# Patient Record
Sex: Female | Born: 1965
Health system: Southern US, Community
[De-identification: ages and names within clinical notes are randomized; demographics above are authoritative.]

## PROBLEM LIST (undated history)

## (undated) DIAGNOSIS — F419 Anxiety disorder, unspecified: Secondary | ICD-10-CM

## (undated) DIAGNOSIS — IMO0001 Reserved for inherently not codable concepts without codable children: Secondary | ICD-10-CM

## (undated) DIAGNOSIS — E1143 Type 2 diabetes mellitus with diabetic autonomic (poly)neuropathy: Secondary | ICD-10-CM

## (undated) DIAGNOSIS — Z9889 Other specified postprocedural states: Secondary | ICD-10-CM

## (undated) DIAGNOSIS — Z794 Long term (current) use of insulin: Secondary | ICD-10-CM

## (undated) DIAGNOSIS — K3184 Gastroparesis: Secondary | ICD-10-CM

## (undated) DIAGNOSIS — G629 Polyneuropathy, unspecified: Secondary | ICD-10-CM

## (undated) DIAGNOSIS — E785 Hyperlipidemia, unspecified: Secondary | ICD-10-CM

## (undated) DIAGNOSIS — G459 Transient cerebral ischemic attack, unspecified: Secondary | ICD-10-CM

## (undated) DIAGNOSIS — R51 Headache: Secondary | ICD-10-CM

## (undated) DIAGNOSIS — F17200 Nicotine dependence, unspecified, uncomplicated: Secondary | ICD-10-CM

## (undated) DIAGNOSIS — E119 Type 2 diabetes mellitus without complications: Secondary | ICD-10-CM

## (undated) HISTORY — DX: Reserved for inherently not codable concepts without codable children: IMO0001

## (undated) HISTORY — DX: Type 2 diabetes mellitus without complications: E11.9

## (undated) HISTORY — PX: OTHER SURGICAL HISTORY: SHX169

## (undated) HISTORY — DX: Nicotine dependence, unspecified, uncomplicated: F17.200

## (undated) HISTORY — DX: Gastroparesis: K31.84

## (undated) HISTORY — DX: Long term (current) use of insulin: Z79.4

## (undated) HISTORY — DX: Anxiety disorder, unspecified: F41.9

## (undated) HISTORY — DX: Type 2 diabetes mellitus with diabetic autonomic (poly)neuropathy: E11.43

## (undated) HISTORY — DX: Hyperlipidemia, unspecified: E78.5

## (undated) HISTORY — DX: Other specified postprocedural states: Z98.890

---

## 1999-11-16 ENCOUNTER — Encounter: Payer: Self-pay | Admitting: *Deleted

## 1999-11-16 ENCOUNTER — Encounter: Payer: Self-pay | Admitting: Emergency Medicine

## 2001-03-17 ENCOUNTER — Emergency Department (HOSPITAL_COMMUNITY): Admission: EM | Admit: 2001-03-17 | Discharge: 2001-03-17 | Payer: Self-pay | Admitting: *Deleted

## 2001-03-19 ENCOUNTER — Emergency Department (HOSPITAL_COMMUNITY): Admission: EM | Admit: 2001-03-19 | Discharge: 2001-03-19 | Payer: Self-pay | Admitting: Emergency Medicine

## 2001-12-25 ENCOUNTER — Emergency Department (HOSPITAL_COMMUNITY): Admission: EM | Admit: 2001-12-25 | Discharge: 2001-12-26 | Payer: Self-pay | Admitting: Emergency Medicine

## 2002-06-07 ENCOUNTER — Emergency Department (HOSPITAL_COMMUNITY): Admission: EM | Admit: 2002-06-07 | Discharge: 2002-06-07 | Payer: Self-pay | Admitting: Emergency Medicine

## 2002-06-07 ENCOUNTER — Encounter: Payer: Self-pay | Admitting: Emergency Medicine

## 2002-08-25 ENCOUNTER — Ambulatory Visit (HOSPITAL_COMMUNITY): Admission: RE | Admit: 2002-08-25 | Discharge: 2002-08-25 | Payer: Self-pay | Admitting: Unknown Physician Specialty

## 2002-08-25 ENCOUNTER — Encounter: Payer: Self-pay | Admitting: Family Medicine

## 2003-06-29 ENCOUNTER — Ambulatory Visit (HOSPITAL_COMMUNITY): Admission: RE | Admit: 2003-06-29 | Discharge: 2003-06-29 | Payer: Self-pay | Admitting: Family Medicine

## 2003-06-29 ENCOUNTER — Encounter: Payer: Self-pay | Admitting: Family Medicine

## 2003-12-26 ENCOUNTER — Ambulatory Visit (HOSPITAL_COMMUNITY): Admission: RE | Admit: 2003-12-26 | Discharge: 2003-12-26 | Payer: Self-pay | Admitting: Family Medicine

## 2004-08-29 ENCOUNTER — Ambulatory Visit (HOSPITAL_COMMUNITY): Admission: RE | Admit: 2004-08-29 | Discharge: 2004-08-29 | Payer: Self-pay | Admitting: Family Medicine

## 2005-03-06 ENCOUNTER — Ambulatory Visit: Payer: Self-pay | Admitting: Family Medicine

## 2005-07-24 ENCOUNTER — Ambulatory Visit: Payer: Self-pay | Admitting: Family Medicine

## 2005-07-26 ENCOUNTER — Emergency Department (HOSPITAL_COMMUNITY): Admission: EM | Admit: 2005-07-26 | Discharge: 2005-07-26 | Payer: Self-pay | Admitting: Emergency Medicine

## 2005-08-01 ENCOUNTER — Ambulatory Visit: Payer: Self-pay | Admitting: Family Medicine

## 2005-08-02 ENCOUNTER — Ambulatory Visit: Payer: Self-pay | Admitting: Family Medicine

## 2005-09-30 ENCOUNTER — Ambulatory Visit: Payer: Self-pay | Admitting: Family Medicine

## 2006-01-16 ENCOUNTER — Ambulatory Visit: Payer: Self-pay | Admitting: Family Medicine

## 2006-01-26 ENCOUNTER — Emergency Department (HOSPITAL_COMMUNITY): Admission: EM | Admit: 2006-01-26 | Discharge: 2006-01-26 | Payer: Self-pay | Admitting: Emergency Medicine

## 2006-07-08 ENCOUNTER — Ambulatory Visit: Payer: Self-pay | Admitting: Family Medicine

## 2006-12-01 ENCOUNTER — Ambulatory Visit: Payer: Self-pay | Admitting: Family Medicine

## 2007-04-28 ENCOUNTER — Ambulatory Visit: Payer: Self-pay | Admitting: Family Medicine

## 2007-05-27 ENCOUNTER — Ambulatory Visit: Payer: Self-pay | Admitting: Family Medicine

## 2007-06-05 ENCOUNTER — Ambulatory Visit (HOSPITAL_COMMUNITY): Admission: RE | Admit: 2007-06-05 | Discharge: 2007-06-05 | Payer: Self-pay | Admitting: Family Medicine

## 2007-06-18 ENCOUNTER — Ambulatory Visit: Payer: Self-pay | Admitting: Family Medicine

## 2007-09-24 ENCOUNTER — Ambulatory Visit: Payer: Self-pay | Admitting: Family Medicine

## 2007-10-06 ENCOUNTER — Encounter: Payer: Self-pay | Admitting: Family Medicine

## 2007-10-06 LAB — CONVERTED CEMR LAB
AST: 14 units/L (ref 0–37)
Albumin: 3.9 g/dL (ref 3.5–5.2)
Basophils Absolute: 0.1 10*3/uL (ref 0.0–0.1)
CO2: 26 meq/L (ref 19–32)
Calcium: 9.1 mg/dL (ref 8.4–10.5)
Chloride: 104 meq/L (ref 96–112)
HDL: 67 mg/dL (ref >39)
LDL Cholesterol: 105 mg/dL — ABNORMAL HIGH (ref 0–99)
Lymphocytes Relative: 25 % (ref 12–46)
Neutro Abs: 6.5 10*3/uL (ref 1.7–7.7)
Platelets: 278 10*3/uL (ref 150–400)
RDW: 13.1 % (ref 11.5–15.5)
Sodium: 140 meq/L (ref 135–145)
TSH: 1.164 microintl units/mL (ref 0.350–5.50)
Total Bilirubin: 0.6 mg/dL (ref 0.3–1.2)
Total CHOL/HDL Ratio: 2.7
Total Protein: 6.6 g/dL (ref 6.0–8.3)
Triglycerides: 51 mg/dL (ref <150)

## 2008-01-05 ENCOUNTER — Ambulatory Visit: Payer: Self-pay | Admitting: Family Medicine

## 2008-04-13 DIAGNOSIS — Z794 Long term (current) use of insulin: Secondary | ICD-10-CM

## 2008-04-13 DIAGNOSIS — F17200 Nicotine dependence, unspecified, uncomplicated: Secondary | ICD-10-CM | POA: Insufficient documentation

## 2008-04-13 DIAGNOSIS — IMO0001 Reserved for inherently not codable concepts without codable children: Secondary | ICD-10-CM | POA: Insufficient documentation

## 2008-04-13 DIAGNOSIS — E1165 Type 2 diabetes mellitus with hyperglycemia: Secondary | ICD-10-CM

## 2008-04-13 DIAGNOSIS — F411 Generalized anxiety disorder: Secondary | ICD-10-CM

## 2008-04-13 DIAGNOSIS — E785 Hyperlipidemia, unspecified: Secondary | ICD-10-CM | POA: Insufficient documentation

## 2008-04-19 ENCOUNTER — Encounter: Payer: Self-pay | Admitting: Family Medicine

## 2008-04-19 ENCOUNTER — Ambulatory Visit: Payer: Self-pay | Admitting: Family Medicine

## 2008-04-19 LAB — CONVERTED CEMR LAB: Hgb A1c MFr Bld: 10.3 %

## 2008-06-14 ENCOUNTER — Encounter: Payer: Self-pay | Admitting: Family Medicine

## 2008-06-14 ENCOUNTER — Ambulatory Visit: Payer: Self-pay | Admitting: Family Medicine

## 2008-06-16 DIAGNOSIS — K3184 Gastroparesis: Secondary | ICD-10-CM | POA: Insufficient documentation

## 2008-07-22 ENCOUNTER — Encounter: Payer: Self-pay | Admitting: Family Medicine

## 2008-07-26 ENCOUNTER — Encounter: Payer: Self-pay | Admitting: Family Medicine

## 2008-08-01 ENCOUNTER — Encounter: Payer: Self-pay | Admitting: Family Medicine

## 2008-09-16 ENCOUNTER — Encounter: Payer: Self-pay | Admitting: Family Medicine

## 2008-10-24 ENCOUNTER — Ambulatory Visit: Payer: Self-pay | Admitting: Family Medicine

## 2008-10-24 DIAGNOSIS — Z794 Long term (current) use of insulin: Secondary | ICD-10-CM

## 2008-10-24 DIAGNOSIS — R5382 Chronic fatigue, unspecified: Secondary | ICD-10-CM

## 2008-10-24 DIAGNOSIS — E1140 Type 2 diabetes mellitus with diabetic neuropathy, unspecified: Secondary | ICD-10-CM | POA: Insufficient documentation

## 2008-10-24 DIAGNOSIS — E1165 Type 2 diabetes mellitus with hyperglycemia: Secondary | ICD-10-CM

## 2008-11-21 ENCOUNTER — Telehealth: Payer: Self-pay | Admitting: Family Medicine

## 2008-11-30 ENCOUNTER — Ambulatory Visit: Payer: Self-pay | Admitting: Family Medicine

## 2008-11-30 DIAGNOSIS — R519 Headache, unspecified: Secondary | ICD-10-CM | POA: Insufficient documentation

## 2008-11-30 DIAGNOSIS — R51 Headache: Secondary | ICD-10-CM

## 2008-11-30 LAB — CONVERTED CEMR LAB: Glucose, Bld: 242 mg/dL

## 2009-06-07 ENCOUNTER — Telehealth: Payer: Self-pay | Admitting: Family Medicine

## 2009-06-12 ENCOUNTER — Ambulatory Visit: Payer: Self-pay | Admitting: Family Medicine

## 2009-06-12 LAB — CONVERTED CEMR LAB
Glucose, Bld: 291 mg/dL
Hgb A1c MFr Bld: 10.2 %

## 2009-06-14 ENCOUNTER — Telehealth: Payer: Self-pay | Admitting: Family Medicine

## 2009-06-14 ENCOUNTER — Encounter: Payer: Self-pay | Admitting: Family Medicine

## 2009-06-14 LAB — CONVERTED CEMR LAB: Microalb Creat Ratio: 627.1 mg/g — ABNORMAL HIGH (ref 0.0–30.0)

## 2009-06-16 ENCOUNTER — Telehealth: Payer: Self-pay | Admitting: Family Medicine

## 2009-06-16 DIAGNOSIS — M25519 Pain in unspecified shoulder: Secondary | ICD-10-CM | POA: Insufficient documentation

## 2009-06-20 ENCOUNTER — Telehealth: Payer: Self-pay | Admitting: Family Medicine

## 2009-06-20 ENCOUNTER — Encounter: Payer: Self-pay | Admitting: Family Medicine

## 2009-06-21 LAB — CONVERTED CEMR LAB
ALT: 11 units/L (ref 0–35)
Alkaline Phosphatase: 61 units/L (ref 39–117)
BUN: 13 mg/dL (ref 6–23)
Basophils Absolute: 0.1 10*3/uL (ref 0.0–0.1)
Basophils Relative: 1 % (ref 0–1)
Bilirubin, Direct: 0.1 mg/dL (ref 0.0–0.3)
Cholesterol: 203 mg/dL — ABNORMAL HIGH (ref 0–200)
Creatinine, Ser: 0.78 mg/dL (ref 0.40–1.20)
Eosinophils Absolute: 0.6 10*3/uL (ref 0.0–0.7)
Eosinophils Relative: 9 % — ABNORMAL HIGH (ref 0–5)
Glucose, Bld: 164 mg/dL — ABNORMAL HIGH (ref 70–99)
HCT: 42.6 % (ref 36.0–46.0)
Hemoglobin: 13.9 g/dL (ref 12.0–15.0)
Indirect Bilirubin: 0.3 mg/dL (ref 0.0–0.9)
MCHC: 32.6 g/dL (ref 30.0–36.0)
MCV: 93.8 fL (ref 78.0–100.0)
Monocytes Absolute: 0.7 10*3/uL (ref 0.1–1.0)
Platelets: 264 10*3/uL (ref 150–400)
RDW: 13.4 % (ref 11.5–15.5)
Total Protein: 6.2 g/dL (ref 6.0–8.3)
Triglycerides: 75 mg/dL (ref <150)

## 2009-06-26 ENCOUNTER — Telehealth: Payer: Self-pay | Admitting: Family Medicine

## 2009-07-04 ENCOUNTER — Telehealth: Payer: Self-pay | Admitting: Family Medicine

## 2009-07-19 ENCOUNTER — Encounter: Payer: Self-pay | Admitting: Family Medicine

## 2009-08-21 ENCOUNTER — Telehealth: Payer: Self-pay | Admitting: Family Medicine

## 2009-10-24 ENCOUNTER — Encounter: Payer: Self-pay | Admitting: Family Medicine

## 2009-11-14 ENCOUNTER — Telehealth: Payer: Self-pay | Admitting: Family Medicine

## 2009-11-14 ENCOUNTER — Ambulatory Visit: Payer: Self-pay | Admitting: Family Medicine

## 2009-11-14 DIAGNOSIS — J209 Acute bronchitis, unspecified: Secondary | ICD-10-CM

## 2009-11-14 LAB — CONVERTED CEMR LAB
Glucose, Bld: 345 mg/dL
Hgb A1c MFr Bld: 7.9 %

## 2009-11-21 DIAGNOSIS — J019 Acute sinusitis, unspecified: Secondary | ICD-10-CM | POA: Insufficient documentation

## 2009-11-21 DIAGNOSIS — I10 Essential (primary) hypertension: Secondary | ICD-10-CM | POA: Insufficient documentation

## 2010-04-10 ENCOUNTER — Telehealth: Payer: Self-pay | Admitting: Family Medicine

## 2010-04-12 ENCOUNTER — Encounter: Payer: Self-pay | Admitting: Family Medicine

## 2010-08-09 ENCOUNTER — Emergency Department (HOSPITAL_COMMUNITY): Admission: EM | Admit: 2010-08-09 | Discharge: 2010-08-09 | Payer: Self-pay | Admitting: Emergency Medicine

## 2010-08-09 ENCOUNTER — Encounter: Payer: Self-pay | Admitting: Physician Assistant

## 2010-08-09 ENCOUNTER — Ambulatory Visit: Payer: Self-pay | Admitting: Family Medicine

## 2010-08-09 DIAGNOSIS — E1069 Type 1 diabetes mellitus with other specified complication: Secondary | ICD-10-CM

## 2010-10-10 ENCOUNTER — Telehealth: Payer: Self-pay | Admitting: Family Medicine

## 2010-10-22 ENCOUNTER — Ambulatory Visit: Payer: Self-pay | Admitting: Family Medicine

## 2010-12-01 ENCOUNTER — Encounter: Payer: Self-pay | Admitting: Family Medicine

## 2010-12-02 ENCOUNTER — Encounter: Payer: Self-pay | Admitting: Family Medicine

## 2010-12-03 ENCOUNTER — Encounter: Payer: Self-pay | Admitting: Family Medicine

## 2010-12-10 ENCOUNTER — Telehealth: Payer: Self-pay | Admitting: Family Medicine

## 2010-12-10 ENCOUNTER — Ambulatory Visit
Admission: RE | Admit: 2010-12-10 | Discharge: 2010-12-10 | Payer: Self-pay | Source: Home / Self Care | Attending: Family Medicine | Admitting: Family Medicine

## 2010-12-10 ENCOUNTER — Encounter: Payer: Self-pay | Admitting: Family Medicine

## 2010-12-10 LAB — CONVERTED CEMR LAB
CO2: 28 meq/L (ref 19–32)
Calcium: 8.8 mg/dL (ref 8.4–10.5)
Creatinine, Ser: 0.89 mg/dL (ref 0.40–1.20)
Glucose, Bld: 335 mg/dL
Glucose, Urine, Semiquant: 500
Protein, U semiquant: >=300
Sodium: 137 meq/L (ref 135–145)
Urobilinogen, UA: 1
pH: 5.5

## 2010-12-11 LAB — CONVERTED CEMR LAB
AST: 13 units/L (ref 0–37)
Albumin: 3.5 g/dL (ref 3.5–5.2)
Bilirubin, Direct: 0.1 mg/dL (ref 0.0–0.3)
Creatinine, Urine: 191.4 mg/dL
Eosinophils Relative: 2 % (ref 0–5)
HCT: 42.5 % (ref 36.0–46.0)
HDL: 70 mg/dL (ref >39)
Hemoglobin: 13.8 g/dL (ref 12.0–15.0)
Hgb A1c MFr Bld: 9.6 % — ABNORMAL HIGH (ref <5.7)
LDL Cholesterol: 158 mg/dL — ABNORMAL HIGH (ref 0–99)
Lymphocytes Relative: 22 % (ref 12–46)
Lymphs Abs: 1.8 10*3/uL (ref 0.7–4.0)
Monocytes Absolute: 0.6 10*3/uL (ref 0.1–1.0)
Monocytes Relative: 8 % (ref 3–12)
Neutro Abs: 5.5 10*3/uL (ref 1.7–7.7)
RBC: 4.64 M/uL (ref 3.87–5.11)
TSH: 1.388 microintl units/mL (ref 0.350–4.500)
Total Bilirubin: 0.5 mg/dL (ref 0.3–1.2)
Total CHOL/HDL Ratio: 3.6
VLDL: 21 mg/dL (ref 0–40)

## 2010-12-11 NOTE — Assessment & Plan Note (Signed)
Summary: CONG. LYMPH NODES SWOLLEN IN NECK   Vital Signs:  Patient profile:   45 year old female Height:      66.5 inches Weight:      116.75 pounds BMI:     18.63 O2 Sat:      97 % Pulse rate:   91 / minute Pulse rhythm:   regular Resp:     16 per minute BP sitting:   130 / 84 Cuff size:   small  Vitals Entered By: Everitt Amber (November 14, 2009 2:46 PM) CC: Nose was bleeding the other morning and been having drainage, lymph nodes in throat are swollen Is Patient Diabetic? Yes   Primary Care Provider:  Syliva Overman MD  CC:  Nose was bleeding the other morning and been having drainage and lymph nodes in throat are swollen.  History of Present Illness: Pt reports a 3 week h/o worsening respiratory symptoms, primarily pain and pressure in the face with green nasal drainage, epistaxis and intermittent fever and chills. She also c/o tender swollen neck glands. She states she is doing much better with her diabetes, ating better, taking meds regularly, checking sugars . She still has nt had a mamogram and has cancelled multiple referrals.States sjhe will go this time.  Preventive Screening-Counseling & Management  Alcohol-Tobacco     Smoking Status: current     Smoking Cessation Counseling: yes     Packs/Day: 0.5  Allergies: No Known Drug Allergies  Social History: Packs/Day:  0.5  Review of Systems General:  Complains of chills and fatigue; denies fever. Eyes:  Complains of blurring; denies discharge and red eye. ENT:  Complains of nasal congestion, postnasal drainage, and sinus pressure; 3 week history of head congestion, dizzy, low grade tenmp, states she had alot  of epistaxis from  both nostrils,leftt greater than right. CV:  Denies chest pain or discomfort, palpitations, and swelling of feet. Resp:  Complains of cough and sputum productive; denies shortness of breath and wheezing. GI:  Denies abdominal pain, constipation, diarrhea, nausea, and vomiting. GU:   Denies dysuria and urinary frequency. MS:  Denies joint pain. Neuro:  Denies headaches, seizures, sensation of room spinning, and tingling. Psych:  Complains of anxiety and depression; denies suicidal thoughts/plans, thoughts of violence, unusual visions or sounds, and thoughts /plans of harming others; controlled on meds. Endo:  Complains of excessive hunger; reports improvement in her blood sugars, she has been testing 3 times daily and using her meds a s prescribed by the endocrinologist. She states that the most recent call when she was told that she had kidney damage was a wake up call for her. Heme:  Denies abnormal bruising and bleeding.  Physical Exam  General:  Well-developed,adequately nourished,in no acute distress; alert,appropriate and cooperative throughout examination HEENT: No facial asymmetry,  EOMI, Frontal and maxillary  sinus tenderness, TM's Clear, oropharynx  pink and moist. Bilateral anterior cervical adenitis  Chest:Few scattered crackles, decreased air entry bilaterally CVS: S1, S2, No murmurs, No S3.   Abd: Soft, Nontender.  MS: Adequate ROM spine, hips, shoulders and knees.  Ext: No edema.   CNS: CN 2-12 intact, power tone and sensation normal throughout.   Skin: Intact, no visible lesions or rashes.  Psych: Good eye contact, normal affect.  Memory intact, not anxious or depressed appearing.    Impression & Recommendations:  Problem # 1:  ACUTE BRONCHITIS (ICD-466.0) Assessment Comment Only  Her updated medication list for this problem includes:    Proventil Hfa  108 (90 Base) Mcg/act Aers (Albuterol sulfate) .Marland Kitchen..Marland Kitchen Two puffs every 6 to 8 hours prn    Veetids 500 Mg Tabs (Penicillin v potassium) .Marland Kitchen... Take 1 tablet by mouth three times a day  Orders: Rocephin  250mg  (V7846) Admin of Therapeutic Inj  intramuscular or subcutaneous (96295)  Problem # 2:  SHOULDER PAIN, RIGHT (ICD-719.41) Assessment: Comment Only  Her updated medication list for this  problem includes:    Ibuprofen 800 Mg Tabs (Ibuprofen) .Marland Kitchen... Take 1 tablet by mouth once a day as needed  Problem # 3:  HYPERLIPIDEMIA (ICD-272.4) Assessment: Comment Only  Her updated medication list for this problem includes:    Lovastatin 40 Mg Tabs (Lovastatin) .Marland Kitchen... Take 1 tab by mouth at bedtime  Orders: T-Lipid Profile [phone]) T-Hepatic Function [phone])  Labs Reviewed: SGOT: 18 (06/20/2009)   SGPT: 11 (06/20/2009)   HDL:69 (06/20/2009), 67 (10/06/2007)  LDL:119 (06/20/2009), 105 (10/06/2007)  Chol:203 (06/20/2009), 182 (10/06/2007)  Trig:75 (06/20/2009), 51 (10/06/2007)  Problem # 4:  DIABETES MELLITUS, WITH RENAL COMPLICATIONS (ICD-250.40) Assessment: Improved  Her updated medication list for this problem includes:    Novolog 100 Unit/ml Soln (Insulin aspart) .Marland Kitchen... For sliding scale coverage for breakfast and lunch only    Humalog Mix 75/25 Kwikpen 75-25 % Susp (Insulin lispro prot & lispro) .Marland KitchenMarland KitchenMarland KitchenMarland Kitchen 35 units bid  Orders: Glucose, (CBG) (82962) Hemoglobin A1C (83036)  Labs Reviewed: Creat: 0.78 (06/20/2009)    Reviewed HgBA1c results: 7.9 (11/14/2009)  10.2 (06/12/2009)  Problem # 5:  NICOTINE ADDICTION (ICD-305.1) Assessment: Improved  Encouraged smoking cessation and discussed different methods for smoking cessation. currently smoking 10/day and wants to quit  Problem # 6:  GENERALIZED ANXIETY DISORDER (ICD-300.02) Assessment: Improved  Her updated medication list for this problem includes:    Klonopin 0.5 Mg Tabs (Clonazepam) .Marland Kitchen... Take 1 tab by mouth at bedtime    Cymbalta 60 Mg Cpep (Duloxetine hcl) ..... One tab by mouth bid  Problem # 7:  HYPERTENSION (ICD-401.9) Assessment: Comment Only  BP today: 130/84 Prior BP: 90/60 (06/12/2009)  Labs Reviewed: K+: 4.5 (06/20/2009) Creat: : 0.78 (06/20/2009)   Chol: 203 (06/20/2009)   HDL: 69 (06/20/2009)   LDL: 119 (06/20/2009)   TG: 75 (06/20/2009) currently on an ACE inhibitor, she is to continue  same  Problem # 8:  HYPERLIPIDEMIA (ICD-272.4)  Her updated medication list for this problem includes:    Lovastatin 40 Mg Tabs (Lovastatin) .Marland Kitchen... Take 1 tab by mouth at bedtime  Orders: T-Lipid Profile ([id number]) T-Hepatic Function [phone])  Complete Medication List: 1)  Klonopin 0.5 Mg Tabs (Clonazepam) .... Take 1 tab by mouth at bedtime 2)  Metanx 2.8-25-2 Mg Tabs (L-methylfolate-b6-b12) .... One tab by mouth qd 3)  Cymbalta 60 Mg Cpep (Duloxetine hcl) .... One tab by mouth bid 4)  Onetouch Test Strp (Glucose blood) .... Uad 5)  Test Strips For One Touch Ultra Mini  6)  Novolog 100 Unit/ml Soln (Insulin aspart) .... For sliding scale coverage for breakfast and lunch only 7)  Proventil Hfa 108 (90 Base) Mcg/act Aers (Albuterol sulfate) .... Two puffs every 6 to 8 hours prn 8)  Bd Ultra-fine Pen Needles 29g X 12.48mm Misc (Insulin pen needle) .... Uad 9)  Thinpro Insulin Syringe 31g X 3/8" 1 Ml Misc (Insulin syringe-needle u-100) .... Uad 10)  Humalog Mix 75/25 Kwikpen 75-25 % Susp (Insulin lispro prot & lispro) .... 35 units bid 11)  Ibuprofen 800 Mg Tabs (Ibuprofen) .... Take 1 tablet by mouth once a day as needed  12)  Lovastatin 40 Mg Tabs (Lovastatin) .... Take 1 tab by mouth at bedtime 13)  Veetids 500 Mg Tabs (Penicillin v potassium) .... Take 1 tablet by mouth three times a day  Other Orders: T-Basic Metabolic Panel [phone]) T- Hemoglobin A1C ([id number]) Radiology Referral (Radiology)  Patient Instructions: 1)  Please schedule a follow-up appointment in 4 months. 2)  Tobacco is very bad for your health and your loved ones! You Should stop smoking!. 3)  Stop Smoking Tips: Choose a Quit date. Cut down before the Quit date. decide what you will do as a substitute when you feel the urge to smoke(gum,toothpick,exercise). 4)  you are being treated for sinusitis. you will get an injection of rocephin and meds are being sent in also. 5)  i am very proud of your   blood sugar. 6)  mAMO you will also do!!! Prescriptions: VEETIDS 500 MG TABS (PENICILLIN V POTASSIUM) Take 1 tablet by mouth three times a day  #30 x 0   Entered and Authorized by:   Syliva Overman MD   Signed by:   Syliva Overman MD on 11/14/2009   Method used:   Electronically to        [facility] [address] [phone]* (retail)       [address] [place], [place]  [address]       Ph: [phone]       Fax: [phone]   RxID:   [id number]   Laboratory Results   Blood Tests   Date/Time Received: November 14, 2009  Date/Time Reported: November 14, 2009   Glucose (random): 345 mg/dL   (Normal Range: 59-563) HGBA1C: 7.9%   (Normal Range: Non-Diabetic - 3-6%   Control Diabetic - 6-8%)      Medication Administration  Injection # 1:    Medication: Rocephin  250mg     Diagnosis: ACUTE BRONCHITIS (ICD-466.0)    Route: IM    Site: RUOQ gluteus    Exp Date: 4/10    Lot #: [id number]    Mfr:  [email or web address]    Comments: rocephin 500mg  given    Patient tolerated injection without complications    Given by: Worthy Keeler LPN (November 14, 2009 4:16 PM)  Orders Added: 1)  Glucose, (CBG) [82962] 2)  Hemoglobin A1C [83036] 3)  T-Basic Metabolic Panel [[id number]] 4)  T-Lipid Profile [80061-22930] 5)  T-Hepatic Function [80076-22960] 6)  T- Hemoglobin A1C [83036-23375] 7)  Radiology Referral [Radiology] 8)  Rocephin  250mg  [J0696] 9)  Admin of Therapeutic Inj  intramuscular or subcutaneous [96372] 10)  Est. Patient Level V [32951]  Appended Document: CONG. LYMPH NODES SWOLLEN IN NECK coded at a level 5 since late so chg will driop down to a level 4 which is the correct level, thanks

## 2010-12-11 NOTE — Letter (Signed)
Summary: Out of Work  [facility]  [address]   [place], [place] [address]   Phone: [phone]  Fax: [phone]    November 14, 2009   Employee:  [employer]    To Whom It May Concern:   For Medical reasons, please excuse the above named employee from work for the following dates:  Start:   11/14/09  End:   11/15/09 to return with no restrictions  If you need additional information, please feel free to contact our office.         Sincerely,    Worthy Keeler LPN

## 2010-12-11 NOTE — Assessment & Plan Note (Signed)
Summary: sugar bottomed out- room 3   Vital Signs:  Patient profile:   45 year old female Height:      66.5 inches Weight:      120.25 pounds BMI:     19.19 O2 Sat:      100 % on Room air Pulse rate:   82 / minute Resp:     16 per minute BP sitting:   110 / 62  (left arm)  Vitals Entered By: Adella Hare LPN (August 09, 2010 11:26 AM) CC: patient states sugar dropped to 46 this morning at work, had episode of shakes, sweats, disoriented Is Patient Diabetic? Yes Did you bring your meter with you today? No Pain Assessment Patient in pain? no      CBG Result 225 Comments did not bring meds to ov   Primary Provider:  Syliva Overman MD  CC:  patient states sugar dropped to 46 this morning at work, had episode of shakes, sweats, and disoriented.  History of Present Illness: Pt states she had an episode of hypoglycemia this am at work.  See's endocrinologist for diabetes.  Uses Lantus 15 units q am, and sliding scale Novolog.  States has been having problems with blood sugars dropping at work.  "I do alot of physical work."  She has discussed this with her endocrinologist.  FBS avg 160s and evening sugars at bedtime avg 180s.  Does sometimes awaken during night low too. Pt states when her blood sugars started to come back up she was feeling dizzy and is worried that she has an ear infection.  See's endocrinologist every 3 mos.  Next appt in October.    Allergies (verified): No Known Drug Allergies  Past History:  Past medical history reviewed for relevance to current acute and chronic problems.  Past Medical History: Reviewed history from 10/24/2008 and no changes required.   NICOTINE ADDICTION (ICD-305.1) GENERALIZED ANXIETY DISORDER (ICD-300.02) IDDM (ICD-250.01)  with retinopathy. DYSLIPIDEMIA (ICD-272.4)  Review of Systems General:  Denies chills and fever. ENT:  Denies earache, nasal congestion, sinus pressure, and sore throat. CV:  Denies chest pain or  discomfort. Resp:  Denies cough and shortness of breath. GI:  Denies abdominal pain, diarrhea, and vomiting. GU:  Denies dysuria and urinary frequency.  Physical Exam  General:  Well-developed,well-nourished,in no acute distress; alert,appropriate and cooperative throughout examination Head:  Normocephalic and atraumatic without obvious abnormalities. No apparent alopecia or balding. Eyes:  pupils equal, pupils round, pupils reactive to light, and no injection.   Ears:  External ear exam shows no significant lesions or deformities.  Otoscopic examination reveals clear canals, tympanic membranes are intact bilaterally without bulging, retraction, inflammation or discharge. Hearing is grossly normal bilaterally. Nose:  External nasal examination shows no deformity or inflammation. Nasal mucosa are pink and moist without lesions or exudates. Mouth:  Oral mucosa and oropharynx without lesions or exudates.  Teeth in good repair. Neck:  No deformities, masses, or tenderness noted. Lungs:  Normal respiratory effort, chest expands symmetrically. Lungs are clear to auscultation, no crackles or wheezes. Heart:  Normal rate and regular rhythm. S1 and S2 normal without gallop, murmur, click, rub or other extra sounds. Cervical Nodes:  No lymphadenopathy noted Psych:  Oriented X3, memory intact for recent and remote, normally interactive, good eye contact, not anxious appearing, and not depressed appearing.     Impression & Recommendations:  Problem # 1:  DIABETIC HYPOGLYCEMIA, TYPE I (ICD-250.81) Assessment Comment Only Advised pt that she needs to f/u with  her endocrinologist regarding her frequent hypoglycemic episodes at work.  Her updated medication list for this problem includes:    Novolog 100 Unit/ml Soln (Insulin aspart) .Marland Kitchen... For sliding scale coverage for breakfast and lunch only    Humalog Mix 75/25 Kwikpen 75-25 % Susp (Insulin lispro prot & lispro) .Marland KitchenMarland KitchenMarland KitchenMarland Kitchen 35 units bid  Problem # 2:   HYPERTENSION (ICD-401.9) Assessment: Comment Only  BP today: 110/62 Prior BP: 130/84 (11/14/2009)  Labs Reviewed: K+: 4.5 (06/20/2009) Creat: : 0.78 (06/20/2009)   Chol: 203 (06/20/2009)   HDL: 69 (06/20/2009)   LDL: 119 (06/20/2009)   TG: 75 (06/20/2009)  Complete Medication List: 1)  Klonopin 0.5 Mg Tabs (Clonazepam) .... Take 1 tab by mouth at bedtime 2)  Metanx 2.8-25-2 Mg Tabs (L-methylfolate-b6-b12) .... One tab by mouth qd 3)  Cymbalta 60 Mg Cpep (Duloxetine hcl) .... One tab by mouth bid 4)  Onetouch Test Strp (Glucose blood) .... Uad 5)  Test Strips For One Touch Ultra Mini  6)  Novolog 100 Unit/ml Soln (Insulin aspart) .... For sliding scale coverage for breakfast and lunch only 7)  Proventil Hfa 108 (90 Base) Mcg/act Aers (Albuterol sulfate) .... Two puffs every 6 to 8 hours prn 8)  Bd Ultra-fine Pen Needles 29g X 12.18mm Misc (Insulin pen needle) .... Uad 9)  Thinpro Insulin Syringe 31g X 3/8" 1 Ml Misc (Insulin syringe-needle u-100) .... Uad 10)  Humalog Mix 75/25 Kwikpen 75-25 % Susp (Insulin lispro prot & lispro) .... 35 units bid 11)  Ibuprofen 800 Mg Tabs (Ibuprofen) .... Take 1 tablet by mouth once a day as needed 12)  Lovastatin 40 Mg Tabs (Lovastatin) .... Take 1 tab by mouth at bedtime 13)  Veetids 500 Mg Tabs (Penicillin v potassium) .... Take 1 tablet by mouth three times a day  Other Orders: Glucose, (CBG) ([address]) Influenza Vaccine NON MCR (60454)  Patient Instructions: 1)  Follow up with your endocrinologist as planned. 2)  After drinking soda to bring your sugar up, eat some protein to help keep you from dropping again. 3)  You have received you flu shot today.  Laboratory Results   Blood Tests   Date/Time Received: August 09, 2010 11:28 AM  Date/Time Reported: August 09, 2010 11:28 AM   CBG Random:: 225       Influenza Vaccine    Vaccine Type: Fluvax Non-MCR    Site: right deltoid    Mfr: [email or web address]    Dose: 0.5 ml    Route: IM     Given by: Adella Hare LPN    Exp. Date: 03/2011    Lot #: [id number]    VIS given: 06/05/10 version given August 09, 2010.

## 2010-12-11 NOTE — Progress Notes (Signed)
Summary: please advise  Phone Note Call from Patient   Summary of Call: patient called in states her sugars are out of wack, and she is congestive and just not feeling good, she wanted to know if you would call in antibiotic for her, I told her I didn't think so since it has been several months since you seen her.   She wanted to know what she take for the congestion. Initial call taken by: Curtis Sites,  October 10, 2010 3:36 PM  Follow-up for Phone Call        advise sugar free/diabeti robitussin, offer morning appt Follow-up by: Syliva Overman MD,  October 10, 2010 6:12 PM  Additional Follow-up for Phone Call Additional follow up Details #1::        called patient, left message Additional Follow-up by: Adella Hare LPN,  October 12, 2010 4:18 PM     Appended Document: please advise patient states she is feeling alot better

## 2010-12-11 NOTE — Progress Notes (Signed)
  Phone Note Call from Patient   Summary of Call: Needs paper stating what her copay amount is and that Dr. Lodema Hong is her primary care doctor.  Call when ready  Initial call taken by: Everitt Amber LPN,  Apr 10, 2010 3:41 PM  Follow-up for Phone Call        Brandi gave pt profee billings number Follow-up by: Rudene Anda,  April 11, 2010 10:33 AM  Additional Follow-up for Phone Call Additional follow up Details #1::        Does she need anything further from [place]? Additional Follow-up by: Esperanza Sheets PA,  April 11, 2010 11:27 AM    Additional Follow-up for Phone Call Additional follow up Details #2::    Daughter is coming to collect paper staing what her copay is and that Lodema Hong is her PCP Follow-up by: Everitt Amber LPN,  April 11, 1609 11:54 AM

## 2010-12-11 NOTE — Progress Notes (Signed)
Summary: referral  Phone Note Outgoing Call   Call placed by: breast center  Action Taken: Appt scheduled Summary of Call: Called and made appt for pt to go lto breast center for 11/21/2009 3:50. Breast Center let me know that pt had 9 no shows. Just wanted to let doc know Initial call taken by: Rudene Anda,  November 14, 2009 3:46 PM  Follow-up for Phone Call        noted!! Follow-up by: Syliva Overman MD,  November 14, 2009 4:10 PM

## 2010-12-11 NOTE — Letter (Signed)
Summary: Work Excuse  [facility]  [address]   [place], [place] [address]   Phone: [phone]  Fax: [phone]    Today's Date: August 09, 2010  Name of Patient: Ashley Armstrong  The above named patient had a medical visit today at:  9:45 am .  Please take this into consideration when reviewing the time away from work/school.    Special Instructions:  [  ] None  [ * ] To be off the remainder of today, returning to the normal work / school schedule tomorrow.  [  ] To be off until the next scheduled appointment on ______________________.  [  ] Other ________________________________________________________________ ________________________________________________________________________   Sincerely yours,   Esperanza Sheets PA

## 2010-12-11 NOTE — Letter (Signed)
Summary: co pay  co pay   Imported By: Lind Guest 04/12/2010 09:33:44  _____________________________________________________________________  External Attachment:    Type:   Image     Comment:   External Document

## 2010-12-13 NOTE — Letter (Signed)
Summary: 1ST MISSED LETTER  1ST MISSED LETTER   Imported By: Lind Guest 10/23/2010 09:14:58  _____________________________________________________________________  External Attachment:    Type:   Image     Comment:   External Document

## 2010-12-19 NOTE — Letter (Signed)
Summary: [facility]  [facility]   Imported By: Lind Guest 12/12/2010 10:39:32  _____________________________________________________________________  External Attachment:    Type:   Image     Comment:   External Document

## 2010-12-19 NOTE — Progress Notes (Signed)
Summary: lab work  Phone Note Call from Patient   Summary of Call: called and wanted to know about her lab work and Dr. Lodema Hong said it was fine and she said she would call back tomorrow and speak to nurse or you could call her back on her cell Initial call taken by: Lind Guest,  December 10, 2010 4:59 PM  Follow-up for Phone Call        pls let pt know blood sugar is uncontrolled, giver her the values , also her cholesterol is too high , she needs to start med for this and follow a low fat diet, also inc the humalog to 40 units twice daily, pls sen in historic scripts Follow-up by: Syliva Overman MD,  December 11, 2010 12:28 PM  Additional Follow-up for Phone Call Additional follow up Details #1::        patient is only taking novolog and lantus not on humalog    Additional Follow-up for Phone Call Additional follow up Details #2::    she has a sliding scale on the novolog through her endo, she needs to make appt withendo and have them amnage the sliding scale , I do not do that Follow-up by: Syliva Overman MD,  December 11, 2010 1:55 PM  Additional Follow-up for Phone Call Additional follow up Details #3:: Details for Additional Follow-up Action Taken: called patient, left message Additional Follow-up by: Adella Hare LPN,  December 11, 2010 3:07 PM  Prescriptions: PRAVASTATIN SODIUM 80 MG TABS (PRAVASTATIN SODIUM) Take 1 tab by mouth at bedtime  #30 x 4   Entered by:   Adella Hare LPN   Authorized by:   Syliva Overman MD   Signed by:   Adella Hare LPN on 16/08/9603   Method used:   Electronically to        [facility] [address] [phone]* (retail)       [address] [place], [place]  [address]       Ph: [phone]       Fax: [phone]   RxID:   [id number]  patient aware Adella Hare LPN  December 12, 2010 10:36 AM

## 2010-12-19 NOTE — Assessment & Plan Note (Signed)
Summary: VOMITING/DIABETIC/SLJ   Vital Signs:  Patient profile:   45 year old female Height:      66.5 inches Weight:      118.25 pounds BMI:     18.87 O2 Sat:      98 % Pulse rate:   87 / minute Pulse rhythm:   regular Resp:     16 per minute BP sitting:   102 / 60  (left arm)  Vitals Entered By: Everitt Amber LPN (December 10, 2010 9:31 AM) CC: was throwing up over the weekend/diarrhea, bad headache, no appetite. Eyesight is off making her feel nauseated still   Primary Care Provider:  Syliva Overman MD  CC:  was throwing up over the weekend/diarrhea, bad headache, and no appetite. Eyesight is off making her feel nauseated still.  History of Present Illness: Ptn was in her usual state of health up until 3 days ago, she started experiencing severe midline splitting headache rated at a 10 with a burning sensation, vomitting std 2 days later. headache was like a migraine which she has had in the past, bC powder helped this, sleep afforded relief. At times she felt as though she was "in and out " of it.She did have an aura of a metal taste in her mouth before the headache which cleared yesterday. Pt still is experiencincing no apetitie, nausea abd had 4 loose stools yesterday, no other sick contact with diarreah. Has lower abd pain, spotting last week  Preventive Screening-Counseling & Management  Alcohol-Tobacco     Smoking Cessation Counseling: yes  Allergies: No Known Drug Allergies  Review of Systems      See HPI General:  Complains of chills and fatigue. Eyes:  Complains of vision loss-both eyes. ENT:  Denies hoarseness, nasal congestion, and postnasal drainage. CV:  Denies chest pain or discomfort, difficulty breathing while lying down, palpitations, and swelling of feet. Resp:  Denies cough and sputum productive. GU:  Denies dysuria. MS:  Denies joint pain and stiffness. Derm:  Complains of lesion(s). Psych:  Complains of anxiety, irritability, and mental problems;  denies suicidal thoughts/plans, thoughts of violence, and unusual visions or sounds; reports max stress wants therapy. Endo:  Complains of excessive thirst and excessive urination; widely fluctuating blood sugars, inconsitent testing. Heme:  Denies abnormal bruising and bleeding. Allergy:  Denies hives or rash, itching eyes, and seasonal allergies.  Physical Exam  General:  Pleasant female,adequately-nourished,in no acute distress; alert,appropriate and cooperative throughout examination. Chronically ill appearing HEENT: No facial asymmetry,  EOMI, No sinus tenderness, TM's Clear, oropharynx  pink and moist.   Chest: Clear to auscultation bilaterally.  CVS: S1, S2, No murmurs, No S3.   Abd: Soft, diffuse superficial tenderness, and hyperactive bowel sounds MS: Adequate ROM spine, hips, shoulders and knees.  Ext: No edema.   CNS: CN 2-12 intact, power tone and sensation normal throughout.   Skin: Intact, no visible lesions or rashes.  Psych: Good eye contact, normal affect.  Memory intact, not anxious or depressed appearing.    Impression & Recommendations:  Problem # 1:  GENERALIZED ANXIETY DISORDER (ICD-300.02) Assessment Deteriorated referral to therapy  Problem # 2:  HEADACHE (ICD-784.0) Assessment: Deteriorated  Her updated medication list for this problem includes:    Ibuprofen 800 Mg Tabs (Ibuprofen) .Marland Kitchen... Take 1 tablet by mouth once a day as needed  Problem # 3:  DIABETES MELLITUS, WITH RENAL COMPLICATIONS (ICD-250.40) Assessment: Deteriorated  The following medications were removed from the medication list:    Humalog Mix  75/25 Kwikpen 75-25 % Susp (Insulin lispro prot & lispro) .Marland KitchenMarland KitchenMarland KitchenMarland Kitchen 35 units bid Her updated medication list for this problem includes:    Novolog 100 Unit/ml Soln (Insulin aspart) .Marland Kitchen... For sliding scale coverage for breakfast and lunch only    Humalog Mix 75/25 Kwikpen 75-25 % Susp (Insulin lispro prot & lispro) .Marland KitchenMarland KitchenMarland KitchenMarland Kitchen 40 units twice daily, dose inc  effective 12/11/2010  Labs Reviewed: Creat: 0.78 (06/20/2009)    Reviewed HgBA1c results: 7.9 (11/14/2009)  10.2 (06/12/2009)  Problem # 4:  DYSLIPIDEMIA (ICD-272.4) Assessment: Comment Only  The following medications were removed from the medication list:    Lovastatin 40 Mg Tabs (Lovastatin) .Marland Kitchen... Take 1 tab by mouth at bedtime Her updated medication list for this problem includes:    Pravastatin Sodium 80 Mg Tabs (Pravastatin sodium) .Marland Kitchen... Take 1 tab by mouth at bedtime Low fat dietdiscussed and encouraged  Labs Reviewed: SGOT: 18 (06/20/2009)   SGPT: 11 (06/20/2009)   HDL:69 (06/20/2009), 67 (10/06/2007)  LDL:119 (06/20/2009), 105 (10/06/2007)  Chol:203 (06/20/2009), 182 (10/06/2007)  Trig:75 (06/20/2009), 51 (10/06/2007)  Complete Medication List: 1)  Klonopin 0.5 Mg Tabs (Clonazepam) .... Take 1 tab by mouth at bedtime 2)  Metanx 2.8-25-2 Mg Tabs (L-methylfolate-b6-b12) .... One tab by mouth qd 3)  Cymbalta 60 Mg Cpep (Duloxetine hcl) .... One tab by mouth bid 4)  Onetouch Test Strp (Glucose blood) .... Uad 5)  Test Strips For One Touch Ultra Mini  6)  Novolog 100 Unit/ml Soln (Insulin aspart) .... For sliding scale coverage for breakfast and lunch only 7)  Proventil Hfa 108 (90 Base) Mcg/act Aers (Albuterol sulfate) .... Two puffs every 6 to 8 hours prn 8)  Bd Ultra-fine Pen Needles 29g X 12.59mm Misc (Insulin pen needle) .... Uad 9)  Thinpro Insulin Syringe 31g X 3/8" 1 Ml Misc (Insulin syringe-needle u-100) .... Uad 10)  Ibuprofen 800 Mg Tabs (Ibuprofen) .... Take 1 tablet by mouth once a day as needed 11)  Veetids 500 Mg Tabs (Penicillin v potassium) .... Take 1 tablet by mouth three times a day 12)  Pravastatin Sodium 80 Mg Tabs (Pravastatin sodium) .... Take 1 tab by mouth at bedtime 13)  Humalog Mix 75/25 Kwikpen 75-25 % Susp (Insulin lispro prot & lispro) .... 40 units twice daily, dose inc effective 12/11/2010  Other Orders: T-Basic Metabolic Panel  ([id number]) T-Hepatic Function [phone]) T-Lipid Profile [phone]) T-CBC w/Diff [phone]) T-TSH [phone]) T- Hemoglobin A1C (16606-30160) T-Urine Microalbumin w/creat. ratio [phone]) Urinalysis (81003-65000) Glucose, (CBG) (54270) Psychology Referral (Psychology)  Patient Instructions: 1)  Please schedule a follow-up appointment in 1 month. 2)  BMP prior to visit, ICD-9: 3)  Hepatic Panel prior to visit, ICD-9: 4)  Lipid Panel prior to visit, ICD-9:  today. 5)  TSH prior to visit, ICD-9: 6)  CBC w/ Diff prior to visit, ICD-9: 7)  HbgA1C prior to visit, ICD-9: 8)  microalb from the office today  and cCUA today 9)  Schedule your mammogram. 10)  pls keep appt with the endocrinologist 11)  You will be referred to behav health for therapy 12)  Oral Rehydration Solution: drink 1/2 ounce every 15 minutes. If tolerated afert 1 hour, drink 1 ounce every 15 minutes. As you can tolerate, keep adding 1/2 ounce every 15 minutes, up to a total of 2-4 ounces. Contact the office if unable to tolerate oral solution, if you keep vomiting, or you continue to have signs of dehydration. 13)  Tobacco is very bad for your health and your loved ones! You  Should stop smoking!. 14)  Stop Smoking Tips: Choose a Quit date. Cut down before the Quit date. decide what you will do as a substitute when you feel the urge to smoke(gum,toothpick,exercise). Prescriptions: HUMALOG MIX 75/25 KWIKPEN 75-25 % SUSP (INSULIN LISPRO PROT & LISPRO) 40 units twice daily, dose inc effective 12/11/2010  #2400 units x 3   Entered and Authorized by:   Syliva Overman MD   Signed by:   Syliva Overman MD on 12/11/2010   Method used:   Historical   RxID:   [id number] PRAVASTATIN SODIUM 80 MG TABS (PRAVASTATIN SODIUM) Take 1 tab by mouth at bedtime  #30 x 4   Entered and Authorized by:   Syliva Overman MD   Signed by:   Syliva Overman MD on 12/11/2010   Method used:   Historical   RxID:    [id number]    Orders Added: 1)  Est. Patient Level IV [40102] 2)  T-Basic Metabolic Panel [[id number]] 3)  T-Hepatic Function [47425-95638] 4)  T-Lipid Profile [80061-22930] 5)  T-CBC w/Diff [75643-32951] 6)  T-TSH [88416-60630] 7)  T- Hemoglobin A1C [83036-23375] 8)  T-Urine Microalbumin w/creat. ratio [82043-[address]] 9)  Urinalysis [81003-65000] 10)  Glucose, (CBG) [82962] 11)  Psychology Referral [Psychology]    Laboratory Results   Urine Tests  Date/Time Received: December 10, 2010 3:49 PM  Date/Time Reported: December 10, 2010 3:49 PM   Routine Urinalysis   Color: yellow Appearance: Clear Glucose: 500   (Normal Range: Negative) Bilirubin: small   (Normal Range: Negative) Ketone: smal (15)   (Normal Range: Negative) Spec. Gravity: >=1.030   (Normal Range: 1.003-1.035) Blood: moderate   (Normal Range: Negative) pH: 5.5   (Normal Range: 5.0-8.0) Protein: >=300   (Normal Range: Negative) Urobilinogen: 1.0   (Normal Range: 0-1) Nitrite: negative   (Normal Range: Negative) Leukocyte Esterace: negative   (Normal Range: Negative)     Blood Tests   Date/Time Received: December 10, 2010 3:50 PM  Date/Time Reported: December 10, 2010 3:50 PM   Glucose (random): 335 mg/dL   (Normal Range: 16-010)

## 2010-12-19 NOTE — Letter (Signed)
Summary: Out of Work  [facility]  [address]   [place], [place] [address]   Phone: [phone]  Fax: [phone]    December 10, 2010   Employee:  Ashley Armstrong    To Whom It May Concern:   For Medical reasons, please excuse the above named employee from work for the following dates:  Start:   12/10/10  End:   12/12/10 to return with no restrictions  If you need additional information, please feel free to contact our office.         Sincerely,    Milus Mallick. Lodema Hong, MD

## 2011-01-10 ENCOUNTER — Ambulatory Visit: Payer: Self-pay | Admitting: Family Medicine

## 2011-01-24 LAB — GLUCOSE, CAPILLARY: Glucose-Capillary: 75 mg/dL (ref 70–99)

## 2011-06-10 ENCOUNTER — Telehealth: Payer: Self-pay | Admitting: Family Medicine

## 2011-06-10 NOTE — Telephone Encounter (Signed)
Noted will write what I can

## 2011-06-13 ENCOUNTER — Telehealth: Payer: Self-pay | Admitting: Family Medicine

## 2011-06-14 NOTE — Telephone Encounter (Signed)
She said that she could buy them from 2-4 dollars

## 2011-06-27 ENCOUNTER — Telehealth: Payer: Self-pay | Admitting: Family Medicine

## 2011-06-28 NOTE — Telephone Encounter (Signed)
7 times daily testing?

## 2011-07-01 NOTE — Telephone Encounter (Signed)
I do not direct 7 times daily testing, maybe endo she has to get that order from endo. Max 1  Will direct is 4 times daily

## 2011-07-02 NOTE — Telephone Encounter (Signed)
Patient is no longer seeing endo because she has lost her job. Will have pharmacy fax request for approval.

## 2011-07-03 ENCOUNTER — Ambulatory Visit: Payer: Self-pay | Admitting: Family Medicine

## 2011-09-13 ENCOUNTER — Ambulatory Visit: Payer: Self-pay | Admitting: Family Medicine

## 2011-10-29 ENCOUNTER — Encounter (HOSPITAL_COMMUNITY): Payer: Self-pay | Admitting: *Deleted

## 2011-10-29 ENCOUNTER — Emergency Department (HOSPITAL_COMMUNITY)
Admission: EM | Admit: 2011-10-29 | Discharge: 2011-10-29 | Disposition: A | Discharge status: Home / Self Care | Payer: Self-pay | Attending: Emergency Medicine | Admitting: Emergency Medicine

## 2011-10-29 DIAGNOSIS — R112 Nausea with vomiting, unspecified: Secondary | ICD-10-CM | POA: Insufficient documentation

## 2011-10-29 DIAGNOSIS — Z794 Long term (current) use of insulin: Secondary | ICD-10-CM | POA: Insufficient documentation

## 2011-10-29 DIAGNOSIS — F411 Generalized anxiety disorder: Secondary | ICD-10-CM | POA: Insufficient documentation

## 2011-10-29 DIAGNOSIS — E785 Hyperlipidemia, unspecified: Secondary | ICD-10-CM | POA: Insufficient documentation

## 2011-10-29 DIAGNOSIS — R51 Headache: Secondary | ICD-10-CM | POA: Insufficient documentation

## 2011-10-29 HISTORY — DX: Headache: R51

## 2011-10-29 LAB — POCT I-STAT, CHEM 8
BUN: 22 mg/dL (ref 6–23)
Calcium, Ion: 1.25 mmol/L (ref 1.12–1.32)
Chloride: 106 mEq/L (ref 96–112)
HCT: 40 % (ref 36.0–46.0)
Potassium: 5 mEq/L (ref 3.5–5.1)
Sodium: 137 mEq/L (ref 135–145)

## 2011-10-29 MED ORDER — METOCLOPRAMIDE HCL 5 MG/ML IJ SOLN
10.0000 mg | Freq: Once | INTRAMUSCULAR | Status: AC
  Administered 2011-10-29: 10 mg via INTRAVENOUS
  Filled 2011-10-29 (×2): qty 2

## 2011-10-29 MED ORDER — KETOROLAC TROMETHAMINE 30 MG/ML IJ SOLN: 30.0000 mg | Freq: Once | INTRAMUSCULAR | Status: DC

## 2011-10-29 MED ORDER — SODIUM CHLORIDE 0.9 % IV SOLN
INTRAVENOUS | Status: DC
  Administered 2011-10-29: 14:00:00 via INTRAVENOUS

## 2011-10-29 MED ORDER — DIPHENHYDRAMINE HCL 50 MG/ML IJ SOLN
50.0000 mg | Freq: Once | INTRAMUSCULAR | Status: AC
  Administered 2011-10-29: 50 mg via INTRAVENOUS
  Filled 2011-10-29 (×2): qty 1

## 2011-10-29 NOTE — ED Notes (Signed)
Pt taking fluids without difficulty, states she feels better.

## 2011-10-29 NOTE — ED Provider Notes (Signed)
History     CSN: [id number] Arrival date & time: 10/29/2011 12:09 PM   Chief Complaint  Patient presents with  . Emesis    HPI Pt was seen at 1330.  Per pt, c/o gradual onset and persistence of constant acute flair of her chronic migraine headache since this morning.  Describes the headache as per her usual chronic migraine headache pain pattern for the past several years.  Has been eval by her PMD previously for same.  States she usually has her migraine "when I get stressed."  Has been assoc with several episodes of N/V.  Headache is located in her forehead and bitemporal areas.  Denies headache was sudden or maximal in onset or at any time.  Denies visual changes, no focal motor weakness, no tingling/numbness in extremities, no fevers, no neck pain, no rash.    PMD:  Dr. Lodema Hong Past Medical History  Diagnosis Date  . Nicotine addiction   . Anxiety   . IDDM (insulin dependent diabetes mellitus)   . Dyslipidemia   . Headache     Past Surgical History  Procedure Date  . Laser left eye Oct 01, 2008    4 treatment on left eye and 2 on the right     Family History  Problem Relation Age of Onset  . Hypertension Mother   . Diabetes Mother   . Hyperlipidemia Mother   . Rosacea Mother   . Hypertension Father     History  Substance Use Topics  . Smoking status: Former Games developer  . Smokeless tobacco: Not on file  . Alcohol Use: No    Review of Systems ROS: Statement: All systems negative except as marked or noted in the HPI; Constitutional: Negative for fever and chills. ; ; Eyes: Negative for eye pain, redness and discharge. ; ; ENMT: Negative for ear pain, hoarseness, nasal congestion, sinus pressure and sore throat. ; ; Cardiovascular: Negative for chest pain, palpitations, diaphoresis, dyspnea and peripheral edema. ; ; Respiratory: Negative for cough, wheezing and stridor. ; ; Gastrointestinal: +N/V.  Negative for diarrhea, abdominal pain, blood in stool, hematemesis, jaundice  and rectal bleeding. . ; ; Genitourinary: Negative for dysuria, flank pain and hematuria. ; ; Musculoskeletal: Negative for back pain and neck pain. Negative for swelling and trauma.; ; Skin: Negative for pruritus, rash, abrasions, blisters, bruising and skin lesion.; ; Neuro: +headache.  Negative for lightheadedness and neck stiffness. Negative for weakness, altered level of consciousness , altered mental status, extremity weakness, paresthesias, involuntary movement, seizure and syncope.     Allergies  Review of patient's allergies indicates no known allergies.  Home Medications   Current Outpatient Rx  Name Route Sig Dispense Refill  . DULOXETINE HCL 60 MG PO CPEP Oral Take 60 mg by mouth 2 (two) times daily. One tablet by mouth two times a day     . INSULIN ASPART 100 UNIT/ML [place] SOLN Subcutaneous Inject 10 Units into the skin 3 (three) times daily before meals. For sliding scale coverage for breakfast and lunch only    . INSULIN GLARGINE 100 UNIT/ML [place] SOLN Subcutaneous Inject 10-25 Units into the skin 2 (two) times daily. 25 units in the morning and 10 units at night.       BP 144/77  Pulse 94  Temp(Src) 98 F (36.7 C) (Oral)  Resp 18  Ht 5\' 7"  (1.702 m)  Wt 120 lb (54.432 kg)  BMI 18.79 kg/m2  SpO2 100%  LMP 09/29/2011  Physical Exam 1335: Physical examination:  Nursing notes reviewed; Vital signs and O2 SAT reviewed;  Constitutional: Well developed, Well nourished, Well hydrated, In no acute distress; Head:  Normocephalic, atraumatic; Eyes: EOMI, PERRL, No scleral icterus; ENMT: Mouth and pharynx normal, Mucous membranes moist; Neck: Supple, Full range of motion, No lymphadenopathy, no meningeal signs; Cardiovascular: Regular rate and rhythm, No murmur, rub, or gallop; Respiratory: Breath sounds clear & equal bilaterally, No rales, rhonchi, wheezes, or rub, Normal respiratory effort/excursion; Chest: Nontender, Movement normal; Abdomen: Soft, Nontender, Nondistended, Normal bowel  sounds; Genitourinary: No CVA tenderness; Extremities: Pulses normal, No tenderness, No edema, No calf edema or asymmetry.; Neuro: AA&Ox3, Major CN grossly intact. No facial droop, speech clear.  Normal coordination. No gross focal motor or sensory deficits in extremities.; Skin: Color normal, Warm, Dry, no rash.    ED Course  Procedures    MDM  MDM Reviewed: nursing note, vitals and previous chart Interpretation: labs   Results for orders placed during the hospital encounter of 10/29/11  POCT I-STAT, CHEM 8      Component Value Range   Sodium 137  135 - 145 (mEq/L)   Potassium 5.0  3.5 - 5.1 (mEq/L)   Chloride 106  96 - 112 (mEq/L)   BUN 22  6 - 23 (mg/dL)   Creatinine, Ser 1.61  0.50 - 1.10 (mg/dL)   Glucose, Bld 096 (*) 70 - 99 (mg/dL)   Calcium, Ion 0.45  4.09 - 1.32 (mmol/L)   TCO2 27  0 - 100 (mmol/L)   Hemoglobin 13.6  12.0 - 15.0 (g/dL)   HCT 81.1  91.4 - 78.2 (%)    3:22 PM:  States she "feels better now" and wants to go home.  Has tol PO well while in ED without N/V.  Neuro exam unchanged.  Known DM, not acidotic today.  Dx testing d/w pt.  Questions answered.  Verb understanding, agreeable to d/c home with outpt f/u.     Yoshito Gaza Allison Quarry, DO 10/30/11 1157

## 2011-10-29 NOTE — ED Notes (Signed)
Vomiting since this am and headache. Also c/o feeling hot and sweaty. Denies diarrhea.

## 2012-03-05 ENCOUNTER — Telehealth: Payer: Self-pay | Admitting: Family Medicine

## 2012-03-05 NOTE — Telephone Encounter (Signed)
Ok to send D/C to the pharmacy for this?

## 2012-03-06 NOTE — Telephone Encounter (Signed)
Called patient and left message for them to return call at the office   

## 2012-03-06 NOTE — Telephone Encounter (Signed)
pls verify that she gets all care from the health dept now. If so I am going to d/c all medication, explain this to her, and send in the d/c order for all of her meds to the pharmacy. Also explain she needs to get the provider who is seeing her to get the free meds which I had been signing for, until she returns here, this is the safest way to handle prescription medication.  Pls send me back a message to state done when this has been done completely or if there are problems

## 2012-03-24 ENCOUNTER — Ambulatory Visit: Payer: Self-pay | Admitting: Family Medicine

## 2012-04-20 ENCOUNTER — Ambulatory Visit: Payer: Self-pay | Admitting: Family Medicine

## 2012-06-01 ENCOUNTER — Telehealth: Payer: Self-pay | Admitting: Family Medicine

## 2012-06-01 DIAGNOSIS — I10 Essential (primary) hypertension: Secondary | ICD-10-CM

## 2012-06-01 DIAGNOSIS — E1129 Type 2 diabetes mellitus with other diabetic kidney complication: Secondary | ICD-10-CM

## 2012-06-01 NOTE — Telephone Encounter (Signed)
pls let Ashley Armstrong understand she need lab work HBA1C and chem 7 at least before I can safely sign for her medication, tha t will do for now, so order lab non fasting, she will be billed by lab, but nEEDS lab work

## 2012-06-02 ENCOUNTER — Telehealth: Payer: Self-pay | Admitting: Family Medicine

## 2012-06-04 NOTE — Telephone Encounter (Signed)
Labs ordered and voicemail left for pt to return call to the office.

## 2012-06-04 NOTE — Telephone Encounter (Signed)
Called patient and left message for them to return call at the office   

## 2012-06-08 LAB — BASIC METABOLIC PANEL
CO2: 28 mEq/L (ref 19–32)
Calcium: 9.5 mg/dL (ref 8.4–10.5)
Chloride: 104 mEq/L (ref 96–112)
Potassium: 4.6 mEq/L (ref 3.5–5.3)
Sodium: 138 mEq/L (ref 135–145)

## 2012-06-08 NOTE — Telephone Encounter (Signed)
pls see tele message of 7/22

## 2012-06-08 NOTE — Telephone Encounter (Signed)
Noted  

## 2012-06-08 NOTE — Telephone Encounter (Signed)
Pt is aware that she needs to complete bloodwork before the papers will be completed. Pt in today to get lab order.

## 2012-06-09 ENCOUNTER — Other Ambulatory Visit: Payer: Self-pay | Admitting: Family Medicine

## 2012-06-09 MED ORDER — INSULIN GLARGINE 100 UNIT/ML ~~LOC~~ SOLN: 50.0000 [IU] | Freq: Every day | SUBCUTANEOUS | Status: DC

## 2012-06-09 MED ORDER — INSULIN ASPART 100 UNIT/ML ~~LOC~~ SOLN: 10.0000 [IU] | Freq: Three times a day (TID) | SUBCUTANEOUS | Status: DC

## 2012-06-12 ENCOUNTER — Other Ambulatory Visit: Payer: Self-pay

## 2012-06-12 MED ORDER — INSULIN ASPART 100 UNIT/ML ~~LOC~~ SOLN: 10.0000 [IU] | Freq: Three times a day (TID) | SUBCUTANEOUS | Status: DC

## 2012-06-12 MED ORDER — INSULIN GLARGINE 100 UNIT/ML ~~LOC~~ SOLN: 50.0000 [IU] | Freq: Every day | SUBCUTANEOUS | Status: DC

## 2012-06-17 ENCOUNTER — Other Ambulatory Visit: Payer: Self-pay | Admitting: Family Medicine

## 2012-06-17 ENCOUNTER — Telehealth: Payer: Self-pay

## 2012-06-17 NOTE — Telephone Encounter (Signed)
Called Sonja back and left message to call back

## 2012-06-17 NOTE — Telephone Encounter (Signed)
Sonja aware of the new change that agrees with the way [place] has been taking it

## 2012-06-17 NOTE — Telephone Encounter (Signed)
Change to 50 units twice daily, let her knowpls

## 2012-06-17 NOTE — Telephone Encounter (Signed)
Ashley Armstrong called and stated that she received an rx for lantus from you for 50 units at bedtime but Ashley Armstrong states that she had been taking 45 units bid. Since her a1c had went up she needed to go 50 units bid. But on her medlist and what you just sent in said 50 units AT BEDTIME. Please clarify so I can let pt and Sonja know

## 2013-03-19 ENCOUNTER — Encounter: Payer: Self-pay | Admitting: Family Medicine

## 2013-03-19 ENCOUNTER — Ambulatory Visit (INDEPENDENT_AMBULATORY_CARE_PROVIDER_SITE_OTHER): Payer: BC Managed Care – PPO | Admitting: Family Medicine

## 2013-03-19 ENCOUNTER — Other Ambulatory Visit: Payer: Self-pay | Admitting: Family Medicine

## 2013-03-19 VITALS — BP 98/70 | HR 88 | Resp 16 | Ht 68.0 in | Wt 119.0 lb

## 2013-03-19 DIAGNOSIS — IMO0001 Reserved for inherently not codable concepts without codable children: Secondary | ICD-10-CM

## 2013-03-19 DIAGNOSIS — M79602 Pain in left arm: Secondary | ICD-10-CM | POA: Insufficient documentation

## 2013-03-19 DIAGNOSIS — Z139 Encounter for screening, unspecified: Secondary | ICD-10-CM

## 2013-03-19 DIAGNOSIS — E1065 Type 1 diabetes mellitus with hyperglycemia: Secondary | ICD-10-CM

## 2013-03-19 DIAGNOSIS — F411 Generalized anxiety disorder: Secondary | ICD-10-CM

## 2013-03-19 DIAGNOSIS — E559 Vitamin D deficiency, unspecified: Secondary | ICD-10-CM

## 2013-03-19 DIAGNOSIS — B351 Tinea unguium: Secondary | ICD-10-CM

## 2013-03-19 DIAGNOSIS — E785 Hyperlipidemia, unspecified: Secondary | ICD-10-CM

## 2013-03-19 DIAGNOSIS — M79609 Pain in unspecified limb: Secondary | ICD-10-CM

## 2013-03-19 DIAGNOSIS — Z23 Encounter for immunization: Secondary | ICD-10-CM

## 2013-03-19 DIAGNOSIS — R5383 Other fatigue: Secondary | ICD-10-CM

## 2013-03-19 DIAGNOSIS — R5381 Other malaise: Secondary | ICD-10-CM

## 2013-03-19 DIAGNOSIS — IMO0002 Reserved for concepts with insufficient information to code with codable children: Secondary | ICD-10-CM

## 2013-03-19 LAB — TSH: TSH: 1.853 u[IU]/mL (ref 0.350–4.500)

## 2013-03-19 LAB — COMPREHENSIVE METABOLIC PANEL
BUN: 23 mg/dL (ref 6–23)
CO2: 26 mEq/L (ref 19–32)
Calcium: 9.6 mg/dL (ref 8.4–10.5)
Chloride: 106 mEq/L (ref 96–112)
Creat: 1.33 mg/dL — ABNORMAL HIGH (ref 0.50–1.10)

## 2013-03-19 LAB — CBC WITH DIFFERENTIAL/PLATELET
Eosinophils Relative: 1 % (ref 0–5)
Hemoglobin: 13.9 g/dL (ref 12.0–15.0)
Lymphocytes Relative: 31 % (ref 12–46)
Lymphs Abs: 2.9 10*3/uL (ref 0.7–4.0)
MCV: 85.4 fL (ref 78.0–100.0)
Monocytes Relative: 7 % (ref 3–12)
Platelets: 290 10*3/uL (ref 150–400)
RBC: 4.79 MIL/uL (ref 3.87–5.11)
WBC: 9.3 10*3/uL (ref 4.0–10.5)

## 2013-03-19 LAB — LIPID PANEL
Cholesterol: 258 mg/dL — ABNORMAL HIGH (ref 0–200)
HDL: 69 mg/dL (ref >39)
Triglycerides: 100 mg/dL (ref <150)
VLDL: 20 mg/dL (ref 0–40)

## 2013-03-19 LAB — HEMOGLOBIN A1C: Hgb A1c MFr Bld: 9.9 % — ABNORMAL HIGH (ref <5.7)

## 2013-03-19 LAB — MICROALBUMIN / CREATININE URINE RATIO
Creatinine, Urine: 266.2 mg/dL
Microalb, Ur: 197.66 mg/dL — ABNORMAL HIGH (ref 0.00–1.89)

## 2013-03-19 NOTE — Patient Instructions (Signed)
F/u in 4 month, call if you need me before  You are referred to endocrinologist  Microalb today also Pneumonia vaccine  Labs today, cbc, HBA1C, lipid, cmp, TSH , vit D  Medication will be sent in for fungal toenail and skin infection after labs are reviewed  Please sign for record from eye specialist, last 3 visits  Start aspirin 81 mg one daily

## 2013-03-19 NOTE — Progress Notes (Signed)
  Subjective:    Patient ID: Ashley Armstrong, female    DOB: [birth year], 47 y.o.   MRN: [id number]  HPI Pt in today stating she is now looking for disability, has significant vision impairment, has had multiple treatments to both eyes, getting injections and has had laser surgery, marked  Improvement in vision reportedly. Has also had nerve and kidney damage due to uncontrolled diabetes. Initially diagnosed with diabetes at age 20, haws been uncontrolled  For at least 20 years C/o left elbow swelling and tenderness with reduced ROM, pain radiates to back of neck, states she feels a "pop" in the shoulder blade, symptoms have been going on for just over 1 year Both ankles have cramps at night, states she has to walk to relieve her symptoms Quit smoking last year Blood sugars continue to fluctuate dramatically and remain uncontrolled. Eating is erratic, she recognizes that this is a problem   Review of Systems See HPI Denies recent fever or chills. Denies sinus pressure, nasal congestion, ear pain or sore throat. Denies chest congestion, productive cough or wheezing. Denies chest pains, palpitations and leg swelling Denies abdominal pain, nausea, vomiting,diarrhea or constipation.   Denies dysuria, frequency, hesitancy or incontinence.  Denies headaches or  seizures,  Denies uncontrolled depression, anxiety or insomnia. Denies skin break down or rash.        Objective:   Physical Exam  Patient alert and oriented and in no cardiopulmonary distress.  HEENT: No facial asymmetry, EOMI, no sinus tenderness,  oropharynx pink and moist.  Neck adequate ROM, no adenopathy.  Chest: Clear to auscultation bilaterally.  CVS: S1, S2 no murmurs, no S3.  ABD: Soft non tender. Bowel sounds normal.  Ext: No edema  MS: Adequate ROM spine, shoulders, hips and knees.Decreeased ROM left elbow  Skin: Intact, no ulcerations or rash noted.  Psych: Good eye contact, normal affect. Memory intact not  anxious or depressed appearing.  CNS: CN 2-12 intact, power,  normal throughout.       Assessment & Plan:

## 2013-03-20 DIAGNOSIS — E559 Vitamin D deficiency, unspecified: Secondary | ICD-10-CM | POA: Insufficient documentation

## 2013-03-20 DIAGNOSIS — B351 Tinea unguium: Secondary | ICD-10-CM | POA: Insufficient documentation

## 2013-03-20 NOTE — Assessment & Plan Note (Signed)
Uncontrolled Hyperlipidemia:Low fat diet discussed and encouraged.  Pt to start med

## 2013-03-20 NOTE — Assessment & Plan Note (Signed)
Controlled on current medication ?

## 2013-03-20 NOTE — Assessment & Plan Note (Addendum)
Uncontrolled with multiple complications refer to endo Patient advised to reduce carb and sweets, commit to regular physical activity, take meds as prescribed, test blood as directed, and attempt to lose weight, to improve blood sugar control.

## 2013-03-20 NOTE — Assessment & Plan Note (Signed)
Low vit D, pt to start meds

## 2013-03-20 NOTE — Assessment & Plan Note (Signed)
C/o progressive left elbow pain with reduced mobility, radiaiting to neck, will refer to ortho for further eval

## 2013-03-23 ENCOUNTER — Other Ambulatory Visit: Payer: Self-pay | Admitting: Family Medicine

## 2013-03-23 ENCOUNTER — Telehealth: Payer: Self-pay | Admitting: Family Medicine

## 2013-03-23 MED ORDER — TERBINAFINE HCL 250 MG PO TABS: 250.0000 mg | ORAL_TABLET | Freq: Every day | ORAL | Status: DC

## 2013-03-23 MED ORDER — PRAVASTATIN SODIUM 40 MG PO TABS: 40.0000 mg | ORAL_TABLET | Freq: Every day | ORAL | Status: DC

## 2013-03-23 NOTE — Telephone Encounter (Signed)
Patient aware.

## 2013-03-24 ENCOUNTER — Other Ambulatory Visit: Payer: Self-pay

## 2013-03-24 MED ORDER — PRAVASTATIN SODIUM 40 MG PO TABS: 40.0000 mg | ORAL_TABLET | Freq: Every day | ORAL | Status: DC

## 2013-03-24 MED ORDER — TERBINAFINE HCL 250 MG PO TABS: 250.0000 mg | ORAL_TABLET | Freq: Every day | ORAL | Status: AC

## 2013-03-29 ENCOUNTER — Telehealth: Payer: Self-pay | Admitting: Family Medicine

## 2013-03-29 NOTE — Telephone Encounter (Signed)
Copy put up front for pick up

## 2013-04-01 ENCOUNTER — Other Ambulatory Visit: Payer: Self-pay

## 2013-04-01 ENCOUNTER — Telehealth: Payer: Self-pay | Admitting: Family Medicine

## 2013-04-01 DIAGNOSIS — E559 Vitamin D deficiency, unspecified: Secondary | ICD-10-CM

## 2013-04-01 MED ORDER — ERGOCALCIFEROL 1.25 MG (50000 UT) PO CAPS: 50000.0000 [IU] | ORAL_CAPSULE | ORAL | Status: DC

## 2013-04-01 NOTE — Telephone Encounter (Signed)
Spoke with patient and she is aware  

## 2013-04-01 NOTE — Telephone Encounter (Signed)
No foot cream. Called back to let pt know. No answer

## 2013-04-12 ENCOUNTER — Ambulatory Visit (HOSPITAL_COMMUNITY): Payer: Self-pay

## 2013-04-15 ENCOUNTER — Encounter: Payer: Self-pay | Admitting: Orthopedic Surgery

## 2013-04-15 ENCOUNTER — Ambulatory Visit (INDEPENDENT_AMBULATORY_CARE_PROVIDER_SITE_OTHER): Payer: BC Managed Care – PPO | Admitting: Orthopedic Surgery

## 2013-04-15 ENCOUNTER — Ambulatory Visit (INDEPENDENT_AMBULATORY_CARE_PROVIDER_SITE_OTHER): Payer: BC Managed Care – PPO

## 2013-04-15 VITALS — BP 128/84 | Ht 67.0 in | Wt 122.0 lb

## 2013-04-15 DIAGNOSIS — M7710 Lateral epicondylitis, unspecified elbow: Secondary | ICD-10-CM | POA: Insufficient documentation

## 2013-04-15 DIAGNOSIS — M67919 Unspecified disorder of synovium and tendon, unspecified shoulder: Secondary | ICD-10-CM

## 2013-04-15 DIAGNOSIS — M75102 Unspecified rotator cuff tear or rupture of left shoulder, not specified as traumatic: Secondary | ICD-10-CM

## 2013-04-15 DIAGNOSIS — M7712 Lateral epicondylitis, left elbow: Secondary | ICD-10-CM

## 2013-04-15 MED ORDER — TRAMADOL-ACETAMINOPHEN 37.5-325 MG PO TABS: 1.0000 | ORAL_TABLET | ORAL | Status: DC | PRN

## 2013-04-15 NOTE — Patient Instructions (Addendum)
Start therapy at hospital: Rotator cuff syndrome  Tennis elbow   You have received a steroid shot. 15% of patients experience increased pain at the injection site with in the next 24 hours. This is best treated with ice and tylenol extra strength 2 tabs every 8 hours. If you are still having pain please call the office.   Tennis Elbow Your caregiver has diagnosed you with a condition often referred to as "tennis elbow." This results from small tears or soreness (inflammation) at the start (origin) of the extensor muscles of the forearm. Although the condition is often called tennis or golfer's elbow, it is caused by any repetitive action performed by your elbow. HOME CARE INSTRUCTIONS  If the condition has been short lived, rest may be the only treatment required. Using your opposite hand or arm to perform the task may help. Even changing your grip may help rest the extremity. These may even prevent the condition from recurring.  Longer standing problems, however, will often be relieved faster by:  Using anti-inflammatory agents.  Applying ice packs for 30 minutes at the end of the working day, at bed time, or when activities are finished.  Your caregiver may also have you wear a splint or sling. This will allow the inflamed tendon to heal. At times, steroid injections aided with a local anesthetic will be required along with splinting for 1 to 2 weeks. Two to three steroid injections will often solve the problem. In some long standing cases, the inflamed tendon does not respond to conservative (non-surgical) therapy. Then surgery may be required to repair it. MAKE SURE YOU:   Understand these instructions.  Will watch your condition.  Will get help right away if you are not doing well or get worse. Document Released: 10/28/2005 Document Revised: 01/20/2012 Document Reviewed: 06/15/2008 [facility] Patient Information 2014 [place], [place]. Secondary Impingement Syndrome with Rehab  The  rotator cuff consists of four muscles that are responsible for much of the function of the shoulder joint. The subacromial bursa is a fluid filled sac that reduces friction between part of the shoulder (acromion) and the rotator cuff tendons. Secondary impingement syndrome is a condition in which the subacromial bursa or tendons of the rotator cuff become inflamed. Inflammation of either of these tendons causes pain and weakness in the shoulder.  SYMPTOMS   Pain, weakness, and/or loss of motion in the shoulder.  Pain that worsens with shoulder function.  Tenderness, swelling, warmth, or redness may occasionally be present over the outer aspect of the shoulder.  A crackling sound (crepitation) when the shoulder is moved. CAUSES  Secondary impingement syndrome is caused by inflammation of the rotator cuff tendons or subacromial bursa. Common mechanisms of injury include:  Repetitive and/or strenuous shoulder movements, especially those above shoulder height.  Direct trauma to the shoulder (uncommon). RISK INCREASES WITH:  Contact sports (football, wrestling, or boxing).  Activities involving arm movement above shoulder height (baseball, volleyball, or racquet sports).  Water engineer.  Heavy labor.  Previous shoulder injury.  Poor strength and flexibility.  Loose shoulder ligaments. PREVENTION  Warm up and stretch properly before activity.  Allow for adequate recovery between workouts.  Maintain physical fitness:  Strength, flexibility, and endurance.  Cardiovascular fitness.  Learn and use proper technique. When possible, have coach correct improper technique. PROGNOSIS  If treated properly, then secondary shoulder impingement normally resolves with conservative treatment. RELATED COMPLICATIONS   Prolonged healing time, if improperly treated or re-injured.  Recurrent symptoms that result in  a chronic problem.  Other shoulder conditions, such as frozen  shoulder or a rotator cuff tear. TREATMENT Treatment initially involves the use of ice and medication to help reduce pain and inflammation. The use of strengthening and stretching exercises may help reduce pain with activity, especially those exercises focused on stabilizing the shoulder blade (scapula). These exercises may be performed at home or with referral to a therapist. If symptoms persist for greater than 6 months despite non-surgical (conservative) treatment, then surgery may be recommended.  MEDICATION   If pain medication is necessary, then nonsteroidal anti-inflammatory medications, such as aspirin and ibuprofen, or other minor pain relievers, such as acetaminophen, are often recommended.  Do not take pain medication for 7 days before surgery.  Prescription pain relievers may be given if deemed necessary by your caregiver. Use only as directed and only as much as you need. COLD THERAPY  Cold treatment (icing) relieves pain and reduces inflammation. Cold treatment should be applied for 10 to 15 minutes every 2 to 3 hours for inflammation and pain and immediately after any activity that aggravates your symptoms. Use ice packs or massage the area with a piece of ice (ice massage). SEEK MEDICAL CARE IF:  Treatment seems to offer no benefit, or the condition worsens.  Any medications produce adverse side effects.

## 2013-04-17 ENCOUNTER — Encounter: Payer: Self-pay | Admitting: Orthopedic Surgery

## 2013-04-17 NOTE — Progress Notes (Signed)
Patient ID: Ashley Armstrong, female   DOB: 1966/10/24, 47 y.o.   MRN: [id number] Chief Complaint  Patient presents with  . Shoulder Pain    Pain in shoulder all the way down to wrist, popping in shoulder, hard to raise arm up    47 year old female complains of pain in her left shoulder radiating down to her left elbow with some popping and difficulty with forward elevation of the arm. Her pain has been present for several months with no trauma. She describes a dull aching sensation in left shoulder and also over the left elbow on the lateral side  Review of systems as recorded in the medical record  Physical exam BP 128/84  Ht 5\' 7"  (1.702 m)  Wt 122 lb (55.339 kg)  BMI 19.1 kg/m2   Vital signs are stable as recorded  General appearance is normal  The patient is alert and oriented x3  The patient's mood and affect are normal  Gait assessment: No gait abnormalities  The cardiovascular exam reveals normal pulses and temperature without edema or  swelling.  The lymphatic system is negative for palpable lymph nodes  The sensory exam is normal.  There are no pathologic reflexes.  Balance is normal.  Lower extremity exam  Ambulation is normal.  The right and left lower extremity:  Inspection and palpation revealed no tenderness or abnormality in alignment in the lower extremities. Range of motion is full.  Strength is grade 5.  All joints are stable.    Exam of the right shoulder full range of motion no contracture subluxation atrophy tremor or tenderness Left shoulder Inspection tenderness in the posterior subacromial space lateral deltoid. She also has tenderness over the left lateral upper condyle Range of motion is normal painful range of motion of the left elbow is painful dorsiflexion against resistance. She is full passive range of motion of the shoulder with pain from 120 of flexion 280 of flexion. Stability apprehension normal Strength supraspinatus strength  5 over 5, internal and external rotation strength 5 over 5. Negative drop test. Positive impingement sign. Skin normal left upper extremity.  X-rays show no acute abnormalities  Impression left rotator cuff syndrome and left lateral epicondylitis  Left shoulder injection Left tennis elbow brace Stretching strengthening exercises Followup if not improved  Subacromial Shoulder Injection Procedure Note  Pre-operative Diagnosis: left RC Syndrome  Post-operative Diagnosis: same  Indications: pain   Anesthesia: ethyl chloride   Procedure Details   Verbal consent was obtained for the procedure. The shoulder was prepped withalcohol and the skin was anesthetized. A 20 gauge needle was advanced into the subacromial space through posterior approach without difficulty  The space was then injected with 3 ml 1% lidocaine and 1 ml of depomedrol. The injection site was cleansed with isopropyl alcohol and a dressing was applied.  Complications:  None; patient tolerated the procedure well.

## 2013-04-26 ENCOUNTER — Ambulatory Visit (HOSPITAL_COMMUNITY)
Admission: RE | Admit: 2013-04-26 | Discharge: 2013-04-26 | Disposition: A | Discharge status: Home / Self Care | Payer: BC Managed Care – PPO | Source: Ambulatory Visit | Attending: Orthopedic Surgery | Admitting: Orthopedic Surgery

## 2013-04-26 DIAGNOSIS — M25519 Pain in unspecified shoulder: Secondary | ICD-10-CM | POA: Insufficient documentation

## 2013-04-26 DIAGNOSIS — IMO0001 Reserved for inherently not codable concepts without codable children: Secondary | ICD-10-CM | POA: Insufficient documentation

## 2013-04-26 DIAGNOSIS — M6281 Muscle weakness (generalized): Secondary | ICD-10-CM | POA: Insufficient documentation

## 2013-04-26 DIAGNOSIS — E119 Type 2 diabetes mellitus without complications: Secondary | ICD-10-CM | POA: Insufficient documentation

## 2013-04-26 DIAGNOSIS — Z794 Long term (current) use of insulin: Secondary | ICD-10-CM | POA: Insufficient documentation

## 2013-04-26 NOTE — Evaluation (Signed)
Occupational Therapy Evaluation  Patient Details  Name: Ashley Armstrong MRN: [id number] Date of Birth: 11/17/1965  Today's Date: 04/26/2013 Time: 0900-0950 OT Time Calculation (min): 50 min OT eval 900-935 35' MFR 935-950 15'  Visit#: 1 of 12  Re-eval: 05/24/13  Assessment Diagnosis: Lt. shoulder impingement and tennis elbow  Authorization:    Authorization Time Period:    Authorization Visit#:   of     Past Medical History:  Past Medical History  Diagnosis Date  . Nicotine addiction   . Anxiety   . IDDM (insulin dependent diabetes mellitus)   . Dyslipidemia   . NWGNFAOZ(308.6)    Past Surgical History:  Past Surgical History  Procedure Laterality Date  . Laser left eye  Oct 01, 2008    4 treatment on left eye and 2 on the right     Subjective Symptoms/Limitations Symptoms: S: I've had this problem for a year and a half and I finally got insurance so I could get it checked out.  Pertinent History: Approximately 1.5 years ago Ashley Armstrong began to experience pain in her shoulder that would radiate down to her elbow and hand. Patient did not experience this pain until she stoped working. Patient is currently experiencing pain and decreased AROM of her shoulder which is limiting her performance during daily activities. Patient has received a steriod shot in her shoulder which helped with pain control. Dr. Romeo Apple has referred patient to occupational therapy for evaluation and treatment.  Special Tests: DASH 54.5 with ideal score of 0. Patient Stated Goals: To get her arm back to normal.  Pain Assessment Currently in Pain?: Yes Pain Score:   5 Pain Location: Shoulder Pain Orientation: Left Pain Type: Acute pain Pain Radiating Towards: neck down to hand Pain Frequency: Constant Pain Relieving Factors: pain meds  Precautions/Restrictions  Precautions Precautions: None Restrictions Weight Bearing Restrictions: No  Balance Screening Balance Screen Has the patient fallen in  the past 6 months: No  Prior Functioning  Home Living Lives With: Friend(s) Prior Function Level of Independence: Independent with basic ADLs;Independent with transfers Able to Take Stairs?: Yes Driving: Yes Vocation: Full time employment Vocation Requirements: textile-yarn Leisure: Hobbies-yes (Comment) Comments: very active. enjoys basketball  Assessment ADL/Vision/Perception ADL ADL Comments: difficulty washing back, raising arm above head Dominant Hand: Right  Cognition/Observation Cognition Overall Cognitive Status: Within Functional Limits for tasks assessed Arousal/Alertness: Awake/alert Orientation Level: Oriented X4  Sensation/Coordination/Edema Edema Edema: Mild edema noted on medial portion of elbow.  Additional Assessments RUE Assessment RUE Assessment:  (Assessed seated. ER/IR ADD) RUE AROM (degrees) Right Shoulder Flexion: 155 Degrees Right Shoulder ABduction: 151 Degrees Right Shoulder Internal Rotation: 90 Degrees Right Shoulder External Rotation: 90 Degrees RUE Strength Right Shoulder Flexion: 5/5 Right Shoulder ABduction: 5/5 Right Shoulder Internal Rotation: 5/5 Right Shoulder External Rotation: 5/5 Grip (lbs): 60 Lateral Pinch: 12 lbs 3 Point Pinch: 14 lbs LUE Assessment LUE Assessment:  (assessed seated. IR/ER ADD) LUE AROM (degrees) LUE Overall AROM Comments: Elbow and wrist AROM WNL Left Shoulder Flexion: 124 Degrees Left Shoulder ABduction: 128 Degrees Left Shoulder Internal Rotation: 90 Degrees Left Shoulder External Rotation: 25 Degrees LUE Strength Left Shoulder Flexion:  (4-/5) Left Shoulder ABduction:  (4-/5) Left Shoulder Internal Rotation:  (4-/5) Left Shoulder External Rotation:  (4-/5) Grip (lbs): 52 Lateral Pinch: 12 lbs 3 Point Pinch: 11 lbs Palpation Palpation: Min fascial restrictions noted in upper arm and trapezius region. Right Hand Strength - Pinch (lbs) Lateral Pinch: 12 lbs 3 Point Pinch: 14 lbs Left  Hand  Strength - Pinch (lbs) Lateral Pinch: 12 lbs 3 Point Pinch: 11 lbs   Exercise/Treatments     Manual Therapy Manual Therapy: Myofascial release Myofascial Release: MFR and manual stretching to left elbow, upper arm, shoulder, and trapezius region to decrease fascial restrictions and increase joint mobility in a pain free zone.   Occupational Therapy Assessment and Plan OT Assessment and Plan Clinical Impression Statement: A:  Patient presents with increased pain and fascial restrictions and decreased AROM, PROM, and strength in her left shoulder, causing decreased I with her B/IADLs and leisure activities.  Skilled OT intervention is indicated to decrease pain and restrictions and increase pain free mobility needed to resume her prior level of independence with all functional activities.  Pt will benefit from skilled therapeutic intervention in order to improve on the following deficits: Increased edema;Decreased range of motion;Pain;Decreased strength Rehab Potential: Excellent OT Frequency: Min 2X/week OT Duration: 6 weeks OT Treatment/Interventions: Self-care/ADL training;Therapeutic activities;Therapeutic exercise;Manual therapy;Modalities;Patient/family education;DME and/or AE instruction OT Plan: P:  Skilled OT intervention to decrease pain and fascial restrictions and increase pain free mobility needed to use LUE with all B/IADLs and leisure activities.  Treatment Plan:  MFR and manual stretching in supine, AAROM progressing to AROM, pulleys, ball stretches, scapular stability exercises, modalities as needed.     Goals Short Term Goals Time to Complete Short Term Goals: 3 weeks Short Term Goal 1: Patient will be educated on HEP.  Short Term Goal 2: Patient will increase left shoulder PROM to WNL for increased ability to wash her back. Short Term Goal 3: Patient will decrease pain in her left shoulder and elbow to 3/10 or better when performing work related activities. Short Term Goal  4: Patient will decrease fascial restrictions to trace in her left shoulder region for greater mobility needed to fasten her bra strap. Short Term Goal 5: Patient will increase grip strength by 5# and pinch strength by 2#. Long Term Goals Time to Complete Long Term Goals: 6 weeks Long Term Goal 1: Patient will return to highest level of independence with all daily, work, and leisure activities. Long Term Goal 2: Patient will increase left shoulder AROM to WNL for increased ability to fasten her bra and wash her back. Long Term Goal 3: Patient will decrease pain in her left shoulder and elbow to 2/10 or better when participating in work related activities. Long Term Goal 4: Patient will decrease fascial restrictions to zero in her left shoulder region for greater mobility needed to fasten her bra strap. Long Term Goal 5: Patient will increase scapular stability from a good - to a good + to increase ability to lift item overhead.   Problem List Patient Active Problem List   Diagnosis Date Noted  . Pain in joint, shoulder region 04/26/2013  . Muscle weakness (generalized) 04/26/2013  . Tennis elbow syndrome 04/15/2013  . Rotator cuff syndrome of left shoulder 04/15/2013  . Unspecified vitamin D deficiency 03/20/2013  . Onychomycosis of toenail 03/20/2013  . Left arm pain 03/19/2013  . HEADACHE 11/30/2008  . DIABETES MELLITUS, WITH RENAL COMPLICATIONS 10/24/2008  . FATIGUE 10/24/2008  . GASTROPARESIS 06/16/2008  . Diabetes mellitus, insulin dependent (IDDM), uncontrolled 04/13/2008  . Other and unspecified hyperlipidemia 04/13/2008  . GENERALIZED ANXIETY DISORDER 04/13/2008    End of Session Activity Tolerance: Patient tolerated treatment well General Behavior During Therapy: WFL for tasks assessed/performed Cognition: WFL for tasks performed OT Plan of Care OT Home Exercise Plan: dowel rod and tennis elbow  exercises OT Patient Instructions: handout Consulted and Agree with Plan of  Care: Patient   Limmie Patricia, OTR/L,CBIS   04/26/2013, 10:22 AM  Physician Documentation Your signature is required to indicate approval of the treatment plan as stated above.  Please sign and either send electronically or make a copy of this report for your files and return this physician signed original.  Please mark one 1.__approve of plan  2. ___approve of plan with the following conditions.   ______________________________                                                          _____________________ Physician Signature                                                                                                             Date

## 2013-04-28 ENCOUNTER — Telehealth (HOSPITAL_COMMUNITY): Payer: Self-pay

## 2013-04-28 ENCOUNTER — Emergency Department (HOSPITAL_COMMUNITY): Payer: BC Managed Care – PPO

## 2013-04-28 ENCOUNTER — Observation Stay (HOSPITAL_COMMUNITY)
Admission: EM | Admit: 2013-04-28 | Discharge: 2013-04-28 | Disposition: A | Discharge status: Home / Self Care | Payer: BC Managed Care – PPO | Attending: Internal Medicine | Admitting: Internal Medicine

## 2013-04-28 ENCOUNTER — Inpatient Hospital Stay (HOSPITAL_COMMUNITY): Admission: RE | Admit: 2013-04-28 | Payer: BC Managed Care – PPO | Source: Ambulatory Visit

## 2013-04-28 ENCOUNTER — Observation Stay (HOSPITAL_COMMUNITY): Payer: BC Managed Care – PPO

## 2013-04-28 ENCOUNTER — Encounter (HOSPITAL_COMMUNITY): Payer: Self-pay | Admitting: Emergency Medicine

## 2013-04-28 DIAGNOSIS — I129 Hypertensive chronic kidney disease with stage 1 through stage 4 chronic kidney disease, or unspecified chronic kidney disease: Secondary | ICD-10-CM | POA: Insufficient documentation

## 2013-04-28 DIAGNOSIS — G459 Transient cerebral ischemic attack, unspecified: Secondary | ICD-10-CM

## 2013-04-28 DIAGNOSIS — N189 Chronic kidney disease, unspecified: Secondary | ICD-10-CM | POA: Diagnosis present

## 2013-04-28 DIAGNOSIS — IMO0001 Reserved for inherently not codable concepts without codable children: Secondary | ICD-10-CM | POA: Diagnosis present

## 2013-04-28 DIAGNOSIS — E785 Hyperlipidemia, unspecified: Secondary | ICD-10-CM | POA: Insufficient documentation

## 2013-04-28 DIAGNOSIS — Z79899 Other long term (current) drug therapy: Secondary | ICD-10-CM | POA: Insufficient documentation

## 2013-04-28 DIAGNOSIS — Z794 Long term (current) use of insulin: Secondary | ICD-10-CM | POA: Insufficient documentation

## 2013-04-28 DIAGNOSIS — R29810 Facial weakness: Secondary | ICD-10-CM | POA: Insufficient documentation

## 2013-04-28 DIAGNOSIS — R4789 Other speech disturbances: Secondary | ICD-10-CM | POA: Insufficient documentation

## 2013-04-28 DIAGNOSIS — K3184 Gastroparesis: Secondary | ICD-10-CM | POA: Diagnosis present

## 2013-04-28 DIAGNOSIS — N182 Chronic kidney disease, stage 2 (mild): Secondary | ICD-10-CM

## 2013-04-28 DIAGNOSIS — E559 Vitamin D deficiency, unspecified: Secondary | ICD-10-CM | POA: Diagnosis present

## 2013-04-28 DIAGNOSIS — R739 Hyperglycemia, unspecified: Secondary | ICD-10-CM

## 2013-04-28 DIAGNOSIS — E1129 Type 2 diabetes mellitus with other diabetic kidney complication: Secondary | ICD-10-CM

## 2013-04-28 DIAGNOSIS — R4781 Slurred speech: Secondary | ICD-10-CM

## 2013-04-28 DIAGNOSIS — F411 Generalized anxiety disorder: Secondary | ICD-10-CM | POA: Diagnosis present

## 2013-04-28 DIAGNOSIS — E1065 Type 1 diabetes mellitus with hyperglycemia: Secondary | ICD-10-CM

## 2013-04-28 LAB — GLUCOSE, CAPILLARY
Glucose-Capillary: 138 mg/dL — ABNORMAL HIGH (ref 70–99)
Glucose-Capillary: 278 mg/dL — ABNORMAL HIGH (ref 70–99)

## 2013-04-28 LAB — BLOOD GAS, ARTERIAL
Acid-base deficit: 1.4 mmol/L (ref 0.0–2.0)
Bicarbonate: 22.9 mEq/L (ref 20.0–24.0)
O2 Saturation: 97.3 %
TCO2: 20.6 mmol/L (ref 0–100)
pO2, Arterial: 92.8 mmHg (ref 80.0–100.0)

## 2013-04-28 LAB — LIPID PANEL
LDL Cholesterol: 126 mg/dL — ABNORMAL HIGH (ref 0–99)
Total CHOL/HDL Ratio: 2.9 RATIO

## 2013-04-28 LAB — COMPREHENSIVE METABOLIC PANEL
ALT: 24 U/L (ref 0–35)
CO2: 23 mEq/L (ref 19–32)
Calcium: 9.4 mg/dL (ref 8.4–10.5)
GFR calc Af Amer: 62 mL/min — ABNORMAL LOW (ref >90)
GFR calc non Af Amer: 54 mL/min — ABNORMAL LOW (ref >90)
Glucose, Bld: 473 mg/dL — ABNORMAL HIGH (ref 70–99)
Sodium: 130 mEq/L — ABNORMAL LOW (ref 135–145)

## 2013-04-28 LAB — URINALYSIS, ROUTINE W REFLEX MICROSCOPIC
Bilirubin Urine: NEGATIVE
Ketones, ur: NEGATIVE mg/dL
Leukocytes, UA: NEGATIVE
Nitrite: NEGATIVE
Protein, ur: 100 mg/dL — AB
pH: 5.5 (ref 5.0–8.0)

## 2013-04-28 LAB — POCT PREGNANCY, URINE: Preg Test, Ur: NEGATIVE

## 2013-04-28 LAB — URINE MICROSCOPIC-ADD ON

## 2013-04-28 LAB — POCT I-STAT, CHEM 8
Calcium, Ion: 1.14 mmol/L (ref 1.12–1.23)
Chloride: 102 mEq/L (ref 96–112)
Glucose, Bld: 456 mg/dL — ABNORMAL HIGH (ref 70–99)
HCT: 40 % (ref 36.0–46.0)
TCO2: 24 mmol/L (ref 0–100)

## 2013-04-28 LAB — CBC
Hemoglobin: 13.1 g/dL (ref 12.0–15.0)
MCH: 28.8 pg (ref 26.0–34.0)
MCV: 85.3 fL (ref 78.0–100.0)
RBC: 4.55 MIL/uL (ref 3.87–5.11)

## 2013-04-28 LAB — HEMOGLOBIN A1C: Mean Plasma Glucose: 223 mg/dL — ABNORMAL HIGH (ref <117)

## 2013-04-28 MED ORDER — PRAVASTATIN SODIUM 40 MG PO TABS: 80.0000 mg | ORAL_TABLET | Freq: Every day | ORAL | Status: DC

## 2013-04-28 MED ORDER — TERBINAFINE HCL 250 MG PO TABS
250.0000 mg | ORAL_TABLET | Freq: Every day | ORAL | Status: DC
  Administered 2013-04-28: 250 mg via ORAL
  Filled 2013-04-28: qty 1

## 2013-04-28 MED ORDER — OMEGA-3-ACID ETHYL ESTERS 1 G PO CAPS
1.0000 g | ORAL_CAPSULE | Freq: Every day | ORAL | Status: DC
  Administered 2013-04-28: 1 g via ORAL
  Filled 2013-04-28: qty 1

## 2013-04-28 MED ORDER — ACETAMINOPHEN 325 MG PO TABS
650.0000 mg | ORAL_TABLET | Freq: Once | ORAL | Status: AC
  Administered 2013-04-28: 650 mg via ORAL
  Filled 2013-04-28: qty 2

## 2013-04-28 MED ORDER — SODIUM CHLORIDE 0.9 % IV SOLN
INTRAVENOUS | Status: DC
  Administered 2013-04-28: 06:00:00 via INTRAVENOUS

## 2013-04-28 MED ORDER — INSULIN ASPART 100 UNIT/ML ~~LOC~~ SOLN
4.0000 [IU] | Freq: Three times a day (TID) | SUBCUTANEOUS | Status: DC
  Administered 2013-04-28 (×2): 4 [IU] via SUBCUTANEOUS

## 2013-04-28 MED ORDER — CLOPIDOGREL BISULFATE 75 MG PO TABS
75.0000 mg | ORAL_TABLET | Freq: Every day | ORAL | Status: DC
  Administered 2013-04-28: 75 mg via ORAL
  Filled 2013-04-28 (×2): qty 1

## 2013-04-28 MED ORDER — CLOPIDOGREL BISULFATE 75 MG PO TABS: 75.0000 mg | ORAL_TABLET | Freq: Every day | ORAL | Status: DC

## 2013-04-28 MED ORDER — ENOXAPARIN SODIUM 40 MG/0.4ML ~~LOC~~ SOLN
40.0000 mg | SUBCUTANEOUS | Status: DC
  Administered 2013-04-28: 40 mg via SUBCUTANEOUS
  Filled 2013-04-28: qty 0.4

## 2013-04-28 MED ORDER — INSULIN ASPART 100 UNIT/ML ~~LOC~~ SOLN
10.0000 [IU] | Freq: Once | SUBCUTANEOUS | Status: AC
  Administered 2013-04-28: 10 [IU] via SUBCUTANEOUS
  Filled 2013-04-28: qty 1

## 2013-04-28 MED ORDER — ACETAMINOPHEN 325 MG PO TABS: 650.0000 mg | ORAL_TABLET | Freq: Four times a day (QID) | ORAL | Status: DC | PRN

## 2013-04-28 MED ORDER — LISINOPRIL 10 MG PO TABS: 10.0000 mg | ORAL_TABLET | Freq: Every day | ORAL | Status: DC

## 2013-04-28 MED ORDER — INSULIN GLARGINE 100 UNIT/ML ~~LOC~~ SOLN: 25.0000 [IU] | Freq: Every day | SUBCUTANEOUS | Status: DC

## 2013-04-28 MED ORDER — SODIUM CHLORIDE 0.9 % IV BOLUS (SEPSIS)
1000.0000 mL | Freq: Once | INTRAVENOUS | Status: AC
  Administered 2013-04-28: 1000 mL via INTRAVENOUS

## 2013-04-28 MED ORDER — INSULIN ASPART 100 UNIT/ML ~~LOC~~ SOLN
0.0000 [IU] | Freq: Three times a day (TID) | SUBCUTANEOUS | Status: DC
  Administered 2013-04-28: 8 [IU] via SUBCUTANEOUS
  Administered 2013-04-28: 2 [IU] via SUBCUTANEOUS

## 2013-04-28 MED ORDER — LORAZEPAM 2 MG/ML IJ SOLN
2.0000 mg | Freq: Once | INTRAMUSCULAR | Status: AC
  Administered 2013-04-28: 2 mg via INTRAVENOUS
  Filled 2013-04-28: qty 1

## 2013-04-28 MED ORDER — INSULIN GLARGINE 100 UNIT/ML ~~LOC~~ SOLN: 65.0000 [IU] | Freq: Every day | SUBCUTANEOUS | Status: DC

## 2013-04-28 MED ORDER — ASPIRIN EC 81 MG PO TBEC
81.0000 mg | DELAYED_RELEASE_TABLET | Freq: Every day | ORAL | Status: DC
  Administered 2013-04-28: 81 mg via ORAL
  Filled 2013-04-28: qty 1

## 2013-04-28 MED ORDER — SIMVASTATIN 20 MG PO TABS
20.0000 mg | ORAL_TABLET | Freq: Every day | ORAL | Status: DC
  Filled 2013-04-28: qty 1

## 2013-04-28 MED ORDER — INSULIN GLARGINE 100 UNIT/ML ~~LOC~~ SOLN
20.0000 [IU] | Freq: Every day | SUBCUTANEOUS | Status: DC
  Administered 2013-04-28: 20 [IU] via SUBCUTANEOUS
  Filled 2013-04-28: qty 0.2

## 2013-04-28 NOTE — H&P (Signed)
History and Physical  Ashley Armstrong:[id number] DOB: [birth year] DOA: 04/28/2013  Referring physician: Dr Dierdre Highman PCP: Syliva Overman, MD   Chief Complaint: Slurred speech, blurred vision and dizziness  HPI:  Patient is 47 year old female with PMH most significant for poorly controlled diabetes, dyslipidemia and anxiety disorder comes in with chief complaints of slurred speech, dizziness and blurred vision. History provided by patient. Symptoms started about 2 days ago. Patient had multiple complaints but the most significant was start of a headache when she woke up Monday morning. Headache was described as frontal, 10 out of 10, persistent, no exacerbating or relieving factors, nonradiating, patient did not take anything for headache. She has a history of migraines but has not had headaches for many years.  Patient showed up to walk tonight feeling dizzy. Her blood sugar tonight before work was 250. She went to work and coworker noticed that she had slurred speech, R facial droop with increased dizziness and increased blurred vision. Her coworker and she decided to come to [place] long for evaluation of stroke. She presents with blood sugar in the 400s. She denies any fevers or chills. No chest pain or shortness of breath. No abdominal pain at this time. Patient also complains of palpitations since last 2 days. No weakness or numbness in any particular part of the body.  15 point review of system is negative except what is noted in the HPI.   Past Medical History  Diagnosis Date  . Nicotine addiction   . Anxiety   . IDDM (insulin dependent diabetes mellitus)   . Dyslipidemia   . WJXBJYNW(295.6)     Past Surgical History  Procedure Laterality Date  . Laser left eye  Oct 01, 2008    4 treatment on left eye and 2 on the right     Social History:  reports that she has quit smoking. She does not have any smokeless tobacco history on file. She reports that she does not drink alcohol or use  illicit drugs.  No Known Allergies  Family History  Problem Relation Age of Onset  . Hypertension Mother   . Diabetes Mother   . Hyperlipidemia Mother   . Rosacea Mother   . Hypertension Father      Prior to Admission medications   Medication Sig Start Date End Date Taking? Authorizing Provider  ergocalciferol (VITAMIN D2) 50000 UNITS capsule Take 1 capsule (50,000 Units total) by mouth once a week. One capsule once weekly 04/01/13  Yes Kerri Perches, MD  insulin aspart (NOVOLOG) 100 UNIT/ML injection Inject 10 Units into the skin 3 (three) times daily before meals. 06/12/12 06/12/13 Yes Kerri Perches, MD  insulin glargine (LANTUS) 100 UNIT/ML injection Inject 65 Units into the skin daily.  03/19/13  Yes Kerri Perches, MD  omega-3 acid ethyl esters (LOVAZA) 1 G capsule Take 1 g by mouth daily.   Yes Historical Provider, MD  pravastatin (PRAVACHOL) 40 MG tablet Take 1 tablet (40 mg total) by mouth daily. 03/24/13 03/24/14 Yes Kerri Perches, MD  Ranibizumab (LUCENTIS) 0.3 MG/0.05ML SOLN Inject 0.3 mg into the eye every 6 (six) weeks.   Yes Historical Provider, MD  terbinafine (LAMISIL) 250 MG tablet Take 1 tablet (250 mg total) by mouth daily. 03/24/13 06/24/13 Yes Kerri Perches, MD  traMADol-acetaminophen (ULTRACET) 37.5-325 MG per tablet Take 1 tablet by mouth every 4 (four) hours as needed for pain. 04/15/13  Yes Vickki Hearing, MD   Physical Exam: Filed Vitals:  04/28/13 0107  BP: 145/90  Pulse: 97  Temp: 97.6 F (36.4 C)  TempSrc: Oral  Resp: 18  SpO2: 100%   Physical Exam: General: Vital signs reviewed and noted. Well-developed, well-nourished, in no acute distress; alert, appropriate and cooperative throughout examination.  Head: Normocephalic, atraumatic.  Eyes: PERRL, EOMI, No signs of anemia or jaundince.  Nose: Mucous membranes moist, not inflammed, nonerythematous.  Throat: Oropharynx nonerythematous, no exudate appreciated.   Neck: No deformities,  masses, or tenderness noted.Supple, No carotid Bruits, no JVD.  Lungs:  Normal respiratory effort. Clear to auscultation BL without crackles or wheezes.  Heart: RRR. S1 and S2 normal without gallop, murmur, or rubs.  Abdomen:  BS normoactive. Soft, Nondistended, non-tender.  No masses or organomegaly.  Extremities: No pretibial edema.  Neurologic: A&O X3, CN II - XII are grossly intact. Motor strength is 5/5 in the all 4 extremities, Sensations intact to light touch, patient swayed to the right side of standing with eyes closed (Romberg sign positive )  Skin: No visible rashes, scars.     Wt Readings from Last 3 Encounters:  04/15/13 122 lb (55.339 kg)  03/19/13 119 lb (53.978 kg)  10/29/11 120 lb (54.432 kg)    Labs on Admission:  Basic Metabolic Panel:  Recent Labs Lab 04/28/13 0220 04/28/13 0226  NA 130* 133*  K 4.4 4.5  CL 95* 102  CO2 23  --   GLUCOSE 473* 456*  BUN 32* 36*  CREATININE 1.20* 1.30*  CALCIUM 9.4  --     Liver Function Tests:  Recent Labs Lab 04/28/13 0220  AST 21  ALT 24  ALKPHOS 98  BILITOT 0.3  PROT 6.7  ALBUMIN 3.3*   CBC:  Recent Labs Lab 04/28/13 0220 04/28/13 0226  WBC 9.6  --   HGB 13.1 13.6  HCT 38.8 40.0  MCV 85.3  --   PLT 216  --     CBG:  Recent Labs Lab 04/28/13 0110  GLUCAP 423*     Radiological Exams on Admission: Ct Head Wo Contrast  04/28/2013   *RADIOLOGY REPORT*  Clinical Data: Dizziness.  Headache.  CT HEAD WITHOUT CONTRAST  Technique:  Contiguous axial images were obtained from the base of the skull through the vertex without contrast.  Comparison: Head CT 01/26/2006.  Findings: No acute intracranial abnormalities.  Specifically, no evidence of acute intracranial hemorrhage, no definite findings of acute/subacute cerebral ischemia, no mass, mass effect, hydrocephalus or abnormal intra or extra-axial fluid collections. Visualized paranasal sinuses and mastoids are well pneumatized.  No acute displaced skull  fractures are identified.  IMPRESSION: 1.  No acute intracranial abnormalities. 2.  The appearance of the brain is normal.   Original Report Authenticated By: Trudie Reed, M.D.    EKG: not done   Principal Problem:   TIA (transient ischemic attack) Active Problems:   Diabetes mellitus, insulin dependent (IDDM), uncontrolled   Generalized anxiety disorder   Gastroparesis   Unspecified vitamin D deficiency   Assessment/Plan  Stroke Risk Factors - diabetes mellitus, hyperlipidemia, hypertension and history of smoking  Plan: 1. HgbA1c, fasting lipid panel 2. MRI, MRA  of the brain without contrast 3. PT consult, OT consult, Speech consult 4. Echocardiogram 5. Carotid dopplers 6. Prophylactic therapy-Antiplatelet med: Aspirin - dose 81 mg daily and plavix 75 mg 7. Risk factor modification 8. Tylenol for headaches as needed  Code Status: full Family Communication: updated at bedside Disposition Plan/Anticipated LOS: guarded  Time spent: 55 minutes  Lars Mage, MD  [facility] Team 5  If 7PM-7AM, please contact night-coverage at [email or web address], password [facility] 04/28/2013, 4:41 AM

## 2013-04-28 NOTE — Progress Notes (Signed)
Chaplain saw pt while rounding on 4 west.   Introduced spiritual care as resource and provided brief support around admission.   Pt, father and friend present in room.  Will continue to follow and assess for spiritual support needs.  Please page as needs arise.    Belva Crome MDiv

## 2013-04-28 NOTE — Progress Notes (Signed)
*  PRELIMINARY RESULTS* Vascular Ultrasound Carotid Duplex (Doppler) has been completed.  There is no obvious evidence of hemodynamically significant internal carotid artery stenosis. Vertebral arteries are patent with antegrade flow.  04/28/2013 12:36 PM Gertie Fey, RVT, RDCS, RDMS

## 2013-04-28 NOTE — ED Notes (Signed)
Pt states since Saturday night she has been feeling very tired and sleeping a lot  Pt states she has also had headache, taste buds not working right, ringing in her ears, dizziness, right eye has been bloodshot, blurred vision, and today her speech has been slurred

## 2013-04-28 NOTE — ED Provider Notes (Signed)
History     CSN: [id number]  Arrival date & time 04/28/13  0101   First MD Initiated Contact with Patient 04/28/13 [phone]      Chief Complaint  Patient presents with  . Hyperglycemia    (Consider location/radiation/quality/duration/timing/severity/associated sxs/prior treatment) HPI History provided by patient. Not feeling well for the last 2 days. Is an insulin-dependent diabetic. Has had some vague abdominal discomfort and some nausea but no vomiting. Today feeling dizzy.  Her blood sugar tonight before work was 250. She went to work and coworker noticed that she had slurred speech, R facial droop with increased dizziness and increased blurred vision and presents here for evaluation. She presents with blood sugar in the 400s. She denies any fevers or chills. No chest pain or shortness of breath. No abdominal pain at this time. Symptoms moderate in severity. Patient did not notice any slurred speech and per coworker bedside is now resolved.    Past Medical History  Diagnosis Date  . Nicotine addiction   . Anxiety   . IDDM (insulin dependent diabetes mellitus)   . Dyslipidemia   . [id number](956.2)     Past Surgical History  Procedure Laterality Date  . Laser left eye  Oct 01, 2008    4 treatment on left eye and 2 on the right     Family History  Problem Relation Age of Onset  . Hypertension Mother   . Diabetes Mother   . Hyperlipidemia Mother   . Rosacea Mother   . Hypertension Father     History  Substance Use Topics  . Smoking status: Former Games developer  . Smokeless tobacco: Not on file  . Alcohol Use: No    OB History   Grav Para Term Preterm Abortions TAB SAB Ect Mult Living                  Review of Systems  Constitutional: Negative for fever and chills.  HENT: Negative for neck pain and neck stiffness.   Respiratory: Negative for shortness of breath.   Cardiovascular: Negative for chest pain.  Gastrointestinal: Positive for nausea. Negative for vomiting.   Genitourinary: Negative for dysuria.  Musculoskeletal: Negative for back pain.  Skin: Negative for rash.  Neurological: Positive for dizziness. Negative for seizures and syncope.  All other systems reviewed and are negative.    Allergies  Review of patient's allergies indicates no known allergies.  Home Medications   Current Outpatient Rx  Name  Route  Sig  Dispense  Refill  . ergocalciferol (VITAMIN D2) 50000 UNITS capsule   Oral   Take 1 capsule (50,000 Units total) by mouth once a week. One capsule once weekly   12 capsule   1   . insulin aspart (NOVOLOG) 100 UNIT/ML injection   Subcutaneous   Inject 10 Units into the skin 3 (three) times daily before meals.   1 vial   12     Coverage only before breakfast and lunch   . insulin glargine (LANTUS) 100 UNIT/ML injection   Subcutaneous   Inject 65 Units into the skin daily.    10 mL   12   . omega-3 acid ethyl esters (LOVAZA) 1 G capsule   Oral   Take 1 g by mouth daily.         . pravastatin (PRAVACHOL) 40 MG tablet   Oral   Take 1 tablet (40 mg total) by mouth daily.   30 tablet   5     D/C pravastatin  80 mg tablet ($4 list)   . Ranibizumab (LUCENTIS) 0.3 MG/0.05ML SOLN   Intraocular   Inject 0.3 mg into the eye every 6 (six) weeks.         . terbinafine (LAMISIL) 250 MG tablet   Oral   Take 1 tablet (250 mg total) by mouth daily.   30 tablet   2     Dispense 30 day supply   . traMADol-acetaminophen (ULTRACET) 37.5-325 MG per tablet   Oral   Take 1 tablet by mouth every 4 (four) hours as needed for pain.   90 tablet   5     BP 145/90  Pulse 97  Temp(Src) 97.6 F (36.4 C) (Oral)  Resp 18  SpO2 100%  LMP 09/29/2011  Physical Exam  Nursing note and vitals reviewed. Constitutional: She is oriented to person, place, and time. She appears well-developed and well-nourished.  HENT:  Head: Normocephalic and atraumatic.  Mouth/Throat: Oropharynx is clear and moist. No oropharyngeal exudate.   Dry mucous membranes  Eyes: EOM are normal. Pupils are equal, round, and reactive to light. No scleral icterus.  Neck: Neck supple.  Cardiovascular: Normal rate, normal heart sounds and intact distal pulses.   Pulmonary/Chest: Effort normal and breath sounds normal. No respiratory distress.  Abdominal: Soft. Bowel sounds are normal. She exhibits no distension. There is no tenderness.  Musculoskeletal: Normal range of motion. She exhibits no edema.  Neurological: She is alert and oriented to person, place, and time. No cranial nerve deficit. Coordination normal.  Speech clear, no deficits  Skin: Skin is warm and dry.    ED Course  Procedures (including critical care time)  Results for orders placed during the hospital encounter of 04/28/13  GLUCOSE, CAPILLARY      Result Value Range   Glucose-Capillary 423 (*) 70 - 99 mg/dL  BLOOD GAS, ARTERIAL      Result Value Range   FIO2 0.21     Delivery systems ROOM AIR     pH, Arterial 7.394  7.350 - 7.450   pCO2 arterial 38.0  35.0 - 45.0 mmHg   pO2, Arterial 92.8  80.0 - 100.0 mmHg   Bicarbonate 22.9  20.0 - 24.0 mEq/L   TCO2 20.6  0 - 100 mmol/L   Acid-base deficit 1.4  0.0 - 2.0 mmol/L   O2 Saturation 97.3     Patient temperature 97.5     Collection site RIGHT RADIAL     Drawn by [phone]     Sample type ARTERIAL     Allens test (pass/fail) PASS  PASS  CBC      Result Value Range   WBC 9.6  4.0 - 10.5 K/uL   RBC 4.55  3.87 - 5.11 MIL/uL   Hemoglobin 13.1  12.0 - 15.0 g/dL   HCT 60.4  54.0 - 98.1 %   MCV 85.3  78.0 - 100.0 fL   MCH 28.8  26.0 - 34.0 pg   MCHC 33.8  30.0 - 36.0 g/dL   RDW 19.1  47.8 - 29.5 %   Platelets 216  150 - 400 K/uL  COMPREHENSIVE METABOLIC PANEL      Result Value Range   Sodium 130 (*) 135 - 145 mEq/L   Potassium 4.4  3.5 - 5.1 mEq/L   Chloride 95 (*) 96 - 112 mEq/L   CO2 23  19 - 32 mEq/L   Glucose, Bld 473 (*) 70 - 99 mg/dL   BUN 32 (*) 6 - 23 mg/dL  Creatinine, Ser 1.20 (*) 0.50 - 1.10 mg/dL    Calcium 9.4  8.4 - 14.7 mg/dL   Total Protein 6.7  6.0 - 8.3 g/dL   Albumin 3.3 (*) 3.5 - 5.2 g/dL   AST 21  0 - 37 U/L   ALT 24  0 - 35 U/L   Alkaline Phosphatase 98  39 - 117 U/L   Total Bilirubin 0.3  0.3 - 1.2 mg/dL   GFR calc non Af Amer 54 (*) >90 mL/min   GFR calc Af Amer 62 (*) >90 mL/min  URINALYSIS, ROUTINE W REFLEX MICROSCOPIC      Result Value Range   Color, Urine YELLOW  YELLOW   APPearance CLEAR  CLEAR   Specific Gravity, Urine 1.033 (*) 1.005 - 1.030   pH 5.5  5.0 - 8.0   Glucose, UA >1000 (*) NEGATIVE mg/dL   Hgb urine dipstick SMALL (*) NEGATIVE   Bilirubin Urine NEGATIVE  NEGATIVE   Ketones, ur NEGATIVE  NEGATIVE mg/dL   Protein, ur 829 (*) NEGATIVE mg/dL   Urobilinogen, UA 1.0  0.0 - 1.0 mg/dL   Nitrite NEGATIVE  NEGATIVE   Leukocytes, UA NEGATIVE  NEGATIVE  URINE MICROSCOPIC-ADD ON      Result Value Range   Squamous Epithelial / LPF RARE  RARE   WBC, UA 0-2  <3 WBC/hpf   RBC / HPF 3-6  <3 RBC/hpf  POCT I-STAT, CHEM 8      Result Value Range   Sodium 133 (*) 135 - 145 mEq/L   Potassium 4.5  3.5 - 5.1 mEq/L   Chloride 102  96 - 112 mEq/L   BUN 36 (*) 6 - 23 mg/dL   Creatinine, Ser 5.62 (*) 0.50 - 1.10 mg/dL   Glucose, Bld 130 (*) 70 - 99 mg/dL   Calcium, Ion 8.65  7.84 - 1.23 mmol/L   TCO2 24  0 - 100 mmol/L   Hemoglobin 13.6  12.0 - 15.0 g/dL   HCT 69.6  29.5 - 28.4 %  POCT PREGNANCY, URINE      Result Value Range   Preg Test, Ur NEGATIVE  NEGATIVE   Ct Head Wo Contrast  04/28/2013   *RADIOLOGY REPORT*  Clinical Data: Dizziness.  Headache.  CT HEAD WITHOUT CONTRAST  Technique:  Contiguous axial images were obtained from the base of the skull through the vertex without contrast.  Comparison: Head CT 01/26/2006.  Findings: No acute intracranial abnormalities.  Specifically, no evidence of acute intracranial hemorrhage, no definite findings of acute/subacute cerebral ischemia, no mass, mass effect, hydrocephalus or abnormal intra or extra-axial fluid  collections. Visualized paranasal sinuses and mastoids are well pneumatized.  No acute displaced skull fractures are identified.  IMPRESSION: 1.  No acute intracranial abnormalities. 2.  The appearance of the brain is normal.   Original Report Authenticated By: Trudie Reed, M.D.    3:56 AM d/w Dr Eben Burow, will admit, he requests NEU to see in am. I d/w DR Amada Jupiter, NEU will see in am. MRI pending   MDM  TIA symptoms in IDDM with hyperglycemia no DKA  Labs, imaging  IVFs, insulin provided  MED admit        Sunnie Nielsen, MD 04/28/13 [phone]

## 2013-04-28 NOTE — Progress Notes (Signed)
  Echocardiogram 2D Echocardiogram has been performed.  Prashant Glosser 04/28/2013, 10:24 AM 

## 2013-04-28 NOTE — Consult Note (Signed)
Neurology Consultation Reason for Consult: Facial droop and slurred speech Referring Physician: Dierdre Highman, B  CC: Slurred speech and facial droop  History is obtained from:Patient, coworker  HPI: Ashley Armstrong is a 47 y.o. female with a history of longstanding DM(diagnosied at age 56) who presents with 2 hours of facial droop and slurred speech earlier tonight. She states that she has not been feeling well for the past couple of days, and in fact did not go to work yesterday because of a sensation of generalized weakness. She states that tonight, her coworker noticed that her right face seemed off, and that she sounded like she was drunk. Her coworker states that the right side of face seemed like it was drooping, and that her smile did not seem normal. She states that this seems to have resolved.  LKW: TIA tpa given: no, symptoms resolved   ROS: A 14 point ROS was performed and is negative except as noted in the HPI.  Past Medical History  Diagnosis Date  . Nicotine addiction   . Anxiety   . IDDM (insulin dependent diabetes mellitus)   . Dyslipidemia   . Headache(784.0)     Family History: Brother-stroke at age 14  Social History: Tob: Quit smoking 1.5 years ago  Exam: Current vital signs: BP 145/90  Pulse 97  Temp(Src) 97.5 F (36.4 C) (Oral)  Resp 18  SpO2 100%  LMP 09/29/2011 Vital signs in last 24 hours: Temp:  [97.5 F (36.4 C)-97.6 F (36.4 C)] 97.5 F (36.4 C) (06/18 0448) Pulse Rate:  [97] 97 (06/18 0107) Resp:  [18] 18 (06/18 0107) BP: (145)/(90) 145/90 mmHg (06/18 0107) SpO2:  [100 %] 100 % (06/18 0107)  General: In bed, NAD CV: Regular rate and rhythm Mental Status: Patient is awake, alert, oriented to person, place, month, year, and situation. Immediate and remote memory are intact. Patient is able to give a clear and coherent history. Able to give # of quarters in $2.75.  No signs of aphasia or neglect Cranial Nerves: II: Visual Fields are full.  Pupils are equal, round, and reactive to light.  Discs are without papilledema.  III,IV, VI: Right eye is slightly externally deviated compared to left.  V: Facial sensation is symmetric to temperature VII: Facial movement is symmetric.  VIII: hearing is intact to voice X: Uvula elevates symmetrically XI: Shoulder shrug is symmetric. XII: tongue is midline without atrophy or fasciculations.  Motor: Tone is normal. Bulk is normal. 5/5 strength was present in all four extremities with the exception of mild guarding in her left arm do to rotator cuff injury.  Sensory: Sensation is symmetric to light touch and temperature in the arms and legs. She has distal loss of sensation below the ankles Deep Tendon Reflexes: 2+ and symmetric in the biceps absent at the patella and ankle Cerebellar: FNF  intact bilaterally Gait: Not assessed due to multiple medical monitors in ED setting.  I have reviewed labs in epic and the results pertinent to this consultation are: Elevated glucose - 473 Mildly elevated creatinine  I have reviewed the images obtained: CT head-no acute finding  Impression: 47 year old female with transient right facial droop and slurred speech. Though hyperglycemia can cause symptoms of focal nature, this typically occurs at higher levels. I feel that she is at high risk of TIA, and therefore would treat this event as TIA. Of note, she was taking a baby aspirin daily prior to admission.  Recommendations: 1. HgbA1c, fasting lipid panel 2. MRI,  MRA  of the brain without contrast 3. Frequent neuro checks 4. Echocardiogram 5. Carotid dopplers 6. Prophylactic therapy-Change asa to plavix 75mg  qday 7. Risk factor modification 8. Telemetry monitoring    Ritta Slot, MD [facility] [phone]  If 7pm- 7am, please page neurology on call at [phone].

## 2013-04-28 NOTE — Progress Notes (Signed)
Hypoglycemic Event  CBG: 52   Treatment: 15 GM carbohydrate snack  Symptoms: Sweaty  Follow-up CBG: Time:0730 CBG Result: 138  Possible Reasons for Event: Inadequate meal intake  Comments/MD notified: per protocol    Ashley Armstrong, Avyn Aden M  Remember to initiate Hypoglycemia Order Set & complete

## 2013-04-28 NOTE — Evaluation (Addendum)
Occupational Therapy Evaluation Patient Details Name: Ashley Armstrong MRN: [id number] DOB: 1966-10-22 Today's Date: 04/28/2013 Time: 4098-1191 OT Time Calculation (min): 27 min  OT Assessment / Plan / Recommendation Clinical Impression  Pt admitted for slurred speech and facial droop. She currently displays decreased balance with ADL and will benefit from education to improve safety with tasks and increase her independence. Educated pt on signs and symptoms of stroke. Handout in room. She is overall at min to min guard assist with ADL.     OT Assessment       Follow Up Recommendations  Outpatient OT;Supervision/Assistance - 24 hour (for L UE strengthening. Was receiving PTA)    Barriers to Discharge      Equipment Recommendations  Tub/shower seat (will further assess but currently will benefit from a shower seat)    Recommendations for Other Services    Frequency  Min 2X/week    Precautions / Restrictions Precautions Precautions: Fall Restrictions Weight Bearing Restrictions: No        ADL  Eating/Feeding: Independent Where Assessed - Eating/Feeding: Bed level Grooming: Wash/dry hands;Min guard Where Assessed - Grooming: Unsupported standing Upper Body Bathing: Chest;Right arm;Left arm;Abdomen;Set up Where Assessed - Upper Body Bathing: Unsupported sitting Lower Body Bathing: Min guard Where Assessed - Lower Body Bathing: Supported sit to stand Upper Body Dressing: Set up Where Assessed - Upper Body Dressing: Unsupported sitting Lower Body Dressing: Min guard Where Assessed - Lower Body Dressing: Supported sit to Pharmacist, hospital: Minimal assistance Toilet Transfer Method:  (no device into bathroom) Acupuncturist: Comfort height toilet Toileting - Clothing Manipulation and Hygiene: Simulated;Min guard Where Assessed - Toileting Clothing Manipulation and Hygiene: Sit to stand from 3-in-1 or toilet ADL Comments: Pt up to ambulate in hallway see PT notes)  and then to bathroom for toileting with OT. Pt did have some LOB/unsteadiness in hallway with head turns. See PT note for details. Pt may benefit from a shower seat for her shower stall until stronger and balance improved. She states her roommate can be with her all the time at d/c. She has questions regarding whether she can go on disability and her medicines. Encouraged her to speak with MD and express her concerns and questions. Pt educated by PT/OT on signs and symptoms of stroke and to get assist/come to hospital right away if she has these symptoms.     OT Diagnosis:    OT Problem List:   OT Treatment Interventions: Self-care/ADL training;DME and/or AE instruction;Patient/family education   OT Goals Acute Rehab OT Goals OT Goal Formulation: With patient Time For Goal Achievement: 05/12/13 Potential to Achieve Goals: Good ADL Goals Pt Will Perform Grooming: with supervision;Standing at sink ADL Goal: Grooming - Progress: Goal set today Pt Will Transfer to Toilet: with supervision;Regular height toilet;Ambulation (vanity PRN) ADL Goal: Toilet Transfer - Progress: Goal set today Pt Will Perform Toileting - Clothing Manipulation: with supervision;Standing ADL Goal: Toileting - Clothing Manipulation - Progress: Goal set today Pt Will Perform Tub/Shower Transfer: Shower transfer;with supervision;with DME ADL Goal: Tub/Shower Transfer - Progress: Goal set today Additional ADL Goal #1: Pt will gather all items for bath and dress including reaching into closets and higher cabinets and perfrom with supervision.  ADL Goal: Additional Goal #1 - Progress: Goal set today  Visit Information  Last OT Received On: 04/28/13 Assistance Needed: +1 PT/OT Co-Evaluation/Treatment: Yes    Subjective Data  Subjective: I was alittle unsteady to the bathroom earlier Patient Stated Goal: agreeable to work with  therapy; wants to return home to dog   Prior Functioning     Home Living Lives With: Other  (Comment) (roommate) Available Help at Discharge: Friend(s);Available 24 hours/day Type of Home: Mobile home Home Access: Stairs to enter Entrance Stairs-Number of Steps: 3 Entrance Stairs-Rails: Can reach both;Left;Right Home Layout: One level Bathroom Shower/Tub: Health visitor: Standard Home Adaptive Equipment: None Prior Function Level of Independence: Independent Able to Take Stairs?: Yes Driving: Yes Vocation: Full time employment Communication Communication: No difficulties (states she has some slurring, but better than it was) Dominant Hand: Right         Vision/Perception Vision - History Baseline Vision: Other (comment) (pt staes she undergoes treatments for both eyes) Patient Visual Report: No change from baseline   Cognition  Cognition Arousal/Alertness: Awake/alert Behavior During Therapy: WFL for tasks assessed/performed Overall Cognitive Status: Within Functional Limits for tasks assessed    Extremity/Trunk Assessment Right Upper Extremity Assessment RUE ROM/Strength/Tone: Deficits RUE ROM/Strength/Tone Deficits: shoulder flexion to about 150; elbow distal WFL but strength grossly 4/5. grip fair plus.  RUE Sensation: WFL - Light Touch (pt reports occassional numbness in digits, none currently) RUE Coordination: WFL - gross/fine motor Left Upper Extremity Assessment LUE ROM/Strength/Tone: Deficits LUE ROM/Strength/Tone Deficits: shoulder flexion to about 120 degrees; elbow to hand WFL; pt reports tennis elbow on L and note edema around elbow. Pt also reports L rotator cuff/frozen shoulder issues. Pt has been going to outpatient OT for L UE strengthening PTA. strength elbow distal grossly 4/5. didnt formally test shoulder; grip fair plus LUE Sensation: WFL - Light Touch LUE Coordination: WFL - gross/fine motor      Mobility Bed Mobility Bed Mobility: Supine to Sit Supine to Sit: 5: Supervision;HOB elevated Details for Bed Mobility  Assistance: Supervision for safety.  Transfers Sit to Stand: 4: Min guard;With upper extremity assist;From bed Stand to Sit: 4: Min guard;With upper extremity assist;To bed Details for Transfer Assistance: Limited use of BUEs (L more than R) due to frozen shoulder and increased swelling around elbow.  Min/guard for safety with cues for hand placement.      Exercise     Balance Balance Balance Assessed: Yes Static Standing Balance Static Standing - Level of Assistance: 4: Min assist (min guard) Dynamic Standing Balance Dynamic Standing - Level of Assistance: 4: Min assist (min to min guard)   End of Session OT - End of Session Activity Tolerance: Patient tolerated treatment well Patient left: in bed;with call bell/phone within reach;with family/visitor present  GO Functional Assessment Tool Used: clinical judgement Functional Limitation: Self care Self Care Current Status (Z6109): At least 20 percent but less than 40 percent impaired, limited or restricted Self Care Goal Status (U0454): At least 1 percent but less than 20 percent impaired, limited or restricted   Lennox Laity [phone] 04/28/2013, 12:58 PM

## 2013-04-28 NOTE — ED Notes (Signed)
MD at bedside. 

## 2013-04-28 NOTE — Discharge Summary (Addendum)
Physician Discharge Summary  COSTELLA SCHWARZ OZH:[id number] DOB: 1966/06/26 DOA: 04/28/2013  PCP: Syliva Overman, MD  Admit date: 04/28/2013 Discharge date: 04/28/2013  Time spent: 25 minutes  Recommendations for Outpatient Follow-up:  1. Home with outpt PT/OT  Discharge Diagnoses:   Principal Problem:   TIA (transient ischemic attack)  Active Problems:   Diabetes mellitus, insulin dependent (IDDM), uncontrolled   Generalized anxiety disorder   Gastroparesis   Unspecified vitamin D deficiency   Chronic kidney disease   Discharge Condition: fair  Diet recommendation: diabetic  Filed Weights   04/28/13 0604  Weight: 54.069 kg (119 lb 3.2 oz)    History of present illness:  Please refer to Admission H&P for details but in brief, 47 year old female the past medical history of poorly controlled  diabetes, dyslipidemia and anxiety disorder presented with slurred speech, dizziness and bloody vision for 2 days. Patient had a significant frontal headache when she woke up one day prior to admission without any aggravating or alleviating factors. After work her coworkers noticed that she had slurred speech with right facial droop. She had increased dizziness and blurred vision. Patient then came to rest in the hospital where she was found to have a blood sugar in 400s. Head CT on admission was unremarkable. Patient admitted to medical floor under observation for concern of TIA.  Hospital Course:  TIA Head CT unremarkable 2-D echo and carotid Dopplers negative. Patient was taking baby aspirin at home and after neurology evaluation recommended switching her to Plavix as her symptoms were consistent with TIA. Her symptoms resolved overnight. An MRI/MRA of the brain was done which was unremarkable for any ischemia or infarct. Hemoglobin A1c was 9.5. Patient is on Lantus 20 units along with sliding scale insulin at home and I would increase the dose to 25 units daily. Her fingersticks have  been fluctuating between 56-250 this morning. -Also noted to have a deviated total cholesterol, triglycerides and LDL. Patient is on pravastatin at home and I would double the dose to 80 mg once daily. -Patient clinically stable can be discharged home with outpatient PCP followup. Patient counseled on regular exercise, dietary and medication compliance and routine outpatient followup. -Patient is seen by PT/OT. She has outpatient followup with PT and [facility] for her left rotator cuff injury.  Hyperlipidemia Imaging data total cholesterol, triglycerides and LDH. I have doubled her dose of pravastatin to 80 mg daily.  Uncontrolled diabetes mellitus Hemoglobin A1c of 9.5. I will increase her dose of Lantus 25 units daily. Continue with sliding scale insulin at home. Needs close outpatient monitoring of her blood glucose. I did not increase her Lantus further as her fsg was 56 this morning.  Mild CKD Baseline unknown. Creatinine of 1.3. likely in the setting of early diabetic nephropathy. -Patient also has mild proteinuria on urinalysis and I have added a low dose lisinopril.  Procedures: None Consultations:  Neurology  Discharge Exam: Filed Vitals:   04/28/13 0107 04/28/13 0448 04/28/13 0604  BP: 145/90  124/77  Pulse: 97  84  Temp: 97.6 F (36.4 C) 97.5 F (36.4 C) 98.1 F (36.7 C)  TempSrc: Oral  Oral  Resp: 18  16  Height:   5' 7.5" (1.715 m)  Weight:   54.069 kg (119 lb 3.2 oz)  SpO2: 100%  100%    General: Middle aged thin built female in no acute distress HEENT: No pallor, moist oral mucosa Chest: Clear to auscultation bilaterally, no added sounds CVS: Normal S1 and S2, no  murmurs rub or gallop Abdomen: Soft, nontender, nondistended, bowel sounds present Extremities: Warm, no edema CNS: AAO x3, nonfocal  Discharge Instructions   Future Appointments Provider Department Dept Phone   05/03/2013 9:30 AM Jeanene Erb, OT [facility]  [phone]   05/05/2013 9:30 AM Charisse March Essenmacher, OT [facility] [phone]   05/10/2013 9:30 AM Charisse March Essenmacher, OT [facility] [phone]   05/11/2013 9:30 AM Jeanene Erb, OT [facility] [phone]   05/17/2013 9:30 AM Charisse March Essenmacher, OT [facility] [phone]   05/19/2013 9:30 AM Charisse March Essenmacher, OT [facility] [phone]   07/20/2013 8:30 AM Kerri Perches, MD [facility] [phone]       Medication List    TAKE these medications       clopidogrel 75 MG tablet  Commonly known as:  PLAVIX  Take 1 tablet (75 mg total) by mouth daily with breakfast.     ergocalciferol 50000 UNITS capsule  Commonly known as:  VITAMIN D2  Take 1 capsule (50,000 Units total) by mouth once a week. One capsule once weekly     insulin aspart 100 UNIT/ML injection  Commonly known as:  novoLOG  Inject 10 Units into the skin 3 (three) times daily before meals.     insulin glargine 100 UNIT/ML injection  Commonly known as:  LANTUS  Inject 0.25 mLs (25 Units total) into the skin daily. Inject 25 units daily     lisinopril 10 MG tablet  Commonly known as:  ZESTRIL  Take 1 tablet (10 mg total) by mouth daily.     LUCENTIS 0.3 MG/0.05ML Soln  Generic drug:  Ranibizumab  Inject 0.3 mg into the eye every 6 (six) weeks.     omega-3 acid ethyl esters 1 G capsule  Commonly known as:  LOVAZA  Take 1 g by mouth daily.     pravastatin 40 MG tablet  Commonly known as:  PRAVACHOL  Take 2 tablets (80 mg total) by mouth daily.     terbinafine 250 MG tablet  Commonly known as:  LAMISIL  Take 1 tablet (250 mg total) by mouth daily.     traMADol-acetaminophen 37.5-325 MG per tablet  Commonly known as:  ULTRACET  Take 1 tablet by mouth every 4 (four) hours as needed for pain.       No Known Allergies     Follow-up Information   Follow  up with Syliva Overman, MD In 1 week.   Contact information:   [address], [address] [place] [address] [phone]        The results of significant diagnostics from this hospitalization (including imaging, microbiology, ancillary and laboratory) are listed below for reference.    Significant Diagnostic Studies: Ct Head Wo Contrast  04/28/2013   *RADIOLOGY REPORT*  Clinical Data: Dizziness.  Headache.  CT HEAD WITHOUT CONTRAST  Technique:  Contiguous axial images were obtained from the base of the skull through the vertex without contrast.  Comparison: Head CT 01/26/2006.  Findings: No acute intracranial abnormalities.  Specifically, no evidence of acute intracranial hemorrhage, no definite findings of acute/subacute cerebral ischemia, no mass, mass effect, hydrocephalus or abnormal intra or extra-axial fluid collections. Visualized paranasal sinuses and mastoids are well pneumatized.  No acute displaced skull fractures are identified.  IMPRESSION: 1.  No acute intracranial abnormalities. 2.  The appearance of the brain is normal.   Original Report Authenticated By:  Trudie Reed, M.D.   Mr Maxine Glenn Head Wo Contrast  04/28/2013   *RADIOLOGY REPORT*  Clinical Data:  Diabetic hyperlipidemic patient presenting with transient right facial droop and slurred speech.  MRI BRAIN WITHOUT CONTRAST MRA HEAD WITHOUT CONTRAST  Technique: Multiplanar, multiecho pulse sequences of the brain and surrounding structures were obtained according to standard protocol without intravenous contrast.  Angiographic images of the head were obtained using MRA technique without contrast.  Comparison: 04/29/2013 head CT.  No comparison brain MR.  MRI HEAD  Findings:  Motion degraded exam.  No acute infarct.  No intracranial hemorrhage.  No hydrocephalus.  No intracranial mass lesion detected on this unenhanced exam.  Cervical medullary junction, pituitary region, pineal region and orbital structures unremarkable.  Minimal  paranasal sinus mucosal thickening.  Major intracranial vascular structures are patent.  IMPRESSION: No acute infarct.  MRA HEAD  Findings: Mild motion degradation.  Anterior circulation without medium or large size vessel significant stenosis or occlusion.  Right vertebral artery dominant in size.  No significant narrowing of the distal vertebral arteries or basilar artery.  Nonvisualization left AICA.  Diminutive size right AICA.  Mild irregularity of the superior cerebellar arteries and posterior cerebral arteries (more notable on the left) may be partially explained by motion as versus result of atherosclerotic type changes.  At the level of the left posterior cerebral artery/posterior communicating artery, redundant vasculature is noted but without evidence of aneurysm or vascular malformation.  IMPRESSION: No evidence of medium or large size vessel significant stenosis or occlusion.  Please see above.   Original Report Authenticated By: Lacy Duverney, M.D.   Mri Brain Without Contrast  04/28/2013   *RADIOLOGY REPORT*  Clinical Data:  Diabetic hyperlipidemic patient presenting with transient right facial droop and slurred speech.  MRI BRAIN WITHOUT CONTRAST MRA HEAD WITHOUT CONTRAST  Technique: Multiplanar, multiecho pulse sequences of the brain and surrounding structures were obtained according to standard protocol without intravenous contrast.  Angiographic images of the head were obtained using MRA technique without contrast.  Comparison: 04/29/2013 head CT.  No comparison brain MR.  MRI HEAD  Findings:  Motion degraded exam.  No acute infarct.  No intracranial hemorrhage.  No hydrocephalus.  No intracranial mass lesion detected on this unenhanced exam.  Cervical medullary junction, pituitary region, pineal region and orbital structures unremarkable.  Minimal paranasal sinus mucosal thickening.  Major intracranial vascular structures are patent.  IMPRESSION: No acute infarct.  MRA HEAD  Findings: Mild  motion degradation.  Anterior circulation without medium or large size vessel significant stenosis or occlusion.  Right vertebral artery dominant in size.  No significant narrowing of the distal vertebral arteries or basilar artery.  Nonvisualization left AICA.  Diminutive size right AICA.  Mild irregularity of the superior cerebellar arteries and posterior cerebral arteries (more notable on the left) may be partially explained by motion as versus result of atherosclerotic type changes.  At the level of the left posterior cerebral artery/posterior communicating artery, redundant vasculature is noted but without evidence of aneurysm or vascular malformation.  IMPRESSION: No evidence of medium or large size vessel significant stenosis or occlusion.  Please see above.   Original Report Authenticated By: Lacy Duverney, M.D.   Dg Shoulder Left  04/15/2013   2 views left shoulder AP and lateral for left shoulder pain possible  rotator cuff syndrome the glenohumeral joint looks absolutely normal the  scapular Y-view shows a type II acromion  The bone may be slightly osteopenic, the greater tuberosity shows mild  cystic and sclerotic changes.  Impression normal shoulder x-ray with greater tuberosity sclerotic changes  consistent with chronic rotator cuff tension sign   Microbiology: No results found for this or any previous visit (from the past 240 hour(s)).   Labs: Basic Metabolic Panel:  Recent Labs Lab 04/28/13 0220 04/28/13 0226  NA 130* 133*  K 4.4 4.5  CL 95* 102  CO2 23  --   GLUCOSE 473* 456*  BUN 32* 36*  CREATININE 1.20* 1.30*  CALCIUM 9.4  --    Liver Function Tests:  Recent Labs Lab 04/28/13 0220  AST 21  ALT 24  ALKPHOS 98  BILITOT 0.3  PROT 6.7  ALBUMIN 3.3*   No results found for this basename: LIPASE, AMYLASE,  in the last 168 hours No results found for this basename: AMMONIA,  in the last 168 hours CBC:  Recent Labs Lab 04/28/13 0220 04/28/13 0226  WBC 9.6  --    HGB 13.1 13.6  HCT 38.8 40.0  MCV 85.3  --   PLT 216  --    Cardiac Enzymes: No results found for this basename: CKTOTAL, CKMB, CKMBINDEX, TROPONINI,  in the last 168 hours BNP: BNP (last 3 results) No results found for this basename: PROBNP,  in the last 8760 hours CBG:  Recent Labs Lab 04/28/13 0420 04/28/13 0603 04/28/13 0701 04/28/13 0732 04/28/13 1202  GLUCAP 295* 107* 56* 138* 278*       Signed:  Leidi Astle  [facility] 04/28/2013, 2:07 PM

## 2013-04-28 NOTE — Evaluation (Signed)
Physical Therapy Evaluation Patient Details Name: Ashley Armstrong MRN: [id number] DOB: Feb 18, 1966 Today's Date: 04/28/2013 Time: 4098-1191 PT Time Calculation (min): 28 min  PT Assessment / Plan / Recommendation Clinical Impression  Pt presents with TIA and history of uncontrolled diabetes.  Tolerated OOB and ambulation in hallway, however did note moderate fall risk when ambulating and performing head turns and required mod assist to correct and prevent full LOB.  Noted no focal strength deficits but did note decreased hip flexors bilaterally.  Pt had many questions regarding TIA and symptoms that come with this.  PT explained signs and symptoms to look for, to call for help immediately and what risk factors are involved.  Noted eduction handout in room.  Pt wil benefit from skilled PT in acute venue to address deficits.      PT Assessment  Patient needs continued PT services    Follow Up Recommendations  Outpatient PT;Supervision for mobility/OOB    Does the patient have the potential to tolerate intense rehabilitation      Barriers to Discharge None      Equipment Recommendations   (TBD)    Recommendations for Other Services     Frequency Min 4X/week    Precautions / Restrictions Precautions Precautions: Fall Restrictions Weight Bearing Restrictions: No   Pertinent Vitals/Pain Some pain in L shoulder      Mobility  Bed Mobility Bed Mobility: Supine to Sit Supine to Sit: 5: Supervision;HOB elevated Details for Bed Mobility Assistance: Supervision for safety.  Transfers Transfers: Sit to Stand;Stand to Sit Sit to Stand: 4: Min guard;With upper extremity assist;From bed Stand to Sit: 4: Min guard;With upper extremity assist;To bed Details for Transfer Assistance: Limited use of BUEs (L more than R) due to frozen shoulder and increased swelling around elbow.  Min/guard for safety with cues for hand placement.  Ambulation/Gait Ambulation/Gait Assistance: 3: Mod assist;4:  Min assist Ambulation Distance (Feet): 500 Feet Assistive device: None Ambulation/Gait Assistance Details: Pt mostly min assist when ambulating in straight path, however when PT had pt perform head turns side to side, pt had LOB and required mod assist to stabalize.   Gait Pattern: Step-through pattern;Decreased stride length;Ataxic Gait velocity: decreased Stairs: No Wheelchair Mobility Wheelchair Mobility: No    Exercises     PT Diagnosis: Difficulty walking;Abnormality of gait;Generalized weakness  PT Problem List: Decreased strength;Decreased balance;Decreased mobility;Decreased coordination;Decreased safety awareness;Decreased knowledge of use of DME;Decreased knowledge of precautions PT Treatment Interventions: DME instruction;Gait training;Stair training;Functional mobility training;Therapeutic activities;Therapeutic exercise;Balance training;Patient/family education   PT Goals Acute Rehab PT Goals PT Goal Formulation: With patient Time For Goal Achievement: 05/05/13 Potential to Achieve Goals: Good Pt will go Sit to Stand: Independently PT Goal: Sit to Stand - Progress: Goal set today Pt will go Stand to Sit: Independently PT Goal: Stand to Sit - Progress: Goal set today Pt will Ambulate: >150 feet;with supervision;with least restrictive assistive device PT Goal: Ambulate - Progress: Goal set today Pt will Go Up / Down Stairs: 3-5 stairs;with rail(s);with supervision PT Goal: Up/Down Stairs - Progress: Goal set today Pt will Perform Home Exercise Program: with supervision, verbal cues required/provided PT Goal: Perform Home Exercise Program - Progress: Goal set today Additional Goals Additional Goal #1: Pt will verbalize signs and symptoms of TIA/CVA PT Goal: Additional Goal #1 - Progress: Goal set today  Visit Information  Last PT Received On: 04/28/13 Assistance Needed: +1    Subjective Data  Subjective: I think I want to go ahead and file for  disability. Patient  Stated Goal: to return home.    Prior Functioning  Home Living Lives With: Other (Comment) (roommate) Available Help at Discharge: Friend(s);Available 24 hours/day Type of Home: Mobile home Home Access: Stairs to enter Entrance Stairs-Number of Steps: 3 Entrance Stairs-Rails: Can reach both;Left;Right Home Layout: One level Bathroom Shower/Tub: Health visitor: Standard Home Adaptive Equipment: None Prior Function Level of Independence: Independent Able to Take Stairs?: Yes Driving: Yes Vocation: Full time employment Communication Communication: No difficulties (states she has some slurring, but better than it was) Dominant Hand: Right    Cognition  Cognition Arousal/Alertness: Awake/alert Behavior During Therapy: WFL for tasks assessed/performed Overall Cognitive Status: Within Functional Limits for tasks assessed    Extremity/Trunk Assessment Right Lower Extremity Assessment RLE ROM/Strength/Tone: Deficits RLE ROM/Strength/Tone Deficits: hip flex 3+/5, knee extension 4/5, knee flex 4/5, ankle DF/PF WFL RLE Sensation: History of peripheral neuropathy Left Lower Extremity Assessment LLE ROM/Strength/Tone: Deficits LLE ROM/Strength/Tone Deficits: hip flex 3+/5, knee ext 5/5, knee flex 4+/5, ankle DF/PF WFL LLE Sensation: History of peripheral neuropathy Trunk Assessment Trunk Assessment: Normal   Balance    End of Session PT - End of Session Activity Tolerance: Patient tolerated treatment well;Patient limited by fatigue Patient left: in bed;with call bell/phone within reach;with family/visitor present Nurse Communication: Mobility status  GP Functional Assessment Tool Used: clinical judgement Functional Limitation: Mobility: Walking and moving around Mobility: Walking and Moving Around Current Status (W0981): At least 20 percent but less than 40 percent impaired, limited or restricted Mobility: Walking and Moving Around Goal Status [phone]): At least 1  percent but less than 20 percent impaired, limited or restricted   Vista Deck 04/28/2013, 12:41 PM

## 2013-04-29 NOTE — Progress Notes (Signed)
Discharge summary sent to payer through [facility]  

## 2013-04-30 ENCOUNTER — Emergency Department (HOSPITAL_COMMUNITY)
Admission: EM | Admit: 2013-04-30 | Discharge: 2013-04-30 | Disposition: A | Discharge status: Home / Self Care | Payer: BC Managed Care – PPO | Attending: Emergency Medicine | Admitting: Emergency Medicine

## 2013-04-30 ENCOUNTER — Encounter: Payer: Self-pay | Admitting: Family Medicine

## 2013-04-30 ENCOUNTER — Ambulatory Visit (INDEPENDENT_AMBULATORY_CARE_PROVIDER_SITE_OTHER): Payer: BC Managed Care – PPO | Admitting: Family Medicine

## 2013-04-30 ENCOUNTER — Encounter (HOSPITAL_COMMUNITY): Payer: Self-pay | Admitting: *Deleted

## 2013-04-30 VITALS — BP 120/72 | HR 93 | Resp 16 | Ht 68.0 in | Wt 117.1 lb

## 2013-04-30 DIAGNOSIS — F411 Generalized anxiety disorder: Secondary | ICD-10-CM

## 2013-04-30 DIAGNOSIS — R42 Dizziness and giddiness: Secondary | ICD-10-CM | POA: Insufficient documentation

## 2013-04-30 DIAGNOSIS — R209 Unspecified disturbances of skin sensation: Secondary | ICD-10-CM | POA: Insufficient documentation

## 2013-04-30 DIAGNOSIS — G459 Transient cerebral ischemic attack, unspecified: Secondary | ICD-10-CM

## 2013-04-30 DIAGNOSIS — N189 Chronic kidney disease, unspecified: Secondary | ICD-10-CM

## 2013-04-30 DIAGNOSIS — E1129 Type 2 diabetes mellitus with other diabetic kidney complication: Secondary | ICD-10-CM

## 2013-04-30 DIAGNOSIS — Z794 Long term (current) use of insulin: Secondary | ICD-10-CM | POA: Insufficient documentation

## 2013-04-30 DIAGNOSIS — E119 Type 2 diabetes mellitus without complications: Secondary | ICD-10-CM | POA: Insufficient documentation

## 2013-04-30 DIAGNOSIS — E785 Hyperlipidemia, unspecified: Secondary | ICD-10-CM

## 2013-04-30 DIAGNOSIS — Z87891 Personal history of nicotine dependence: Secondary | ICD-10-CM | POA: Insufficient documentation

## 2013-04-30 DIAGNOSIS — Z862 Personal history of diseases of the blood and blood-forming organs and certain disorders involving the immune mechanism: Secondary | ICD-10-CM | POA: Insufficient documentation

## 2013-04-30 DIAGNOSIS — Z8673 Personal history of transient ischemic attack (TIA), and cerebral infarction without residual deficits: Secondary | ICD-10-CM | POA: Insufficient documentation

## 2013-04-30 DIAGNOSIS — E1065 Type 1 diabetes mellitus with hyperglycemia: Secondary | ICD-10-CM

## 2013-04-30 DIAGNOSIS — R51 Headache: Secondary | ICD-10-CM | POA: Insufficient documentation

## 2013-04-30 DIAGNOSIS — Z8639 Personal history of other endocrine, nutritional and metabolic disease: Secondary | ICD-10-CM | POA: Insufficient documentation

## 2013-04-30 DIAGNOSIS — Z79899 Other long term (current) drug therapy: Secondary | ICD-10-CM | POA: Insufficient documentation

## 2013-04-30 HISTORY — DX: Transient cerebral ischemic attack, unspecified: G45.9

## 2013-04-30 LAB — GLUCOSE, CAPILLARY: Glucose-Capillary: 245 mg/dL — ABNORMAL HIGH (ref 70–99)

## 2013-04-30 LAB — CBC WITH DIFFERENTIAL/PLATELET
Basophils Absolute: 0.1 10*3/uL (ref 0.0–0.1)
Basophils Relative: 1 % (ref 0–1)
Eosinophils Absolute: 0.1 10*3/uL (ref 0.0–0.7)
MCHC: 34.3 g/dL (ref 30.0–36.0)
Neutro Abs: 6.1 10*3/uL (ref 1.7–7.7)
Neutrophils Relative %: 71 % (ref 43–77)
RDW: 13 % (ref 11.5–15.5)

## 2013-04-30 LAB — BASIC METABOLIC PANEL
Chloride: 102 mEq/L (ref 96–112)
Creatinine, Ser: 0.81 mg/dL (ref 0.50–1.10)
GFR calc Af Amer: >90 mL/min (ref >90)
GFR calc non Af Amer: 86 mL/min — ABNORMAL LOW (ref >90)
Potassium: 4.2 mEq/L (ref 3.5–5.1)

## 2013-04-30 MED ORDER — KETOROLAC TROMETHAMINE 30 MG/ML IJ SOLN
30.0000 mg | Freq: Once | INTRAMUSCULAR | Status: AC
  Administered 2013-04-30: 30 mg via INTRAVENOUS
  Filled 2013-04-30: qty 1

## 2013-04-30 MED ORDER — METOCLOPRAMIDE HCL 5 MG/ML IJ SOLN
10.0000 mg | Freq: Once | INTRAMUSCULAR | Status: AC
  Administered 2013-04-30: 10 mg via INTRAVENOUS
  Filled 2013-04-30: qty 2

## 2013-04-30 MED ORDER — DIPHENHYDRAMINE HCL 50 MG/ML IJ SOLN
25.0000 mg | Freq: Once | INTRAMUSCULAR | Status: AC
  Administered 2013-04-30: 25 mg via INTRAVENOUS
  Filled 2013-04-30: qty 1

## 2013-04-30 MED ORDER — MECLIZINE HCL 25 MG PO TABS: 25.0000 mg | ORAL_TABLET | Freq: Four times a day (QID) | ORAL | Status: DC | PRN

## 2013-04-30 MED ORDER — LISINOPRIL 2.5 MG PO TABS: 2.5000 mg | ORAL_TABLET | Freq: Every day | ORAL | Status: DC

## 2013-04-30 MED ORDER — SODIUM CHLORIDE 0.9 % IV BOLUS (SEPSIS)
1000.0000 mL | Freq: Once | INTRAVENOUS | Status: AC
  Administered 2013-04-30: 1000 mL via INTRAVENOUS

## 2013-04-30 NOTE — ED Provider Notes (Signed)
History    This chart was scribed for [name], MD by Quintella Reichert, ED scribe.  This patient was seen in room APA18/APA18 and the patient's care was started at 7:09 PM.   CSN: [id number]  Arrival date & time 04/30/13  1819     Chief Complaint  Patient presents with  . Numbness  . Headache  . Dizziness     The history is provided by the patient. No language interpreter was used.    HPI Comments: Ashley Armstrong is a 47 y.o. female with h/o poorly-controlled type 1 diabetes, dyslipidemia, and recent possible TIA diagnosis who presents to the Emergency Department complaining of headache that began on waking this morning, with accompanying dizziness and numbness.  Pain is characterized as a constant, moderate pressure headache located at the top of the head and both temples that radiates down into both ears.  It is not aggravated or relieved by anything.  She also complains of numbness to her finger tips and the top of her head that began an hour ago.  Pt was discharged from [facility] 2 days ago after being hospitalized overnight for symptoms determined to be consistent with a TIA.  However her head CT, head MRI, EKG, and doppler studies of her carotid arteries were all normal.  She denies any other unique symptoms since that time.  Pt also has chronically limited ROM to the right arm secondary to a rotator cuff injury, as well as nerve damage to kidneys, feet and eyes secondary to DM.  She receives Lucentis injections in her eyes every 2 weeks.  She has had DM since she was 47 years old.  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  On admission her blood glucose is 245.   Past Medical History  Diagnosis Date  . Nicotine addiction   . Anxiety   . IDDM (insulin dependent diabetes mellitus)   . Dyslipidemia   . Headache(784.0)   . TIA (transient ischemic attack)     Past Surgical History  Procedure Laterality Date  . Laser  left eye  Oct 01, 2008    4 treatment on left eye and 2 on the right     Family History  Problem Relation Age of Onset  . Hypertension Mother   . Diabetes Mother   . Hyperlipidemia Mother   . Rosacea Mother   . Hypertension Father     History  Substance Use Topics  . Smoking status: Former Games developer  . Smokeless tobacco: Not on file  . Alcohol Use: No    OB History   Grav Para Term Preterm Abortions TAB SAB Ect Mult Living                  Review of Systems A complete 10 system review of systems was obtained and all systems are negative except as noted in the HPI and PMH.    Allergies  Review of patient's allergies indicates no known allergies.  Home Medications   Current Outpatient Rx  Name  Route  Sig  Dispense  Refill  . clopidogrel (PLAVIX) 75 MG tablet   Oral   Take 1 tablet (75 mg total) by mouth daily with breakfast.   30 tablet   2   . ergocalciferol (VITAMIN D2) 50000 UNITS capsule   Oral   Take 1 capsule (50,000 Units total) by mouth once a week. One capsule once weekly   12 capsule  1   . insulin aspart (NOVOLOG) 100 UNIT/ML injection   Subcutaneous   Inject 10 Units into the skin 3 (three) times daily before meals.   1 vial   12     Coverage only before breakfast and lunch   . insulin glargine (LANTUS) 100 UNIT/ML injection   Subcutaneous   Inject 0.25 mLs (25 Units total) into the skin daily. Inject 25 units daily   10 mL   0   . lisinopril (ZESTRIL) 2.5 MG tablet   Oral   Take 1 tablet (2.5 mg total) by mouth daily.   30 tablet   11   . omega-3 acid ethyl esters (LOVAZA) 1 G capsule   Oral   Take 1 g by mouth daily.         . pravastatin (PRAVACHOL) 80 MG tablet   Oral   Take 1 tablet (80 mg total) by mouth every evening.   30 tablet   11   . Ranibizumab (LUCENTIS) 0.3 MG/0.05ML SOLN   Intraocular   Inject 0.3 mg into the eye every 6 (six) weeks.         . terbinafine (LAMISIL) 250 MG tablet   Oral   Take 1 tablet  (250 mg total) by mouth daily.   30 tablet   2     Dispense 30 day supply   . traMADol-acetaminophen (ULTRACET) 37.5-325 MG per tablet   Oral   Take 1 tablet by mouth every 4 (four) hours as needed for pain.   90 tablet   5     BP 157/84  Pulse 91  Temp(Src) 98.1 F (36.7 C) (Oral)  Resp 20  Ht 5' 7.5" (1.715 m)  Wt 117 lb (53.071 kg)  BMI 18.04 kg/m2  SpO2 100%  LMP 09/29/2011  Physical Exam  Nursing note and vitals reviewed. Constitutional: She is oriented to person, place, and time. She appears well-developed and well-nourished.  HENT:  Head: Normocephalic and atraumatic.  Eyes: Conjunctivae and EOM are normal. Pupils are equal, round, and reactive to light.  Neck: Normal range of motion. Neck supple.  Cardiovascular: Normal rate, regular rhythm and normal heart sounds.   Pulmonary/Chest: Effort normal and breath sounds normal.  Abdominal: Soft. Bowel sounds are normal.  Neurological: She is alert and oriented to person, place, and time.  Skin: Skin is warm and dry.  Psychiatric: She has a normal mood and affect.    ED Course  Procedures (including critical care time)  DIAGNOSTIC STUDIES: Oxygen Saturation is 100% on room air, normal by my interpretation.    COORDINATION OF CARE: 7:19 PM-Discussed treatment plan which includes IV Toradol, Reglan and Benadryl with pt at bedside and pt agreed to plan.    Results for orders placed during the hospital encounter of 04/30/13  GLUCOSE, CAPILLARY      Result Value Range   Glucose-Capillary 245 (*) 70 - 99 mg/dL    No diagnosis found.    MDM  Normal physical exam. Glucose slightly elevated.  Patient feeling better after IV fluids, Toradol, Reglan, Benadryl. Discharge meds meclizine      I personally performed the services described in this documentation, which was scribed in my presence. The recorded information has been reviewed and is accurate.    [name], MD 04/30/13 2124

## 2013-04-30 NOTE — Assessment & Plan Note (Signed)
Uncontrolled diabetic , needs endocrinology f/u for stabilization and control, blood sugar undr 80 in the office was given glucose tablet, she had c/o feeling weak and shaky  Appt to be made with endo office after July 1 when it opens, she has been referred in the past but did not follow through

## 2013-04-30 NOTE — Assessment & Plan Note (Signed)
Increased anxiety symptoms due to recent hospitalization

## 2013-04-30 NOTE — Patient Instructions (Addendum)
F/u in 3 weeks, call if you need me before  You need to call Dr Isidoro Donning offic eon July 1 for your appointment, you were referred since May, and they received the paperwork. If you have difficulty with this, come in so we can help you   You are being referred to neurologist for f/u since you continue to be symptomatic   Return to work should be delayed for the next 3 weeks at least, and I will suggest that you be placed on first shift  Pravstatin dose remains at 80mg  daily  Lisinopril dose is decreased and HAS BEEN SENT iN  To 2.5 mg daily  If you develop worsening headache, numbness or speech disturbance or any symptoms suggestive of a stroke return immediately to the ED

## 2013-04-30 NOTE — Progress Notes (Signed)
  Subjective:    Patient ID: Ashley Armstrong, female    DOB: Dec 17, 1965, 47 y.o.   MRN: [id number]  HPI Pt states last Monday she woke up with palpitations, ringing in her ears, headache, and funny taste In her mouth She was taken to the hospital from her job the morning of Wednesday 06/18, she was hospitalized overnight with a diagnosis of TIA Today she is here requesting a return to work note, but remains unstable, c/o still of headache, speech difficulty, ears feel stopped up, memory and focus are off Blod sugars remain uncontrolled, and she still has not seen the endocrinologist to whom she was referred since May. 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 Denies sinus pressure, nasal congestion, ear pain or sore throat. Denies chest congestion, productive cough or wheezing. Denies chest pains,  PND, orthopnea and leg swelling  Denies abdominal pain, nausea, vomiting,diarrhea or constipation.   Denies dysuria, frequency, hesitancy or incontinence. Denies joint pain, swelling and limitation in mobility.   c/o depression and  Anxiety, health is not good, and her finances are shaky Denies skin break down or rash.        Objective:   Physical Exam  Patient alert and oriented and in no cardiopulmonary distress.Anxious, chrionically ill appearing  HEENT: No facial asymmetry, EOMI, no sinus tenderness,  oropharynx pink and moist.  Neck supple no adenopathy.  Chest: Clear to auscultation bilaterally.  CVS: S1, S2 no murmurs, no S3.  ABD: Soft non tender. Bowel sounds normal.  Ext: No edema  MS: Adequate ROM spine, shoulders, hips and knees.  Skin: Intact, no ulcerations or rash noted.  Psych: Good eye contact, normal affect. Memory intact not anxious or depressed appearing.  CNS: CN 2-12 intact, power, tone and sensation  normal throughout.       Assessment & Plan:

## 2013-04-30 NOTE — Assessment & Plan Note (Signed)
MRI brain and carotid doppler show no significant disease. Pt remains at high risk for stroke, due to lonngstanding uncontrolled IDDM and hyperlipidemia. Her sibling, with similar disease, had a stroke under the age of 48 recently She is to take plavix and due to ongoing symptoms is advised to stay out of work and establish with a neurologist, referral is made

## 2013-04-30 NOTE — Assessment & Plan Note (Signed)
Lower dose of lisnopril prescribed, pt ha s had light headedness in the past

## 2013-04-30 NOTE — ED Notes (Signed)
Pt states she was d/c from [facility] for TIA on Wednesday. States she was seen by Dr. Lodema Hong today at 1000. Pt states headache since waking this morning. Numbness to finger tips and top of head began an hour ago. Pt states she is very irritable. Also states dry mouth. CBG 245 in triage. Pt also states chills at times.

## 2013-04-30 NOTE — Assessment & Plan Note (Signed)
Hyperlipidemia:Low fat diet discussed and encouraged.  Continue pravachol 80 mg

## 2013-05-03 ENCOUNTER — Ambulatory Visit (HOSPITAL_COMMUNITY)
Admission: RE | Admit: 2013-05-03 | Discharge: 2013-05-03 | Disposition: A | Discharge status: Home / Self Care | Payer: BC Managed Care – PPO | Source: Ambulatory Visit | Attending: Orthopedic Surgery | Admitting: Orthopedic Surgery

## 2013-05-03 NOTE — Progress Notes (Signed)
Occupational Therapy Treatment Patient Details  Name: Ashley Armstrong MRN: [id number] Date of Birth: [birth year]  Today's Date: 05/03/2013 Time: 3086-5784 OT Time Calculation (min): 44 min MFR 936-950 19' Therex [id number] 25'  Visit#: 2 of 12  Re-eval: 05/24/13    Authorization:    Authorization Time Period:    Authorization Visit#:   of    Subjective Symptoms/Limitations Symptoms: S: I was in the hospital last week for a TIA. That's why I wasn't here last week.  Pain Assessment Currently in Pain?: No/denies  Precautions/Restrictions  Precautions Precautions: Fall  Exercise/Treatments Supine Protraction: PROM;10 reps;AAROM;12 reps Horizontal ABduction: PROM;10 reps;AAROM;12 reps External Rotation: PROM;10 reps;AAROM;12 reps Internal Rotation: PROM;10 reps;AAROM;12 reps Flexion: PROM;10 reps;AAROM;12 reps ABduction: PROM;10 reps;AAROM;12 reps Therapy Ball Flexion: 10 reps ABduction: 10 reps    Manual Therapy Manual Therapy: Myofascial release Myofascial Release: MFR and manual stretching to left elbow, upper arm, shoulder, and trapezius region to decrease fascial restrictions and increase joint mobility in a pain free zone.   Occupational Therapy Assessment and Plan OT Assessment and Plan Clinical Impression Statement: A: Patient performed AAROM and therapy ball exercises without difficulty. Patient stated that she was as [facility] last week for a TIA. Patient complaining of speech difficulties and balance deficits and they are improving every day. Patient is off work until the end of July and states that her elbow and shoulder feel so much better from not being at work.  OT Plan: P: Perform  AAROM seated, thumb tacks, wall wash   Goals Short Term Goals Time to Complete Short Term Goals: 3 weeks Short Term Goal 1: Patient will be educated on HEP.  Short Term Goal 1 Progress: Progressing toward goal Short Term Goal 2: Patient will increase left shoulder PROM to WNL  for increased ability to wash her back. Short Term Goal 2 Progress: Progressing toward goal Short Term Goal 3: Patient will decrease pain in her left shoulder to 3/10 or better when performing work related activities. Short Term Goal 3 Progress: Progressing toward goal Short Term Goal 4: Patient will decrease fascial restrictions to trace in her left shoulder region for greater mobility needed to fasten her bra strap. Short Term Goal 4 Progress: Progressing toward goal Short Term Goal 5: Patient will increase grip strength by  Short Term Goal 5 Progress: Progressing toward goal Long Term Goals Time to Complete Long Term Goals: 6 weeks Long Term Goal 1: Patient will return to highest level of independence with all daily, work, and leisure activities. Long Term Goal 1 Progress: Progressing toward goal Long Term Goal 2 Progress: Progressing toward goal Long Term Goal 3 Progress: Progressing toward goal Long Term Goal 4 Progress: Progressing toward goal Long Term Goal 5 Progress: Progressing toward goal  Problem List Patient Active Problem List   Diagnosis Date Noted  . TIA (transient ischemic attack) 04/28/2013  . Chronic kidney disease 04/28/2013  . Pain in joint, shoulder region 04/26/2013  . Muscle weakness (generalized) 04/26/2013  . Tennis elbow syndrome 04/15/2013  . Rotator cuff syndrome of left shoulder 04/15/2013  . Unspecified vitamin D deficiency 03/20/2013  . Onychomycosis of toenail 03/20/2013  . Left arm pain 03/19/2013  . HEADACHE 11/30/2008  . DIABETES MELLITUS, WITH RENAL COMPLICATIONS 10/24/2008  . FATIGUE 10/24/2008  . Gastroparesis 06/16/2008  . Diabetes mellitus, insulin dependent (IDDM), uncontrolled 04/13/2008  . Other and unspecified hyperlipidemia 04/13/2008  . Generalized anxiety disorder 04/13/2008    End of Session Activity Tolerance: Patient tolerated treatment well General  Behavior During Therapy: [facility] for tasks assessed/performed Cognition: [facility] for  tasks performed   Limmie Patricia, OTR/L,CBIS   05/03/2013, 12:00 PM

## 2013-05-05 ENCOUNTER — Ambulatory Visit (HOSPITAL_COMMUNITY)
Admission: RE | Admit: 2013-05-05 | Discharge: 2013-05-05 | Disposition: A | Discharge status: Home / Self Care | Payer: BC Managed Care – PPO | Source: Ambulatory Visit | Attending: Orthopedic Surgery | Admitting: Orthopedic Surgery

## 2013-05-05 NOTE — Progress Notes (Signed)
  Occupational Therapy Treatment Patient Details  Name: Ashley Armstrong MRN: [id number] Date of Birth: 1966/06/05  Today's Date: 05/05/2013 Time: 4098-1191 OT Time Calculation (min): 50 min MFR 925-941 16' Therex [phone] 34'  Visit#: 3 of 12  Re-eval: 05/24/13    Authorization:    Authorization Time Period:    Authorization Visit#:   of    Subjective Symptoms/Limitations Symptoms: S: Mu shoulder and elbow are a little sore today.  Pain Assessment Currently in Pain?: Yes Pain Score:   7 Pain Location: Shoulder Pain Orientation: Right Pain Type: Acute pain Pain Frequency: Intermittent Multiple Pain Sites: Yes  Precautions/Restrictions  Precautions Precautions: Fall  Exercise/Treatments Supine Protraction: PROM;10 reps;Strengthening;12 reps;Weights Protraction Weight (lbs): 1 Horizontal ABduction: PROM;10 reps;Strengthening;Weights;12 reps Horizontal ABduction Weight (lbs): 1 External Rotation: PROM;10 reps;Strengthening;Weights;12 reps External Rotation Weight (lbs): 1 Internal Rotation: PROM;10 reps;Strengthening;Weights;12 reps Internal Rotation Weight (lbs): 1 Flexion: PROM;10 reps;Strengthening;Weights;12 reps Shoulder Flexion Weight (lbs): 1 ABduction: PROM;10 reps;Strengthening;Weights;12 reps Shoulder ABduction Weight (lbs): 1 Seated Protraction: Strengthening;Weights;12 reps Protraction Weight (lbs): 1 Horizontal ABduction: Strengthening;Weights;12 reps Horizontal ABduction Weight (lbs): 1 External Rotation: Strengthening;Weights;12 reps External Rotation Weight (lbs): 1 Internal Rotation: Strengthening;Weights;12 reps Internal Rotation Weight (lbs): 1 Flexion: Strengthening;Weights;12 reps Flexion Weight (lbs): 1 Abduction: Strengthening;Weights;12 reps ABduction Weight (lbs): 1 ROM / Strengthening / Isometric Strengthening Thumb Tacks: 1' Prot/Ret//Elev/Dep: 1'       Manual Therapy Manual Therapy: Myofascial release Myofascial Release: MFR  and manual stretching to left elbow, upper arm, shoulder, and trapezius region to decrease fascial restrictions and increase joint mobility in a pain free zone.  Occupational Therapy Assessment and Plan OT Assessment and Plan Clinical Impression Statement: A: Added 1# weight supine and seated this date. tolerated well . OT Plan: P: Add wall wash and UBE bike.   Goals Short Term Goals Time to Complete Short Term Goals: 3 weeks Short Term Goal 1: Patient will be educated on HEP.  Short Term Goal 2: Patient will increase left shoulder PROM to WNL for increased ability to wash her back. Short Term Goal 3: Patient will decrease pain in her left shoulder to 3/10 or better when performing work related activities. Short Term Goal 4: Patient will decrease fascial restrictions to trace in her left shoulder region for greater mobility needed to fasten her bra strap. Short Term Goal 5: Patient will increase grip strength by  Long Term Goals Time to Complete Long Term Goals: 6 weeks Long Term Goal 1: Patient will return to highest level of independence with all daily, work, and leisure activities.  Problem List Patient Active Problem List   Diagnosis Date Noted  . TIA (transient ischemic attack) 04/28/2013  . Chronic kidney disease 04/28/2013  . Pain in joint, shoulder region 04/26/2013  . Muscle weakness (generalized) 04/26/2013  . Tennis elbow syndrome 04/15/2013  . Rotator cuff syndrome of left shoulder 04/15/2013  . Unspecified vitamin D deficiency 03/20/2013  . Onychomycosis of toenail 03/20/2013  . Left arm pain 03/19/2013  . HEADACHE 11/30/2008  . DIABETES MELLITUS, WITH RENAL COMPLICATIONS 10/24/2008  . FATIGUE 10/24/2008  . Gastroparesis 06/16/2008  . Diabetes mellitus, insulin dependent (IDDM), uncontrolled 04/13/2008  . Other and unspecified hyperlipidemia 04/13/2008  . Generalized anxiety disorder 04/13/2008    End of Session Activity Tolerance: Patient tolerated treatment  well General Behavior During Therapy: [facility] for tasks assessed/performed Cognition: [facility] for tasks performed   Limmie Patricia, OTR/L,CBIS   05/05/2013, 10:19 AM

## 2013-05-10 ENCOUNTER — Ambulatory Visit (HOSPITAL_COMMUNITY)
Admission: RE | Admit: 2013-05-10 | Discharge: 2013-05-10 | Disposition: A | Discharge status: Home / Self Care | Payer: BC Managed Care – PPO | Source: Ambulatory Visit | Attending: Orthopedic Surgery | Admitting: Orthopedic Surgery

## 2013-05-10 NOTE — Progress Notes (Signed)
Occupational Therapy Treatment Patient Details  Name: Ashley Armstrong MRN: [id number] Date of Birth: 02/14/1966  Today's Date: 05/10/2013 Time: 4098-1191 OT Time Calculation (min): 46 min MFR 930-945 15' Therex [phone] 31'  Visit#: 4 of 12  Re-eval: 05/24/13    Authorization:    Authorization Time Period:    Authorization Visit#:   of    Subjective Symptoms/Limitations Symptoms: S: I was a little sore from last session.  Pain Assessment Currently in Pain?: Yes Pain Score:   4 Pain Location: Shoulder Pain Orientation: Right Pain Type: Acute pain  Precautions/Restrictions  Precautions Precautions: Fall  Exercise/Treatments Supine Protraction: PROM;10 reps;Strengthening;15 reps Protraction Weight (lbs): 1 Horizontal ABduction: PROM;10 reps;Strengthening;15 reps Horizontal ABduction Weight (lbs): 1 External Rotation: PROM;10 reps;Strengthening;15 reps External Rotation Weight (lbs): 1 Internal Rotation: PROM;10 reps;Strengthening;15 reps Internal Rotation Weight (lbs): 1 Flexion: PROM;10 reps;Strengthening;15 reps Shoulder Flexion Weight (lbs): 1 ABduction: PROM;10 reps;Strengthening;15 reps Shoulder ABduction Weight (lbs): 1 Seated Protraction: Strengthening;15 reps Protraction Weight (lbs): 1 Horizontal ABduction: Strengthening;15 reps Horizontal ABduction Weight (lbs): 1 External Rotation: Strengthening;15 reps External Rotation Weight (lbs): 1 Internal Rotation: Strengthening;15 reps Internal Rotation Weight (lbs): 1 Flexion: Strengthening;15 reps Flexion Weight (lbs): 1 Abduction: Strengthening;15 reps ABduction Weight (lbs): 1 ROM / Strengthening / Isometric Strengthening UBE (Upper Arm Bike): 1.0 2' reverse 2' forward Wall Wash: 1' Thumb Tacks: 1' Ball on Wall: 1' flexion 1' Abduction green ball      Manual Therapy Manual Therapy: Myofascial release Myofascial Release: MFR and manual stretching to left elbow, upper arm, shoulder, and trapezius  region to decrease fascial restrictions and increase joint mobility in a pain free zone  Occupational Therapy Assessment and Plan OT Assessment and Plan Clinical Impression Statement: A: Added UBE bike, ball on the wall, and wall wash. Tolerated well.  OT Plan: P: Add theraband with HEP.   Goals Short Term Goals Time to Complete Short Term Goals: 3 weeks Short Term Goal 1: Patient will be educated on HEP.  Short Term Goal 1 Progress: Met Short Term Goal 2: Patient will increase left shoulder PROM to WNL for increased ability to wash her back. Short Term Goal 2 Progress: Progressing toward goal Short Term Goal 3: Patient will decrease pain in her left shoulder to 3/10 or better when performing work related activities. Short Term Goal 3 Progress: Progressing toward goal Short Term Goal 4: Patient will decrease fascial restrictions to trace in her left shoulder region for greater mobility needed to fasten her bra strap. Short Term Goal 4 Progress: Progressing toward goal Short Term Goal 5: Patient will increase grip strength by 5# and pinch strength by 2#. Short Term Goal 5 Progress: Progressing toward goal Long Term Goals Time to Complete Long Term Goals: 6 weeks Long Term Goal 1: Patient will return to highest level of independence with all daily, work, and leisure activities. Long Term Goal 1 Progress: Progressing toward goal Long Term Goal 2 Progress: Progressing toward goal Long Term Goal 3 Progress: Progressing toward goal Long Term Goal 4 Progress: Progressing toward goal Long Term Goal 5 Progress: Progressing toward goal  Problem List Patient Active Problem List   Diagnosis Date Noted  . TIA (transient ischemic attack) 04/28/2013  . Chronic kidney disease 04/28/2013  . Pain in joint, shoulder region 04/26/2013  . Muscle weakness (generalized) 04/26/2013  . Tennis elbow syndrome 04/15/2013  . Rotator cuff syndrome of left shoulder 04/15/2013  . Unspecified vitamin D  deficiency 03/20/2013  . Onychomycosis of toenail 03/20/2013  . Left  arm pain 03/19/2013  . HEADACHE 11/30/2008  . DIABETES MELLITUS, WITH RENAL COMPLICATIONS 10/24/2008  . FATIGUE 10/24/2008  . Gastroparesis 06/16/2008  . Diabetes mellitus, insulin dependent (IDDM), uncontrolled 04/13/2008  . Other and unspecified hyperlipidemia 04/13/2008  . Generalized anxiety disorder 04/13/2008    End of Session Activity Tolerance: Patient tolerated treatment well General Behavior During Therapy: [facility] for tasks assessed/performed Cognition: [facility] for tasks performed   Limmie Patricia, OTR/L,CBIS   05/10/2013, 10:17 AM

## 2013-05-11 ENCOUNTER — Ambulatory Visit (HOSPITAL_COMMUNITY)
Admission: RE | Admit: 2013-05-11 | Discharge: 2013-05-11 | Disposition: A | Discharge status: Home / Self Care | Payer: BC Managed Care – PPO | Source: Ambulatory Visit | Attending: Family Medicine | Admitting: Family Medicine

## 2013-05-11 DIAGNOSIS — Z794 Long term (current) use of insulin: Secondary | ICD-10-CM | POA: Insufficient documentation

## 2013-05-11 DIAGNOSIS — E119 Type 2 diabetes mellitus without complications: Secondary | ICD-10-CM | POA: Insufficient documentation

## 2013-05-11 DIAGNOSIS — M6281 Muscle weakness (generalized): Secondary | ICD-10-CM | POA: Insufficient documentation

## 2013-05-11 DIAGNOSIS — IMO0001 Reserved for inherently not codable concepts without codable children: Secondary | ICD-10-CM | POA: Insufficient documentation

## 2013-05-11 DIAGNOSIS — M25519 Pain in unspecified shoulder: Secondary | ICD-10-CM | POA: Insufficient documentation

## 2013-05-11 NOTE — Progress Notes (Signed)
Occupational Therapy Treatment Patient Details  Name: Ashley Armstrong MRN: [id number] Date of Birth: [birth year]  Today's Date: 05/11/2013 Time: 1191-4782 OT Time Calculation (min): 41 min MFR 934-946 12' Therex [phone] 29'  Visit#: 5 of 12  Re-eval: 05/24/13    Authorization:    Authorization Time Period:    Authorization Visit#:   of    Subjective Symptoms/Limitations Symptoms: S: I shoulder feels good today.  Pain Assessment Currently in Pain?: No/denies  Precautions/Restrictions  Precautions Precautions: Fall  Exercise/Treatments Supine Protraction: PROM;10 reps;Strengthening;15 reps Protraction Weight (lbs): 1 Horizontal ABduction: PROM;10 reps;Strengthening;15 reps Horizontal ABduction Weight (lbs): 1 External Rotation: PROM;10 reps;Strengthening;15 reps External Rotation Weight (lbs): 1 Internal Rotation: PROM;10 reps;Strengthening;15 reps Internal Rotation Weight (lbs): 1 Flexion: PROM;10 reps;Strengthening;15 reps Shoulder Flexion Weight (lbs): 1 ABduction: PROM;10 reps;Strengthening;15 reps Shoulder ABduction Weight (lbs): 1 Therapy Ball Right/Left: 5 reps ROM / Strengthening / Isometric Strengthening UBE (Upper Arm Bike): 1.0 3' reverse 3' forward Wall Wash: 1' "W" Arms: 12X 1# X to V Arms: 12X 1# Ball on Wall: 1' flexion 1' Abduction green ball      Manual Therapy Manual Therapy: Myofascial release Myofascial Release: MFR and manual stretching to left elbow, upper arm, shoulder, and trapezius region to decrease fascial restrictions and increase joint mobility in a pain free zone  Occupational Therapy Assessment and Plan OT Assessment and Plan Clinical Impression Statement: A: added X to V Arms and W Arms with 1#. Patient tolerated well. Increased time on UBE bike.  OT Plan: P: Add theraband with HEP.   Goals Short Term Goals Time to Complete Short Term Goals: 3 weeks Short Term Goal 1: Patient will be educated on HEP.  Short Term Goal 2:  Patient will increase left shoulder PROM to WNL for increased ability to wash her back. Short Term Goal 3: Patient will decrease pain in her left shoulder to 3/10 or better when performing work related activities. Short Term Goal 4: Patient will decrease fascial restrictions to trace in her left shoulder region for greater mobility needed to fasten her bra strap. Short Term Goal 5: Patient will increase grip strength by 5# and pinch strength by 2#. Long Term Goals Time to Complete Long Term Goals: 6 weeks Long Term Goal 1: Patient will return to highest level of independence with all daily, work, and leisure activities.  Problem List Patient Active Problem List   Diagnosis Date Noted  . TIA (transient ischemic attack) 04/28/2013  . Chronic kidney disease 04/28/2013  . Pain in joint, shoulder region 04/26/2013  . Muscle weakness (generalized) 04/26/2013  . Tennis elbow syndrome 04/15/2013  . Rotator cuff syndrome of left shoulder 04/15/2013  . Unspecified vitamin D deficiency 03/20/2013  . Onychomycosis of toenail 03/20/2013  . Left arm pain 03/19/2013  . HEADACHE 11/30/2008  . DIABETES MELLITUS, WITH RENAL COMPLICATIONS 10/24/2008  . FATIGUE 10/24/2008  . Gastroparesis 06/16/2008  . Diabetes mellitus, insulin dependent (IDDM), uncontrolled 04/13/2008  . Other and unspecified hyperlipidemia 04/13/2008  . Generalized anxiety disorder 04/13/2008    End of Session Activity Tolerance: Patient tolerated treatment well General Behavior During Therapy: [facility] for tasks assessed/performed Cognition: [facility] for tasks performed   Limmie Patricia, OTR/L,CBIS   05/11/2013, 11:32 AM

## 2013-05-12 ENCOUNTER — Ambulatory Visit (HOSPITAL_COMMUNITY): Payer: BC Managed Care – PPO

## 2013-05-17 ENCOUNTER — Ambulatory Visit (HOSPITAL_COMMUNITY)
Admission: RE | Admit: 2013-05-17 | Discharge: 2013-05-17 | Disposition: A | Discharge status: Home / Self Care | Payer: BC Managed Care – PPO | Source: Ambulatory Visit | Attending: Family Medicine | Admitting: Family Medicine

## 2013-05-17 NOTE — Progress Notes (Signed)
Occupational Therapy Treatment Patient Details  Name: Ashley Armstrong MRN: [id number] Date of Birth: September 25, 1966  Today's Date: 05/17/2013 Time: 4098-1191 OT Time Calculation (min): 50 min MFR 935-947 12' Therex [phone] 38'  Visit#: 6 of 12  Re-eval: 05/24/13    Authorization:    Authorization Time Period:    Authorization Visit#:   of    Subjective Symptoms/Limitations Symptoms: S: My shoulder and forearm are a little sore today. I was moving boxes Saturday. Pain Assessment Currently in Pain?: Yes Pain Score: 5  Pain Location: Shoulder Pain Orientation: Right Pain Type: Acute pain  Precautions/Restrictions  Precautions Precautions: Fall  Exercise/Treatments Supine Protraction: PROM;10 reps;Strengthening;15 reps Protraction Weight (lbs): 1 Horizontal ABduction: PROM;10 reps;Strengthening;15 reps Horizontal ABduction Weight (lbs): 1 External Rotation: PROM;10 reps;Strengthening;15 reps External Rotation Weight (lbs): 1 Internal Rotation: PROM;10 reps;Strengthening;15 reps Internal Rotation Weight (lbs): 1 Flexion: PROM;10 reps;Strengthening;15 reps Shoulder Flexion Weight (lbs): 1 ABduction: PROM;10 reps;Strengthening;15 reps Shoulder ABduction Weight (lbs): 1 Seated Protraction: Strengthening;15 reps Protraction Weight (lbs): 1 Horizontal ABduction: Strengthening;15 reps Horizontal ABduction Weight (lbs): 1 External Rotation: Strengthening;15 reps External Rotation Weight (lbs): 1 Internal Rotation: Strengthening;15 reps Internal Rotation Weight (lbs): 1 Flexion: Strengthening;15 reps Flexion Weight (lbs): 1 Abduction: Strengthening;15 reps ABduction Weight (lbs): 1 Standing External Rotation: Theraband;12 reps Theraband Level (Shoulder External Rotation): Level 2 (Red) Internal Rotation: Theraband;12 reps Theraband Level (Shoulder Internal Rotation): Level 2 (Red) Extension: Theraband;12 reps Theraband Level (Shoulder Extension): Level 2 (Red) Row:  Theraband;12 reps Theraband Level (Shoulder Row): Level 2 (Red) Retraction: Theraband;12 reps Theraband Level (Shoulder Retraction): Level 2 (Red) ROM / Strengthening / Isometric Strengthening UBE (Upper Arm Bike): 2.0 3' reverse 3' forward       Manual Therapy Manual Therapy: Myofascial release Myofascial Release: MFR and manual stretching to left elbow, upper arm, shoulder, and trapezius region to decrease fascial restrictions and increase joint mobility in a pain free zonetaken   Occupational Therapy Assessment and Plan OT Assessment and Plan Clinical Impression Statement: A: Added theraband exercises and increased resistance with UBE bike. Tolerated well. OT Plan: P: Reassess.   Goals Short Term Goals Time to Complete Short Term Goals: 3 weeks Short Term Goal 1: Patient will be educated on HEP.  Short Term Goal 2: Patient will increase left shoulder PROM to WNL for increased ability to wash her back. Short Term Goal 2 Progress: Progressing toward goal Short Term Goal 3: Patient will decrease pain in her left shoulder to 3/10 or better when performing work related activities. Short Term Goal 3 Progress: Progressing toward goal Short Term Goal 4: Patient will decrease fascial restrictions to trace in her left shoulder region for greater mobility needed to fasten her bra strap. Short Term Goal 4 Progress: Progressing toward goal Short Term Goal 5: Patient will increase grip strength by 5# and pinch strength by 2#. Short Term Goal 5 Progress: Progressing toward goal Long Term Goals Time to Complete Long Term Goals: 6 weeks Long Term Goal 1: Patient will return to highest level of independence with all daily, work, and leisure activities. Long Term Goal 1 Progress: Progressing toward goal Long Term Goal 2 Progress: Progressing toward goal Long Term Goal 3 Progress: Progressing toward goal Long Term Goal 4 Progress: Progressing toward goal Long Term Goal 5 Progress: Progressing  toward goal  Problem List Patient Active Problem List   Diagnosis Date Noted  . TIA (transient ischemic attack) 04/28/2013  . Chronic kidney disease 04/28/2013  . Pain in joint, shoulder region 04/26/2013  .  Muscle weakness (generalized) 04/26/2013  . Tennis elbow syndrome 04/15/2013  . Rotator cuff syndrome of left shoulder 04/15/2013  . Unspecified vitamin D deficiency 03/20/2013  . Onychomycosis of toenail 03/20/2013  . Left arm pain 03/19/2013  . HEADACHE 11/30/2008  . DIABETES MELLITUS, WITH RENAL COMPLICATIONS 10/24/2008  . FATIGUE 10/24/2008  . Gastroparesis 06/16/2008  . Diabetes mellitus, insulin dependent (IDDM), uncontrolled 04/13/2008  . Other and unspecified hyperlipidemia 04/13/2008  . Generalized anxiety disorder 04/13/2008    End of Session Activity Tolerance: Patient tolerated treatment well General Behavior During Therapy: [facility] for tasks assessed/performed Cognition: [facility] for tasks performed   Limmie Patricia, OTR/L,CBIS   05/17/2013, 10:35 AM

## 2013-05-18 ENCOUNTER — Encounter: Payer: Self-pay | Admitting: Family Medicine

## 2013-05-18 ENCOUNTER — Ambulatory Visit (INDEPENDENT_AMBULATORY_CARE_PROVIDER_SITE_OTHER): Payer: BC Managed Care – PPO | Admitting: Family Medicine

## 2013-05-18 ENCOUNTER — Ambulatory Visit (HOSPITAL_COMMUNITY)
Admission: RE | Admit: 2013-05-18 | Discharge: 2013-05-18 | Disposition: A | Discharge status: Home / Self Care | Payer: BC Managed Care – PPO | Source: Ambulatory Visit | Attending: Family Medicine | Admitting: Family Medicine

## 2013-05-18 VITALS — BP 108/74 | HR 93 | Resp 16 | Ht 68.0 in | Wt 115.1 lb

## 2013-05-18 DIAGNOSIS — R634 Abnormal weight loss: Secondary | ICD-10-CM

## 2013-05-18 DIAGNOSIS — E1129 Type 2 diabetes mellitus with other diabetic kidney complication: Secondary | ICD-10-CM

## 2013-05-18 DIAGNOSIS — Z87891 Personal history of nicotine dependence: Secondary | ICD-10-CM

## 2013-05-18 DIAGNOSIS — E119 Type 2 diabetes mellitus without complications: Secondary | ICD-10-CM | POA: Insufficient documentation

## 2013-05-18 DIAGNOSIS — F17201 Nicotine dependence, unspecified, in remission: Secondary | ICD-10-CM

## 2013-05-18 DIAGNOSIS — F411 Generalized anxiety disorder: Secondary | ICD-10-CM

## 2013-05-18 DIAGNOSIS — M79602 Pain in left arm: Secondary | ICD-10-CM

## 2013-05-18 DIAGNOSIS — E1065 Type 1 diabetes mellitus with hyperglycemia: Secondary | ICD-10-CM

## 2013-05-18 DIAGNOSIS — G459 Transient cerebral ischemic attack, unspecified: Secondary | ICD-10-CM

## 2013-05-18 DIAGNOSIS — R059 Cough, unspecified: Secondary | ICD-10-CM | POA: Insufficient documentation

## 2013-05-18 DIAGNOSIS — Z1211 Encounter for screening for malignant neoplasm of colon: Secondary | ICD-10-CM

## 2013-05-18 DIAGNOSIS — R05 Cough: Secondary | ICD-10-CM | POA: Insufficient documentation

## 2013-05-18 DIAGNOSIS — M79609 Pain in unspecified limb: Secondary | ICD-10-CM

## 2013-05-18 LAB — POC HEMOCCULT BLD/STL (OFFICE/1-CARD/DIAGNOSTIC): Fecal Occult Blood, POC: NEGATIVE

## 2013-05-18 LAB — GLUCOSE, POCT (MANUAL RESULT ENTRY): POC Glucose: 254 mg/dl — AB (ref 70–99)

## 2013-05-18 NOTE — Patient Instructions (Addendum)
F/u in 3 month, call if you need me before, cancel sooner  A letter will, be prepared by the end of the week and you will be called to collect for assistanc e with disability   Rectal exam today if abn you will be referred to GI  Pls schedule mammogram , you will get info with number to call  CXR today

## 2013-05-18 NOTE — Progress Notes (Signed)
  Subjective:    Patient ID: Ashley Armstrong, female    DOB: 1966/08/11, 47 y.o.   MRN: [id number]  HPI States here today because of planning to move forward with disability. States unable to control blood sugars continually fluctuate, states tests 4 to 5 times per day, states numbers get as low as 52, highest has been up to 468. C/o uncontrolled shoulder pain on left with limitation in mobil;ity, also numbness and tingling in left hand.  Mental health is uncontrolled, has uncontrolled anxiety, cannot drive a car alone, struggling with depression and irritability, mouth gets filthy, no physical abuse , not suicidal, going to [facility] for 4 month  Painful feet, keep her awake at nigh, poor sleep, also c/o polyuria  Also has eye damage from her diabetes needs to get injections every 6 weeks Has appt with endo for August, due to ins changes Seeing eye specialist, psych, diabetes, ortho, Dr Gerilyn Pilgrim    Review of Systems See HPI Denies recent fever or chills. Denies sinus pressure, nasal congestion, ear pain or sore throat. Denies chest congestion, productive cough or wheezing. Denies chest pains, palpitations and leg swelling Denies abdominal pain, nausea, vomiting,diarrhea or constipation.   Denies dysuria, frequency, hesitancy or incontinence.   Denies skin break down or rash.        Objective:   Physical Exam  Patient alert and oriented and in no cardiopulmonary distress.Anxious  HEENT: No facial asymmetry, EOMI, no sinus tenderness,  oropharynx pink and moist.  Neck supple no adenopathy.  Chest: Clear to auscultation bilaterally.  CVS: S1, S2 no murmurs, no S3.  ABD: Soft non tender. Bowel sounds normal. Rectal: no mass, heme negative stool Ext: No edema  MS: Adequate ROM spine, hips and knees.Decreased ROM left shoulder  Skin: Intact, no ulcerations or rash noted.  Psych: Good eye contact, normal affect. Memory intact anxious and mildly  depressed appearing.  CNS:  CN 2-12 intact, power, tone and sensation normal throughout.       Assessment & Plan:

## 2013-05-19 ENCOUNTER — Ambulatory Visit (HOSPITAL_COMMUNITY)
Admission: RE | Admit: 2013-05-19 | Discharge: 2013-05-19 | Disposition: A | Discharge status: Home / Self Care | Payer: BC Managed Care – PPO | Source: Ambulatory Visit | Attending: Family Medicine | Admitting: Family Medicine

## 2013-05-19 NOTE — Evaluation (Signed)
Occupational Therapy Re-Evaluation  Patient Details  Name: Ashley Armstrong MRN: [id number] Date of Birth: March 10, 1966  Today's Date: 05/19/2013 Time: 4098-1191 OT Time Calculation (min): 45 min MFR 930-944 14' Reassess [phone] 31'  Visit#: 7 of 12  Re-eval: 05/24/13     Authorization:    Authorization Time Period:    Authorization Visit#:   of     Past Medical History:  Past Medical History  Diagnosis Date  . Nicotine addiction   . Anxiety   . IDDM (insulin dependent diabetes mellitus)   . Dyslipidemia   . Headache(784.0)   . TIA (transient ischemic attack)    Past Surgical History:  Past Surgical History  Procedure Laterality Date  . Laser left eye  Oct 01, 2008    4 treatment on left eye and 2 on the right     Subjective Symptoms/Limitations Symptoms: S: My shoulder feels great today. My neck is the only thing that hurts but that always does.  Special Tests: DASH 50 with ideal score of 0. Pain Assessment Currently in Pain?: Yes Pain Score: 4  Pain Location: Neck Pain Type: Acute pain  Precautions/Restrictions  Precautions Precautions: Fall   Assessment  Additional Assessments RUE Assessment RUE Assessment:  (assessed seated. ER/IR ADD) LUE AROM (degrees) Left Shoulder Flexion: 154 Degrees (on eval: 124) Left Shoulder ABduction: 146 Degrees (on eval: 128) Left Shoulder Internal Rotation: 90 Degrees (on eval: 90) Left Shoulder External Rotation: 41 Degrees (on eval: 25) LUE Strength Left Shoulder Flexion: 5/5 (on eval : 4-/5) Left Shoulder ABduction: 5/5 (on eval: 4-/5) Left Shoulder Internal Rotation: 5/5 (on eval: 4-/5) Left Shoulder External Rotation: 5/5 (on eval: 4-/5) Grip (lbs): 60 (on eval: 52) Lateral Pinch: 14 lbs (on eval: 12) 3 Point Pinch: 11 lbs (on eval: 11) Left Hand Strength - Pinch (lbs) Lateral Pinch: 14 lbs (on eval: 12) 3 Point Pinch: 11 lbs (on eval: 11)   Exercise/Treatments    Manual Therapy Manual Therapy: Myofascial  release Myofascial Release: MFR and manual stretching to left elbow, upper arm, shoulder, and trapezius region to decrease fascial restrictions and increase joint mobility in a pain free zonetaken  Occupational Therapy Assessment and Plan OT Assessment and Plan Clinical Impression Statement: A: See MD note for progress. Patient has met all short term and long term goals and is ready for discharge from therapy.  OT Plan: P: D/C from therapy.    Goals Short Term Goals Time to Complete Short Term Goals: 3 weeks Short Term Goal 1: Patient will be educated on HEP.  Short Term Goal 2: Patient will increase left shoulder PROM to WNL for increased ability to wash her back. Short Term Goal 2 Progress: Met Short Term Goal 3: Patient will decrease pain in her left shoulder to 3/10 or better when performing work related activities. Short Term Goal 3 Progress: Met Short Term Goal 4: Patient will decrease fascial restrictions to trace in her left shoulder region for greater mobility needed to fasten her bra strap. Short Term Goal 4 Progress: Met Short Term Goal 5: Patient will increase grip strength by 5# and pinch strength by 2#. Short Term Goal 5 Progress: Met Long Term Goals Time to Complete Long Term Goals: 6 weeks Long Term Goal 1: Patient will return to highest level of independence with all daily, work, and leisure activities. Long Term Goal 1 Progress: Met Long Term Goal 2 Progress: Met Long Term Goal 3 Progress: Met Long Term Goal 4 Progress: Met Long Term Goal  5 Progress: Met  Problem List Patient Active Problem List   Diagnosis Date Noted  . Loss of weight 05/18/2013  . TIA (transient ischemic attack) 04/28/2013  . Chronic kidney disease 04/28/2013  . Pain in joint, shoulder region 04/26/2013  . Muscle weakness (generalized) 04/26/2013  . Tennis elbow syndrome 04/15/2013  . Rotator cuff syndrome of left shoulder 04/15/2013  . Unspecified vitamin D deficiency 03/20/2013  .  Onychomycosis of toenail 03/20/2013  . Left arm pain 03/19/2013  . HEADACHE 11/30/2008  . DIABETES MELLITUS, WITH RENAL COMPLICATIONS 10/24/2008  . FATIGUE 10/24/2008  . Gastroparesis 06/16/2008  . Diabetes mellitus, insulin dependent (IDDM), uncontrolled 04/13/2008  . Other and unspecified hyperlipidemia 04/13/2008  . Generalized anxiety disorder 04/13/2008    End of Session Activity Tolerance: Patient tolerated treatment well General Behavior During Therapy: WFL for tasks assessed/performed Cognition: WFL for tasks performed OT Plan of Care OT Home Exercise Plan: Red theraband exercises OT Patient Instructions: handout - scanned Consulted and Agree with Plan of Care: Patient   Limmie Patricia, OTR/L,CBIS   05/19/2013, 10:16 AM  Physician Documentation Your signature is required to indicate approval of the treatment plan as stated above.  Please sign and either send electronically or make a copy of this report for your files and return this physician signed original.  Please mark one 1.__approve of plan  2. ___approve of plan with the following conditions.   ______________________________                                                          _____________________ Physician Signature                                                                                                             Date

## 2013-05-20 ENCOUNTER — Ambulatory Visit: Payer: BC Managed Care – PPO | Admitting: Family Medicine

## 2013-05-24 ENCOUNTER — Telehealth: Payer: Self-pay

## 2013-05-24 NOTE — Telephone Encounter (Signed)
Patient aware.

## 2013-05-24 NOTE — Telephone Encounter (Signed)
Advise against this due to faxct she recently had a stroke, continue the plavix and keep f/u with neurology, discuss further with neurologist

## 2013-05-28 ENCOUNTER — Ambulatory Visit (HOSPITAL_COMMUNITY): Payer: BC Managed Care – PPO

## 2013-06-14 ENCOUNTER — Telehealth: Payer: Self-pay | Admitting: Family Medicine

## 2013-06-14 DIAGNOSIS — N189 Chronic kidney disease, unspecified: Secondary | ICD-10-CM

## 2013-06-14 MED ORDER — LISINOPRIL 2.5 MG PO TABS: 2.5000 mg | ORAL_TABLET | Freq: Every day | ORAL | Status: DC

## 2013-06-14 NOTE — Telephone Encounter (Signed)
This was sent in when she was here for her visit electonically. Resent it again today also

## 2013-06-17 ENCOUNTER — Ambulatory Visit (INDEPENDENT_AMBULATORY_CARE_PROVIDER_SITE_OTHER): Payer: BC Managed Care – PPO

## 2013-06-17 ENCOUNTER — Other Ambulatory Visit: Payer: Self-pay | Admitting: *Deleted

## 2013-06-17 ENCOUNTER — Ambulatory Visit (INDEPENDENT_AMBULATORY_CARE_PROVIDER_SITE_OTHER): Payer: BC Managed Care – PPO | Admitting: Orthopedic Surgery

## 2013-06-17 ENCOUNTER — Encounter: Payer: Self-pay | Admitting: Orthopedic Surgery

## 2013-06-17 ENCOUNTER — Telehealth: Payer: Self-pay | Admitting: Orthopedic Surgery

## 2013-06-17 VITALS — BP 142/90 | Ht 68.0 in | Wt 115.0 lb

## 2013-06-17 DIAGNOSIS — M79609 Pain in unspecified limb: Secondary | ICD-10-CM

## 2013-06-17 DIAGNOSIS — G5602 Carpal tunnel syndrome, left upper limb: Secondary | ICD-10-CM

## 2013-06-17 DIAGNOSIS — M79642 Pain in left hand: Secondary | ICD-10-CM

## 2013-06-17 DIAGNOSIS — G5600 Carpal tunnel syndrome, unspecified upper limb: Secondary | ICD-10-CM

## 2013-06-17 DIAGNOSIS — M542 Cervicalgia: Secondary | ICD-10-CM

## 2013-06-17 MED ORDER — NAPROXEN 500 MG PO TABS: 500.0000 mg | ORAL_TABLET | Freq: Two times a day (BID) | ORAL | Status: DC

## 2013-06-17 NOTE — Telephone Encounter (Signed)
Ibuprofen 800 mg tid  Or naprosyn 500 bid

## 2013-06-17 NOTE — Progress Notes (Signed)
Patient ID: Ashley Armstrong, female   DOB: Oct 15, 1966, 47 y.o.   MRN: [id number]  Chief Complaint  Patient presents with  . Hand Pain    Left hand pain, no injury.    47 years old history of left shoulder pain with impingement syndrome presents with numbness and tingling of the left hand and pain at the base of the left thumb. Pain came on gradually associated with numbness and tingling of the entire hand. Complains of sharp throbbing intermittent pain which is rated 9/10  No known drug allergies previous laser eye surgery family history of arthritis cancer and diabetes  Recent history of possible TIA versus seizure scheduled for neurologic workup and nerve conduction conduction study on Monday  History of weight loss chills and fatigue blurred vision palpitations nausea diarrhea frequency numbness tingling gait disturbance dizziness nervousness anxiety and excessive thirst and urination  BP 142/90  Ht 5\' 8"  (1.727 m)  Wt 115 lb (52.164 kg)  BMI 17.49 kg/m2  LMP 09/29/2011  General appearance is normal, the patient is alert and oriented x3 with normal mood and affect.  The patient does not have any and maladies with her walking but is noncontributory to this problem  She has a normal-appearing hand except for some abrasions on the volar aspect near the wrist which she relates to her job she is numbness in the fingertips and tenderness over the base of the thumb is painful grinding there is well. Her range of motion is normal at the wrist and in the hand there is no instability there is no atrophy her radial pulses normal temperature is normal again sensory loss noted in the fingertips  She is a nonsmoker  Hand x-rays: Normal x-ray of the hand Differential diagnosis carpal tunnel syndrome versus [facility] arthritis versus synovitis  At this point no recommendations will be made until her carpal tunnel testing is complete. We can see her back after her nerve conduction study if it is  positive  Addendum the patient complains cervical spine pain for the last 2-3 years so she may be having some disc issues. She also complains of persistent pain in the left elbow which I diagnosed her tennis elbow. She agreed to possible injection and I placed her on some home exercises.  Injection note Lateral epicondyle injection. (left)  Consent.  Timeout to confirm site.  LEFT elbow was injected with Depo-Medrol 40 mg and lidocaine 1% 3 cc with sterile technique using alcohol and ethyl chloride prep.  There were no complications

## 2013-06-17 NOTE — Patient Instructions (Addendum)
Go ahead and see the neurologist and get I nerve conduction study he will refer you back to me if you have carpal tunnel syndrome Wear splint full time    Lateral Epicondylitis (Tennis Elbow) with Rehab Lateral epicondylitis involves inflammation and pain around the outer portion of the elbow. The pain is caused by inflammation of the tendons in the forearm that bring back (extend) the wrist. Lateral epicondylittis is also called tennis elbow, because it is very common in tennis players. However, it may occur in any individual who extends the wrist repetitively. If lateral epicondylitis is left untreated, it may become a chronic problem. SYMPTOMS   Pain, tenderness, and inflammation on the outer (lateral) side of the elbow.  Pain or weakness with gripping activities.  Pain that increases with wrist twisting motions (playing tennis, using a screwdriver, opening a door or a jar).  Pain with lifting objects, including a coffee cup. CAUSES  Lateral epicondylitis is caused by inflammation of the tendons that extend the wrist. Causes of injury may include:  Repetitive stress and strain on the muscles and tendons that extend the wrist.  Sudden change in activity level or intensity.  Incorrect grip in racquet sports.  Incorrect grip size of racquet (often too large).  Incorrect hitting position or technique (usually backhand, leading with the elbow).  Using a racket that is too heavy. RISK INCREASES WITH:  Sports or occupations that require repetitive and/or strenuous forearm and wrist movements (tennis, squash, racquetball, carpentry).  Poor wrist and forearm strength and flexibility.  Failure to warm up properly before activity.  Resuming activity before healing, rehabilitation, and conditioning are complete. PREVENTION   Warm up and stretch properly before activity.  Maintain physical fitness:  Strength, flexibility, and endurance.  Cardiovascular fitness.  Wear and use  properly fitted equipment.  Learn and use proper technique and have a coach correct improper technique.  Wear a tennis elbow (counterforce) brace. PROGNOSIS  The course of this condition depends on the degree of the injury. If treated properly, acute cases (symptoms lasting less than 4 weeks) are often resolved in 2 to 6 weeks. Chronic (longer lasting cases) often resolve in 3 to 6 months, but may require physical therapy. RELATED COMPLICATIONS   Frequently recurring symptoms, resulting in a chronic problem. Properly treating the problem the first time decreases frequency of recurrence.  Chronic inflammation, scarring tendon degeneration, and partial tendon tear, requiring surgery.  Delayed healing or resolution of symptoms. TREATMENT  Treatment first involves the use of ice and medicine, to reduce pain and inflammation. Strengthening and stretching exercises may help reduce discomfort, if performed regularly. These exercises may be performed at home, if the condition is an acute injury. Chronic cases may require a referral to a physical therapist for evaluation and treatment. Your caregiver may advise a corticosteroid injection, to help reduce inflammation. Rarely, surgery is needed. MEDICATION  If pain medicine is needed, nonsteroidal anti-inflammatory medicines (aspirin and ibuprofen), or other minor pain relievers (acetaminophen), are often advised.  Do not take pain medicine for 7 days before surgery.  Prescription pain relievers may be given, if your caregiver thinks they are needed. Use only as directed and only as much as you need.  Corticosteroid injections may be recommended. These injections should be reserved only for the most severe cases, because they can only be given a certain number of times. HEAT AND COLD  Cold treatment (icing) should be applied for 10 to 15 minutes every 2 to 3 hours for inflammation  and pain, and immediately after activity that aggravates your  symptoms. Use ice packs or an ice massage.  Heat treatment may be used before performing stretching and strengthening activities prescribed by your caregiver, physical therapist, or athletic trainer. Use a heat pack or a warm water soak. SEEK MEDICAL CARE IF: Symptoms get worse or do not improve in 2 weeks, despite treatment. EXERCISES  RANGE OF MOTION (ROM) AND STRETCHING EXERCISES - Epicondylitis, Lateral (Tennis Elbow) These exercises may help you when beginning to rehabilitate your injury. Your symptoms may go away with or without further involvement from your physician, physical therapist or athletic trainer. While completing these exercises, remember:   Restoring tissue flexibility helps normal motion to return to the joints. This allows healthier, less painful movement and activity.  An effective stretch should be held for at least 30 seconds.  A stretch should never be painful. You should only feel a gentle lengthening or release in the stretched tissue. RANGE OF MOTION  Wrist Flexion, Active-Assisted  Extend your right / left elbow with your fingers pointing down.*  Gently pull the back of your hand towards you, until you feel a gentle stretch on the top of your forearm.  Hold this position for __________ seconds. Repeat __________ times. Complete this exercise __________ times per day.  *If directed by your physician, physical therapist or athletic trainer, complete this stretch with your elbow bent, rather than extended. RANGE OF MOTION  Wrist Extension, Active-Assisted  Extend your right / left elbow and turn your palm upwards.*  Gently pull your palm and fingertips back, so your wrist extends and your fingers point more toward the ground.  You should feel a gentle stretch on the inside of your forearm.  Hold this position for __________ seconds. Repeat __________ times. Complete this exercise __________ times per day. *If directed by your physician, physical therapist or  athletic trainer, complete this stretch with your elbow bent, rather than extended. STRETCH - Wrist Flexion  Place the back of your right / left hand on a tabletop, leaving your elbow slightly bent. Your fingers should point away from your body.  Gently press the back of your hand down onto the table by straightening your elbow. You should feel a stretch on the top of your forearm.  Hold this position for __________ seconds. Repeat __________ times. Complete this stretch __________ times per day.  STRETCH  Wrist Extension   Place your right / left fingertips on a tabletop, leaving your elbow slightly bent. Your fingers should point backwards.  Gently press your fingers and palm down onto the table by straightening your elbow. You should feel a stretch on the inside of your forearm.  Hold this position for __________ seconds. Repeat __________ times. Complete this stretch __________ times per day.  STRENGTHENING EXERCISES - Epicondylitis, Lateral (Tennis Elbow) These exercises may help you when beginning to rehabilitate your injury. They may resolve your symptoms with or without further involvement from your physician, physical therapist or athletic trainer. While completing these exercises, remember:   Muscles can gain both the endurance and the strength needed for everyday activities through controlled exercises.  Complete these exercises as instructed by your physician, physical therapist or athletic trainer. Increase the resistance and repetitions only as guided.  You may experience muscle soreness or fatigue, but the pain or discomfort you are trying to eliminate should never worsen during these exercises. If this pain does get worse, stop and make sure you are following the directions exactly. If  the pain is still present after adjustments, discontinue the exercise until you can discuss the trouble with your caregiver. STRENGTH Wrist Flexors  Sit with your right / left forearm palm-up  and fully supported on a table or countertop. Your elbow should be resting below the height of your shoulder. Allow your wrist to extend over the edge of the surface.  Loosely holding a __________ weight, or a piece of rubber exercise band or tubing, slowly curl your hand up toward your forearm.  Hold this position for __________ seconds. Slowly lower the wrist back to the starting position in a controlled manner. Repeat __________ times. Complete this exercise __________ times per day.  STRENGTH  Wrist Extensors  Sit with your right / left forearm palm-down and fully supported on a table or countertop. Your elbow should be resting below the height of your shoulder. Allow your wrist to extend over the edge of the surface.  Loosely holding a __________ weight, or a piece of rubber exercise band or tubing, slowly curl your hand up toward your forearm.  Hold this position for __________ seconds. Slowly lower the wrist back to the starting position in a controlled manner. Repeat __________ times. Complete this exercise __________ times per day.  STRENGTH - Ulnar Deviators  Stand with a ____________________ weight in your right / left hand, or sit while holding a rubber exercise band or tubing, with your healthy arm supported on a table or countertop.  Move your wrist, so that your pinkie travels toward your forearm and your thumb moves away from your forearm.  Hold this position for __________ seconds and then slowly lower the wrist back to the starting position. Repeat __________ times. Complete this exercise __________ times per day STRENGTH - Radial Deviators  Stand with a ____________________ weight in your right / left hand, or sit while holding a rubber exercise band or tubing, with your injured arm supported on a table or countertop.  Raise your hand upward in front of you or pull up on the rubber tubing.  Hold this position for __________ seconds and then slowly lower the wrist back  to the starting position. Repeat __________ times. Complete this exercise __________ times per day. STRENGTH  Forearm Supinators   Sit with your right / left forearm supported on a table, keeping your elbow below shoulder height. Rest your hand over the edge, palm down.  Gently grip a hammer or a soup ladle.  Without moving your elbow, slowly turn your palm and hand upward to a "thumbs-up" position.  Hold this position for __________ seconds. Slowly return to the starting position. Repeat __________ times. Complete this exercise __________ times per day.  STRENGTH  Forearm Pronators   Sit with your right / left forearm supported on a table, keeping your elbow below shoulder height. Rest your hand over the edge, palm up.  Gently grip a hammer or a soup ladle.  Without moving your elbow, slowly turn your palm and hand upward to a "thumbs-up" position.  Hold this position for __________ seconds. Slowly return to the starting position. Repeat __________ times. Complete this exercise __________ times per day.  STRENGTH - Grip  Grasp a tennis ball, a dense sponge, or a large, rolled sock in your hand.  Squeeze as hard as you can, without increasing any pain.  Hold this position for __________ seconds. Release your grip slowly. Repeat __________ times. Complete this exercise __________ times per day.  STRENGTH - Elbow Extensors, Isometric  Stand or sit upright,  on a firm surface. Place your right / left arm so that your palm faces your stomach, and it is at the height of your waist.  Place your opposite hand on the underside of your forearm. Gently push up as your right / left arm resists. Push as hard as you can with both arms, without causing any pain or movement at your right / left elbow. Hold this stationary position for __________ seconds. Gradually release the tension in both arms. Allow your muscles to relax completely before repeating. Document Released: 10/28/2005 Document  Revised: 01/20/2012 Document Reviewed: 02/09/2009 [facility] Patient Information 2014 [place], [place].

## 2013-06-17 NOTE — Telephone Encounter (Signed)
Sent prescription for Naprosyn 500 to pharmacy per Dr. Romeo Apple. Patient informed.

## 2013-06-17 NOTE — Telephone Encounter (Signed)
Ashley Armstrong asked if you will call in a prescription for pain medicine for the pain she is having from her elbow down to her hand.  Said the Tramadol you prescribed in June does not help. Uses [facility].  Her # [phone]

## 2013-06-18 ENCOUNTER — Other Ambulatory Visit: Payer: Self-pay

## 2013-06-18 ENCOUNTER — Telehealth: Payer: Self-pay | Admitting: Family Medicine

## 2013-06-18 MED ORDER — INSULIN ASPART 100 UNIT/ML ~~LOC~~ SOLN: 10.0000 [IU] | Freq: Three times a day (TID) | SUBCUTANEOUS | Status: DC

## 2013-06-18 NOTE — Telephone Encounter (Signed)
Patient referred to Endo.   Will confirm with patient that she sees Dr. Fransico Him.   Left msg for patient to return call to clarify.

## 2013-06-20 NOTE — Telephone Encounter (Signed)
Patient is not to see endo until the end of this month.  Requested meds refilled.

## 2013-06-22 ENCOUNTER — Telehealth: Payer: Self-pay

## 2013-06-22 ENCOUNTER — Other Ambulatory Visit: Payer: Self-pay

## 2013-06-22 MED ORDER — INSULIN ASPART 100 UNIT/ML FLEXPEN: PEN_INJECTOR | SUBCUTANEOUS | Status: DC

## 2013-06-22 MED ORDER — INSULIN ASPART 100 UNIT/ML ~~LOC~~ SOLN: 10.0000 [IU] | Freq: Three times a day (TID) | SUBCUTANEOUS | Status: DC

## 2013-06-22 NOTE — Telephone Encounter (Signed)
Refill sent in for novolog flexpen. Patient aware to only take 10 units three times daily as have been doing previously

## 2013-06-23 ENCOUNTER — Encounter: Payer: Self-pay | Admitting: Family Medicine

## 2013-06-23 ENCOUNTER — Ambulatory Visit (INDEPENDENT_AMBULATORY_CARE_PROVIDER_SITE_OTHER): Payer: BC Managed Care – PPO | Admitting: Family Medicine

## 2013-06-23 VITALS — BP 120/70 | HR 81 | Resp 16 | Wt 117.8 lb

## 2013-06-23 DIAGNOSIS — E1065 Type 1 diabetes mellitus with hyperglycemia: Secondary | ICD-10-CM

## 2013-06-23 DIAGNOSIS — F411 Generalized anxiety disorder: Secondary | ICD-10-CM

## 2013-06-23 DIAGNOSIS — I10 Essential (primary) hypertension: Secondary | ICD-10-CM

## 2013-06-23 DIAGNOSIS — E1129 Type 2 diabetes mellitus with other diabetic kidney complication: Secondary | ICD-10-CM

## 2013-06-23 DIAGNOSIS — E1121 Type 2 diabetes mellitus with diabetic nephropathy: Secondary | ICD-10-CM | POA: Insufficient documentation

## 2013-06-23 DIAGNOSIS — N058 Unspecified nephritic syndrome with other morphologic changes: Secondary | ICD-10-CM

## 2013-06-23 NOTE — Assessment & Plan Note (Signed)
Uncontrolled, already being treated by psych

## 2013-06-23 NOTE — Assessment & Plan Note (Signed)
Pt to be evalauted by nephrology

## 2013-06-23 NOTE — Progress Notes (Signed)
  Subjective:    Patient ID: Ashley Armstrong, female    DOB: 20-Mar-1966, 47 y.o.   MRN: [id number]  HPI Pt in with the concern that she has been noting elevated blood pressure readings at home and has been told of this at other Md offices in the past. She subsequently resumed the ;lisinopril she had been on in the past and is here to have BP checked. Still awaiting endo eval with report of uncontrolled sugar. Has seen ortho , who has refered her to neurology for nerve conduction testing Continues to experience anxiety and depression over poor health but regularly sees mental health    Review of Systems See HPI Denies recent fever or chills. Denies sinus pressure, nasal congestion, ear pain or sore throat. Denies chest congestion, productive cough or wheezing. Denies chest pains, palpitations and leg swelling Denies abdominal pain, nausea, vomiting,diarrhea or constipation.   Denies dysuria, frequency, hesitancy or incontinence.  Denies headaches, seizures,  Denies skin break down or rash.        Objective:   Physical Exam  Patient alert and oriented and in no cardiopulmonary distress.  HEENT: No facial asymmetry, EOMI, no sinus tenderness,  oropharynx pink and moist.  Neck supple no adenopathy.  Chest: Clear to auscultation bilaterally.  CVS: S1, S2 no murmurs, no S3.  ABD: Soft non tender. Bowel sounds normal.  Ext: No edema  MS: Adequate ROM spine, decreased ROM left  Shoulder,adeqaute in  hips and knees.  Skin: Intact, no ulcerations or rash noted.  Psych: Good eye contact, normal affect. Memory intact  anxious not  depressed appearing.  CNS: CN 2-12 intact, power, tone and sensation normal throughout.       Assessment & Plan:

## 2013-06-23 NOTE — Patient Instructions (Addendum)
F/u as before.  Blood pressure is good today, take lisinopril one daily  You are referred to kidney specialist and also for an ultrasound of your kidney

## 2013-06-23 NOTE — Assessment & Plan Note (Signed)
Uncontrolled with multiple complications, the importance of re establishing with endo, following treatment and diet plan is stressed

## 2013-06-23 NOTE — Assessment & Plan Note (Signed)
Follow up evaluation by neurology is scheduled

## 2013-06-23 NOTE — Assessment & Plan Note (Signed)
Orthopedic evaluation upcoming

## 2013-06-25 ENCOUNTER — Telehealth: Payer: Self-pay | Admitting: Family Medicine

## 2013-06-25 NOTE — Telephone Encounter (Signed)
Script written, pls fax 

## 2013-06-25 NOTE — Telephone Encounter (Signed)
Faxed

## 2013-06-25 NOTE — Telephone Encounter (Signed)
Noted  

## 2013-06-25 NOTE — Telephone Encounter (Signed)
Can script be given please.

## 2013-06-28 ENCOUNTER — Ambulatory Visit: Payer: BC Managed Care – PPO | Admitting: Family Medicine

## 2013-06-29 ENCOUNTER — Other Ambulatory Visit (HOSPITAL_COMMUNITY): Payer: BC Managed Care – PPO

## 2013-06-30 ENCOUNTER — Ambulatory Visit (HOSPITAL_COMMUNITY)
Admission: RE | Admit: 2013-06-30 | Discharge: 2013-06-30 | Disposition: A | Discharge status: Home / Self Care | Payer: BC Managed Care – PPO | Source: Ambulatory Visit | Attending: Family Medicine | Admitting: Family Medicine

## 2013-06-30 DIAGNOSIS — N058 Unspecified nephritic syndrome with other morphologic changes: Secondary | ICD-10-CM | POA: Insufficient documentation

## 2013-06-30 DIAGNOSIS — E1121 Type 2 diabetes mellitus with diabetic nephropathy: Secondary | ICD-10-CM

## 2013-06-30 DIAGNOSIS — E1129 Type 2 diabetes mellitus with other diabetic kidney complication: Secondary | ICD-10-CM | POA: Insufficient documentation

## 2013-07-04 DIAGNOSIS — I10 Essential (primary) hypertension: Secondary | ICD-10-CM | POA: Insufficient documentation

## 2013-07-04 NOTE — Assessment & Plan Note (Signed)
Still has not seen endo, has upcoming appt reports blood sugars to fluctuate alot

## 2013-07-04 NOTE — Assessment & Plan Note (Signed)
conntinues to be treated by pschiatry

## 2013-07-04 NOTE — Assessment & Plan Note (Signed)
Good blood pressure control on low dose lisinopril, pt to continue same

## 2013-07-05 ENCOUNTER — Other Ambulatory Visit: Payer: Self-pay | Admitting: Family Medicine

## 2013-07-05 ENCOUNTER — Telehealth: Payer: Self-pay | Admitting: Family Medicine

## 2013-07-05 DIAGNOSIS — D229 Melanocytic nevi, unspecified: Secondary | ICD-10-CM

## 2013-07-05 NOTE — Telephone Encounter (Signed)
PLS refer to derm [place] [facility] per pt request

## 2013-07-07 ENCOUNTER — Encounter: Payer: Self-pay | Admitting: Family Medicine

## 2013-07-08 ENCOUNTER — Other Ambulatory Visit (HOSPITAL_COMMUNITY): Payer: Self-pay | Admitting: Preventative Medicine

## 2013-07-08 ENCOUNTER — Encounter: Payer: Self-pay | Admitting: Orthopedic Surgery

## 2013-07-08 ENCOUNTER — Ambulatory Visit (INDEPENDENT_AMBULATORY_CARE_PROVIDER_SITE_OTHER): Payer: BC Managed Care – PPO | Admitting: Orthopedic Surgery

## 2013-07-08 VITALS — BP 140/83 | Ht 68.0 in | Wt 115.0 lb

## 2013-07-08 DIAGNOSIS — G629 Polyneuropathy, unspecified: Secondary | ICD-10-CM | POA: Insufficient documentation

## 2013-07-08 DIAGNOSIS — M67919 Unspecified disorder of synovium and tendon, unspecified shoulder: Secondary | ICD-10-CM

## 2013-07-08 DIAGNOSIS — R109 Unspecified abdominal pain: Secondary | ICD-10-CM

## 2013-07-08 DIAGNOSIS — G609 Hereditary and idiopathic neuropathy, unspecified: Secondary | ICD-10-CM

## 2013-07-08 DIAGNOSIS — E1121 Type 2 diabetes mellitus with diabetic nephropathy: Secondary | ICD-10-CM

## 2013-07-08 DIAGNOSIS — G459 Transient cerebral ischemic attack, unspecified: Secondary | ICD-10-CM

## 2013-07-08 DIAGNOSIS — N058 Unspecified nephritic syndrome with other morphologic changes: Secondary | ICD-10-CM

## 2013-07-08 DIAGNOSIS — G5600 Carpal tunnel syndrome, unspecified upper limb: Secondary | ICD-10-CM | POA: Insufficient documentation

## 2013-07-08 DIAGNOSIS — R140 Abdominal distension (gaseous): Secondary | ICD-10-CM

## 2013-07-08 DIAGNOSIS — E1129 Type 2 diabetes mellitus with other diabetic kidney complication: Secondary | ICD-10-CM

## 2013-07-08 DIAGNOSIS — M75102 Unspecified rotator cuff tear or rupture of left shoulder, not specified as traumatic: Secondary | ICD-10-CM

## 2013-07-08 DIAGNOSIS — N189 Chronic kidney disease, unspecified: Secondary | ICD-10-CM

## 2013-07-08 NOTE — Progress Notes (Signed)
Patient ID: Ashley Armstrong, female   DOB: [birth year], 47 y.o.   MRN: [id number]  Chief Complaint  Patient presents with  . Results    Review results from Nerve Conduction Study from Dr. Gerilyn Pilgrim    Nerve conduction study shows that the patient has polyneuropathy probably from diabetes but this is not confirmed. However she is now complaining of multiple abdominal aches and pains with bleeding noted from the urine she will see her primary care physician for that and her surgeries postponed at least for the next 4 weeks

## 2013-07-08 NOTE — Patient Instructions (Addendum)
Encounter Diagnoses  Name Primary?  . TIA (transient ischemic attack) Yes  . Rotator cuff syndrome of left shoulder   . Diabetic nephropathy   . Chronic kidney disease   . CTS (carpal tunnel syndrome), unspecified laterality   . Polyneuropathy   diabetes

## 2013-07-09 ENCOUNTER — Other Ambulatory Visit (HOSPITAL_COMMUNITY): Payer: Self-pay | Admitting: Preventative Medicine

## 2013-07-09 ENCOUNTER — Ambulatory Visit (HOSPITAL_COMMUNITY)
Admission: RE | Admit: 2013-07-09 | Discharge: 2013-07-09 | Disposition: A | Discharge status: Home / Self Care | Payer: BC Managed Care – PPO | Source: Ambulatory Visit | Attending: Preventative Medicine | Admitting: Preventative Medicine

## 2013-07-09 DIAGNOSIS — R140 Abdominal distension (gaseous): Secondary | ICD-10-CM

## 2013-07-09 DIAGNOSIS — R142 Eructation: Secondary | ICD-10-CM | POA: Insufficient documentation

## 2013-07-09 DIAGNOSIS — R109 Unspecified abdominal pain: Secondary | ICD-10-CM

## 2013-07-09 DIAGNOSIS — K824 Cholesterolosis of gallbladder: Secondary | ICD-10-CM | POA: Insufficient documentation

## 2013-07-09 DIAGNOSIS — R141 Gas pain: Secondary | ICD-10-CM | POA: Insufficient documentation

## 2013-07-13 ENCOUNTER — Other Ambulatory Visit: Payer: Self-pay | Admitting: Neurology

## 2013-07-13 DIAGNOSIS — M5412 Radiculopathy, cervical region: Secondary | ICD-10-CM

## 2013-07-14 ENCOUNTER — Telehealth: Payer: Self-pay | Admitting: Orthopedic Surgery

## 2013-07-14 NOTE — Telephone Encounter (Signed)
Routing to Dr Harrison 

## 2013-07-14 NOTE — Telephone Encounter (Signed)
Patient called, requests medication for pain - "states hurting all over"; states at her recent office visit (07/08/13) Dr. Romeo Apple ordered her anti-inflammatory medication which she said is not helping with the pain.  She states that her primary care physician, Dr. Lodema Hong, is out of office all this week.  She also relayed that she went to Urgent Care on Friday, 07/09/13 for stomach pain -- said Dr. Romeo Apple is aware of stomach problems, and that she is to call him back when this problem is resolved so that her carpal tunnel surgery can be scheduled.) Please advise regarding a prescription for pain -- her pharmacy is [facility].  Her [phone]

## 2013-07-15 NOTE — Telephone Encounter (Signed)
Sorry we can't help with that

## 2013-07-19 NOTE — Telephone Encounter (Signed)
Patient informed. 

## 2013-07-20 ENCOUNTER — Ambulatory Visit: Payer: Self-pay | Admitting: Family Medicine

## 2013-07-23 ENCOUNTER — Encounter (HOSPITAL_COMMUNITY): Payer: Self-pay

## 2013-07-23 ENCOUNTER — Ambulatory Visit (HOSPITAL_COMMUNITY)
Admission: RE | Admit: 2013-07-23 | Discharge: 2013-07-23 | Disposition: A | Discharge status: Home / Self Care | Payer: BC Managed Care – PPO | Source: Ambulatory Visit | Attending: Neurology | Admitting: Neurology

## 2013-07-23 DIAGNOSIS — M5020 Other cervical disc displacement, unspecified cervical region: Secondary | ICD-10-CM | POA: Insufficient documentation

## 2013-07-23 DIAGNOSIS — M79609 Pain in unspecified limb: Secondary | ICD-10-CM | POA: Insufficient documentation

## 2013-07-23 DIAGNOSIS — M542 Cervicalgia: Secondary | ICD-10-CM | POA: Insufficient documentation

## 2013-07-23 DIAGNOSIS — M5412 Radiculopathy, cervical region: Secondary | ICD-10-CM

## 2013-07-29 ENCOUNTER — Ambulatory Visit (INDEPENDENT_AMBULATORY_CARE_PROVIDER_SITE_OTHER): Payer: BC Managed Care – PPO | Admitting: Family Medicine

## 2013-07-29 ENCOUNTER — Encounter: Payer: Self-pay | Admitting: Family Medicine

## 2013-07-29 ENCOUNTER — Other Ambulatory Visit (HOSPITAL_COMMUNITY)
Admission: RE | Admit: 2013-07-29 | Discharge: 2013-07-29 | Disposition: A | Discharge status: Home / Self Care | Payer: BC Managed Care – PPO | Source: Ambulatory Visit | Attending: Family Medicine | Admitting: Family Medicine

## 2013-07-29 VITALS — BP 136/84 | HR 93 | Resp 18 | Ht 68.0 in | Wt 122.0 lb

## 2013-07-29 DIAGNOSIS — E559 Vitamin D deficiency, unspecified: Secondary | ICD-10-CM

## 2013-07-29 DIAGNOSIS — Z23 Encounter for immunization: Secondary | ICD-10-CM

## 2013-07-29 DIAGNOSIS — R1013 Epigastric pain: Secondary | ICD-10-CM

## 2013-07-29 DIAGNOSIS — I10 Essential (primary) hypertension: Secondary | ICD-10-CM

## 2013-07-29 DIAGNOSIS — Z01419 Encounter for gynecological examination (general) (routine) without abnormal findings: Secondary | ICD-10-CM | POA: Insufficient documentation

## 2013-07-29 DIAGNOSIS — F411 Generalized anxiety disorder: Secondary | ICD-10-CM

## 2013-07-29 DIAGNOSIS — Z0000 Encounter for general adult medical examination without abnormal findings: Secondary | ICD-10-CM

## 2013-07-29 DIAGNOSIS — Z1239 Encounter for other screening for malignant neoplasm of breast: Secondary | ICD-10-CM

## 2013-07-29 DIAGNOSIS — Z1211 Encounter for screening for malignant neoplasm of colon: Secondary | ICD-10-CM

## 2013-07-29 DIAGNOSIS — M546 Pain in thoracic spine: Secondary | ICD-10-CM

## 2013-07-29 DIAGNOSIS — Z1151 Encounter for screening for human papillomavirus (HPV): Secondary | ICD-10-CM | POA: Insufficient documentation

## 2013-07-29 DIAGNOSIS — E1065 Type 1 diabetes mellitus with hyperglycemia: Secondary | ICD-10-CM

## 2013-07-29 DIAGNOSIS — N950 Postmenopausal bleeding: Secondary | ICD-10-CM | POA: Insufficient documentation

## 2013-07-29 LAB — POC HEMOCCULT BLD/STL (OFFICE/1-CARD/DIAGNOSTIC)

## 2013-07-29 NOTE — Patient Instructions (Addendum)
F/u in 2 month  You are referred for pelvic ultrasound and to a female gynecologist to evaluate bleeding  You  Will be referred to a female stomach specialist to evluate  abdominal pain  You will get mammogram appt today, pls get this  Done  Xray will be ordered of your mid back   Flu vaccine today

## 2013-07-29 NOTE — Progress Notes (Signed)
  Subjective:    Patient ID: Ashley Armstrong, female    DOB: July 07, 1966, 47 y.o.   MRN: [id number]  HPI Pt with recent   Episode of menstrual flow after having no cycle for 5 years C/o burning epigastric pain and poor appetite, occasional dysphagia Chronic anxiety still uncontrolled  C/o mid back pain, no aggravating factor, non radiating In for annual exam and mammogram still past due   Review of Systems See HPI Denies recent fever or chills. Denies sinus pressure, nasal congestion, ear pain or sore throat. Denies chest congestion, productive cough or wheezing. Denies chest pains, palpitations and leg swelling  Denies dysuria, frequency, hesitancy or incontinence.  Denies headaches, seizures, numbness, or tingling. Denies uncontrolled depression,has increased  anxiety denies  insomnia. Denies skin break down or rash.        Objective:   Physical Exam  Pleasant well nourished female, alert and oriented x 3, in no cardio-pulmonary distress. Afebrile. HEENT No facial trauma or asymetry. Sinuses non tender.  EOMI, PERTL External ears normal, tympanic membranes clear. Oropharynx moist, no exudate, fair dentition. Neck: supple, no adenopathy,JVD or thyromegaly.No bruits.  Chest: Clear to ascultation bilaterally.No crackles or wheezes. Non tender to palpation  Breast: No asymetry,no masses. No nipple discharge or inversion. No axillary or supraclavicular adenopathy  Cardiovascular system; Heart sounds normal,  S1 and  S2 ,no S3.  No murmur, or thrill. Apical beat not displaced Peripheral pulses normal.  Abdomen: Soft, mild epigastric tenderness, no organomegaly or masses. No bruits. Bowel sounds normal. No guarding, tenderness or rebound.  Rectal:  No mass. Guaiac negative stool.  GU: External genitalia normal. No lesions. Vaginal canal dry.Scant   discharge. Uterus normal size, no adnexal masses, no cervical motion or adnexal tenderness.  Musculoskeletal  exam: Full ROM of spine, hips , shoulders and knees. No deformity ,swelling or crepitus noted. No muscle wasting or atrophy.   Neurologic: Cranial nerves 2 to 12 intact. Power, tone , and reflexes normal throughout.Decreased sensation in feet No disturbance in gait. No tremor.  Skin: Intact, no ulceration, erythema , scaling or rash noted. Pigmentation normal throughout  Psych; Normal mood and affect. Judgement and concentration normal. Anxious       Assessment & Plan:

## 2013-07-31 DIAGNOSIS — R1013 Epigastric pain: Secondary | ICD-10-CM | POA: Insufficient documentation

## 2013-07-31 DIAGNOSIS — Z0000 Encounter for general adult medical examination without abnormal findings: Secondary | ICD-10-CM | POA: Insufficient documentation

## 2013-07-31 DIAGNOSIS — M546 Pain in thoracic spine: Secondary | ICD-10-CM | POA: Insufficient documentation

## 2013-07-31 NOTE — Assessment & Plan Note (Signed)
Currently being treated by endo and reports improvement

## 2013-07-31 NOTE — Assessment & Plan Note (Signed)
Still uncontrolled, being followed by psych

## 2013-07-31 NOTE — Assessment & Plan Note (Signed)
Uncontrolled mid back pain will refer for xray

## 2013-07-31 NOTE — Assessment & Plan Note (Signed)
C/o central abdominal pain, extending to pelvic area x 1 month, GI to eval

## 2013-07-31 NOTE — Assessment & Plan Note (Signed)
Recent menstrual flow, 5 years after previous episode, needs pelvic US and gyne eval

## 2013-07-31 NOTE — Assessment & Plan Note (Signed)
Annual exam as documented. Pt need mammogram Encouraged to continue to be diligent with blood suagr management. Mutiple concerns abnd referrals made, mostr significant is post menopausal bleed

## 2013-07-31 NOTE — Assessment & Plan Note (Signed)
Continue weekly supplement 

## 2013-07-31 NOTE — Assessment & Plan Note (Signed)
Controlled, no change in medication  

## 2013-08-02 ENCOUNTER — Telehealth: Payer: Self-pay | Admitting: Family Medicine

## 2013-08-02 ENCOUNTER — Encounter: Payer: Self-pay | Admitting: Gastroenterology

## 2013-08-02 NOTE — Telephone Encounter (Signed)
No results as of yet

## 2013-08-04 NOTE — Telephone Encounter (Signed)
Patient aware of results.

## 2013-08-04 NOTE — Telephone Encounter (Signed)
Pap results faxed to [facility] in [place]

## 2013-08-05 ENCOUNTER — Telehealth: Payer: Self-pay | Admitting: Family Medicine

## 2013-08-05 ENCOUNTER — Ambulatory Visit (HOSPITAL_COMMUNITY): Payer: BC Managed Care – PPO

## 2013-08-05 ENCOUNTER — Encounter: Payer: Self-pay | Admitting: Orthopedic Surgery

## 2013-08-05 ENCOUNTER — Other Ambulatory Visit (HOSPITAL_COMMUNITY): Payer: BC Managed Care – PPO

## 2013-08-05 ENCOUNTER — Ambulatory Visit (INDEPENDENT_AMBULATORY_CARE_PROVIDER_SITE_OTHER): Payer: BC Managed Care – PPO | Admitting: Orthopedic Surgery

## 2013-08-05 VITALS — BP 128/85 | Ht 68.0 in | Wt 115.0 lb

## 2013-08-05 DIAGNOSIS — G629 Polyneuropathy, unspecified: Secondary | ICD-10-CM

## 2013-08-05 DIAGNOSIS — G609 Hereditary and idiopathic neuropathy, unspecified: Secondary | ICD-10-CM

## 2013-08-05 DIAGNOSIS — G5600 Carpal tunnel syndrome, unspecified upper limb: Secondary | ICD-10-CM

## 2013-08-05 NOTE — Telephone Encounter (Signed)
Brandi faxed over papers to DR. Galloways office [phone]

## 2013-08-05 NOTE — Patient Instructions (Addendum)
Call the office to reschedule AFTER stomach doctors have finished treatment for bleeding, etc.  We can not do surgery until then

## 2013-08-05 NOTE — Progress Notes (Signed)
Patient ID: Ashley Armstrong, female   DOB: 05-25-1966, 47 y.o.   MRN: [id number]  Chief Complaint  Patient presents with  . Follow-up    4 week follow up pre op left carpal tunnel    HISTORY: This patient returns to [place] again she has a polyneuropathy diagnosed by nerve conduction study she has neck pain and has a cervical disc at C5 and 6 she has some radicular symptoms from this. She is also being worked up for GI bleed and therefore cannot have surgery at this time she is on omeprazole and gabapentin to help with her gastric upset stomach and her paresthesias  Recommend she come back after her workups are complete. I did tell her she has a 50-50 chance of surgery improving her carpal tunnel symptoms because of the neuropathy noted on nerve conduction

## 2013-08-06 ENCOUNTER — Ambulatory Visit (HOSPITAL_COMMUNITY): Admission: RE | Admit: 2013-08-06 | Payer: BC Managed Care – PPO | Source: Ambulatory Visit

## 2013-08-06 ENCOUNTER — Other Ambulatory Visit: Payer: Self-pay | Admitting: Family Medicine

## 2013-08-06 ENCOUNTER — Ambulatory Visit (HOSPITAL_COMMUNITY)
Admission: RE | Admit: 2013-08-06 | Discharge: 2013-08-06 | Disposition: A | Discharge status: Home / Self Care | Payer: BC Managed Care – PPO | Source: Ambulatory Visit | Attending: Family Medicine | Admitting: Family Medicine

## 2013-08-06 DIAGNOSIS — N950 Postmenopausal bleeding: Secondary | ICD-10-CM | POA: Insufficient documentation

## 2013-08-06 DIAGNOSIS — Z1239 Encounter for other screening for malignant neoplasm of breast: Secondary | ICD-10-CM

## 2013-08-06 DIAGNOSIS — M546 Pain in thoracic spine: Secondary | ICD-10-CM | POA: Insufficient documentation

## 2013-08-06 DIAGNOSIS — Z1231 Encounter for screening mammogram for malignant neoplasm of breast: Secondary | ICD-10-CM | POA: Insufficient documentation

## 2013-08-07 ENCOUNTER — Encounter (HOSPITAL_COMMUNITY): Payer: Self-pay | Admitting: *Deleted

## 2013-08-07 ENCOUNTER — Emergency Department (HOSPITAL_COMMUNITY)
Admission: EM | Admit: 2013-08-07 | Discharge: 2013-08-07 | Disposition: A | Discharge status: Home / Self Care | Payer: BC Managed Care – PPO | Attending: Emergency Medicine | Admitting: Emergency Medicine

## 2013-08-07 DIAGNOSIS — Z7902 Long term (current) use of antithrombotics/antiplatelets: Secondary | ICD-10-CM | POA: Insufficient documentation

## 2013-08-07 DIAGNOSIS — Z794 Long term (current) use of insulin: Secondary | ICD-10-CM | POA: Insufficient documentation

## 2013-08-07 DIAGNOSIS — F411 Generalized anxiety disorder: Secondary | ICD-10-CM | POA: Insufficient documentation

## 2013-08-07 DIAGNOSIS — E785 Hyperlipidemia, unspecified: Secondary | ICD-10-CM | POA: Insufficient documentation

## 2013-08-07 DIAGNOSIS — J029 Acute pharyngitis, unspecified: Secondary | ICD-10-CM | POA: Insufficient documentation

## 2013-08-07 DIAGNOSIS — J40 Bronchitis, not specified as acute or chronic: Secondary | ICD-10-CM | POA: Insufficient documentation

## 2013-08-07 DIAGNOSIS — Z87891 Personal history of nicotine dependence: Secondary | ICD-10-CM | POA: Insufficient documentation

## 2013-08-07 DIAGNOSIS — E119 Type 2 diabetes mellitus without complications: Secondary | ICD-10-CM | POA: Insufficient documentation

## 2013-08-07 DIAGNOSIS — J069 Acute upper respiratory infection, unspecified: Secondary | ICD-10-CM | POA: Insufficient documentation

## 2013-08-07 DIAGNOSIS — Z8673 Personal history of transient ischemic attack (TIA), and cerebral infarction without residual deficits: Secondary | ICD-10-CM | POA: Insufficient documentation

## 2013-08-07 DIAGNOSIS — Z79899 Other long term (current) drug therapy: Secondary | ICD-10-CM | POA: Insufficient documentation

## 2013-08-07 MED ORDER — CEPHALEXIN 500 MG PO CAPS
500.0000 mg | ORAL_CAPSULE | Freq: Once | ORAL | Status: AC
  Administered 2013-08-07: 500 mg via ORAL
  Filled 2013-08-07: qty 1

## 2013-08-07 MED ORDER — CEPHALEXIN 500 MG PO CAPS: 500.0000 mg | ORAL_CAPSULE | Freq: Four times a day (QID) | ORAL | Status: DC

## 2013-08-07 MED ORDER — PROMETHAZINE-CODEINE 6.25-10 MG/5ML PO SYRP: 5.0000 mL | ORAL_SOLUTION | ORAL | Status: DC | PRN

## 2013-08-07 MED ORDER — PSEUDOEPHEDRINE HCL 60 MG PO TABS
60.0000 mg | ORAL_TABLET | Freq: Once | ORAL | Status: AC
Start: 2013-08-07 — End: 2013-08-07
  Administered 2013-08-07: 60 mg via ORAL
  Filled 2013-08-07: qty 1

## 2013-08-07 MED ORDER — PREDNISONE 50 MG PO TABS
60.0000 mg | ORAL_TABLET | Freq: Once | ORAL | Status: AC
  Administered 2013-08-07: 60 mg via ORAL
  Filled 2013-08-07 (×2): qty 1

## 2013-08-07 MED ORDER — PREDNISONE 10 MG PO TABS: ORAL_TABLET | ORAL | Status: DC

## 2013-08-07 NOTE — ED Provider Notes (Signed)
Medical screening examination/treatment/procedure(s) were performed by non-physician practitioner and as supervising physician I was immediately available for consultation/collaboration.   Manley Fason, MD 08/07/13 2321 

## 2013-08-07 NOTE — ED Provider Notes (Signed)
CSN: [id number]     Arrival date & time 08/07/13  2045 History   First MD Initiated Contact with Patient 08/07/13 2109     Chief Complaint  Patient presents with  . Cough  . Sore Throat   (Consider location/radiation/quality/duration/timing/severity/associated sxs/prior Treatment) Patient is a 47 y.o. female presenting with cough. The history is provided by the patient.  Cough Cough characteristics:  Non-productive and barking Severity:  Moderate Onset quality:  Gradual Duration:  4 days Timing:  Intermittent Progression:  Worsening Chronicity:  New Smoker: no   Context: sick contacts and weather changes   Relieved by:  Nothing Worsened by:  Nothing tried Associated symptoms: chills, ear fullness, headaches and sore throat   Associated symptoms: no chest pain, no eye discharge, no shortness of breath and no wheezing   Associated symptoms comment:  Hoarseness    Past Medical History  Diagnosis Date  . Nicotine addiction   . Anxiety   . IDDM (insulin dependent diabetes mellitus)   . Dyslipidemia   . Headache(784.0)   . TIA (transient ischemic attack)    Past Surgical History  Procedure Laterality Date  . Laser left eye  Oct 01, 2008    4 treatment on left eye and 2 on the right    Family History  Problem Relation Age of Onset  . Hypertension Mother   . Diabetes Mother   . Hyperlipidemia Mother   . Rosacea Mother   . Hypertension Father    History  Substance Use Topics  . Smoking status: Former Games developer  . Smokeless tobacco: Not on file  . Alcohol Use: No   OB History   Grav Para Term Preterm Abortions TAB SAB Ect Mult Living                 Review of Systems  Constitutional: Positive for chills. Negative for activity change.       All ROS Neg except as noted in HPI  HENT: Positive for sore throat. Negative for nosebleeds and neck pain.   Eyes: Negative for photophobia and discharge.  Respiratory: Positive for cough. Negative for shortness of breath and  wheezing.   Cardiovascular: Negative for chest pain and palpitations.  Gastrointestinal: Negative for abdominal pain and blood in stool.  Genitourinary: Negative for dysuria, frequency and hematuria.  Musculoskeletal: Negative for back pain and arthralgias.  Skin: Negative.   Neurological: Positive for headaches. Negative for dizziness, seizures and speech difficulty.  Psychiatric/Behavioral: Negative for hallucinations and confusion.    Allergies  Review of patient's allergies indicates no known allergies.  Home Medications   Current Outpatient Rx  Name  Route  Sig  Dispense  Refill  . busPIRone (BUSPAR) 5 MG tablet   Oral   Take 5 mg by mouth 3 (three) times daily.         . clopidogrel (PLAVIX) 75 MG tablet   Oral   Take 1 tablet (75 mg total) by mouth daily with breakfast.   30 tablet   2   . diazepam (VALIUM) 10 MG tablet   Oral   Take 10 mg by mouth 2 (two) times daily as needed for anxiety.         . DULoxetine (CYMBALTA) 60 MG capsule   Oral   Take 60 mg by mouth daily.         Marland Kitchen gabapentin (NEURONTIN) 300 MG capsule   Oral   Take 300 mg by mouth 3 (three) times daily.         Marland Kitchen  insulin aspart (NOVOLOG FLEXPEN) 100 UNIT/ML SOPN FlexPen      Inject 20 units three times daily before meals   15 mL   2     Coverage before breakfast and lunch only   . insulin glargine (LANTUS) 100 UNIT/ML injection   Subcutaneous   Inject 0.25 mLs (25 Units total) into the skin daily. Inject 25 units daily   10 mL   0   . lisinopril (ZESTRIL) 2.5 MG tablet   Oral   Take 1 tablet (2.5 mg total) by mouth daily.   30 tablet   5   . meclizine (ANTIVERT) 25 MG tablet   Oral   Take 1 tablet (25 mg total) by mouth every 6 (six) hours as needed for dizziness.   20 tablet   0   . naproxen (NAPROSYN) 500 MG tablet   Oral   Take 1 tablet (500 mg total) by mouth 2 (two) times daily with a meal.   60 tablet   2   . omega-3 acid ethyl esters (LOVAZA) 1 G capsule    Oral   Take 1 g by mouth daily.         Marland Kitchen omeprazole (PRILOSEC) 40 MG capsule   Oral   Take 40 mg by mouth daily.         . pravastatin (PRAVACHOL) 80 MG tablet   Oral   Take 1 tablet (80 mg total) by mouth every evening.   30 tablet   11   . traMADol-acetaminophen (ULTRACET) 37.5-325 MG per tablet   Oral   Take 1 tablet by mouth every 4 (four) hours as needed for pain.   90 tablet   5   . ergocalciferol (VITAMIN D2) 50000 UNITS capsule   Oral   Take 1 capsule (50,000 Units total) by mouth once a week. One capsule once weekly   12 capsule   1   . Ranibizumab (LUCENTIS) 0.3 MG/0.05ML SOLN   Intraocular   Inject 0.3 mg into the eye every 6 (six) weeks.          BP 143/79  Pulse 88  Temp(Src) 98.5 F (36.9 C) (Oral)  Ht 5\' 8"  (1.727 m)  Wt 122 lb (55.339 kg)  BMI 18.55 kg/m2  SpO2 100%  LMP 07/26/2013 Physical Exam  Nursing note and vitals reviewed. Constitutional: She is oriented to person, place, and time. She appears well-developed and well-nourished.  Non-toxic appearance.  HENT:  Head: Normocephalic.  Right Ear: Tympanic membrane and external ear normal.  Left Ear: Tympanic membrane and external ear normal.  Mod nasal congestion. Mod increased redness of the posterior pharynx. Uvula enlarged. The air way is patent. Mild to mod hoarseness present.  Eyes: EOM and lids are normal. Pupils are equal, round, and reactive to light.  Neck: Normal range of motion. Neck supple. Carotid bruit is not present.  Cardiovascular: Normal rate, regular rhythm, normal heart sounds, intact distal pulses and normal pulses.   Pulmonary/Chest: Breath sounds normal. No respiratory distress.  Course breath sounds present.  Abdominal: Soft. Bowel sounds are normal. There is no tenderness. There is no guarding.  Musculoskeletal: Normal range of motion.  Lymphadenopathy:       Head (right side): No submandibular adenopathy present.       Head (left side): No submandibular  adenopathy present.    She has no cervical adenopathy.  Neurological: She is alert and oriented to person, place, and time. She has normal strength. No cranial nerve deficit or  sensory deficit.  Skin: Skin is warm and dry.  Psychiatric: She has a normal mood and affect. Her speech is normal.    ED Course  Procedures (including critical care time) Labs Review Labs Reviewed  GLUCOSE, CAPILLARY - Abnormal; Notable for the following:    Glucose-Capillary 190 (*)    All other components within normal limits  RAPID STREP SCREEN  CULTURE, GROUP A STREP   Imaging Review Dg Thoracic Spine 2 View  08/06/2013   CLINICAL DATA:  Thoracic back pain. No known injury.  EXAM: THORACIC SPINE - 2 VIEW  COMPARISON:  None.  FINDINGS: There is no evidence of thoracic spine fracture. Alignment is normal. No other significant bone abnormalities are identified.  IMPRESSION: Negative.   Electronically Signed   By: Myles Rosenthal   On: 08/06/2013 12:37   US Transvaginal Non-ob  08/06/2013   CLINICAL DATA:  Postmenopausal bleeding.  EXAM: TRANSABDOMINAL AND TRANSVAGINAL ULTRASOUND OF PELVIS  TECHNIQUE: Both transabdominal and transvaginal ultrasound examinations of the pelvis were performed. Transabdominal technique was performed for global imaging of the pelvis including uterus, ovaries, adnexal regions, and pelvic cul-de-sac. It was necessary to proceed with endovaginal exam following the transabdominal exam to visualize the endometrium and ovaries.  COMPARISON:  12/26/2003  FINDINGS: Uterus  Measurements: 5.5 x 3.2 x 3.5 cm. Retroverted. No fibroids or other mass visualized.  Endometrium  Thickness: 3 mm.  No focal abnormality visualized.  Right ovary  Measurements: 2.6 x 1.0 by 1.3 cm. Normal appearance/no adnexal mass.  Left ovary  Measurements: 1.6 x 1.1 x 2.3 cm. Normal appearance/no adnexal mass.  Other findings  No free fluid.  IMPRESSION: No evidence of fibroids. Endometrial thickness measures 3 mm. In the setting  of post-menopausal bleeding, this is consistent with a benign etiology such as endometrial atrophy. If bleeding remains unresponsive to hormonal or medical therapy, sonohysterogram should be considered for focal lesion work-up. (Ref: Radiological Reasoning: Algorithmic Workup of Abnormal Vaginal Bleeding with Endovaginal Sonography and Sonohysterography. AJR 2008; 161:W96-04).  Normal appearance of ovaries. No adnexal mass identified.   Electronically Signed   By: Myles Rosenthal   On: 08/06/2013 11:31   US Pelvis Complete  08/06/2013   CLINICAL DATA:  Postmenopausal bleeding.  EXAM: TRANSABDOMINAL AND TRANSVAGINAL ULTRASOUND OF PELVIS  TECHNIQUE: Both transabdominal and transvaginal ultrasound examinations of the pelvis were performed. Transabdominal technique was performed for global imaging of the pelvis including uterus, ovaries, adnexal regions, and pelvic cul-de-sac. It was necessary to proceed with endovaginal exam following the transabdominal exam to visualize the endometrium and ovaries.  COMPARISON:  12/26/2003  FINDINGS: Uterus  Measurements: 5.5 x 3.2 x 3.5 cm. Retroverted. No fibroids or other mass visualized.  Endometrium  Thickness: 3 mm.  No focal abnormality visualized.  Right ovary  Measurements: 2.6 x 1.0 by 1.3 cm. Normal appearance/no adnexal mass.  Left ovary  Measurements: 1.6 x 1.1 x 2.3 cm. Normal appearance/no adnexal mass.  Other findings  No free fluid.  IMPRESSION: No evidence of fibroids. Endometrial thickness measures 3 mm. In the setting of post-menopausal bleeding, this is consistent with a benign etiology such as endometrial atrophy. If bleeding remains unresponsive to hormonal or medical therapy, sonohysterogram should be considered for focal lesion work-up. (Ref: Radiological Reasoning: Algorithmic Workup of Abnormal Vaginal Bleeding with Endovaginal Sonography and Sonohysterography. AJR 2008; 540:J81-19).  Normal appearance of ovaries. No adnexal mass identified.   Electronically  Signed   By: Myles Rosenthal   On: 08/06/2013 11:31  MDM  No diagnosis found. *I have reviewed nursing notes, vital signs, and all appropriate lab and imaging results for this patient.**  Exam is consistent with bronchitis and pharyngitis. Glucose 190.  Will use keflex, sudafed, and short course of steroid. Pt advised to increase water and juices and use voice rest.  Kathie Dike, PA-C 08/07/13 2200  Kathie Dike, PA-C 08/07/13 2216

## 2013-08-07 NOTE — ED Notes (Signed)
Pt reporting non productive cough for several days, headache and chills as well.

## 2013-08-10 ENCOUNTER — Other Ambulatory Visit: Payer: Self-pay | Admitting: Family Medicine

## 2013-08-10 ENCOUNTER — Encounter: Payer: Self-pay | Admitting: Family Medicine

## 2013-08-10 LAB — CULTURE, GROUP A STREP

## 2013-08-11 ENCOUNTER — Other Ambulatory Visit: Payer: Self-pay | Admitting: Family Medicine

## 2013-08-11 DIAGNOSIS — E1143 Type 2 diabetes mellitus with diabetic autonomic (poly)neuropathy: Secondary | ICD-10-CM

## 2013-08-11 DIAGNOSIS — R928 Other abnormal and inconclusive findings on diagnostic imaging of breast: Secondary | ICD-10-CM

## 2013-08-11 HISTORY — DX: Type 2 diabetes mellitus with diabetic autonomic (poly)neuropathy: E11.43

## 2013-08-18 ENCOUNTER — Other Ambulatory Visit: Payer: Self-pay | Admitting: Family Medicine

## 2013-08-18 ENCOUNTER — Ambulatory Visit (HOSPITAL_COMMUNITY)
Admission: RE | Admit: 2013-08-18 | Discharge: 2013-08-18 | Disposition: A | Discharge status: Home / Self Care | Payer: BC Managed Care – PPO | Source: Ambulatory Visit | Attending: Family Medicine | Admitting: Family Medicine

## 2013-08-18 ENCOUNTER — Telehealth: Payer: Self-pay | Admitting: Family Medicine

## 2013-08-18 DIAGNOSIS — R921 Mammographic calcification found on diagnostic imaging of breast: Secondary | ICD-10-CM

## 2013-08-18 DIAGNOSIS — R928 Other abnormal and inconclusive findings on diagnostic imaging of breast: Secondary | ICD-10-CM | POA: Insufficient documentation

## 2013-08-18 NOTE — Telephone Encounter (Signed)
Noted and faxed.  

## 2013-08-20 ENCOUNTER — Ambulatory Visit
Admission: RE | Admit: 2013-08-20 | Discharge: 2013-08-20 | Disposition: A | Discharge status: Home / Self Care | Payer: BC Managed Care – PPO | Source: Ambulatory Visit | Attending: Family Medicine | Admitting: Family Medicine

## 2013-08-20 DIAGNOSIS — R921 Mammographic calcification found on diagnostic imaging of breast: Secondary | ICD-10-CM

## 2013-08-24 ENCOUNTER — Ambulatory Visit: Payer: BC Managed Care – PPO | Admitting: Family Medicine

## 2013-08-24 ENCOUNTER — Encounter: Payer: Self-pay | Admitting: Gastroenterology

## 2013-08-24 ENCOUNTER — Encounter (HOSPITAL_COMMUNITY): Payer: Self-pay | Admitting: Pharmacy Technician

## 2013-08-24 ENCOUNTER — Ambulatory Visit (INDEPENDENT_AMBULATORY_CARE_PROVIDER_SITE_OTHER): Payer: BC Managed Care – PPO | Admitting: Gastroenterology

## 2013-08-24 VITALS — BP 120/80 | HR 80 | Temp 97.9°F | Ht 67.5 in | Wt 124.6 lb

## 2013-08-24 DIAGNOSIS — K3189 Other diseases of stomach and duodenum: Secondary | ICD-10-CM

## 2013-08-24 DIAGNOSIS — R1013 Epigastric pain: Secondary | ICD-10-CM | POA: Insufficient documentation

## 2013-08-24 HISTORY — PX: BREAST BIOPSY: SHX20

## 2013-08-24 NOTE — Progress Notes (Signed)
cc'd to pcp 

## 2013-08-24 NOTE — Assessment & Plan Note (Signed)
47 year old female referred for epigastric pain, severe reflux for several months; no melena, rectal bleeding, or esophageal dysphagia. She does however, note early satiety and a feeling of food "sitting" in her epigastric region. Again, no true esophageal dysphagia. With her history of Type 1 Diabetes, unable to exclude underlying diabetic gastroparesis. Abdominal pain raises concern for gastritis, PUD, less likely biliary etiology. Recommend EGD with Dr. Darrick Penna in the near future. The risks and benefits were explained in detail; she states understanding. Recommend HIDA scan if negative EGD. May need to consider GES to document gastroparesis (patient has been told she has this, but I do not see a formal GES).

## 2013-08-24 NOTE — Progress Notes (Signed)
Referring Provider: Kerri Perches, MD Primary Care Physician:  Syliva Overman, MD Primary GI: Dr. Darrick Penna   Chief Complaint  Patient presents with  . Abdominal Pain  . Heartburn    HPI:   Ashley Armstrong presents today at the request of Dr. Lodema Hong secondary to dyspepsia. About June, intermittent epigastric pain started. Early satiety. Weight up and down, Diabetes type 1. Feels bloated at times. Poor appetite. Intermittent indigestion, severe nocturnal reflux at times. States neck glands stay swollen. Denies esophageal dysphagia. Intermittent nausea, no vomiting. No aggravating or relieving factors. No melena, no hematochezia. No changes in bowel habits. Omeprazole 40 mg daily has helped some but not completely. No NSAIDs, aspirin powders. Feels like food sometimes doesn't go down, like it is stuck in the epigastric region. Chronic. Supposed to have surgery with Dr. Romeo Apple but states she needs clearance from GI first. States she has been told she has gastroparesis.    Past Medical History  Diagnosis Date  . Nicotine addiction   . Anxiety   . IDDM (insulin dependent diabetes mellitus)   . Dyslipidemia   . Headache(784.0)   . TIA (transient ischemic attack)     Dr. Gerilyn Pilgrim    Past Surgical History  Procedure Laterality Date  . Laser surgery bilateral eye      Current Outpatient Prescriptions  Medication Sig Dispense Refill  . busPIRone (BUSPAR) 5 MG tablet Take 5 mg by mouth 3 (three) times daily.      . clopidogrel (PLAVIX) 75 MG tablet Take 1 tablet (75 mg total) by mouth daily with breakfast.  30 tablet  2  . diazepam (VALIUM) 10 MG tablet Take 10 mg by mouth 2 (two) times daily as needed for anxiety.      . DULoxetine (CYMBALTA) 60 MG capsule Take 60 mg by mouth daily.      . ergocalciferol (VITAMIN D2) 50000 UNITS capsule Take 1 capsule (50,000 Units total) by mouth once a week. One capsule once weekly  12 capsule  1  . gabapentin (NEURONTIN) 300 MG capsule Take  300 mg by mouth 3 (three) times daily.      . insulin aspart (NOVOLOG FLEXPEN) 100 UNIT/ML SOPN FlexPen Inject 20 units three times daily before meals  15 mL  2  . insulin glargine (LANTUS) 100 UNIT/ML injection Inject 0.25 mLs (25 Units total) into the skin daily. Inject 25 units daily  10 mL  0  . lisinopril (ZESTRIL) 2.5 MG tablet Take 1 tablet (2.5 mg total) by mouth daily.  30 tablet  5  . meclizine (ANTIVERT) 25 MG tablet Take 1 tablet (25 mg total) by mouth every 6 (six) hours as needed for dizziness.  20 tablet  0  . omega-3 acid ethyl esters (LOVAZA) 1 G capsule Take 1 g by mouth daily.      Marland Kitchen omeprazole (PRILOSEC) 40 MG capsule Take 40 mg by mouth daily.      . pravastatin (PRAVACHOL) 80 MG tablet Take 1 tablet (80 mg total) by mouth every evening.  30 tablet  11  . promethazine-codeine (PHENERGAN WITH CODEINE) 6.25-10 MG/5ML syrup Take 5 mLs by mouth every 4 (four) hours as needed for cough.  150 mL  0  . Ranibizumab (LUCENTIS) 0.3 MG/0.05ML SOLN Inject 0.3 mg into the eye every 6 (six) weeks.      . traMADol-acetaminophen (ULTRACET) 37.5-325 MG per tablet Take 1 tablet by mouth every 4 (four) hours as needed for pain.  90 tablet  5  No current facility-administered medications for this visit.    Allergies as of 08/24/2013  . (No Known Allergies)    Family History  Problem Relation Age of Onset  . Hypertension Mother   . Diabetes Mother   . Hyperlipidemia Mother   . Rosacea Mother   . Hypertension Father   . Colon cancer Neg Hx     History   Social History  . Marital Status: Single    Spouse Name: N/A    Number of Children: N/A  . Years of Education: N/A   Social History Main Topics  . Smoking status: Former Games developer  . Smokeless tobacco: None     Comment: August 25, 2011 QUIT.   Marland Kitchen Alcohol Use: No  . Drug Use: No  . Sexual Activity: Yes    Birth Control/ Protection: None   Other Topics Concern  . None   Social History Narrative  . None    Review of  Systems: As mentioned in HPI.   Physical Exam: BP 120/80  Pulse 80  Temp(Src) 97.9 F (36.6 C) (Oral)  Ht 5' 7.5" (1.715 m)  Wt 124 lb 9.6 oz (56.518 kg)  BMI 19.22 kg/m2  LMP 07/25/2013 General:   Alert and oriented. No distress noted. Pleasant and cooperative.  Head:  Normocephalic and atraumatic. Eyes:  Conjuctiva clear without scleral icterus. Mouth:  Oral mucosa pink and moist. Good dentition. No lesions. Neck:  Supple, without mass or thyromegaly. Heart:  S1, S2 present without murmurs, rubs, or gallops. Regular rate and rhythm. Abdomen:  +BS, soft, mild TTP epigastric region and non-distended. No rebound or guarding. No HSM or masses noted. Msk:  Symmetrical without gross deformities. Normal posture. Extremities:  Without edema. Neurologic:  Alert and  oriented x4;  grossly normal neurologically. Skin:  Intact without significant lesions or rashes. Cervical Nodes:  No significant cervical adenopathy. Psych:  Alert and cooperative. Normal mood and affect.   [place] of Abd Aug 2014 IMPRESSION:  Evidence of mild cholesterolosis with echogenic cholesterol  crystals noted along the gallbladder wall without gallbladder wall  thickening. No gallstones or evidence of acute cholecystitis. No  other abnormalities  Lab Results  Component Value Date   ALT 24 04/28/2013   AST 21 04/28/2013   ALKPHOS 98 04/28/2013   BILITOT 0.3 04/28/2013   Lab Results  Component Value Date   WBC 8.7 04/30/2013   HGB 13.6 04/30/2013   HCT 39.6 04/30/2013   MCV 87.4 04/30/2013   PLT 251 04/30/2013

## 2013-08-24 NOTE — Patient Instructions (Signed)
We have scheduled you for an upper endoscopy with Dr. Darrick Penna in the near future.  Continue Prilosec each morning.   Do not take your Lantus the evening prior to the procedure. Make sure you keep a close eye on your blood sugar that evening and the morning of the procedure.

## 2013-08-31 ENCOUNTER — Encounter (HOSPITAL_COMMUNITY)
Admission: RE | Disposition: A | Discharge status: Home / Self Care | Payer: Self-pay | Source: Ambulatory Visit | Attending: Gastroenterology

## 2013-08-31 ENCOUNTER — Ambulatory Visit (HOSPITAL_COMMUNITY)
Admission: RE | Admit: 2013-08-31 | Discharge: 2013-08-31 | Disposition: A | Discharge status: Home / Self Care | Payer: BC Managed Care – PPO | Source: Ambulatory Visit | Attending: Gastroenterology | Admitting: Gastroenterology

## 2013-08-31 ENCOUNTER — Telehealth: Payer: Self-pay

## 2013-08-31 ENCOUNTER — Encounter (HOSPITAL_COMMUNITY): Payer: Self-pay | Admitting: *Deleted

## 2013-08-31 DIAGNOSIS — K3189 Other diseases of stomach and duodenum: Secondary | ICD-10-CM | POA: Insufficient documentation

## 2013-08-31 DIAGNOSIS — K449 Diaphragmatic hernia without obstruction or gangrene: Secondary | ICD-10-CM | POA: Insufficient documentation

## 2013-08-31 DIAGNOSIS — Z794 Long term (current) use of insulin: Secondary | ICD-10-CM | POA: Insufficient documentation

## 2013-08-31 DIAGNOSIS — K2960 Other gastritis without bleeding: Secondary | ICD-10-CM

## 2013-08-31 DIAGNOSIS — R1013 Epigastric pain: Secondary | ICD-10-CM

## 2013-08-31 DIAGNOSIS — K311 Adult hypertrophic pyloric stenosis: Secondary | ICD-10-CM

## 2013-08-31 DIAGNOSIS — E119 Type 2 diabetes mellitus without complications: Secondary | ICD-10-CM | POA: Insufficient documentation

## 2013-08-31 HISTORY — PX: ESOPHAGOGASTRODUODENOSCOPY: SHX5428

## 2013-08-31 SURGERY — EGD (ESOPHAGOGASTRODUODENOSCOPY)
Anesthesia: Moderate Sedation

## 2013-08-31 MED ORDER — MEPERIDINE HCL 100 MG/ML IJ SOLN
INTRAMUSCULAR | Status: AC
  Filled 2013-08-31: qty 2

## 2013-08-31 MED ORDER — PROMETHAZINE HCL 25 MG/ML IJ SOLN
INTRAMUSCULAR | Status: AC
  Filled 2013-08-31: qty 1

## 2013-08-31 MED ORDER — MEPERIDINE HCL 100 MG/ML IJ SOLN
INTRAMUSCULAR | Status: DC | PRN
  Administered 2013-08-31 (×4): 25 mg via INTRAVENOUS

## 2013-08-31 MED ORDER — BUTAMBEN-TETRACAINE-BENZOCAINE 2-2-14 % EX AERO
INHALATION_SPRAY | CUTANEOUS | Status: DC | PRN
  Administered 2013-08-31: 2 via TOPICAL

## 2013-08-31 MED ORDER — MIDAZOLAM HCL 5 MG/5ML IJ SOLN
INTRAMUSCULAR | Status: AC
  Filled 2013-08-31: qty 10

## 2013-08-31 MED ORDER — STERILE WATER FOR IRRIGATION IR SOLN
Status: DC | PRN
  Administered 2013-08-31: 11:00:00

## 2013-08-31 MED ORDER — ONDANSETRON HCL 4 MG PO TABS: 4.0000 mg | ORAL_TABLET | Freq: Three times a day (TID) | ORAL | Status: DC | PRN

## 2013-08-31 MED ORDER — SODIUM CHLORIDE 0.9 % IV SOLN
INTRAVENOUS | Status: DC
  Administered 2013-08-31: 1000 mL via INTRAVENOUS

## 2013-08-31 MED ORDER — MIDAZOLAM HCL 5 MG/5ML IJ SOLN
INTRAMUSCULAR | Status: DC | PRN
  Administered 2013-08-31 (×2): 1 mg via INTRAVENOUS
  Administered 2013-08-31 (×2): 2 mg via INTRAVENOUS
  Administered 2013-08-31: 1 mg via INTRAVENOUS
  Administered 2013-08-31: 2 mg via INTRAVENOUS

## 2013-08-31 MED ORDER — OMEPRAZOLE 40 MG PO CPDR: DELAYED_RELEASE_CAPSULE | ORAL | Status: DC

## 2013-08-31 MED ORDER — PROMETHAZINE HCL 25 MG/ML IJ SOLN: 12.5000 mg | Freq: Once | INTRAMUSCULAR | Status: DC

## 2013-08-31 NOTE — Telephone Encounter (Signed)
PT called and said she had a procedure this morning. She forgot to ask Dr. Darrick Penna to give her something for nausea and she would like something sent to [facility]. I told her to check with them in a little while, I will address with Gerrit Halls, NP and she will probably do so.

## 2013-08-31 NOTE — H&P (Signed)
Primary Care Physician:  Syliva Overman, MD Primary Gastroenterologist:  Dr. Darrick Penna  Pre-Procedure History & Physical: HPI:  Ashley Armstrong is a 47 y.o. female here for DYSPEPSIA.  Past Medical History  Diagnosis Date  . Nicotine addiction   . Anxiety   . IDDM (insulin dependent diabetes mellitus)   . Dyslipidemia   . Headache(784.0)   . TIA (transient ischemic attack)     Dr. Gerilyn Pilgrim    Past Surgical History  Procedure Laterality Date  . Laser surgery bilateral eye      Prior to Admission medications   Medication Sig Start Date End Date Taking? Authorizing Provider  diazepam (VALIUM) 10 MG tablet Take 10 mg by mouth 2 (two) times daily as needed for anxiety.   Yes Historical Provider, MD  insulin aspart (NOVOLOG) 100 UNIT/ML SOPN FlexPen Inject 7 Units into the skin 3 (three) times daily. 06/22/13  Yes Kerri Perches, MD  lisinopril (ZESTRIL) 2.5 MG tablet Take 1 tablet (2.5 mg total) by mouth daily. 06/14/13 06/14/14 Yes Kerri Perches, MD  omega-3 acid ethyl esters (LOVAZA) 1 G capsule Take 1 g by mouth daily.   Yes Historical Provider, MD  pravastatin (PRAVACHOL) 80 MG tablet Take 1 tablet (80 mg total) by mouth every evening. 04/30/13  Yes Kerri Perches, MD  gabapentin (NEURONTIN) 300 MG capsule Take 300 mg by mouth daily.     Historical Provider, MD  insulin glargine (LANTUS) 100 UNIT/ML injection Inject 25 Units into the skin daily. 04/28/13   Nishant Dhungel, MD  omeprazole (PRILOSEC) 40 MG capsule Take 40 mg by mouth daily.    Historical Provider, MD  Ranibizumab (LUCENTIS) 0.3 MG/0.05ML SOLN Inject 0.3 mg into the eye every 6 (six) weeks.    Historical Provider, MD  traMADol-acetaminophen (ULTRACET) 37.5-325 MG per tablet Take 1 tablet by mouth every 4 (four) hours as needed for pain. 04/15/13   Vickki Hearing, MD    Allergies as of 08/24/2013  . (No Known Allergies)    Family History  Problem Relation Age of Onset  . Hypertension Mother   . Diabetes  Mother   . Hyperlipidemia Mother   . Rosacea Mother   . Hypertension Father   . Colon cancer Neg Hx     History   Social History  . Marital Status: Single    Spouse Name: N/A    Number of Children: N/A  . Years of Education: N/A   Occupational History  . Not on file.   Social History Main Topics  . Smoking status: Former Games developer  . Smokeless tobacco: Not on file     Comment: August 25, 2011 QUIT.   Marland Kitchen Alcohol Use: No  . Drug Use: No  . Sexual Activity: Yes    Birth Control/ Protection: None   Other Topics Concern  . Not on file   Social History Narrative  . No narrative on file    Review of Systems: See HPI, otherwise negative ROS   Physical Exam: BP 128/82  Pulse 87  Temp(Src) 97.5 F (36.4 C) (Oral)  Resp 20  SpO2 97%  LMP 07/19/2013 General:   Alert,  pleasant and cooperative in NAD Head:  Normocephalic and atraumatic. Neck:  Supple; Lungs:  Clear throughout to auscultation.    Heart:  Regular rate and rhythm. Abdomen:  Soft, nontender and nondistended. Normal bowel sounds, without guarding, and without rebound.   Neurologic:  Alert and  oriented x4;  grossly normal neurologically.  Impression/Plan:  DYSPEPSIA  PLAN:  EGD TODAY

## 2013-08-31 NOTE — Telephone Encounter (Signed)
I sent in Zofran. Please have patient call with an update tomorrow.

## 2013-08-31 NOTE — Brief Op Note (Signed)
bs 296 reported to dr fields

## 2013-08-31 NOTE — Progress Notes (Signed)
REVIEWED.  

## 2013-09-01 ENCOUNTER — Telehealth: Payer: Self-pay | Admitting: Gastroenterology

## 2013-09-01 ENCOUNTER — Other Ambulatory Visit: Payer: Self-pay | Admitting: Gastroenterology

## 2013-09-01 DIAGNOSIS — G8929 Other chronic pain: Secondary | ICD-10-CM

## 2013-09-01 DIAGNOSIS — R6881 Early satiety: Secondary | ICD-10-CM

## 2013-09-01 NOTE — Telephone Encounter (Signed)
Routing to Dr. Fields.  

## 2013-09-01 NOTE — Telephone Encounter (Signed)
PLEASE CALL PT. She should wait for biopsy results. Otherwise she may follow th instructions on her discharge summary from her procedure.

## 2013-09-01 NOTE — Telephone Encounter (Signed)
REVIEWED.  

## 2013-09-01 NOTE — Telephone Encounter (Signed)
Stomach is burning feels like stomach is on fire, has some diarrhea and abdominal pain under the left rib cage this morning. Please advise

## 2013-09-01 NOTE — Telephone Encounter (Signed)
Called and spoke with pt. She said she had some Zofran left over from the ED and used that last night for her nausea, and will pick up the new RX. She is still having the burning and abdominal pain and wanted to know if Dr. Darrick Penna has any recommendations. Please advise!

## 2013-09-01 NOTE — Telephone Encounter (Signed)
See phone note of 09/01/2013.

## 2013-09-02 NOTE — Telephone Encounter (Signed)
Called and informed pt.  

## 2013-09-02 NOTE — Telephone Encounter (Signed)
Called and informed pt. She wants to know if she can increase Omeprazole to bid. Also, she wants to know if she can follow up in Dec instead of 3 months, due to insurance.

## 2013-09-02 NOTE — Telephone Encounter (Signed)
Please call pt. HER stomach Bx shows gastritis WHICH CAN CAUSE BURNING ABDOMINAL PAIN AND NAUSEA.   EAT 4 TO 6 SMALL MEALS DAILY. CONTINUE OMEPRAZOLE. FOLLOW A DIABETIC STEP III GASTROPARESIS DIET.  COMPLETE A GASTRIC EMPTYING STUDY NEXT WEEK.  FOLLOW UP IN 3 MOS E30 AS OR SLF DX: DYSPEPSIA.

## 2013-09-02 NOTE — Telephone Encounter (Signed)
I called pt and she said she will continue the Omeprazole 40 mg qd at this time. She will call if she has problems.

## 2013-09-02 NOTE — Op Note (Signed)
[facility] [address] [place], [address]   ENDOSCOPY PROCEDURE REPORT  PATIENT: Ashley Armstrong, Ashley Armstrong  MR#: [id number] BIRTHDATE: 1966-08-06 , 46  yrs. old GENDER: Female  ENDOSCOPIST: Jonette Eva, MD REFFERED [id number] Lodema Hong, M.D.  PROCEDURE DATE:  08/31/2013 PROCEDURE:   EGD with balloon dilatation and EGD with biopsy  INDICATIONS:1.  dyspepsia.   2.  PMHx: DIABETES MEDICATIONS: Demerol 100 mg IV and Versed 9 mg IV TOPICAL ANESTHETIC: Cetacaine Spray  DESCRIPTION OF PROCEDURE:   After the risks benefits and alternatives of the procedure were thoroughly explained, informed consent was obtained.  The EG-[id number] ([id number])  endoscope was introduced through the mouth and advanced to the second portion of the duodenum. The instrument was slowly withdrawn as the mucosa was carefully examined.  Prior to withdrawal of the scope, the guidwire was placed.  The esophagus was dilated successfully.  The patient was recovered in endoscopy and discharged home in satisfactory condition.   ESOPHAGUS: The mucosa of the esophagus appeared normal.   A small hiatal hernia was noted.   STOMACH: Moderate erosive gastritis (inflammation) was found in the gastric antrum and gastric body. Multiple biopsies were performed.   A stenosis traversable after dilation was found at the pylorus.   DUODENUM: The duodenal mucosa showed no abnormalities in the bulb and second portion of the duodenum.   Dilation was then performed at the pylorus Dilator: Balloon Size(s): 11-12 MM Resistance: minimal Heme: yes Appearance: adequate ABLE TO PASS SCOPE WITHOUT RESISTANCE FOLLOWING DILATION.  COMPLICATIONS: There were no complications.  ENDOSCOPIC IMPRESSION: 1.   EARKY SATIETY NAUSEA MAY BE DUE TO GASTROPARESIS/PYLORIC CHANNEL STENOSIS. 2.   Small hiatal hernia 3.   MODERATE Erosive gastritis  RECOMMENDATIONS: EAT 4 TO 6 SMALL MEALS DAILY. CONTINUE OMEPRAZOLE. FOLLOW A DIABETIC STEP III  GASTROPARESIS DIET. BIOPSY WILL BE BACK IN 7 DAYS. COMPLETE A GASTRIC EMPTYING STUDY NEXT WEEK.  FOLLOW UP IN 3 MOS.      _______________________________ Rosalie DoctorJonette Eva, MD 09/02/2013 1:19 PM      PATIENT NAME:  Ashley Armstrong, Ashley Armstrong MR#: [id number]

## 2013-09-02 NOTE — Telephone Encounter (Signed)
PLEASE CALL PT. SHE IS TAKING OMERAZOLE 40 MG DAILY. IF SHE WANTS BID DOSING SHE NEEDS TO CHANGE TO OMEPRAZOLE TO 20 MG TABLETS. SHE MAY FOLLOW UP LATE DEC 2014.

## 2013-09-03 ENCOUNTER — Encounter (HOSPITAL_COMMUNITY): Payer: Self-pay | Admitting: Gastroenterology

## 2013-09-03 NOTE — Telephone Encounter (Signed)
Pt is aware of OV on 1/21 at 10 with SF

## 2013-09-06 ENCOUNTER — Encounter: Payer: Self-pay | Admitting: Gastroenterology

## 2013-09-06 ENCOUNTER — Encounter (HOSPITAL_COMMUNITY)
Admission: RE | Admit: 2013-09-06 | Discharge: 2013-09-06 | Disposition: A | Discharge status: Home / Self Care | Payer: BC Managed Care – PPO | Source: Ambulatory Visit | Attending: Gastroenterology | Admitting: Gastroenterology

## 2013-09-06 ENCOUNTER — Encounter (HOSPITAL_COMMUNITY): Payer: Self-pay

## 2013-09-06 DIAGNOSIS — R1013 Epigastric pain: Secondary | ICD-10-CM | POA: Insufficient documentation

## 2013-09-06 DIAGNOSIS — R6881 Early satiety: Secondary | ICD-10-CM | POA: Insufficient documentation

## 2013-09-06 DIAGNOSIS — G8929 Other chronic pain: Secondary | ICD-10-CM | POA: Insufficient documentation

## 2013-09-06 MED ORDER — TECHNETIUM TC 99M SULFUR COLLOID
2.0000 | Freq: Once | INTRAVENOUS | Status: AC | PRN
  Administered 2013-09-06: 2 via INTRAVENOUS

## 2013-09-06 NOTE — Telephone Encounter (Signed)
PLEASE CALL PT. HER GASTRIC EMPTYING STUDY SHOWS HER 75% OF SOLID FOOD STAYS IN HER STOMACH AFTER 2 HRS. SHE HAS DELAYED GASTRIC EMPTYING DUE TO DIABETES. THE DAIGNOSIS IS GASTROPARESIS. IT IS THE REASON FOR HER NAUSEA, AND BLOATING AFTER EATING.   EAT 4 TO 6 SMALL MEALS DAILY. CONTINUE OMEPRAZOLE.  STRICTLY ADHERE TO A DIABETIC STEP III GASTROPARESIS DIET.   FOLLOW UP IN 3 MOS E30 SLF GASTROPARESIS.

## 2013-09-07 ENCOUNTER — Telehealth: Payer: Self-pay | Admitting: Family Medicine

## 2013-09-07 NOTE — Telephone Encounter (Signed)
Pt returned call and was informed.  

## 2013-09-07 NOTE — Telephone Encounter (Signed)
noted 

## 2013-09-07 NOTE — Telephone Encounter (Signed)
Tried to call pt. Bad connection. She said she will call back.

## 2013-09-09 ENCOUNTER — Telehealth: Payer: Self-pay | Admitting: Orthopedic Surgery

## 2013-09-09 NOTE — Telephone Encounter (Signed)
Spoke with patient and advised her there were some notes in her chart from Dr. Darrick Penna that I could have Dr. Romeo Apple review, however, the patient states she has a lot going on, and she is supposed to see Dr. Gerilyn Pilgrim in November. She states she will see him and then decide if she still needs the surgery, and she will call us back.

## 2013-09-09 NOTE — Telephone Encounter (Signed)
Ashley Armstrong asked if Dr. Romeo Apple has received the information from Dr. Darrick Penna so she can schedule CTS. Her # [phone]

## 2013-09-17 ENCOUNTER — Telehealth: Payer: Self-pay | Admitting: Family Medicine

## 2013-09-17 ENCOUNTER — Other Ambulatory Visit: Payer: Self-pay | Admitting: Family Medicine

## 2013-09-17 NOTE — Telephone Encounter (Signed)
Has been put on a diet from GI and wanted to know what type of vitamin to take, I advised a regular multivitamin but to check with GI to makes sure that was sufficient.

## 2013-10-04 ENCOUNTER — Telehealth: Payer: Self-pay | Admitting: Orthopedic Surgery

## 2013-10-04 NOTE — Telephone Encounter (Signed)
Patient called back to follow up about the pending surgery, carpal tunnel release, left, and asked if we had been sent a copy of report from Dr. Ronal Fear evaluation 09/13/13, per prmary care,  Dr. Claris Che Simpson's referral. We have just received a copy of the report and it is in Dr. Mort Sawyers box for review.  Her ph# [phone].

## 2013-10-05 ENCOUNTER — Ambulatory Visit: Payer: BC Managed Care – PPO | Admitting: Family Medicine

## 2013-10-05 NOTE — Telephone Encounter (Signed)
Reiterated with patient what Okey Regal had told her, and advised her we would be in touch with her after Dr. Romeo Apple reviews the notes.

## 2013-10-25 ENCOUNTER — Other Ambulatory Visit: Payer: Self-pay | Admitting: Orthopedic Surgery

## 2013-10-25 DIAGNOSIS — M7712 Lateral epicondylitis, left elbow: Secondary | ICD-10-CM

## 2013-10-25 DIAGNOSIS — M75102 Unspecified rotator cuff tear or rupture of left shoulder, not specified as traumatic: Secondary | ICD-10-CM

## 2013-10-25 MED ORDER — TRAMADOL-ACETAMINOPHEN 37.5-325 MG PO TABS: 1.0000 | ORAL_TABLET | ORAL | Status: DC | PRN

## 2013-10-29 ENCOUNTER — Other Ambulatory Visit: Payer: Self-pay | Admitting: Family Medicine

## 2013-11-09 ENCOUNTER — Ambulatory Visit (INDEPENDENT_AMBULATORY_CARE_PROVIDER_SITE_OTHER): Payer: BC Managed Care – PPO | Admitting: Family Medicine

## 2013-11-09 ENCOUNTER — Encounter: Payer: Self-pay | Admitting: Family Medicine

## 2013-11-09 VITALS — BP 118/78 | HR 94 | Resp 16 | Ht 67.5 in | Wt 120.1 lb

## 2013-11-09 DIAGNOSIS — I10 Essential (primary) hypertension: Secondary | ICD-10-CM

## 2013-11-09 DIAGNOSIS — B351 Tinea unguium: Secondary | ICD-10-CM

## 2013-11-09 DIAGNOSIS — F411 Generalized anxiety disorder: Secondary | ICD-10-CM

## 2013-11-09 DIAGNOSIS — E785 Hyperlipidemia, unspecified: Secondary | ICD-10-CM

## 2013-11-09 DIAGNOSIS — N950 Postmenopausal bleeding: Secondary | ICD-10-CM

## 2013-11-09 DIAGNOSIS — IMO0001 Reserved for inherently not codable concepts without codable children: Secondary | ICD-10-CM

## 2013-11-09 DIAGNOSIS — E1065 Type 1 diabetes mellitus with hyperglycemia: Secondary | ICD-10-CM

## 2013-11-09 MED ORDER — CLOTRIMAZOLE-BETAMETHASONE 1-0.05 % EX CREA: 1.0000 "application " | TOPICAL_CREAM | Freq: Two times a day (BID) | CUTANEOUS | Status: DC

## 2013-11-09 NOTE — Progress Notes (Signed)
   Subjective:    Patient ID: Ashley Armstrong, female    DOB: [birth year], 47 y.o.   MRN: [id number]  HPI The PT is here for follow up and re-evaluation of chronic medical conditions, medication management and review of any available recent lab and radiology data.  Preventive health is updated, specifically  Cancer screening and Immunization.  States her asessment with gyne was incomplete, has not returned, but after discussion with gyne , she does indeed need to return for fruther eval C/o left breast pain in area of previous biopsy, just doesn't feel right Still has largely high blood sugars, with a lot of fluctuation ,a nd remains bothered by her low weight. Is still c/o neck and left upper ext pain has had the benefit ot both neuro and ortho evaluation of this    Review of Systems See HPI Denies recent fever or chills. Denies sinus pressure, nasal congestion, ear pain or sore throat. Denies chest congestion, productive cough or wheezing. Denies chest pains, palpitations and leg swelling Denies abdominal pain, nausea, vomiting,diarrhea or constipation.   Denies dysuria, frequency, hesitancy or incontinence.  Denies headaches, or  Seizures. Denies uncontrolled  depression, anxiety or insomnia. till c/oincreased anxiety which she attributes tom poor health C/o thickened great toe nail and itchy skin of her feet, wants podiatry to help with nail care in prticular       Objective:   Physical Exam  Patient alert and oriented and in no cardiopulmonary distress.  HEENT: No facial asymmetry, EOMI, no sinus tenderness,  oropharynx pink and moist.  Neck supple no adenopathy.  Chest: Clear to auscultation bilaterally. Breast: left breast tender in upper outer quadrant, no palpable mass, no nippled/c , no skin erythema CVS: S1, S2 no murmurs, no S3.  ABD: Soft non tender. Bowel sounds normal.  Ext: No edema  MS: Adequate ROM spine, shoulders, hips and knees.  Skin: Intact, mild  tinea pedis , marked onychomycosis Psych: Good eye contact, normal affect. Memory intact  anxious not depressed appearing.  CNS: CN 2-12 intact, power, tone and sensation normal throughout.       Assessment & Plan:

## 2013-11-09 NOTE — Patient Instructions (Addendum)
F/u in 4 month, call if you need me before  You are referred to podiatrist   Continue antifungal medication for an additional 2 month, and an antifungal cream will also be sent in  Breast exam today reveals no mass, keep f/u appt with radiology  I will contact Dr Donzetta Matters about your bleeding, aftewr speaking with her, you need to call and schedule a f/u with her

## 2013-11-12 ENCOUNTER — Telehealth: Payer: Self-pay | Admitting: Family Medicine

## 2013-11-12 DIAGNOSIS — N644 Mastodynia: Secondary | ICD-10-CM

## 2013-11-12 NOTE — Telephone Encounter (Signed)
Patient has spoke with the [facility] and she will need a referral for a mammogram please put in patient would like to go to [facility]

## 2013-11-12 NOTE — Telephone Encounter (Signed)
pls let pt know she had a normal CXR less than 4 months ago, breast imaging is all she needs at this time

## 2013-11-12 NOTE — Telephone Encounter (Signed)
Referral has been entered.

## 2013-11-15 ENCOUNTER — Ambulatory Visit (INDEPENDENT_AMBULATORY_CARE_PROVIDER_SITE_OTHER): Payer: BC Managed Care – PPO | Admitting: Family Medicine

## 2013-11-15 ENCOUNTER — Telehealth: Payer: Self-pay

## 2013-11-15 ENCOUNTER — Encounter: Payer: Self-pay | Admitting: Family Medicine

## 2013-11-15 VITALS — BP 104/60 | HR 82 | Temp 98.5°F | Resp 18 | Ht 67.5 in | Wt 121.0 lb

## 2013-11-15 DIAGNOSIS — IMO0002 Reserved for concepts with insufficient information to code with codable children: Secondary | ICD-10-CM

## 2013-11-15 DIAGNOSIS — E1165 Type 2 diabetes mellitus with hyperglycemia: Secondary | ICD-10-CM

## 2013-11-15 DIAGNOSIS — H6590 Unspecified nonsuppurative otitis media, unspecified ear: Secondary | ICD-10-CM

## 2013-11-15 DIAGNOSIS — IMO0001 Reserved for inherently not codable concepts without codable children: Secondary | ICD-10-CM

## 2013-11-15 DIAGNOSIS — J029 Acute pharyngitis, unspecified: Secondary | ICD-10-CM

## 2013-11-15 DIAGNOSIS — L049 Acute lymphadenitis, unspecified: Secondary | ICD-10-CM

## 2013-11-15 DIAGNOSIS — E1065 Type 1 diabetes mellitus with hyperglycemia: Secondary | ICD-10-CM

## 2013-11-15 DIAGNOSIS — L040 Acute lymphadenitis of face, head and neck: Secondary | ICD-10-CM

## 2013-11-15 DIAGNOSIS — H6592 Unspecified nonsuppurative otitis media, left ear: Secondary | ICD-10-CM | POA: Insufficient documentation

## 2013-11-15 DIAGNOSIS — Z794 Long term (current) use of insulin: Secondary | ICD-10-CM

## 2013-11-15 LAB — POCT RAPID STREP A (OFFICE): RAPID STREP A SCREEN: NEGATIVE

## 2013-11-15 MED ORDER — PENICILLIN V POTASSIUM 500 MG PO TABS: 500.0000 mg | ORAL_TABLET | Freq: Three times a day (TID) | ORAL | Status: DC

## 2013-11-15 MED ORDER — FIRST-DUKES MOUTHWASH MT SUSP: OROMUCOSAL | Status: DC

## 2013-11-15 NOTE — Telephone Encounter (Signed)
Patient aware.

## 2013-11-15 NOTE — Assessment & Plan Note (Signed)
Thickened left great toenail, with mild distal thickening of several other toes, continue oral med for an additional 2 months. Refer to podiatry for foot care Antifungal cream for use between toes where mild fungal rash present

## 2013-11-15 NOTE — Patient Instructions (Addendum)
F/u as before  You are treated for left otitis and left cervical adenitis with penicillin  Dukes mouthwash sent in for pain in the throat

## 2013-11-15 NOTE — Telephone Encounter (Signed)
Work in , when she comes pls do throat swab, I will see her as she has the other complaints

## 2013-11-15 NOTE — Assessment & Plan Note (Signed)
Controlled, no change in medication DASH diet and commitment to daily physical activity for a minimum of 30 minutes discussed and encouraged, as a part of hypertension management. The importance of attaining a healthy weight is also discussed.  

## 2013-11-15 NOTE — Telephone Encounter (Signed)
Patient called stating that she has a sore throat x 3 days with what she describes as white patches in the back of her throat.  She has had headache and ear pain as well.  Please advise.

## 2013-11-15 NOTE — Assessment & Plan Note (Signed)
Updated lab needed, will  Check with endo

## 2013-11-15 NOTE — Telephone Encounter (Signed)
Front staff notified to schedule.

## 2013-11-15 NOTE — Assessment & Plan Note (Signed)
Needs gyne follow up, no furhter episodes, but needs to see gyne, I also spoke with gyne personally

## 2013-11-15 NOTE — Assessment & Plan Note (Signed)
Unchanged, needs improved control of her anxiety,has multiple somatic complaints, management to continue through psych

## 2013-11-15 NOTE — Assessment & Plan Note (Signed)
Still reports markedly elevated blood sugars and fluctuations alos. Patient advised to reduce carb and sweets, commit to regular physical activity, take meds as prescribed, test blood as directed, and attempt to lose weight, to improve blood sugar control.

## 2013-11-21 NOTE — Assessment & Plan Note (Signed)
No exudate noted, mild pharyngeal eryhtema, dukes mouthwash  For pain

## 2013-11-21 NOTE — Assessment & Plan Note (Signed)
10 day course of PCN prescribed

## 2013-11-21 NOTE — Progress Notes (Signed)
   Subjective:    Patient ID: Ashley Armstrong, female    DOB: [birth year], 48 y.o.   MRN: [id number]  HPI 2 day h/o bilateral ear pain , left greater than right, as well as sore throat . Denies chest congestion or cough.c/o painful sore neck glands. No other concerns at this visit   Review of Systems See HPI Denies recent fever has had  chills. Denies sinus pressure or  nasal congestion Denies chest congestion, productive cough or wheezing. Denies chest pains, palpitations and leg swelling Denies abdominal pain, nausea, vomiting,diarrhea or constipation.    Denies depression, has chronic anxiety denies  insomnia. Denies skin break down or rash.        Objective:   Physical Exam  Patient alert and oriented and in no cardiopulmonary distress.  HEENT: No facial asymmetry, EOMI, no sinus tenderness,  oropharynx pink and moist.No exudtae  Neck supple, left anterior cervical adenopathy.Left TM dull with mild eryhtema  Chest: Clear to auscultation bilaterally.  CVS: S1, S2 no murmurs, no S3.  ABD: Soft non tender. Bowel sounds normal.  Ext: No edema   Psych: Good eye contact, normal affect. Memory intact not anxious or depressed appearing.  CNS: CN 2-12 intact, power, tone and sensation normal throughout.`       Assessment & Plan:

## 2013-11-21 NOTE — Assessment & Plan Note (Signed)
Some improvement , pt to continue under care of endocrinologist

## 2013-11-21 NOTE — Assessment & Plan Note (Signed)
10 day course of PCN based on symptom

## 2013-11-23 ENCOUNTER — Telehealth: Payer: Self-pay

## 2013-11-23 NOTE — Telephone Encounter (Signed)
Patient aware and will give med more time to work

## 2013-11-23 NOTE — Telephone Encounter (Signed)
Calling back because she has 2 days left of penicillin and she is still blowing greenish blood tinged mucus out of her nose and its draining down her throat and her taste is gone and glands still swollen in neck. Wants to know if she needs to give the meds more time to work of if there is anything else she can take. Please advise

## 2013-11-23 NOTE — Telephone Encounter (Signed)
More time to work, she should  submit sputum for c/s if remains concerned , takes about 2 to 3 weeks to totally recover , medication will still be acting and body healing

## 2013-11-24 ENCOUNTER — Ambulatory Visit (HOSPITAL_COMMUNITY)
Admission: RE | Admit: 2013-11-24 | Discharge: 2013-11-24 | Disposition: A | Discharge status: Home / Self Care | Payer: BC Managed Care – PPO | Source: Ambulatory Visit | Attending: Family Medicine | Admitting: Family Medicine

## 2013-11-24 DIAGNOSIS — N644 Mastodynia: Secondary | ICD-10-CM

## 2013-11-24 DIAGNOSIS — Z1231 Encounter for screening mammogram for malignant neoplasm of breast: Secondary | ICD-10-CM | POA: Insufficient documentation

## 2013-12-01 ENCOUNTER — Ambulatory Visit: Payer: BC Managed Care – PPO | Admitting: Gastroenterology

## 2013-12-07 ENCOUNTER — Ambulatory Visit (INDEPENDENT_AMBULATORY_CARE_PROVIDER_SITE_OTHER): Payer: BC Managed Care – PPO | Admitting: Orthopedic Surgery

## 2013-12-07 ENCOUNTER — Encounter: Payer: Self-pay | Admitting: Orthopedic Surgery

## 2013-12-07 VITALS — BP 139/92 | Ht 67.5 in | Wt 121.0 lb

## 2013-12-07 DIAGNOSIS — M542 Cervicalgia: Secondary | ICD-10-CM

## 2013-12-07 DIAGNOSIS — N058 Unspecified nephritic syndrome with other morphologic changes: Secondary | ICD-10-CM

## 2013-12-07 DIAGNOSIS — M75102 Unspecified rotator cuff tear or rupture of left shoulder, not specified as traumatic: Secondary | ICD-10-CM

## 2013-12-07 DIAGNOSIS — M5030 Other cervical disc degeneration, unspecified cervical region: Secondary | ICD-10-CM | POA: Insufficient documentation

## 2013-12-07 DIAGNOSIS — E1129 Type 2 diabetes mellitus with other diabetic kidney complication: Secondary | ICD-10-CM

## 2013-12-07 DIAGNOSIS — G5600 Carpal tunnel syndrome, unspecified upper limb: Secondary | ICD-10-CM

## 2013-12-07 DIAGNOSIS — M67919 Unspecified disorder of synovium and tendon, unspecified shoulder: Secondary | ICD-10-CM

## 2013-12-07 DIAGNOSIS — M719 Bursopathy, unspecified: Secondary | ICD-10-CM

## 2013-12-07 DIAGNOSIS — E1121 Type 2 diabetes mellitus with diabetic nephropathy: Secondary | ICD-10-CM

## 2013-12-07 NOTE — Progress Notes (Signed)
Patient ID: Ashley Armstrong, female   DOB: 16-Apr-1966, 48 y.o.   MRN: [id number] Chief Complaint  Patient presents with  . Follow-up    Recheck left shoulder and elbow and review NCS results    The patient has moderate bilateral carpal tunnel syndrome with polyneuropathy she is diabetic she's got cervical disc disease complains of bilateral shoulder pain. She is not a good surgical candidate. She has a 50% chance of any improvement in her carpal tunnel symptoms if she has surgery. She will make the decision whether or not she wants to take that risk understanding that she might not get better. She is not a surgical candidate for cervical disc disease, it is not bad enough for surgery. She has shoulder pain without rotator cuff tear does not need surgery.  I wish her luck if she wants to we will do the surgery as long as she understands the risks  Time spent 15 minutes  Encounter Diagnoses  Name Primary?  . Rotator cuff syndrome of left shoulder Yes  . Neck pain   . Diabetic nephropathy   . Carpal tunnel syndrome   . Degenerative disc disease, cervical

## 2013-12-08 ENCOUNTER — Telehealth: Payer: Self-pay | Admitting: Orthopedic Surgery

## 2013-12-08 NOTE — Telephone Encounter (Signed)
Per patient request, as discussed with Dr. Aline Brochure, office note, date of service 12/07/13, faxed with signed authorization, to attention: Charlena Oyster - [phone], to fax [phone], Disability Determination Services.  Patient aware of status.

## 2013-12-15 ENCOUNTER — Ambulatory Visit (INDEPENDENT_AMBULATORY_CARE_PROVIDER_SITE_OTHER): Payer: BC Managed Care – PPO | Admitting: Gastroenterology

## 2013-12-15 ENCOUNTER — Encounter: Payer: Self-pay | Admitting: Gastroenterology

## 2013-12-15 VITALS — BP 105/66 | HR 88 | Temp 98.0°F | Wt 117.2 lb

## 2013-12-15 DIAGNOSIS — K3184 Gastroparesis: Secondary | ICD-10-CM

## 2013-12-15 NOTE — Assessment & Plan Note (Addendum)
SX CONTROLLED.  CONTINUE GASTROPARESIS DIET. OPV IN 6 MOS

## 2013-12-15 NOTE — Progress Notes (Signed)
Reminder in epic °

## 2013-12-15 NOTE — Patient Instructions (Signed)
EAT 4 TO 6 SMALL MEALS DAILY.   CONTINUE OMEPRAZOLE.   FOLLOW A DIABETIC GASTROPARESIS DIET.   FOLLOW UP IN 4 MOS.

## 2013-12-15 NOTE — Progress Notes (Signed)
Subjective:    Patient ID: Ashley Armstrong, female    DOB: February 16, 1966, 48 y.o.   MRN: [id number]  [name], MD  HPI BORN AGAIN: OCT 2014. HAVING TROUBLE WITH DIABETES & TOOTH. HAD PAIN IN LEFT EAR AND DRAINAGE IN L THROAT. TOOK PCN AND SX BETTER BUT NOT RESOLVED. OCCASIONAL NAUSEA AFTER EATING. TAKING ZOFRAN AS NEEDED(1-2X/MO). OMEPRAZOLE HELPS BURNING IN STOMACH. WEIGHT DOWN TO 117 LBS. LAST VOMITED: OCT 2014. EATS PRETTY GOOD. BMs: Q1-4 DAYS. MAY HAVE DIARRHEA. PT DENIES FEVER, CHILLS, BRBPR, melena, constipation, abd pain, problems swallowing, problems with sedation, OR heartburn or indigestion.   Past Medical History  Diagnosis Date  . Nicotine addiction   . Anxiety   . IDDM (insulin dependent diabetes mellitus)   . Dyslipidemia   . Headache(784.0)   . TIA (transient ischemic attack)     Dr. Merlene Laughter  . Gastroparesis due to DM OCT 2014    75% AT 2 HRS, GLU >    Past Surgical History  Procedure Laterality Date  . Laser surgery bilateral eye    . Esophagogastroduodenoscopy N/A 08/31/2013    Procedure: ESOPHAGOGASTRODUODENOSCOPY (EGD);  Surgeon: Danie Binder, MD;  Location: [facility];  Service: Endoscopy;  Laterality: N/A;  11:15   No Known Allergies  Current Outpatient Prescriptions  Medication Sig Dispense Refill  . diazepam (VALIUM) 10 MG tablet Take 10 mg by mouth 2 (two) times daily as needed for anxiety.      . Diphenhyd-Hydrocort-Nystatin (FIRST-DUKES MOUTHWASH) SUSP 10 cc 3 times daily ,gargle , swish and swallow    . gabapentin (NEURONTIN) 300 MG capsule Take 300 mg by mouth daily.     . insulin aspart (NOVOLOG) 100 UNIT/ML SOPN FlexPen Inject 7 Units into the skin 3 (three) times daily.    . insulin glargine (LANTUS) 100 UNIT/ML injection Inject 25 Units into the skin daily.    Marland Kitchen lisinopril (ZESTRIL) 2.5 MG tablet Take 1 tablet (2.5 mg total) by mouth daily.    Marland Kitchen omega-3 acid ethyl esters (LOVAZA) 1 G capsule Take 1 g by mouth daily.    Marland Kitchen omeprazole  (PRILOSEC) 40 MG capsule 1 po 30 mins prior to breakfast    . ondansetron (ZOFRAN) 4 MG tablet Take 1 tablet (4 mg total) by mouth every 8 (eight) hours as needed for nausea.    . penicillin v potassium (VEETID) 500 MG tablet Take 1 tablet (500 mg total) by mouth 3 (three) times daily. LAST MO   . pravastatin (PRAVACHOL) 80 MG tablet Take 1 tablet (80 mg total) by mouth every evening.    . Ranibizumab (LUCENTIS) 0.3 MG/0.05ML SOLN Inject 0.3 mg into the eye every 6 (six) weeks.    . terbinafine (LAMISIL) 250 MG tablet TAKE ONE (1) TABLET BY MOUTH EVERY DAY    . traMADol-acetaminophen (ULTRACET) 37.5-325 MG per tablet Take 1 tablet by mouth every 4 (four) hours as needed.    . clotrimazole-betamethasone (LOTRISONE) cream Apply 1 application topically 2 (two) times daily.         Review of Systems     Objective:   Physical Exam  Vitals reviewed. Constitutional: She is oriented to person, place, and time. She appears well-nourished. No distress.  HENT:  Head: Normocephalic and atraumatic.  Mouth/Throat: Oropharynx is clear and moist. No oropharyngeal exudate.  Eyes: Pupils are equal, round, and reactive to light. No scleral icterus.  Neck: Normal range of motion. Neck supple.  Cardiovascular: Normal rate, regular rhythm and normal heart  sounds.   Pulmonary/Chest: Effort normal and breath sounds normal. No respiratory distress.  Abdominal: Soft. Bowel sounds are normal. She exhibits no distension. There is no tenderness.  Musculoskeletal: She exhibits no edema.  Neurological: She is alert and oriented to person, place, and time.  NO  NEW FOCAL DEFICITS   Psychiatric: She has a normal mood and affect.          Assessment & Plan:

## 2013-12-16 NOTE — Progress Notes (Signed)
cc'd to pcp 

## 2013-12-16 NOTE — Progress Notes (Signed)
DISABILITY LETTER WRITTEN AND FAXED. FEB 5.

## 2013-12-31 ENCOUNTER — Telehealth: Payer: Self-pay | Admitting: Family Medicine

## 2013-12-31 DIAGNOSIS — M5030 Other cervical disc degeneration, unspecified cervical region: Secondary | ICD-10-CM

## 2013-12-31 DIAGNOSIS — M25511 Pain in right shoulder: Secondary | ICD-10-CM

## 2013-12-31 NOTE — Telephone Encounter (Signed)
Dr Ruthe Mannan note is reviewed and he states the problem in her neck is not severe enough for surgery based on info she has so no benefit from what I can see in having this evaluation , however if she wants a 3rd opinion pls enter referral to neurosurgeon , Ledell Noss is closest, Dr Glenna Fellows to  eval pt with neck pain, pls also request MRI C spine from Dr Aline Brochure, if he does not have this, then Dr Merlene Laughter has been also evaluating and his notes would be helpful to Dr Carloyn Manner.  I will not be ordering any images, she has had many, and the specialist who sees her will determine what she needs.  No need for her to be seen in the office by  Me next week for neck, needs to go to the specialist she is requesting, she has had 2 recent specialist opinions on this recently , Dr Aline Brochure and Dr Merlene Laughter

## 2013-12-31 NOTE — Telephone Encounter (Signed)
Has been having pain in right shoulder blade down to her elbow for 1/1/2 months. Dr Aline Brochure thinks she needs to see a neck specialist and pt states Dr Merlene Laughter said he couldn't do anything for her bulging discs in her neck either. Pt advised to make appt for neck week also. Any other recommendations?

## 2013-12-31 NOTE — Telephone Encounter (Signed)
Patient aware and referred to Dr Carloyn Manner and sent request to [facility] for images and last note to be faxed

## 2013-12-31 NOTE — Addendum Note (Signed)
Addended by: Eual Fines on: 12/31/2013 02:34 PM   Modules accepted: Orders

## 2014-01-03 ENCOUNTER — Telehealth: Payer: Self-pay | Admitting: Gastroenterology

## 2014-01-03 NOTE — Telephone Encounter (Signed)
Pt called today to follow up on a letter that SF was going to do for her to send in for disability. I seen in patient's chart that [facility] typed letter and faxed it on Feb 5. Patient now wants a copy of this letter and I don't see it showing in epic anywhere. Is there a way we can get a copy for the patient? Please advise [phone]

## 2014-01-04 NOTE — Telephone Encounter (Signed)
CALL PT TO PICKUP COPY.

## 2014-01-04 NOTE — Telephone Encounter (Signed)
Called and left message on Vm that her copy is at the front desk.

## 2014-01-05 ENCOUNTER — Ambulatory Visit (HOSPITAL_COMMUNITY)
Admission: RE | Admit: 2014-01-05 | Discharge: 2014-01-05 | Disposition: A | Discharge status: Home / Self Care | Payer: BC Managed Care – PPO | Source: Ambulatory Visit | Attending: Family Medicine | Admitting: Family Medicine

## 2014-01-05 ENCOUNTER — Telehealth: Payer: Self-pay | Admitting: Family Medicine

## 2014-01-05 DIAGNOSIS — M25511 Pain in right shoulder: Secondary | ICD-10-CM

## 2014-01-05 DIAGNOSIS — M25519 Pain in unspecified shoulder: Secondary | ICD-10-CM | POA: Insufficient documentation

## 2014-01-05 NOTE — Telephone Encounter (Signed)
I advised the pt the last time I talked to her that you would not be ordering any xrays and she was referred to [facility]. She states she hasn't heard anything from Dr Carloyn Manner and wants an xray of her rotator cuff right arm- that something is wrong. Please advise

## 2014-01-05 NOTE — Telephone Encounter (Signed)
Xray ordered and referred to Ortho for 2nd opinion in [place]. States she keeps calling [facility] and they won't get her in to be seen

## 2014-01-05 NOTE — Addendum Note (Signed)
Addended by: Eual Fines on: 01/05/2014 10:24 AM   Modules accepted: Orders

## 2014-01-05 NOTE — Telephone Encounter (Signed)
pls order x ray of her left shoulder, she had one in June, she may also be referred for 2nd ortho opinion, there is an ortho in [place] to eval the left shoulder , Better to have the ortho Doc who she sees for 2nd opinion order the xray in the office there, give her the option , let me know what she wants and I will be asking you to enter  Referrals I will sign

## 2014-01-05 NOTE — Telephone Encounter (Signed)
Patient already referred to Dr Carloyn Manner and she was wanting something done today and I told her a referral to [place] would take time and no guarantee that she would get in before she sees Dr Carloyn Manner. Xray ordered per pt request of RIGHT shoulder. Not left

## 2014-01-07 NOTE — Telephone Encounter (Signed)
Pt came by the office to pick up letter. She wanted me to weigh her and she weighed 114.8 lbs.  She said Dr. Oneida Alar had said if she dropped below 117 lbs to let her know, she might have to scope her again.  She was also concerned that the hiatal hernia could have come from her lifting heavy boxes at work.   She would like to know if Dr. Oneida Alar does need to repeat EGD. If so, she would like to do so before April due to insurance.

## 2014-01-12 ENCOUNTER — Telehealth: Payer: Self-pay | Admitting: Orthopedic Surgery

## 2014-01-12 NOTE — Telephone Encounter (Signed)
Call received from patient, requesting physical therapy for her left upper arm, "shoulder to elbow", due to pain and having difficulty raising her arm.  Patient's last visit here was 12/07/13, in which she was released, unless symptoms fail to improve.  She had recently seen her primary care, Dr. Moshe Cipro, and was referred to Dr. Mardelle Matte for her right arm and shoulder, as she had relayed that she was not able to get an immediate appointment here.  She states Dr. Mardelle Matte has ordered therapy at [facility]; therefore patient would like to do the left as well, and said Dr.Landau is fine with assisting Dr. Aline Brochure.  Patient said she wants to stay with Dr. Aline Brochure for her left arm and her carpel tunnel.   Please advise regarding therapy on the left arm.  Her ph# is [phone].

## 2014-01-13 NOTE — Telephone Encounter (Signed)
Tell her order has been placed   Call OT for appt

## 2014-01-17 ENCOUNTER — Ambulatory Visit (HOSPITAL_COMMUNITY)
Admission: RE | Admit: 2014-01-17 | Discharge: 2014-01-17 | Disposition: A | Discharge status: Home / Self Care | Payer: BC Managed Care – PPO | Source: Ambulatory Visit | Attending: Orthopedic Surgery | Admitting: Orthopedic Surgery

## 2014-01-17 DIAGNOSIS — M25619 Stiffness of unspecified shoulder, not elsewhere classified: Secondary | ICD-10-CM | POA: Insufficient documentation

## 2014-01-17 DIAGNOSIS — N189 Chronic kidney disease, unspecified: Secondary | ICD-10-CM | POA: Insufficient documentation

## 2014-01-17 DIAGNOSIS — IMO0001 Reserved for inherently not codable concepts without codable children: Secondary | ICD-10-CM | POA: Insufficient documentation

## 2014-01-17 DIAGNOSIS — Z794 Long term (current) use of insulin: Secondary | ICD-10-CM | POA: Insufficient documentation

## 2014-01-17 DIAGNOSIS — M25519 Pain in unspecified shoulder: Secondary | ICD-10-CM | POA: Insufficient documentation

## 2014-01-17 DIAGNOSIS — M6289 Other specified disorders of muscle: Secondary | ICD-10-CM

## 2014-01-17 DIAGNOSIS — M25611 Stiffness of right shoulder, not elsewhere classified: Secondary | ICD-10-CM | POA: Insufficient documentation

## 2014-01-17 DIAGNOSIS — E119 Type 2 diabetes mellitus without complications: Secondary | ICD-10-CM | POA: Insufficient documentation

## 2014-01-17 DIAGNOSIS — I129 Hypertensive chronic kidney disease with stage 1 through stage 4 chronic kidney disease, or unspecified chronic kidney disease: Secondary | ICD-10-CM | POA: Insufficient documentation

## 2014-01-17 DIAGNOSIS — M629 Disorder of muscle, unspecified: Secondary | ICD-10-CM

## 2014-01-17 NOTE — Telephone Encounter (Signed)
PLEASE CALL PT. IF HER WEIGHT IS DOWN TO 114 LBS SHE SHOULD ADD BOOST PUDDING 3 TIMES A DAY BETWEEN MEALS.

## 2014-01-17 NOTE — Evaluation (Signed)
Occupational Therapy Evaluation  Patient Details  Name: Ashley Armstrong MRN: [id number] Date of Birth: 11-28-1965  Today's Date: 01/17/2014 Time: 1020-1105 OT Time Calculation (min): 45 min Eval 92'  Visit#: 1 of 16  Re-eval: 02/14/14     Authorization: [employer] (Disability/Workers Comp Pending)  Authorization Time Period: n/a  Authorization Visit#: 1 of     Past Medical History:  Past Medical History  Diagnosis Date  . Nicotine addiction   . Anxiety   . IDDM (insulin dependent diabetes mellitus)   . Dyslipidemia   . Headache(784.0)   . TIA (transient ischemic attack)     Dr. Merlene Laughter  . Gastroparesis due to DM OCT 2014    75% AT 2 HRS, GLU >    Past Surgical History:  Past Surgical History  Procedure Laterality Date  . Laser surgery bilateral eye    . Esophagogastroduodenoscopy N/A 08/31/2013    Procedure: ESOPHAGOGASTRODUODENOSCOPY (EGD);  Surgeon: Danie Binder, MD;  Location: [facility];  Service: Endoscopy;  Laterality: N/A;  11:15    Subjective Symptoms/Limitations Symptoms: S: "its been going on since probably 2012... the pain just radiates down my arm." Pertinent History: Pt is 48 y/o with signifiacant medical history.  She is currently presenting for pain in her right shoulder, which is a fairly constant 7/10 but with moveemtn can reach 10/10. She has noticed popping sounds in her shoulder and decreased strength overall (her arm drops when she is trying to hold objects, a drink). She estimates approx 50% AROM in sitting.  Her right shulder has been causing pain since approximatley 2012, - she hasn't received treatment prior due to insurance and fincancial concerns.  Pt has previoulsy received treatment for a tear in her left rotator cuff and for left tennis elbow (pt states seh also has tennis elbow on the right).  Pt also has Type I diabetes, C5/C6 slipped/bulging disks, GI concerns, polyneuropathy, bilateral carpal tunnel syndrome, and an episode in  June that MDs are determining wheterh it ws a mini stroke or diabetic seizure.  Pt brings her diabetes supplies with her. Special Tests: FOTO 31/100 (69% limited) Patient Stated Goals: "Getting that arm working right - getting to brush my hair." Pain Assessment Currently in Pain?: Yes Pain Score: 7  Pain Location: Shoulder Pain Orientation: Right Pain Type: Chronic pain Pain Radiating Towards: ulnar wrist Pain Onset: More than a month ago Pain Frequency: Constant  Precautions/Restrictions  Precautions Precautions: None Restrictions Weight Bearing Restrictions: No  Balance Screening Balance Screen Has the patient fallen in the past 6 months: No  Assessment  01/17/14 1300  Assessment  Diagnosis Right Shoulder Impingement Syndrome  Surgical Date (n/a)  Next MD Visit April 13th?  Prior Therapy for Left shoulder, for left elbow  Precautions  Precautions None  Restrictions  Weight Bearing Restrictions No  Balance Screen  Has the patient fallen in the past 6 months No  Home Living  Family/patient expects to be discharged to: Private residence  Living Arrangements Non-relatives/Friends  Available Help at Discharge Friend(s);Family  Prior Function  Level of Independence Independent with basic ADLs;Needs assistance with homemaking (mom, roommate assist with IADLs)  Driving Yes  Vocation On disability (Pending - was working in [employer] for 15 years)  Biomedical scientist reaching overhead, tying knots, using scissors, lifting 3-25lbs overhead, pulling  Leisure [employer] (Comment)  [place], sports (all, but played basketball)  ADL  ADL Comments Pt cannot currenlty wash her hair or her back. Dressing, donning a shirt  or bra, is very difficult. Driving can be difficult, especially when turning or changing gears. Pt does only some laundry in terms of housework - IADL level has significanly decresed since pain begain increasing in 2012. Pt has difficulty opening jars or  holding onto drinking glasses without arm dropping.  Vision - History  Baseline Vision Other (comment) (Constantly changes, receives shots for diabetic retinopathy)  Visual History Retinopathy  Cognition  Overall Cognitive Status Within Functional Limits for tasks assessed  Arousal/Alertness Awake/alert  Orientation Level Oriented X4  Observation/Other Assessments  Observations During PROM/AROm measurement in supine, pt had episode of being unable to relax arm after internal rotation measurements. Pt reports that these spasms are not unusual.  Sensation  Light Touch Appears Intact  Additional Comments Pt reports polyneuropathy in bilateral arms, but light touch appeared in tact.  Coordination  Gross Motor Movements are Fluid and Coordinated Yes  Fine Motor Movements are Fluid and Coordinated No (Slow and difficult, bilaterally)  Edema  Edema none noted`  RUE AROM (degrees)  RUE Overall AROM Comments Assessed in supine, with ER/IR adducted  Right Shoulder Flexion 121 Degrees  Right Shoulder ABduction 113 Degrees  Right Shoulder Internal Rotation 68 Degrees  Right Shoulder External Rotation 21 Degrees  Right Elbow Flexion 143  Right Elbow Extension (WFL)  RUE PROM (degrees)  RUE Overall PROM Comments Assess in supine, with ER/IR adducted  Right Shoulder Flexion 154 Degrees  Right Shoulder ABduction 134 Degrees  Right Shoulder Internal Rotation 90 Degrees  Right Shoulder External Rotation 30 Degrees  RUE Strength  Grip (lbs) 38  Right Shoulder Flexion 3-/5  Right Shoulder Internal Rotation 3-/5  Right Shoulder External Rotation 3-/5  Right Shoulder ABduction 3-/5  LUE AROM (degrees)  Left Shoulder Flexion (Approx 130 in supine)  LUE Overall AROM Comments Pt has decreased AROM in LUE - pt indicates she is requesting orders to continue therapy for left shoulder. Further assessment with MD order.  LUE Strength  Grip (lbs) 41  Palpation  Palpation Pt has max fascial  restrictions in right scapular region and moderaion in right bicep and anterior shoulder.  Written Expression  Dominant Hand Right    Occupational Therapy Assessment and Plan OT Assessment and Plan Clinical Impression Statement: A: Pt is presenting to outpatient OT with signifacnlty incresed pain, decreased PROM and AROM, increased fascial restrictions, decreased strength, and subsequently decreased ADL status and RUE functional use. Pt will benefit from skilled therapeutic intervention in order to improve on the following deficits: Decreased range of motion;Decreased strength;Impaired UE functional use;Pain;Increased fascial restricitons Rehab Potential: Good Clinical Impairments Affecting Rehab Potential: Complicated medical history OT Frequency: Min 2X/week OT Duration: 8 weeks OT Treatment/Interventions: Self-care/ADL training;Therapeutic exercise;Energy conservation;Manual therapy;Modalities;Therapeutic activities;Patient/family education OT Plan: Pt will benefit from skilled OT services to decrease pain, improve ROM, increase strength, decrease fascial restrictions, and improve overall  ADL status.  Treatment Plan: MFR, manual stretching, PROM, AAROM, AROM, e-stm and modalities as needed for pain relief, proximal strengthening, scapular stabilization   Goals Home Exercise Program Pt/caregiver will Perform Home Exercise Program: For increased ROM;For increased strengthening PT Goal: Perform Home Exercise Program - Progress: Goal set today Short Term Goals Time to Complete Short Term Goals: 4 weeks Short Term Goal 1: Pt will be educated on HEP Short Term Goal 2: Pt will attain right shoulder PROM to WNL in order to facilitate improved reaching towards her head for bathing. Short Term Goal 3: Pt will have pain of less than 4/10 in her  right shoulder while driving her car, 5/5 instances. Short Term Goal 4: Pt will decrease fascial restrictions to min-mod overall in right shoulder region to  facilitate pain-free movement. Long Term Goals Long Term Goal 1: Pt will achieve highest level of functioning with all ADL, IADL, and leisure tasks. Long Term Goal 2: Pt will achieve right shoulder AROM to WNL in order to don her bra. Long Term Goal 3: Pt will have right shoulder strength of at least grossly 4/5 in order to wash her back. Long Term Goal 4: Pt will have pain of less than 2/10 in her right shoulder while changing gears in her car, 5/5 instances of town (stop/go) driving trips. Long Term Goal 5: Pt will have trace fascial restrictions overall in right shoulder region to facilitate pain free movement Additional Long Term Goals?: Yes Long Term Goal 6: Pt will verbalize/demonstrate ability to brush her hair with complaint of 1/10 pain or less and minimla noted ROM compensatory techniques.  Problem List Patient Active Problem List   Diagnosis Date Noted  . Decreased range of motion of right shoulder 01/17/2014  . Tight fascia 01/17/2014  . Carpal tunnel syndrome 12/07/2013  . Neck pain 12/07/2013  . Degenerative disc disease, cervical 12/07/2013  . Acute cervical adenitis 11/15/2013  . Left serous otitis media 11/15/2013  . Acute pharyngitis 11/15/2013  . Dyspepsia 08/24/2013  . Thoracic back pain 07/31/2013  . Abdominal pain, epigastric 07/31/2013  . Routine general medical examination at a health care facility 07/31/2013  . PMB (postmenopausal bleeding) 07/29/2013  . CTS (carpal tunnel syndrome) 07/08/2013  . Polyneuropathy 07/08/2013  . HTN (hypertension), benign 07/04/2013  . Diabetic nephropathy 06/23/2013  . TIA (transient ischemic attack) 04/28/2013  . Chronic kidney disease 04/28/2013  . Pain in joint, shoulder region 04/26/2013  . Muscle weakness (generalized) 04/26/2013  . Tennis elbow syndrome 04/15/2013  . Rotator cuff syndrome of left shoulder 04/15/2013  . Unspecified vitamin D deficiency 03/20/2013  . Onychomycosis of toenail 03/20/2013  . Left arm pain  03/19/2013  . HEADACHE 11/30/2008  . DIABETES MELLITUS, WITH RENAL COMPLICATIONS 78/93/8101  . FATIGUE 10/24/2008  . Gastroparesis 06/16/2008  . Diabetes mellitus, insulin dependent (IDDM), uncontrolled 04/13/2008  . Other and unspecified hyperlipidemia 04/13/2008  . Generalized anxiety disorder 04/13/2008    End of Session Activity Tolerance: Patient tolerated treatment well General Behavior During Therapy: [facility] for tasks assessed/performed OT Plan of Care OT Home Exercise Plan: Towel slides for shoulder ROM OT Patient Instructions: Explained and demonstrated. Provided handout (scanned). Pt verbalized undserstanding. Consulted and Agree with Plan of Care: Patient  GO Functional Assessment Tool Used: FOTO 31/100  Bea Graff, [place], OTR/L [phone]  01/17/2014, 5:15 PM  Physician Documentation Your signature is required to indicate approval of the treatment plan as stated above.  Please sign and either send electronically or make a copy of this report for your files and return this physician signed original.  Please mark one 1.__approve of plan  2. ___approve of plan with the following conditions.   ______________________________                                                          _____________________ Physician Signature  Date  

## 2014-01-18 NOTE — Telephone Encounter (Signed)
Called and informed pt.  

## 2014-01-20 ENCOUNTER — Ambulatory Visit (HOSPITAL_COMMUNITY)
Admission: RE | Admit: 2014-01-20 | Discharge: 2014-01-20 | Disposition: A | Discharge status: Home / Self Care | Payer: BC Managed Care – PPO | Source: Ambulatory Visit | Attending: Family Medicine | Admitting: Family Medicine

## 2014-01-20 NOTE — Telephone Encounter (Signed)
SPOKE WITH Ashley Armstrong. PT DOWN TO 112 LBS WITH TROUBLE SLEEPING/ANXIETY. AGREE WITH REMERON. EXPLAINED PLAN TO Ashley Armstrong.

## 2014-01-20 NOTE — Progress Notes (Signed)
Occupational Therapy Treatment Patient Details  Name: Ashley Armstrong MRN: [id number] Date of Birth: Jun 21, 1966  Today's Date: 01/20/2014 Time: 4098-1191 OT Time Calculation (min): 38 min Manual [phone] (24') Therapeutic Exercises [phone] (22')  Visit#: 2 of 16  Re-eval: 02/14/14    Authorization: [employer] (Disability/Workers Comp Pending)  Authorization Time Period: n/a  Authorization Visit#: 2 of    Subjective Symptoms/Limitations Symptoms: S: "its ok - about the same. I'be been massaging it some at home, I felt the knots. I felt them in my left too - I want to make an apointment with Dr. Aline Brochure about that." Pain Assessment Currently in Pain?: Yes Pain Score: 5  Pain Location: Shoulder Pain Orientation: Right Pain Type: Chronic pain  Precautions/Restrictions     Exercise/Treatments Supine Protraction: PROM;10 reps;AAROM;5 reps Horizontal ABduction: PROM;10 reps;AAROM;5 reps External Rotation: PROM;10 reps;AAROM;5 reps Internal Rotation: PROM;10 reps;AAROM;5 reps Flexion: PROM;10 reps;AAROM;5 reps ABduction: PROM;10 reps;AAROM;5 reps   Manual Therapy Manual Therapy: Myofascial release Myofascial Release: MFR to right bicep, tricep, andterior shoulder, upper trap and scapular regions to decrease restrictions and increase pain free range of motion.  Occupational Therapy Assessment and Plan OT Assessment and Plan Clinical Impression Statement: A: Pt had pain with end range PROM this date, but tolerated well stretching and indicated that tightness improved with stretching. Initiated AAROM and pt tolerated well. OT Plan: P: continue PROM and AAROM. Add Ball stretches.   Goals Short Term Goals Short Term Goal 1: Pt will be educated on HEP Short Term Goal 1 Progress: Progressing toward goal Short Term Goal 2: Pt will attain right shoulder PROM to WNL in order to facilitate improved reaching towards her head for bathing. Short Term Goal 2 Progress:  Progressing toward goal Short Term Goal 3: Pt will have pain of less than 4/10 in her right shoulder while driving her car, 5/5 instances. Short Term Goal 3 Progress: Progressing toward goal Short Term Goal 4: Pt will decrease fascial restrictions to min-mod overall in right shoulder region to facilitate pain-free movement. Short Term Goal 4 Progress: Progressing toward goal Long Term Goals Long Term Goal 1: Pt will achieve highest level of functioning with all ADL, IADL, and leisure tasks. Long Term Goal 1 Progress: Progressing toward goal Long Term Goal 2: Pt will achieve right shoulder AROM to WNL in order to don her bra. Long Term Goal 2 Progress: Progressing toward goal Long Term Goal 3: Pt will have right shoulder strength of at least grossly 4/5 in order to wash her back. Long Term Goal 3 Progress: Progressing toward goal Long Term Goal 4: Pt will have pain of less than 2/10 in her right shoulder while changing gears in her car, 5/5 instances of town (stop/go) driving trips. Long Term Goal 4 Progress: Progressing toward goal Long Term Goal 5: Pt will have trace fascial restrictions overall in right shoulder region to facilitate pain free movement Long Term Goal 5 Progress: Progressing toward goal Additional Long Term Goals?: Yes Long Term Goal 6: Pt will verbalize/demonstrate ability to brush her hair with complaint of 1/10 pain or less and minimla noted ROM compensatory techniques. Long Term Goal 6 Progress: Progressing toward goal  Problem List Patient Active Problem List   Diagnosis Date Noted  . Decreased range of motion of right shoulder 01/17/2014  . Tight fascia 01/17/2014  . Carpal tunnel syndrome 12/07/2013  . Neck pain 12/07/2013  . Degenerative disc disease, cervical 12/07/2013  . Acute cervical adenitis 11/15/2013  . Left serous otitis  media 11/15/2013  . Acute pharyngitis 11/15/2013  . Dyspepsia 08/24/2013  . Thoracic back pain 07/31/2013  . Abdominal pain,  epigastric 07/31/2013  . Routine general medical examination at a health care facility 07/31/2013  . PMB (postmenopausal bleeding) 07/29/2013  . CTS (carpal tunnel syndrome) 07/08/2013  . Polyneuropathy 07/08/2013  . HTN (hypertension), benign 07/04/2013  . Diabetic nephropathy 06/23/2013  . TIA (transient ischemic attack) 04/28/2013  . Chronic kidney disease 04/28/2013  . Pain in joint, shoulder region 04/26/2013  . Muscle weakness (generalized) 04/26/2013  . Tennis elbow syndrome 04/15/2013  . Rotator cuff syndrome of left shoulder 04/15/2013  . Unspecified vitamin D deficiency 03/20/2013  . Onychomycosis of toenail 03/20/2013  . Left arm pain 03/19/2013  . HEADACHE 11/30/2008  . DIABETES MELLITUS, WITH RENAL COMPLICATIONS 25/95/6387  . FATIGUE 10/24/2008  . Gastroparesis 06/16/2008  . Diabetes mellitus, insulin dependent (IDDM), uncontrolled 04/13/2008  . Other and unspecified hyperlipidemia 04/13/2008  . Generalized anxiety disorder 04/13/2008    End of Session Activity Tolerance: Patient tolerated treatment well General Behavior During Therapy: [facility] for tasks assessed/performed  GO    Bea Graff, MS, OTR/L [phone]  01/20/2014, 12:17 PM

## 2014-01-20 NOTE — Telephone Encounter (Signed)
1350: Carly Enders with [facility] needs to speak to you regarding Mrs. Pintor and starting Remaron, she can be reached at [phone] Ext [phone]

## 2014-01-24 ENCOUNTER — Ambulatory Visit (HOSPITAL_COMMUNITY)
Admission: RE | Admit: 2014-01-24 | Discharge: 2014-01-24 | Disposition: A | Discharge status: Home / Self Care | Payer: BC Managed Care – PPO | Source: Ambulatory Visit | Attending: Family Medicine | Admitting: Family Medicine

## 2014-01-24 NOTE — Progress Notes (Signed)
Occupational Therapy Treatment Patient Details  Name: Ashley Armstrong MRN: [id number] Date of Birth: [birth year]  Today's Date: 01/24/2014 Time: 5188-4166 OT Time Calculation (min): 43 min Manual [phone] (24') Therapeutic Exercises 1000-1019 (19')  Visit#: 3 of 16  Re-eval: 02/14/14    Authorization: [employer] (Disability/Workers Comp Pending)  Authorization Time Period: n/a  Authorization Visit#: 3 of    Subjective Symptoms/Limitations Symptoms: S: Oh yeah - I feel a knot in there. I tried to throw a piece of paper away the other day and I couldn't." Pain Assessment Currently in Pain?: Yes Pain Score: 6  Pain Location: Shoulder Pain Orientation: Right Pain Type: Chronic pain  Precautions/Restrictions     Exercise/Treatments Supine Protraction: PROM;AAROM;10 reps Horizontal ABduction: PROM;AAROM;10 reps External Rotation: PROM;AAROM;10 reps Internal Rotation: PROM;AAROM;10 reps Flexion: PROM;AAROM;10 reps ABduction: PROM;AAROM;10 reps Therapy Ball Flexion: 15 reps ABduction: 15 reps Right/Left: 5 reps (each direction)    Manual Therapy Manual Therapy: Myofascial release Myofascial Release: MFR to right bicep, tricep, andterior shoulder, upper trap and scapular regions to decrease restrictions and increase pain free range of motion.    Occupational Therapy Assessment and Plan OT Assessment and Plan Clinical Impression Statement: A: Pt states having most difficulty with midrange lowering in flexion. Pt has most pain with IR and ER, with more in IR (adducted). Starting yesterday pt has had pain in right IP joints - tenderness. Pt's right hand colder than left this date. Pain down to 4/10 at end of session with stretching OT Plan: E-stim prn for pain. Continue with AAROM.   Goals Short Term Goals Short Term Goal 1: Pt will be educated on HEP Short Term Goal 1 Progress: Progressing toward goal Short Term Goal 2: Pt will attain right shoulder PROM to WNL in  order to facilitate improved reaching towards her head for bathing. Short Term Goal 2 Progress: Progressing toward goal Short Term Goal 3: Pt will have pain of less than 4/10 in her right shoulder while driving her car, 5/5 instances. Short Term Goal 3 Progress: Progressing toward goal Short Term Goal 4: Pt will decrease fascial restrictions to min-mod overall in right shoulder region to facilitate pain-free movement. Short Term Goal 4 Progress: Progressing toward goal Long Term Goals Long Term Goal 1: Pt will achieve highest level of functioning with all ADL, IADL, and leisure tasks. Long Term Goal 1 Progress: Progressing toward goal Long Term Goal 2: Pt will achieve right shoulder AROM to WNL in order to don her bra. Long Term Goal 2 Progress: Progressing toward goal Long Term Goal 3: Pt will have right shoulder strength of at least grossly 4/5 in order to wash her back. Long Term Goal 3 Progress: Progressing toward goal Long Term Goal 4: Pt will have pain of less than 2/10 in her right shoulder while changing gears in her car, 5/5 instances of town (stop/go) driving trips. Long Term Goal 4 Progress: Progressing toward goal Long Term Goal 5: Pt will have trace fascial restrictions overall in right shoulder region to facilitate pain free movement Long Term Goal 5 Progress: Progressing toward goal Additional Long Term Goals?: Yes Long Term Goal 6: Pt will verbalize/demonstrate ability to brush her hair with complaint of 1/10 pain or less and minimla noted ROM compensatory techniques. Long Term Goal 6 Progress: Progressing toward goal  Problem List Patient Active Problem List   Diagnosis Date Noted  . Decreased range of motion of right shoulder 01/17/2014  . Tight fascia 01/17/2014  . Carpal tunnel syndrome  12/07/2013  . Neck pain 12/07/2013  . Degenerative disc disease, cervical 12/07/2013  . Acute cervical adenitis 11/15/2013  . Left serous otitis media 11/15/2013  . Acute pharyngitis  11/15/2013  . Dyspepsia 08/24/2013  . Thoracic back pain 07/31/2013  . Abdominal pain, epigastric 07/31/2013  . Routine general medical examination at a health care facility 07/31/2013  . PMB (postmenopausal bleeding) 07/29/2013  . CTS (carpal tunnel syndrome) 07/08/2013  . Polyneuropathy 07/08/2013  . HTN (hypertension), benign 07/04/2013  . Diabetic nephropathy 06/23/2013  . TIA (transient ischemic attack) 04/28/2013  . Chronic kidney disease 04/28/2013  . Pain in joint, shoulder region 04/26/2013  . Muscle weakness (generalized) 04/26/2013  . Tennis elbow syndrome 04/15/2013  . Rotator cuff syndrome of left shoulder 04/15/2013  . Unspecified vitamin D deficiency 03/20/2013  . Onychomycosis of toenail 03/20/2013  . Left arm pain 03/19/2013  . HEADACHE 11/30/2008  . DIABETES MELLITUS, WITH RENAL COMPLICATIONS 86/57/8469  . FATIGUE 10/24/2008  . Gastroparesis 06/16/2008  . Diabetes mellitus, insulin dependent (IDDM), uncontrolled 04/13/2008  . Other and unspecified hyperlipidemia 04/13/2008  . Generalized anxiety disorder 04/13/2008    End of Session Activity Tolerance: Patient tolerated treatment well General Behavior During Therapy: [facility] for tasks assessed/performed  GO    Bea Graff, [place], OTR/L [phone]  01/24/2014, 12:22 PM

## 2014-01-27 ENCOUNTER — Telehealth (HOSPITAL_COMMUNITY): Payer: Self-pay

## 2014-01-27 ENCOUNTER — Inpatient Hospital Stay (HOSPITAL_COMMUNITY): Admission: RE | Admit: 2014-01-27 | Payer: BC Managed Care – PPO | Source: Ambulatory Visit

## 2014-01-28 ENCOUNTER — Telehealth: Payer: Self-pay | Admitting: Family Medicine

## 2014-01-28 NOTE — Telephone Encounter (Signed)
Patient is aware 

## 2014-01-31 ENCOUNTER — Ambulatory Visit (HOSPITAL_COMMUNITY)
Admission: RE | Admit: 2014-01-31 | Discharge: 2014-01-31 | Disposition: A | Discharge status: Home / Self Care | Payer: BC Managed Care – PPO | Source: Ambulatory Visit | Attending: Family Medicine | Admitting: Family Medicine

## 2014-01-31 DIAGNOSIS — M25519 Pain in unspecified shoulder: Secondary | ICD-10-CM

## 2014-01-31 DIAGNOSIS — M6281 Muscle weakness (generalized): Secondary | ICD-10-CM

## 2014-01-31 DIAGNOSIS — M25611 Stiffness of right shoulder, not elsewhere classified: Secondary | ICD-10-CM

## 2014-01-31 DIAGNOSIS — M6289 Other specified disorders of muscle: Secondary | ICD-10-CM

## 2014-01-31 DIAGNOSIS — M629 Disorder of muscle, unspecified: Secondary | ICD-10-CM

## 2014-01-31 NOTE — Progress Notes (Signed)
Occupational Therapy Treatment Patient Details  Name: Ashley Armstrong MRN: [id number] Date of Birth: [birth year]  Today's Date: 01/31/2014 Time: 4315-4008 OT Time Calculation (min): 44 min Manual therapy [phone] 24' Therapeutic exercises 1000-1020 20'  Visit#: 4 of 16  Re-eval: 02/14/14    Authorization: [employer] (Disability/Workers Comp Pending)   Subjective S:  It depends how I move it the degree it moves.  Pain Assessment Currently in Pain?: Yes Pain Score: 6  Pain Location: Shoulder Pain Orientation: Right Pain Type: Acute pain  Precautions/Restrictions   progress as tolerated  Exercise/Treatments Supine Protraction: PROM;10 reps;AAROM;15 reps Horizontal ABduction: PROM;10 reps;AAROM;15 reps External Rotation: PROM;10 reps;AAROM;15 reps Internal Rotation: PROM;10 reps;AAROM;15 reps Flexion: PROM;10 reps;AAROM;15 reps ABduction: PROM;10 reps;AAROM;15 reps Therapy Ball Flexion: 20 reps ABduction: 20 reps Right/Left: 5 reps     Manual Therapy Manual Therapy: Myofascial release Myofascial Release: MFR and manual stretching to right upper arm, shoulder, and scapular region to decrease pain and fascial restrictions and improve pain free mobility.  Manual cervical traction and MFR to cervical region.   Occupational Therapy Assessment and Plan OT Assessment and Plan Clinical Impression Statement: A:  Good form with AAROM in supine this date.  OT Plan: P:  transition to AROM in supine, add AAROM in standing.    Goals Short Term Goals Short Term Goal 1: Pt will be educated on HEP Short Term Goal 1 Progress: Progressing toward goal Short Term Goal 2: Pt will attain right shoulder PROM to WNL in order to facilitate improved reaching towards her head for bathing. Short Term Goal 2 Progress: Progressing toward goal Short Term Goal 3: Pt will have pain of less than 4/10 in her right shoulder while driving her car, 5/5 instances. Short Term Goal 3 Progress:  Progressing toward goal Short Term Goal 4: Pt will decrease fascial restrictions to min-mod overall in right shoulder region to facilitate pain-free movement. Short Term Goal 4 Progress: Progressing toward goal Long Term Goals Long Term Goal 1: Pt will achieve highest level of functioning with all ADL, IADL, and leisure tasks. Long Term Goal 1 Progress: Progressing toward goal Long Term Goal 2: Pt will achieve right shoulder AROM to WNL in order to don her bra. Long Term Goal 2 Progress: Progressing toward goal Long Term Goal 3: Pt will have right shoulder strength of at least grossly 4/5 in order to wash her back. Long Term Goal 3 Progress: Progressing toward goal Long Term Goal 4: Pt will have pain of less than 2/10 in her right shoulder while changing gears in her car, 5/5 instances of town (stop/go) driving trips. Long Term Goal 4 Progress: Progressing toward goal Long Term Goal 5: Pt will have trace fascial restrictions overall in right shoulder region to facilitate pain free movement Long Term Goal 5 Progress: Progressing toward goal Long Term Goal 6: Pt will verbalize/demonstrate ability to brush her hair with complaint of 1/10 pain or less and minimla noted ROM compensatory techniques. Long Term Goal 6 Progress: Progressing toward goal  Problem List Patient Active Problem List   Diagnosis Date Noted  . Decreased range of motion of right shoulder 01/17/2014  . Tight fascia 01/17/2014  . Carpal tunnel syndrome 12/07/2013  . Neck pain 12/07/2013  . Degenerative disc disease, cervical 12/07/2013  . Acute cervical adenitis 11/15/2013  . Left serous otitis media 11/15/2013  . Acute pharyngitis 11/15/2013  . Dyspepsia 08/24/2013  . Thoracic back pain 07/31/2013  . Abdominal pain, epigastric 07/31/2013  . Routine general  medical examination at a health care facility 07/31/2013  . PMB (postmenopausal bleeding) 07/29/2013  . CTS (carpal tunnel syndrome) 07/08/2013  . Polyneuropathy  07/08/2013  . HTN (hypertension), benign 07/04/2013  . Diabetic nephropathy 06/23/2013  . TIA (transient ischemic attack) 04/28/2013  . Chronic kidney disease 04/28/2013  . Pain in joint, shoulder region 04/26/2013  . Muscle weakness (generalized) 04/26/2013  . Tennis elbow syndrome 04/15/2013  . Rotator cuff syndrome of left shoulder 04/15/2013  . Unspecified vitamin D deficiency 03/20/2013  . Onychomycosis of toenail 03/20/2013  . Left arm pain 03/19/2013  . HEADACHE 11/30/2008  . DIABETES MELLITUS, WITH RENAL COMPLICATIONS 59/56/3875  . FATIGUE 10/24/2008  . Gastroparesis 06/16/2008  . Diabetes mellitus, insulin dependent (IDDM), uncontrolled 04/13/2008  . Other and unspecified hyperlipidemia 04/13/2008  . Generalized anxiety disorder 04/13/2008    End of Session Activity Tolerance: Patient tolerated treatment well General Behavior During Therapy: [facility] for tasks assessed/performed  GO    Vangie Bicker, OTR/L  01/31/2014, 10:23 AM

## 2014-02-03 ENCOUNTER — Ambulatory Visit (HOSPITAL_COMMUNITY)
Admission: RE | Admit: 2014-02-03 | Discharge: 2014-02-03 | Disposition: A | Discharge status: Home / Self Care | Payer: BC Managed Care – PPO | Source: Ambulatory Visit | Attending: Family Medicine | Admitting: Family Medicine

## 2014-02-03 DIAGNOSIS — M629 Disorder of muscle, unspecified: Secondary | ICD-10-CM

## 2014-02-03 DIAGNOSIS — M25611 Stiffness of right shoulder, not elsewhere classified: Secondary | ICD-10-CM

## 2014-02-03 DIAGNOSIS — M6281 Muscle weakness (generalized): Secondary | ICD-10-CM

## 2014-02-03 DIAGNOSIS — M25519 Pain in unspecified shoulder: Secondary | ICD-10-CM

## 2014-02-03 DIAGNOSIS — M6289 Other specified disorders of muscle: Secondary | ICD-10-CM

## 2014-02-03 NOTE — Progress Notes (Signed)
Occupational Therapy Treatment Patient Details  Name: Ashley Armstrong MRN: [id number] Date of Birth: Jan 04, 1966  Today's Date: 02/03/2014 Time: 8657-8469 OT Time Calculation (min): 41 min MFR [phone] 16' Therex [phone] 25'  Visit#: 5 of 16  Re-eval: 02/14/14    Authorization: [employer] (Disability/Workers Comp Pending)  Authorization Time Period:    Authorization Visit#: 5 of    Subjective Symptoms/Limitations Symptoms: S: I washed out my washer and dryer yesterday. I figured that would be good exercise. Today I'm hurting though.  Pain Assessment Currently in Pain?: Yes Pain Score: 6  Pain Location: Shoulder Pain Orientation: Right Pain Type: Acute pain  Precautions/Restrictions  Precautions Precautions: None  Exercise/Treatments Supine Protraction: PROM;AROM;10 reps Horizontal ABduction: PROM;AROM;10 reps External Rotation: PROM;AROM;10 reps Internal Rotation: PROM;AROM;10 reps Flexion: PROM;AROM;10 reps ABduction: PROM;AROM;10 reps Standing Protraction: AAROM;10 reps Horizontal ABduction: AAROM;10 reps External Rotation: AAROM;10 reps Internal Rotation: AAROM;10 reps Flexion: AAROM;10 reps ABduction: AAROM;10 reps Extension: AAROM;10 reps ROM / Strengthening / Isometric Strengthening Rhythmic Stabilization, Supine: 10X no rests        Manual Therapy Manual Therapy: Myofascial release Myofascial Release: MFR and manual stretching to right upper arm, shoulder, and scapular region to decrease pain and fascial restrictions and improve pain free mobility. Manual cervical traction and MFR to cervical region.  Occupational Therapy Assessment and Plan OT Assessment and Plan Clinical Impression Statement: A: Patient with good form this date with all exercises. Added AROM supine and AAROM standing. Patient tolerated well.  OT Plan: P: Cont with AAROM standing and progress to AROM standing. Add ball on the wall.     Goals Short Term Goals Short Term  Goal 1: Pt will be educated on HEP Short Term Goal 2: Pt will attain right shoulder PROM to WNL in order to facilitate improved reaching towards her head for bathing. Short Term Goal 3: Pt will have pain of less than 4/10 in her right shoulder while driving her car, 5/5 instances. Short Term Goal 4: Pt will decrease fascial restrictions to min-mod overall in right shoulder region to facilitate pain-free movement. Long Term Goals Long Term Goal 1: Pt will achieve highest level of functioning with all ADL, IADL, and leisure tasks. Long Term Goal 2: Pt will achieve right shoulder AROM to WNL in order to don her bra. Long Term Goal 3: Pt will have right shoulder strength of at least grossly 4/5 in order to wash her back. Long Term Goal 4: Pt will have pain of less than 2/10 in her right shoulder while changing gears in her car, 5/5 instances of town (stop/go) driving trips. Long Term Goal 5: Pt will have trace fascial restrictions overall in right shoulder region to facilitate pain free movement Long Term Goal 6: Pt will verbalize/demonstrate ability to brush her hair with complaint of 1/10 pain or less and minimla noted ROM compensatory techniques.  Problem List Patient Active Problem List   Diagnosis Date Noted  . Decreased range of motion of right shoulder 01/17/2014  . Tight fascia 01/17/2014  . Carpal tunnel syndrome 12/07/2013  . Neck pain 12/07/2013  . Degenerative disc disease, cervical 12/07/2013  . Acute cervical adenitis 11/15/2013  . Left serous otitis media 11/15/2013  . Acute pharyngitis 11/15/2013  . Dyspepsia 08/24/2013  . Thoracic back pain 07/31/2013  . Abdominal pain, epigastric 07/31/2013  . Routine general medical examination at a health care facility 07/31/2013  . PMB (postmenopausal bleeding) 07/29/2013  . CTS (carpal tunnel syndrome) 07/08/2013  . Polyneuropathy 07/08/2013  .  HTN (hypertension), benign 07/04/2013  . Diabetic nephropathy 06/23/2013  . TIA (transient  ischemic attack) 04/28/2013  . Chronic kidney disease 04/28/2013  . Pain in joint, shoulder region 04/26/2013  . Muscle weakness (generalized) 04/26/2013  . Tennis elbow syndrome 04/15/2013  . Rotator cuff syndrome of left shoulder 04/15/2013  . Unspecified vitamin D deficiency 03/20/2013  . Onychomycosis of toenail 03/20/2013  . Left arm pain 03/19/2013  . HEADACHE 11/30/2008  . DIABETES MELLITUS, WITH RENAL COMPLICATIONS 00/86/7619  . FATIGUE 10/24/2008  . Gastroparesis 06/16/2008  . Diabetes mellitus, insulin dependent (IDDM), uncontrolled 04/13/2008  . Other and unspecified hyperlipidemia 04/13/2008  . Generalized anxiety disorder 04/13/2008    End of Session Activity Tolerance: Patient tolerated treatment well General Behavior During Therapy: [facility] for tasks assessed/performed   Ailene Ravel, OTR/L,CBIS   02/03/2014, 10:50 AM

## 2014-02-07 ENCOUNTER — Ambulatory Visit (HOSPITAL_COMMUNITY)
Admission: RE | Admit: 2014-02-07 | Discharge: 2014-02-07 | Disposition: A | Discharge status: Home / Self Care | Payer: BC Managed Care – PPO | Source: Ambulatory Visit | Attending: Family Medicine | Admitting: Family Medicine

## 2014-02-07 NOTE — Progress Notes (Signed)
Occupational Therapy Treatment Patient Details  Name: Ashley Armstrong MRN: [id number] Date of Birth: 1966/05/21  Today's Date: 02/07/2014 Time: 0935-1020 OT Time Calculation (min): 45 min Manual [phone] (20') Therapeutic Exercises [phone] (25')  Visit#: 6 of 16  Re-eval: 02/14/14    Authorization: [employer] (Disability/Workers Comp Pending)  Authorization Time Period: n/a  Authorization Visit#: 6 of    Subjective Symptoms/Limitations Symptoms: "Its been a long time since I slept that good!" Pain Assessment Currently in Pain?: Yes Pain Score: 4  Pain Location: Shoulder Pain Orientation: Right  Precautions/Restrictions     Exercise/Treatments Supine Protraction: PROM;10 reps;AROM;12 reps Horizontal ABduction: PROM;10 reps;AROM;12 reps External Rotation: PROM;10 reps;AROM;12 reps Internal Rotation: PROM;10 reps;AROM;12 reps Flexion: PROM;10 reps;AROM;12 reps ABduction: PROM;10 reps;AROM;12 reps Standing Protraction: AAROM;10 reps Horizontal ABduction: AAROM;10 reps External Rotation: AAROM;10 reps Internal Rotation: AAROM;10 reps Flexion: AAROM;10 reps ABduction: AAROM;10 reps Extension: AAROM;10 reps     Manual Therapy Manual Therapy: Myofascial release Myofascial Release: MFR and manual stretching to right upper arm, shoulder, and scapular region to decrease pain and fascial restrictions and improve pain free mobility. Manual cervical traction and MFR to cervical region.    Occupational Therapy Assessment and Plan OT Assessment and Plan Clinical Impression Statement: Pt verbalized having increased success with AROM after MFR and PROM. Pt tolerated well AAROM in standing. OT Plan: P: Cont with AAROM standing and progress to AROM standing. Add ball on the wall.  Add HEP for AAROM with cane.   Goals Short Term Goals Short Term Goal 1: Pt will be educated on HEP Short Term Goal 1 Progress: Progressing toward goal Short Term Goal 2: Pt will attain right  shoulder PROM to WNL in order to facilitate improved reaching towards her head for bathing. Short Term Goal 2 Progress: Progressing toward goal Short Term Goal 3: Pt will have pain of less than 4/10 in her right shoulder while driving her car, 5/5 instances. Short Term Goal 3 Progress: Progressing toward goal Short Term Goal 4: Pt will decrease fascial restrictions to min-mod overall in right shoulder region to facilitate pain-free movement. Short Term Goal 4 Progress: Progressing toward goal Long Term Goals Long Term Goal 1: Pt will achieve highest level of functioning with all ADL, IADL, and leisure tasks. Long Term Goal 1 Progress: Progressing toward goal Long Term Goal 2: Pt will achieve right shoulder AROM to WNL in order to don her bra. Long Term Goal 2 Progress: Progressing toward goal Long Term Goal 3: Pt will have right shoulder strength of at least grossly 4/5 in order to wash her back. Long Term Goal 3 Progress: Progressing toward goal Long Term Goal 4: Pt will have pain of less than 2/10 in her right shoulder while changing gears in her car, 5/5 instances of town (stop/go) driving trips. Long Term Goal 4 Progress: Progressing toward goal Long Term Goal 5: Pt will have trace fascial restrictions overall in right shoulder region to facilitate pain free movement Long Term Goal 5 Progress: Progressing toward goal Long Term Goal 6: Pt will verbalize/demonstrate ability to brush her hair with complaint of 1/10 pain or less and minimla noted ROM compensatory techniques. Long Term Goal 6 Progress: Progressing toward goal  Problem List Patient Active Problem List   Diagnosis Date Noted  . Decreased range of motion of right shoulder 01/17/2014  . Tight fascia 01/17/2014  . Carpal tunnel syndrome 12/07/2013  . Neck pain 12/07/2013  . Degenerative disc disease, cervical 12/07/2013  . Acute cervical adenitis  11/15/2013  . Left serous otitis media 11/15/2013  . Acute pharyngitis  11/15/2013  . Dyspepsia 08/24/2013  . Thoracic back pain 07/31/2013  . Abdominal pain, epigastric 07/31/2013  . Routine general medical examination at a health care facility 07/31/2013  . PMB (postmenopausal bleeding) 07/29/2013  . CTS (carpal tunnel syndrome) 07/08/2013  . Polyneuropathy 07/08/2013  . HTN (hypertension), benign 07/04/2013  . Diabetic nephropathy 06/23/2013  . TIA (transient ischemic attack) 04/28/2013  . Chronic kidney disease 04/28/2013  . Pain in joint, shoulder region 04/26/2013  . Muscle weakness (generalized) 04/26/2013  . Tennis elbow syndrome 04/15/2013  . Rotator cuff syndrome of left shoulder 04/15/2013  . Unspecified vitamin D deficiency 03/20/2013  . Onychomycosis of toenail 03/20/2013  . Left arm pain 03/19/2013  . HEADACHE 11/30/2008  . DIABETES MELLITUS, WITH RENAL COMPLICATIONS 76/73/4193  . FATIGUE 10/24/2008  . Gastroparesis 06/16/2008  . Diabetes mellitus, insulin dependent (IDDM), uncontrolled 04/13/2008  . Other and unspecified hyperlipidemia 04/13/2008  . Generalized anxiety disorder 04/13/2008    End of Session Activity Tolerance: Patient tolerated treatment well General Behavior During Therapy: [facility] for tasks assessed/performed  GO   Bea Graff, MS, OTR/L [id number]  02/07/2014, 12:04 PM

## 2014-02-11 ENCOUNTER — Ambulatory Visit (HOSPITAL_COMMUNITY): Payer: BC Managed Care – PPO | Admitting: Specialist

## 2014-02-15 ENCOUNTER — Other Ambulatory Visit: Payer: Self-pay | Admitting: Family Medicine

## 2014-02-15 ENCOUNTER — Ambulatory Visit (HOSPITAL_COMMUNITY)
Admission: RE | Admit: 2014-02-15 | Discharge: 2014-02-15 | Disposition: A | Discharge status: Home / Self Care | Payer: BC Managed Care – PPO | Source: Ambulatory Visit | Attending: Orthopedic Surgery | Admitting: Orthopedic Surgery

## 2014-02-15 DIAGNOSIS — E119 Type 2 diabetes mellitus without complications: Secondary | ICD-10-CM | POA: Insufficient documentation

## 2014-02-15 DIAGNOSIS — M6281 Muscle weakness (generalized): Secondary | ICD-10-CM

## 2014-02-15 DIAGNOSIS — M25519 Pain in unspecified shoulder: Secondary | ICD-10-CM

## 2014-02-15 DIAGNOSIS — Z794 Long term (current) use of insulin: Secondary | ICD-10-CM | POA: Insufficient documentation

## 2014-02-15 DIAGNOSIS — I129 Hypertensive chronic kidney disease with stage 1 through stage 4 chronic kidney disease, or unspecified chronic kidney disease: Secondary | ICD-10-CM | POA: Insufficient documentation

## 2014-02-15 DIAGNOSIS — N189 Chronic kidney disease, unspecified: Secondary | ICD-10-CM | POA: Insufficient documentation

## 2014-02-15 DIAGNOSIS — M25611 Stiffness of right shoulder, not elsewhere classified: Secondary | ICD-10-CM

## 2014-02-15 DIAGNOSIS — IMO0001 Reserved for inherently not codable concepts without codable children: Secondary | ICD-10-CM | POA: Insufficient documentation

## 2014-02-15 DIAGNOSIS — M6289 Other specified disorders of muscle: Secondary | ICD-10-CM

## 2014-02-15 DIAGNOSIS — M629 Disorder of muscle, unspecified: Secondary | ICD-10-CM

## 2014-02-15 DIAGNOSIS — M25619 Stiffness of unspecified shoulder, not elsewhere classified: Secondary | ICD-10-CM | POA: Insufficient documentation

## 2014-02-15 NOTE — Evaluation (Signed)
Occupational Therapy Re- Evaluation  Patient Details  Name: Ashley Armstrong MRN: [id number] Date of Birth: 1966/01/02  Today's Date: 02/15/2014 Time: 8366-2947 OT Time Calculation (min): 37 min Manual therapy 6546-5035 24' ROM and MMT 1041-1103 22'  Visit#: 7 of 16  Re-eval: 03/15/14  Assessment Diagnosis: Right Shoulder Impingement Syndrome  Authorization: [employer] (Disability/Workers Comp Pending)  Authorization Time Period:    Authorization Visit#: 7 of     Past Medical History:  Past Medical History  Diagnosis Date  . Nicotine addiction   . Anxiety   . IDDM (insulin dependent diabetes mellitus)   . Dyslipidemia   . Headache(784.0)   . TIA (transient ischemic attack)     Dr. Merlene Laughter  . Gastroparesis due to DM OCT 2014    75% AT 2 HRS, GLU >    Past Surgical History:  Past Surgical History  Procedure Laterality Date  . Laser surgery bilateral eye    . Esophagogastroduodenoscopy N/A 08/31/2013    Procedure: ESOPHAGOGASTRODUODENOSCOPY (EGD);  Surgeon: Danie Binder, MD;  Location: [facility];  Service: Endoscopy;  Laterality: N/A;  11:15    Subjective S:  I can do things ok at waist height.  Its the mid range and above that's hard and combing hair  Special Tests: FOTO is 48.24/100 and was 31/100 on initial eval.  Pain Assessment Currently in Pain?: Yes Pain Score: 4  Pain Location: Shoulder Pain Orientation: Right Pain Type: Acute pain    Additional Assessments RUE AROM (degrees) RUE Overall AROM Comments: assessed in seated and supine ER/IR in adducted position (3/9 supine) Right Shoulder Flexion:  (130/140 (121)) Right Shoulder ABduction:  (110/100 (113)) Right Shoulder Internal Rotation:  (80/90 (68)) Right Shoulder External Rotation:  (20/32 (21)) RUE PROM (degrees) RUE Overall PROM Comments: assessed in supine ER/IR with shoulder adducted (initial evaluation)  Right Shoulder Flexion: 150 Degrees (154) Right Shoulder ABduction: 118  Degrees (110) Right Shoulder Internal Rotation: 90 Degrees (90) Right Shoulder External Rotation: 30 Degrees (30) RUE Strength Right Shoulder Flexion: 3+/5 (3-/5) Right Shoulder ABduction: 3+/5 (3-/5) Right Shoulder Internal Rotation: 3+/5 (3-/5) Right Shoulder External Rotation: 3+/5 (3-/5)     Exercise/Treatments Supine Protraction: PROM;10 reps Horizontal ABduction: PROM;10 reps External Rotation: PROM;10 reps Internal Rotation: PROM;10 reps Flexion: PROM;10 reps ABduction: PROM;10 reps       Occupational Therapy Assessment and Plan OT Assessment and Plan Clinical Impression Statement: A: Reassessment completed this date, has made improvements in AROM and strength. Activities at waist height have improved, however is still having difficulties with activities above shoulder height. Has met 2/4 short term goals.  OT Plan: P:  Attempt AROM in standing, add ball on the wall.   Goals Short Term Goals Short Term Goal 1: Pt will be educated on HEP Short Term Goal 1 Progress: Met Short Term Goal 2: Pt will attain right shoulder PROM to WNL in order to facilitate improved reaching towards her head for bathing. Short Term Goal 2 Progress: Progressing toward goal Short Term Goal 3: Pt will have pain of less than 4/10 in her right shoulder while driving her car, 5/5 instances. Short Term Goal 3 Progress: Progressing toward goal Short Term Goal 4: Pt will decrease fascial restrictions to min-mod overall in right shoulder region to facilitate pain-free movement. Short Term Goal 4 Progress: Met Long Term Goals Long Term Goal 1: Pt will achieve highest level of functioning with all ADL, IADL, and leisure tasks. Long Term Goal 1 Progress: Progressing toward goal  Long Term Goal 2: Pt will achieve right shoulder AROM to WNL in order to don her bra. Long Term Goal 2 Progress: Progressing toward goal Long Term Goal 3: Pt will have right shoulder strength of at least grossly 4/5 in order to  wash her back. Long Term Goal 3 Progress: Progressing toward goal Long Term Goal 4: Pt will have pain of less than 2/10 in her right shoulder while changing gears in her car, 5/5 instances of town (stop/go) driving trips. Long Term Goal 4 Progress: Progressing toward goal Long Term Goal 5: Pt will have trace fascial restrictions overall in right shoulder region to facilitate pain free movement Long Term Goal 5 Progress: Progressing toward goal Long Term Goal 6: Pt will verbalize/demonstrate ability to brush her hair with complaint of 1/10 pain or less and minimla noted ROM compensatory techniques. Long Term Goal 6 Progress: Progressing toward goal  Problem List Patient Active Problem List   Diagnosis Date Noted  . Decreased range of motion of right shoulder 01/17/2014  . Tight fascia 01/17/2014  . Carpal tunnel syndrome 12/07/2013  . Neck pain 12/07/2013  . Degenerative disc disease, cervical 12/07/2013  . Acute cervical adenitis 11/15/2013  . Left serous otitis media 11/15/2013  . Acute pharyngitis 11/15/2013  . Dyspepsia 08/24/2013  . Thoracic back pain 07/31/2013  . Abdominal pain, epigastric 07/31/2013  . Routine general medical examination at a health care facility 07/31/2013  . PMB (postmenopausal bleeding) 07/29/2013  . CTS (carpal tunnel syndrome) 07/08/2013  . Polyneuropathy 07/08/2013  . HTN (hypertension), benign 07/04/2013  . Diabetic nephropathy 06/23/2013  . TIA (transient ischemic attack) 04/28/2013  . Chronic kidney disease 04/28/2013  . Pain in joint, shoulder region 04/26/2013  . Muscle weakness (generalized) 04/26/2013  . Tennis elbow syndrome 04/15/2013  . Rotator cuff syndrome of left shoulder 04/15/2013  . Unspecified vitamin D deficiency 03/20/2013  . Onychomycosis of toenail 03/20/2013  . Left arm pain 03/19/2013  . HEADACHE 11/30/2008  . DIABETES MELLITUS, WITH RENAL COMPLICATIONS 34/74/2595  . FATIGUE 10/24/2008  . Gastroparesis 06/16/2008  .  Diabetes mellitus, insulin dependent (IDDM), uncontrolled 04/13/2008  . Other and unspecified hyperlipidemia 04/13/2008  . Generalized anxiety disorder 04/13/2008       GO Functional Assessment Tool Used: FOTO 48.24  Vangie Bicker, OTR/L  02/15/2014, 4:18 PM  Physician Documentation Your signature is required to indicate approval of the treatment plan as stated above.  Please sign and either send electronically or make a copy of this report for your files and return this physician signed original.  Please mark one 1.__approve of plan  2. ___approve of plan with the following conditions.   ______________________________                                                          _____________________ Physician Signature  Date  

## 2014-02-17 ENCOUNTER — Ambulatory Visit (HOSPITAL_COMMUNITY)
Admission: RE | Admit: 2014-02-17 | Discharge: 2014-02-17 | Disposition: A | Discharge status: Home / Self Care | Payer: BC Managed Care – PPO | Source: Ambulatory Visit | Attending: Family Medicine | Admitting: Family Medicine

## 2014-02-17 DIAGNOSIS — M6289 Other specified disorders of muscle: Secondary | ICD-10-CM

## 2014-02-17 DIAGNOSIS — M629 Disorder of muscle, unspecified: Secondary | ICD-10-CM

## 2014-02-17 DIAGNOSIS — M25519 Pain in unspecified shoulder: Secondary | ICD-10-CM

## 2014-02-17 DIAGNOSIS — M6281 Muscle weakness (generalized): Secondary | ICD-10-CM

## 2014-02-17 DIAGNOSIS — M25611 Stiffness of right shoulder, not elsewhere classified: Secondary | ICD-10-CM

## 2014-02-17 NOTE — Progress Notes (Signed)
Occupational Therapy Treatment Patient Details  Name: Ashley Armstrong MRN: [id number] Date of Birth: 1966/10/27  Today's Date: 02/17/2014 Time: 4854-6270 OT Time Calculation (min): 42 min Manual therapy 350-093 11' Therapeutic exercises 906-936 30'  Visit#: 8 of 16  Re-eval: 03/15/14    Authorization: [employer] (Disability/Workers Comp Pending)   Authorization Visit#: 8 of    Subjective  S:  I scheduled some more appointments. Pain Assessment Currently in Pain?: Yes Pain Score: 3  Pain Location: Shoulder Pain Orientation: Right Pain Type: Acute pain  Precautions/Restrictions   progress as tolerated  Exercise/Treatments Supine Protraction: PROM;10 reps;AROM;15 reps Horizontal ABduction: PROM;10 reps;AROM;15 reps External Rotation: PROM;10 reps;AROM;15 reps Internal Rotation: PROM;10 reps;AROM;15 reps Flexion: PROM;10 reps;AROM;15 reps ABduction: PROM;10 reps;AROM;15 reps Standing Protraction: AROM;10 reps Horizontal ABduction: AROM;10 reps External Rotation: AROM;10 reps Internal Rotation: AROM;10 reps Flexion: AROM;10 reps ABduction: AROM;10 reps Extension: Theraband;15 reps Theraband Level (Shoulder Extension): Level 2 (Red) Row: Theraband;15 reps Theraband Level (Shoulder Row): Level 2 (Red) Retraction: Theraband;15 reps Theraband Level (Shoulder Retraction): Level 2 (Red)   ROM / Strengthening / Isometric Strengthening UBE (Upper Arm Bike): 3' and 3' at 1.0 Wall Wash: 2' Proximal Shoulder Strengthening, Supine: 10 times each without rest Proximal Shoulder Strengthening, Seated: 10 times each without resting Ball on Wall: 1' with arm flexed to 90 and 1' with arm abducted to 90        Manual Therapy Manual Therapy: Myofascial release Myofascial Release: Myofascial release (MFR) and manual stretching to right upper arm, shoulder, and scapular region to decrease pain and fascial restrictions and improve pain free mobility. Manual cervical traction  and MFR to cervical region.   Occupational Therapy Assessment and Plan OT Assessment and Plan Clinical Impression Statement: A:  Patient demonstrates good form with standing AROM, and theraband exercises, no vg required for depressing shoulder.  OT Plan: P:  Attempt 1# strengthening in supine and add prone scapular stability exercises.   Goals Short Term Goals Short Term Goal 1: Pt will be educated on HEP Short Term Goal 2: Pt will attain right shoulder PROM to WNL in order to facilitate improved reaching towards her head for bathing. Short Term Goal 3: Pt will have pain of less than 4/10 in her right shoulder while driving her car, 5/5 instances. Short Term Goal 4: Pt will decrease fascial restrictions to min-mod overall in right shoulder region to facilitate pain-free movement. Long Term Goals Long Term Goal 1: Pt will achieve highest level of functioning with all ADL, IADL, and leisure tasks. Long Term Goal 2: Pt will achieve right shoulder AROM to WNL in order to don her bra. Long Term Goal 3: Pt will have right shoulder strength of at least grossly 4/5 in order to wash her back. Long Term Goal 4: Pt will have pain of less than 2/10 in her right shoulder while changing gears in her car, 5/5 instances of town (stop/go) driving trips. Long Term Goal 5: Pt will have trace fascial restrictions overall in right shoulder region to facilitate pain free movement Long Term Goal 6: Pt will verbalize/demonstrate ability to brush her hair with complaint of 1/10 pain or less and minimla noted ROM compensatory techniques.  Problem List Patient Active Problem List   Diagnosis Date Noted  . Decreased range of motion of right shoulder 01/17/2014  . Tight fascia 01/17/2014  . Carpal tunnel syndrome 12/07/2013  . Neck pain 12/07/2013  . Degenerative disc disease, cervical 12/07/2013  . Acute cervical adenitis 11/15/2013  .  Left serous otitis media 11/15/2013  . Acute pharyngitis 11/15/2013  .  Dyspepsia 08/24/2013  . Thoracic back pain 07/31/2013  . Abdominal pain, epigastric 07/31/2013  . Routine general medical examination at a health care facility 07/31/2013  . PMB (postmenopausal bleeding) 07/29/2013  . CTS (carpal tunnel syndrome) 07/08/2013  . Polyneuropathy 07/08/2013  . HTN (hypertension), benign 07/04/2013  . Diabetic nephropathy 06/23/2013  . TIA (transient ischemic attack) 04/28/2013  . Chronic kidney disease 04/28/2013  . Pain in joint, shoulder region 04/26/2013  . Muscle weakness (generalized) 04/26/2013  . Tennis elbow syndrome 04/15/2013  . Rotator cuff syndrome of left shoulder 04/15/2013  . Unspecified vitamin D deficiency 03/20/2013  . Onychomycosis of toenail 03/20/2013  . Left arm pain 03/19/2013  . HEADACHE 11/30/2008  . DIABETES MELLITUS, WITH RENAL COMPLICATIONS 70/26/3785  . FATIGUE 10/24/2008  . Gastroparesis 06/16/2008  . Diabetes mellitus, insulin dependent (IDDM), uncontrolled 04/13/2008  . Other and unspecified hyperlipidemia 04/13/2008  . Generalized anxiety disorder 04/13/2008    End of Session Activity Tolerance: Patient tolerated treatment well General Behavior During Therapy: [facility] for tasks assessed/performed  GO    Vangie Bicker, OTR/L  02/17/2014, 9:33 AM

## 2014-02-22 ENCOUNTER — Other Ambulatory Visit: Payer: Self-pay | Admitting: Orthopedic Surgery

## 2014-02-22 DIAGNOSIS — M25519 Pain in unspecified shoulder: Secondary | ICD-10-CM

## 2014-02-23 ENCOUNTER — Ambulatory Visit (HOSPITAL_COMMUNITY)
Admission: RE | Admit: 2014-02-23 | Discharge: 2014-02-23 | Disposition: A | Discharge status: Home / Self Care | Payer: BC Managed Care – PPO | Source: Ambulatory Visit | Attending: Family Medicine | Admitting: Family Medicine

## 2014-02-23 DIAGNOSIS — M629 Disorder of muscle, unspecified: Secondary | ICD-10-CM

## 2014-02-23 DIAGNOSIS — M25519 Pain in unspecified shoulder: Secondary | ICD-10-CM

## 2014-02-23 DIAGNOSIS — M6281 Muscle weakness (generalized): Secondary | ICD-10-CM

## 2014-02-23 DIAGNOSIS — M6289 Other specified disorders of muscle: Secondary | ICD-10-CM

## 2014-02-23 DIAGNOSIS — M25611 Stiffness of right shoulder, not elsewhere classified: Secondary | ICD-10-CM

## 2014-02-23 NOTE — Progress Notes (Signed)
Occupational Therapy Treatment Patient Details  Name: Ashley Armstrong MRN: [id number] Date of Birth: 1966-03-04  Today's Date: 02/23/2014 Time: 0630-1601 OT Time Calculation (min): 43 min Manual therapy 093-235 57' Therapeutic exercises 322-025 26'  Visit#: 9 of 16  Re-eval: 03/15/14    Authorization: [employer] (Disability/Workers Comp Pending)  Authorization Time Period:    Authorization Visit#: 9 of    Subjective S:  I have a MRI scheduled on Sunday.   Pain Assessment Currently in Pain?: Yes Pain Score: 3  Pain Location: Shoulder Pain Orientation: Right Pain Type: Acute pain  Precautions/Restrictions   progress as tolerated  Exercise/Treatments Supine Protraction: PROM;Strengthening;10 reps Protraction Weight (lbs): 1 Horizontal ABduction: PROM;Strengthening;10 reps Horizontal ABduction Weight (lbs): 1 External Rotation: PROM;Strengthening;10 reps External Rotation Weight (lbs): 1 Internal Rotation: PROM;Strengthening;10 reps Internal Rotation Weight (lbs): 1 Flexion: PROM;Strengthening;10 reps Shoulder Flexion Weight (lbs): 1 ABduction: PROM;Strengthening;10 reps Shoulder ABduction Weight (lbs): 1  Protraction: Strengthening;10 reps Protraction Weight (lbs): 1 Horizontal ABduction: Strengthening;10 reps Horizontal ABduction Weight (lbs): 1 External Rotation: Strengthening;10 reps External Rotation Weight (lbs): 1 Internal Rotation: Strengthening;10 reps Internal Rotation Weight (lbs): 1 Flexion: Strengthening;10 reps Shoulder Flexion Weight (lbs): 1 ABduction: AROM;15 reps Shoulder ABduction Weight (lbs): 1 ROM / Strengthening / Isometric Strengthening UBE (Upper Arm Bike): 3' and 3' at 1.0 Wall Wash: 3' Proximal Shoulder Strengthening, Supine: 10 times each with 1#      Manual Therapy Manual Therapy: Myofascial release Myofascial Release: Myofascial release (MFR) and manual stretching to right upper arm, shoulder, and scapular region to  decrease pain and fascial restrictions and improve pain free mobility. Manual cervical traction and MFR to cervical region.   Occupational Therapy Assessment and Plan OT Assessment and Plan Clinical Impression Statement: A:  Transitoned to strengthening in supine and standing.  Unable to complete abduction with weight this date in standing.  did not complete prone exercises, per patient request. OT Plan: P:  attempt abduction strengthening in standing.    Goals Short Term Goals Short Term Goal 1: Pt will be educated on HEP Short Term Goal 2: Pt will attain right shoulder PROM to WNL in order to facilitate improved reaching towards her head for bathing. Short Term Goal 2 Progress: Progressing toward goal Short Term Goal 3: Pt will have pain of less than 4/10 in her right shoulder while driving her car, 5/5 instances. Short Term Goal 3 Progress: Progressing toward goal Short Term Goal 4: Pt will decrease fascial restrictions to min-mod overall in right shoulder region to facilitate pain-free movement. Long Term Goals Long Term Goal 1: Pt will achieve highest level of functioning with all ADL, IADL, and leisure tasks. Long Term Goal 1 Progress: Progressing toward goal Long Term Goal 2: Pt will achieve right shoulder AROM to WNL in order to don her bra. Long Term Goal 2 Progress: Progressing toward goal Long Term Goal 3: Pt will have right shoulder strength of at least grossly 4/5 in order to wash her back. Long Term Goal 3 Progress: Progressing toward goal Long Term Goal 4: Pt will have pain of less than 2/10 in her right shoulder while changing gears in her car, 5/5 instances of town (stop/go) driving trips. Long Term Goal 4 Progress: Progressing toward goal Long Term Goal 5: Pt will have trace fascial restrictions overall in right shoulder region to facilitate pain free movement Long Term Goal 5 Progress: Progressing toward goal Long Term Goal 6: Pt will verbalize/demonstrate ability to  brush her hair with complaint of  1/10 pain or less and minimla noted ROM compensatory techniques. Long Term Goal 6 Progress: Progressing toward goal  Problem List Patient Active Problem List   Diagnosis Date Noted  . Decreased range of motion of right shoulder 01/17/2014  . Tight fascia 01/17/2014  . Carpal tunnel syndrome 12/07/2013  . Neck pain 12/07/2013  . Degenerative disc disease, cervical 12/07/2013  . Acute cervical adenitis 11/15/2013  . Left serous otitis media 11/15/2013  . Acute pharyngitis 11/15/2013  . Dyspepsia 08/24/2013  . Thoracic back pain 07/31/2013  . Abdominal pain, epigastric 07/31/2013  . Routine general medical examination at a health care facility 07/31/2013  . PMB (postmenopausal bleeding) 07/29/2013  . CTS (carpal tunnel syndrome) 07/08/2013  . Polyneuropathy 07/08/2013  . HTN (hypertension), benign 07/04/2013  . Diabetic nephropathy 06/23/2013  . TIA (transient ischemic attack) 04/28/2013  . Chronic kidney disease 04/28/2013  . Pain in joint, shoulder region 04/26/2013  . Muscle weakness (generalized) 04/26/2013  . Tennis elbow syndrome 04/15/2013  . Rotator cuff syndrome of left shoulder 04/15/2013  . Unspecified vitamin D deficiency 03/20/2013  . Onychomycosis of toenail 03/20/2013  . Left arm pain 03/19/2013  . HEADACHE 11/30/2008  . DIABETES MELLITUS, WITH RENAL COMPLICATIONS 98/26/4158  . FATIGUE 10/24/2008  . Gastroparesis 06/16/2008  . Diabetes mellitus, insulin dependent (IDDM), uncontrolled 04/13/2008  . Other and unspecified hyperlipidemia 04/13/2008  . Generalized anxiety disorder 04/13/2008    End of Session Activity Tolerance: Patient tolerated treatment well General Behavior During Therapy: [facility] for tasks assessed/performed  [place], OTR/L 02/23/2014, 9:37 AM

## 2014-02-27 ENCOUNTER — Ambulatory Visit
Admission: RE | Admit: 2014-02-27 | Discharge: 2014-02-27 | Disposition: A | Discharge status: Home / Self Care | Payer: BC Managed Care – PPO | Source: Ambulatory Visit | Attending: Orthopedic Surgery | Admitting: Orthopedic Surgery

## 2014-02-27 DIAGNOSIS — M25519 Pain in unspecified shoulder: Secondary | ICD-10-CM

## 2014-03-01 ENCOUNTER — Ambulatory Visit (HOSPITAL_COMMUNITY)
Admission: RE | Admit: 2014-03-01 | Discharge: 2014-03-01 | Disposition: A | Discharge status: Home / Self Care | Payer: BC Managed Care – PPO | Source: Ambulatory Visit | Attending: Family Medicine | Admitting: Family Medicine

## 2014-03-01 DIAGNOSIS — M6281 Muscle weakness (generalized): Secondary | ICD-10-CM

## 2014-03-01 DIAGNOSIS — M25611 Stiffness of right shoulder, not elsewhere classified: Secondary | ICD-10-CM

## 2014-03-01 DIAGNOSIS — M25519 Pain in unspecified shoulder: Secondary | ICD-10-CM

## 2014-03-01 DIAGNOSIS — M6289 Other specified disorders of muscle: Secondary | ICD-10-CM

## 2014-03-01 DIAGNOSIS — M629 Disorder of muscle, unspecified: Secondary | ICD-10-CM

## 2014-03-01 NOTE — Progress Notes (Addendum)
Occupational Therapy Treatment Patient Details  Name: Ashley Armstrong MRN: [id number] Date of Birth: [birth year]  Today's Date: 03/01/2014 Time: 3086-5784 OT Time Calculation (min): 46 min MFR 696-295 15' Therex 284-132 31'  Visit#: 58 of 16  Re-eval: 03/15/14    Authorization: [employer] (Disability/Workers Comp Pending)  Authorization Time Period:    Authorization Visit#: 10 of    Subjective Symptoms/Limitations Symptoms: S: My neck and shoulder down to my elbow hurts today.  Pain Assessment Currently in Pain?: Yes Pain Score: 5  Pain Location: Shoulder (neck) Pain Orientation: Right Pain Type: Acute pain  Precautions/Restrictions  Precautions Precautions: None  Exercise/Treatments Supine Protraction: PROM;Strengthening;10 reps Protraction Weight (lbs): 1 Horizontal ABduction: PROM;Strengthening;10 reps Horizontal ABduction Weight (lbs): 1 External Rotation: PROM;Strengthening;10 reps External Rotation Weight (lbs): 1 Internal Rotation: PROM;Strengthening;10 reps Internal Rotation Weight (lbs): 1 Flexion: PROM;Strengthening;10 reps Shoulder Flexion Weight (lbs): 1 ABduction: PROM;Strengthening;10 reps Shoulder ABduction Weight (lbs): 1 Standing Protraction: Strengthening;10 reps Protraction Weight (lbs): 1 Flexion: Strengthening;10 reps Shoulder Flexion Weight (lbs): 1 ABduction: Strengthening;10 reps Shoulder ABduction Weight (lbs): 1 ROM / Strengthening / Isometric Strengthening UBE (Upper Arm Bike): 3' and 3' at 1.0 Proximal Shoulder Strengthening, Supine: 10 times each with 1#        Manual Therapy Manual Therapy: Myofascial release Myofascial Release: Myofascial release (MFR) and manual stretching to right upper arm, shoulder, and scapular region to decrease pain and fascial restrictions and improve pain free mobility. Manual cervical traction and MFR to cervical region.  Occupational Therapy Assessment and Plan OT Assessment and  Plan Clinical Impression Statement: A: Patient brought in MD order to work on Bilateral shoulder for adhesive capsulitis. Did not complete all exercises due to time constraint. LUE AROM: shoulder flexion: 130 (4/5), Abduction: 110 (4/5), IR: 90 (3+/5), ER: 15 (3+/5). OT Plan: P: Add left shoulder to plan of care. MFR and strengthening exercises for BUE.    Goals Short Term Goals Short Term Goal 1: Pt will be educated on HEP Short Term Goal 2: Pt will attain right shoulder PROM to WNL in order to facilitate improved reaching towards her head for bathing. Short Term Goal 2 Progress: Progressing toward goal Short Term Goal 3: Pt will have pain of less than 4/10 in her right shoulder while driving her car, 5/5 instances. Short Term Goal 3 Progress: Progressing toward goal Short Term Goal 4: Pt will decrease fascial restrictions to min-mod overall in right shoulder region to facilitate pain-free movement. Short Term Goal 5: Patient will attain left shoulder PROM to WNL in order to facilitate improved reaching towards her head for bathing. *New Goal* Short Term Goal 5 Progress: Progressing toward goal Long Term Goals Long Term Goal 1: Pt will achieve highest level of functioning with all ADL, IADL, and leisure tasks. Long Term Goal 1 Progress: Progressing toward goal Long Term Goal 2: Pt will achieve right shoulder AROM to WNL in order to don her bra. Long Term Goal 2 Progress: Progressing toward goal Long Term Goal 3: Pt will have right shoulder strength of at least grossly 4/5 in order to wash her back. Long Term Goal 3 Progress: Progressing toward goal Long Term Goal 4: Pt will have pain of less than 2/10 in her right shoulder while changing gears in her car, 5/5 instances of town (stop/go) driving trips. Long Term Goal 4 Progress: Progressing toward goal Long Term Goal 5: Pt will have trace fascial restrictions overall in right shoulder region to facilitate pain free movement Long Term Goal  5  Progress: Progressing toward goal Long Term Goal 6: Pt will verbalize/demonstrate ability to brush her hair with complaint of 1/10 pain or less and minimla noted ROM compensatory techniques. Long Term Goal 6 Progress: Progressing toward goal Long Term Goal 7: patient will achieve left shoulder AROM to WNL in order to don her bra. *New goal* Long Term Goal 7 Progress: Progressing toward goal  Problem List Patient Active Problem List   Diagnosis Date Noted  . Decreased range of motion of right shoulder 01/17/2014  . Tight fascia 01/17/2014  . Carpal tunnel syndrome 12/07/2013  . Neck pain 12/07/2013  . Degenerative disc disease, cervical 12/07/2013  . Acute cervical adenitis 11/15/2013  . Left serous otitis media 11/15/2013  . Acute pharyngitis 11/15/2013  . Dyspepsia 08/24/2013  . Thoracic back pain 07/31/2013  . Abdominal pain, epigastric 07/31/2013  . Routine general medical examination at a health care facility 07/31/2013  . PMB (postmenopausal bleeding) 07/29/2013  . CTS (carpal tunnel syndrome) 07/08/2013  . Polyneuropathy 07/08/2013  . HTN (hypertension), benign 07/04/2013  . Diabetic nephropathy 06/23/2013  . TIA (transient ischemic attack) 04/28/2013  . Chronic kidney disease 04/28/2013  . Pain in joint, shoulder region 04/26/2013  . Muscle weakness (generalized) 04/26/2013  . Tennis elbow syndrome 04/15/2013  . Rotator cuff syndrome of left shoulder 04/15/2013  . Unspecified vitamin D deficiency 03/20/2013  . Onychomycosis of toenail 03/20/2013  . Left arm pain 03/19/2013  . HEADACHE 11/30/2008  . DIABETES MELLITUS, WITH RENAL COMPLICATIONS 15/40/0867  . FATIGUE 10/24/2008  . Gastroparesis 06/16/2008  . Diabetes mellitus, insulin dependent (IDDM), uncontrolled 04/13/2008  . Other and unspecified hyperlipidemia 04/13/2008  . Generalized anxiety disorder 04/13/2008    End of Session Activity Tolerance: Patient tolerated treatment well General Behavior During  Therapy: [facility] for tasks assessed/performed   Ailene Ravel, OTR/L,CBIS   03/01/2014, 10:03 AM

## 2014-03-03 ENCOUNTER — Ambulatory Visit (HOSPITAL_COMMUNITY)
Admission: RE | Admit: 2014-03-03 | Discharge: 2014-03-03 | Disposition: A | Discharge status: Home / Self Care | Payer: BC Managed Care – PPO | Source: Ambulatory Visit | Attending: Family Medicine | Admitting: Family Medicine

## 2014-03-03 DIAGNOSIS — M6281 Muscle weakness (generalized): Secondary | ICD-10-CM

## 2014-03-03 DIAGNOSIS — M629 Disorder of muscle, unspecified: Secondary | ICD-10-CM

## 2014-03-03 DIAGNOSIS — M6289 Other specified disorders of muscle: Secondary | ICD-10-CM

## 2014-03-03 DIAGNOSIS — M25519 Pain in unspecified shoulder: Secondary | ICD-10-CM

## 2014-03-03 DIAGNOSIS — M25611 Stiffness of right shoulder, not elsewhere classified: Secondary | ICD-10-CM

## 2014-03-03 NOTE — Progress Notes (Signed)
Occupational Therapy Treatment Patient Details  Name: Ashley Armstrong MRN: [id number] Date of Birth: 1966/04/02  Today's Date: 03/03/2014 Time: 7510-2585 OT Time Calculation (min): 50 min MFR 850-920 30' Therex 920-940 46'  Visit#: 57 of 16  Re-eval: 03/15/14    Authorization: [employer] (Disability/Workers Comp Pending)  Authorization Time Period:    Authorization Visit#:   of    Subjective Symptoms/Limitations Symptoms: S: I tried to massage my own shoulder but my arm got tired.  Pain Assessment Pain Score: 4  Pain Location: Shoulder Pain Orientation: Right;Left Pain Type: Acute pain  Precautions/Restrictions  Precautions Precautions: None  Exercise/Treatments Supine Protraction: PROM;Strengthening;10 reps Protraction Weight (lbs): 1 Horizontal ABduction: PROM;Strengthening;10 reps Horizontal ABduction Weight (lbs): 1 External Rotation: PROM;Strengthening;10 reps External Rotation Weight (lbs): 1 Internal Rotation: PROM;Strengthening;10 reps Internal Rotation Weight (lbs): 1 Flexion: PROM;Strengthening;10 reps Shoulder Flexion Weight (lbs): 1 ABduction: PROM;Strengthening;10 reps Shoulder ABduction Weight (lbs): 1 Standing Protraction: Strengthening;10 reps Protraction Weight (lbs): 1 Horizontal ABduction: Strengthening;10 reps Horizontal ABduction Weight (lbs): 1 Flexion: Strengthening;10 reps Shoulder Flexion Weight (lbs): 1 ABduction: Strengthening;10 reps Shoulder ABduction Weight (lbs): 1 ROM / Strengthening / Isometric Strengthening UBE (Upper Arm Bike): 3' and 3' at 2.0 Proximal Shoulder Strengthening, Supine: 10 times each with 1#        Manual Therapy Manual Therapy: Myofascial release Myofascial Release: Myofascial release (MFR) and manual stretching to right and left upper arm, shoulder, and scapular region to decrease pain and fascial restrictions and improve pain free mobility. Manual cervical traction and MFR to cervical  region.  Occupational Therapy Assessment and Plan OT Assessment and Plan Clinical Impression Statement: A: Not all exercises completed due to time constraint. Increased UBE bike to 2.0. OT Plan: P: Continue with exercises missed last session. MFR and manual stretching to BUE.    Goals Short Term Goals Short Term Goal 1: Pt will be educated on HEP Short Term Goal 2: Pt will attain right shoulder PROM to WNL in order to facilitate improved reaching towards her head for bathing. Short Term Goal 3: Pt will have pain of less than 4/10 in her right shoulder while driving her car, 5/5 instances. Short Term Goal 4: Pt will decrease fascial restrictions to min-mod overall in right shoulder region to facilitate pain-free movement. Short Term Goal 5: Patient will attain left shoulder PROM to WNL in order to facilitate improved reaching towards her head for bathing. *New Goal* Long Term Goals Long Term Goal 1: Pt will achieve highest level of functioning with all ADL, IADL, and leisure tasks. Long Term Goal 2: Pt will achieve right shoulder AROM to WNL in order to don her bra. Long Term Goal 3: Pt will have right shoulder strength of at least grossly 4/5 in order to wash her back. Long Term Goal 4: Pt will have pain of less than 2/10 in her right shoulder while changing gears in her car, 5/5 instances of town (stop/go) driving trips. Long Term Goal 5: Pt will have trace fascial restrictions overall in right shoulder region to facilitate pain free movement Long Term Goal 6: Pt will verbalize/demonstrate ability to brush her hair with complaint of 1/10 pain or less and minimla noted ROM compensatory techniques. Long Term Goal 7: patient will achieve left shoulder AROM to WNL in order to don her bra. *New goal*  Problem List Patient Active Problem List   Diagnosis Date Noted  . Decreased range of motion of right shoulder 01/17/2014  . Tight fascia 01/17/2014  . Carpal  tunnel syndrome 12/07/2013  . Neck  pain 12/07/2013  . Degenerative disc disease, cervical 12/07/2013  . Acute cervical adenitis 11/15/2013  . Left serous otitis media 11/15/2013  . Acute pharyngitis 11/15/2013  . Dyspepsia 08/24/2013  . Thoracic back pain 07/31/2013  . Abdominal pain, epigastric 07/31/2013  . Routine general medical examination at a health care facility 07/31/2013  . PMB (postmenopausal bleeding) 07/29/2013  . CTS (carpal tunnel syndrome) 07/08/2013  . Polyneuropathy 07/08/2013  . HTN (hypertension), benign 07/04/2013  . Diabetic nephropathy 06/23/2013  . TIA (transient ischemic attack) 04/28/2013  . Chronic kidney disease 04/28/2013  . Pain in joint, shoulder region 04/26/2013  . Muscle weakness (generalized) 04/26/2013  . Tennis elbow syndrome 04/15/2013  . Rotator cuff syndrome of left shoulder 04/15/2013  . Unspecified vitamin D deficiency 03/20/2013  . Onychomycosis of toenail 03/20/2013  . Left arm pain 03/19/2013  . HEADACHE 11/30/2008  . DIABETES MELLITUS, WITH RENAL COMPLICATIONS 38/45/3646  . FATIGUE 10/24/2008  . Gastroparesis 06/16/2008  . Diabetes mellitus, insulin dependent (IDDM), uncontrolled 04/13/2008  . Other and unspecified hyperlipidemia 04/13/2008  . Generalized anxiety disorder 04/13/2008    End of Session Activity Tolerance: Patient tolerated treatment well General Behavior During Therapy: [facility] for tasks assessed/performed   Ailene Ravel, OTR/L,CBIS   03/03/2014, 9:36 AM

## 2014-03-08 ENCOUNTER — Ambulatory Visit (HOSPITAL_COMMUNITY): Payer: BC Managed Care – PPO | Admitting: Specialist

## 2014-03-08 ENCOUNTER — Ambulatory Visit: Payer: BC Managed Care – PPO | Admitting: Family Medicine

## 2014-03-10 ENCOUNTER — Ambulatory Visit (HOSPITAL_COMMUNITY)
Admission: RE | Admit: 2014-03-10 | Discharge: 2014-03-10 | Disposition: A | Discharge status: Home / Self Care | Payer: BC Managed Care – PPO | Source: Ambulatory Visit | Attending: Family Medicine | Admitting: Family Medicine

## 2014-03-10 DIAGNOSIS — M25519 Pain in unspecified shoulder: Secondary | ICD-10-CM

## 2014-03-10 DIAGNOSIS — M25611 Stiffness of right shoulder, not elsewhere classified: Secondary | ICD-10-CM

## 2014-03-10 DIAGNOSIS — M6289 Other specified disorders of muscle: Secondary | ICD-10-CM

## 2014-03-10 DIAGNOSIS — M629 Disorder of muscle, unspecified: Secondary | ICD-10-CM

## 2014-03-10 DIAGNOSIS — M6281 Muscle weakness (generalized): Secondary | ICD-10-CM

## 2014-03-10 NOTE — Progress Notes (Signed)
Occupational Therapy Treatment Patient Details  Name: Ashley Armstrong MRN: [id number] Date of Birth: December 27, 1965  Today's Date: 03/10/2014 Time: 1017-5102 OT Time Calculation (min): 29 min Manual therapy 585-277 21' Therapeutic exercies 919-927 8'  Visit#: 12 of 16  Re-eval: 03/15/14    Authorization: [employer] (Disability/Workers Comp Pending)  Authorization Time Period:    Authorization Visit#: 12 of    Subjective Symptoms/Limitations Symptoms: S:  My right shoulder feels pretty sore, pain in the back of the shoulder. Pain Assessment Currently in Pain?: Yes Pain Score: 7  Pain Location: Shoulder Pain Orientation: Right;Left Pain Type: Chronic pain  Precautions/Restrictions    Progress as tolerated Exercise/Treatments Supine Protraction: PROM;10 reps;Both Horizontal ABduction: Both;10 reps External Rotation: PROM;Both;10 reps Internal Rotation: PROM;Both;10 reps Flexion: PROM;Both;10 reps ABduction: PROM;10 reps;Both ROM / Strengthening / Isometric Strengthening UBE (Upper Arm Bike): 3' and 3' at 2.5             Manual Therapy Manual Therapy: Myofascial release Myofascial Release: Myofascial release (MFR) and manual stretching to right and left upper arm, shoulder, and scapular region to decrease pain and fascial restrictions and improve pain free mobility. Manual cervical traction and MFR to cervical region.  Occupational Therapy Assessment and Plan OT Assessment and Plan Clinical Impression Statement: A:  Patient felt some relief of pain in bilateral shoulders after MFR. OT Plan: P:  Resume therapeutic exercises and reassess bilateral shoulders.   Goals Short Term Goals Short Term Goal 5: Patient will attain left shoulder PROM to WNL in order to facilitate improved reaching towards her head for bathing. *New Goal*  Problem List Patient Active Problem List   Diagnosis Date Noted  . Decreased range of motion of right shoulder 01/17/2014  .  Tight fascia 01/17/2014  . Carpal tunnel syndrome 12/07/2013  . Neck pain 12/07/2013  . Degenerative disc disease, cervical 12/07/2013  . Acute cervical adenitis 11/15/2013  . Left serous otitis media 11/15/2013  . Acute pharyngitis 11/15/2013  . Dyspepsia 08/24/2013  . Thoracic back pain 07/31/2013  . Abdominal pain, epigastric 07/31/2013  . Routine general medical examination at a health care facility 07/31/2013  . PMB (postmenopausal bleeding) 07/29/2013  . CTS (carpal tunnel syndrome) 07/08/2013  . Polyneuropathy 07/08/2013  . HTN (hypertension), benign 07/04/2013  . Diabetic nephropathy 06/23/2013  . TIA (transient ischemic attack) 04/28/2013  . Chronic kidney disease 04/28/2013  . Pain in joint, shoulder region 04/26/2013  . Muscle weakness (generalized) 04/26/2013  . Tennis elbow syndrome 04/15/2013  . Rotator cuff syndrome of left shoulder 04/15/2013  . Unspecified vitamin D deficiency 03/20/2013  . Onychomycosis of toenail 03/20/2013  . Left arm pain 03/19/2013  . HEADACHE 11/30/2008  . DIABETES MELLITUS, WITH RENAL COMPLICATIONS 82/42/3536  . FATIGUE 10/24/2008  . Gastroparesis 06/16/2008  . Diabetes mellitus, insulin dependent (IDDM), uncontrolled 04/13/2008  . Other and unspecified hyperlipidemia 04/13/2008  . Generalized anxiety disorder 04/13/2008    End of Session Activity Tolerance: Patient tolerated treatment well General Behavior During Therapy: [facility] for tasks assessed/performed  [place], OTR/L 03/10/2014, 9:28 AM

## 2014-03-15 ENCOUNTER — Other Ambulatory Visit: Payer: Self-pay | Admitting: Gastroenterology

## 2014-03-15 ENCOUNTER — Inpatient Hospital Stay (HOSPITAL_COMMUNITY): Admission: RE | Admit: 2014-03-15 | Payer: BC Managed Care – PPO | Source: Ambulatory Visit

## 2014-03-17 ENCOUNTER — Ambulatory Visit (HOSPITAL_COMMUNITY)
Admission: RE | Admit: 2014-03-17 | Discharge: 2014-03-17 | Disposition: A | Discharge status: Home / Self Care | Payer: BC Managed Care – PPO | Source: Ambulatory Visit | Attending: Orthopedic Surgery | Admitting: Orthopedic Surgery

## 2014-03-17 DIAGNOSIS — E119 Type 2 diabetes mellitus without complications: Secondary | ICD-10-CM | POA: Insufficient documentation

## 2014-03-17 DIAGNOSIS — N189 Chronic kidney disease, unspecified: Secondary | ICD-10-CM | POA: Insufficient documentation

## 2014-03-17 DIAGNOSIS — I129 Hypertensive chronic kidney disease with stage 1 through stage 4 chronic kidney disease, or unspecified chronic kidney disease: Secondary | ICD-10-CM | POA: Insufficient documentation

## 2014-03-17 DIAGNOSIS — IMO0001 Reserved for inherently not codable concepts without codable children: Secondary | ICD-10-CM | POA: Insufficient documentation

## 2014-03-17 DIAGNOSIS — Z794 Long term (current) use of insulin: Secondary | ICD-10-CM | POA: Insufficient documentation

## 2014-03-17 DIAGNOSIS — M25619 Stiffness of unspecified shoulder, not elsewhere classified: Secondary | ICD-10-CM | POA: Insufficient documentation

## 2014-03-17 DIAGNOSIS — M25519 Pain in unspecified shoulder: Secondary | ICD-10-CM | POA: Insufficient documentation

## 2014-03-17 NOTE — Evaluation (Signed)
Occupational Therapy Re-Evaluation  Patient Details  Name: Ashley Armstrong MRN: [id number] Date of Birth: 09-Sep-1966  Today's Date: 03/17/2014 Time: 1100-1206 OT Time Calculation (min): 66 min Manual 1100-1130 (30') Therapeutic Exercises 1130-1140 (10') ROM Testing 1140-1155 (24') Self Care: 1155-1206 (11')  Visit#: 29 of 16  Re-eval: 04/14/14     Authorization: [employer] (Disability/Workers Comp Pending)  Authorization Time Period: n/a  Authorization Visit#: 5 of     Past Medical History:  Past Medical History  Diagnosis Date  . Nicotine addiction   . Anxiety   . IDDM (insulin dependent diabetes mellitus)   . Dyslipidemia   . Headache(784.0)   . TIA (transient ischemic attack)     Dr. Merlene Laughter  . Gastroparesis due to DM OCT 2014    75% AT 2 HRS, GLU >    Past Surgical History:  Past Surgical History  Procedure Laterality Date  . Laser surgery bilateral eye    . Esophagogastroduodenoscopy N/A 08/31/2013    Procedure: ESOPHAGOGASTRODUODENOSCOPY (EGD);  Surgeon: Danie Binder, MD;  Location: [facility];  Service: Endoscopy;  Laterality: N/A;  11:15    Subjective Symptoms/Limitations Symptoms: "I still have pain, and it still feels like it needs to pop. and it tries to drop down."  Assessment ADL/Vision/Perception ADL ADL Comments: pt is still having difficulty getting on shirts and bra. Pt still having some difficulty washing hair. pt cannot wash her back.  Pt has less pain when driving, but still ahs pain when turning steering wheel. Pt engages in some IADLs to try to use arms around the house. Pt still having difficulty opening cans and jars.  Additional Assessments RUE AROM (degrees) RUE Overall AROM Comments: assessed in seated and supine ER/IR in adducted position (4/7 measurements) Right Shoulder Flexion:  (132/126  (130/140)) Right Shoulder ABduction:  (132/121 (110/100)) Right Shoulder Internal Rotation:  (81/ 86   (80/90)) Right Shoulder  External Rotation:  (22/ 26  (20/32)) RUE PROM (degrees) RUE Overall PROM Comments: assessed in supine ER/IR with shoulder adducted (4/7 measurements) Right Shoulder Flexion: 157 Degrees (150) Right Shoulder ABduction: 176 Degrees (118) Right Shoulder Internal Rotation: 93 Degrees (90) Right Shoulder External Rotation: 30 Degrees (30) RUE Strength Right Shoulder Flexion: 3+/5 (3+/5) Right Shoulder ABduction: 3+/5 (3+/5) Right Shoulder Internal Rotation: 3+/5 (3+/7) Right Shoulder External Rotation: 3+/5 (3+/5) LUE AROM (degrees) LUE Overall AROM Comments: assessed seated. IR/ER Adducted (4/30 measurements) Left Shoulder Flexion: 121 Degrees (130) Left Shoulder ABduction: 115 Degrees (110) Left Shoulder Internal Rotation: 105 Degrees (90) Left Shoulder External Rotation: 23 Degrees (15) LUE Strength Left Shoulder Flexion: 4/5 (4/5) Left Shoulder ABduction: 4/5 (4/5) Left Shoulder Internal Rotation: 4/5 (3+/5) Left Shoulder External Rotation: 4/5 (3+/5)     Exercise/Treatments Supine Protraction: PROM;10 reps;Both Horizontal ABduction: PROM;Both;10 reps External Rotation: PROM;Both;10 reps Internal Rotation: PROM;Both;10 reps Flexion: PROM;Both;10 reps ABduction: PROM;10 reps;Both  Manual Therapy Manual Therapy: Myofascial release Myofascial Release: Myofascial release (MFR) and manual stretching to right and left upper arm, shoulder, and scapular regions to decrease pain and fascial restrictions and improve pain free mobility. Manual cervical traction and MFR to cervical region  Occupational Therapy Assessment and Plan OT Assessment and Plan Clinical Impression Statement: Re-Evaluation completed this session on bilateral upper extremities.  pt has met 3/5 STG and is progressing towards remaining 2/5 STG and all 7/7 LTG.  Pt has made slight progress over the past month.  Discussed progressed and will continue OT services for another month for further treatment. Rehab Potential:  Good Clinical Impairments Affecting Rehab Potential: Complicated medical history OT Frequency: Min 2X/week OT Duration: 4 weeks OT Treatment/Interventions: Self-care/ADL training;Therapeutic exercise;Energy conservation;Manual therapy;Modalities;Therapeutic activities;Patient/family education OT Plan: Resume therapueitc activities. Complete FOTO.  Goals Short Term Goals Short Term Goal 1: Pt will be educated on HEP Short Term Goal 1 Progress: Met Short Term Goal 2: Pt will attain right shoulder PROM to WNL in order to facilitate improved reaching towards her head for bathing. Short Term Goal 2 Progress: Partly met Short Term Goal 3: Pt will have pain of less than 4/10 in her right shoulder while driving her car, 5/5 instances. Short Term Goal 3 Progress: Met Short Term Goal 4: Pt will decrease fascial restrictions to min-mod overall in right shoulder region to facilitate pain-free movement. Short Term Goal 4 Progress: Met Short Term Goal 5: Patient will attain left shoulder PROM to WNL in order to facilitate improved reaching towards her head for bathing. *New Goal* Short Term Goal 5 Progress: Progressing toward goal Long Term Goals Long Term Goal 1: Pt will achieve highest level of functioning with all ADL, IADL, and leisure tasks. Long Term Goal 1 Progress: Progressing toward goal Long Term Goal 2: Pt will achieve right shoulder AROM to WNL in order to don her bra. Long Term Goal 2 Progress: Progressing toward goal Long Term Goal 3: Pt will have Bilateral shoulder strength of at least grossly 4+/5 in order to wash her back. *modified goal* Long Term Goal 3 Progress: Progressing toward goal Long Term Goal 4: Pt will have pain of less than 2/10 in her right shoulder while changing gears in her car, 5/5 instances of town (stop/go) driving trips. Long Term Goal 4 Progress: Progressing toward goal Long Term Goal 5: Pt will have trace fascial restrictions overall in right shoulder region to  facilitate pain free movement Long Term Goal 5 Progress: Progressing toward goal Long Term Goal 6: Pt will verbalize/demonstrate ability to brush her hair with complaint of 1/10 pain or less and minimla noted ROM compensatory techniques. Long Term Goal 6 Progress: Progressing toward goal Long Term Goal 7: patient will achieve left shoulder AROM to WNL in order to don her bra. *New goal* Long Term Goal 7 Progress: Progressing toward goal  Problem List Patient Active Problem List   Diagnosis Date Noted  . Decreased range of motion of right shoulder 01/17/2014  . Tight fascia 01/17/2014  . Carpal tunnel syndrome 12/07/2013  . Neck pain 12/07/2013  . Degenerative disc disease, cervical 12/07/2013  . Acute cervical adenitis 11/15/2013  . Left serous otitis media 11/15/2013  . Acute pharyngitis 11/15/2013  . Dyspepsia 08/24/2013  . Thoracic back pain 07/31/2013  . Abdominal pain, epigastric 07/31/2013  . Routine general medical examination at a health care facility 07/31/2013  . PMB (postmenopausal bleeding) 07/29/2013  . CTS (carpal tunnel syndrome) 07/08/2013  . Polyneuropathy 07/08/2013  . HTN (hypertension), benign 07/04/2013  . Diabetic nephropathy 06/23/2013  . TIA (transient ischemic attack) 04/28/2013  . Chronic kidney disease 04/28/2013  . Pain in joint, shoulder region 04/26/2013  . Muscle weakness (generalized) 04/26/2013  . Tennis elbow syndrome 04/15/2013  . Rotator cuff syndrome of left shoulder 04/15/2013  . Unspecified vitamin D deficiency 03/20/2013  . Onychomycosis of toenail 03/20/2013  . Left arm pain 03/19/2013  . HEADACHE 11/30/2008  . DIABETES MELLITUS, WITH RENAL COMPLICATIONS 41/66/0630  . FATIGUE 10/24/2008  . Gastroparesis 06/16/2008  . Diabetes mellitus, insulin dependent (IDDM), uncontrolled 04/13/2008  . Other and unspecified hyperlipidemia 04/13/2008  .  Generalized anxiety disorder 04/13/2008    End of Session Activity Tolerance: Patient  tolerated treatment well General Behavior During Therapy: [facility] for tasks assessed/performed  GO    Bea Graff, MS, OTR/L [phone]  03/17/2014, 12:15 PM  Physician Documentation Your signature is required to indicate approval of the treatment plan as stated above.  Please sign and either send electronically or make a copy of this report for your files and return this physician signed original.  Please mark one 1.__approve of plan  2. ___approve of plan with the following conditions.   ______________________________                                                          _____________________ Physician Signature                                                                                                             Date

## 2014-03-22 ENCOUNTER — Ambulatory Visit (HOSPITAL_COMMUNITY)
Admission: RE | Admit: 2014-03-22 | Payer: BC Managed Care – PPO | Source: Ambulatory Visit | Attending: Family Medicine | Admitting: Family Medicine

## 2014-03-22 ENCOUNTER — Ambulatory Visit (HOSPITAL_COMMUNITY): Payer: BC Managed Care – PPO

## 2014-03-24 ENCOUNTER — Ambulatory Visit (HOSPITAL_COMMUNITY): Payer: BC Managed Care – PPO

## 2014-03-24 ENCOUNTER — Ambulatory Visit (HOSPITAL_COMMUNITY)
Admission: RE | Admit: 2014-03-24 | Discharge: 2014-03-24 | Disposition: A | Discharge status: Home / Self Care | Payer: BC Managed Care – PPO | Source: Ambulatory Visit | Attending: Family Medicine | Admitting: Family Medicine

## 2014-03-24 NOTE — Progress Notes (Addendum)
Occupational Therapy Treatment Patient Details  Name: Ashley Armstrong MRN: [id number] Date of Birth: December 20, 1965  Today's Date: 03/24/2014 Time: 0254-2706 OT Time Calculation (min): 44 min Manual 848-915 (27') Therapeutic Exercises 237-628 (17')  Visit#: 14 of 21  Re-eval: 04/14/14   Special Tests: FOTO 41/100 (59% limited)  Authorization: [employer] (Disability/Workers Comp Pending)  Authorization Time Period: n/a  Authorization Visit#: 14 of    Subjective Symptoms/Limitations Symptoms: "i was hurting real bad on Tuesday night - so bad I couldn't sit still in church." Pain Assessment Currently in Pain?: Yes Pain Score: 4  Pain Location: Shoulder Pain Orientation: Left;Right Pain Type: Chronic pain  Precautions/Restrictions     Exercise/Treatments Supine Protraction: PROM;Strengthening;Both;10 reps Protraction Weight (lbs): 1 Horizontal ABduction: PROM;Strengthening;Both;10 reps Horizontal ABduction Weight (lbs): 1 External Rotation: PROM;Strengthening;Both;10 reps External Rotation Weight (lbs): 1 Internal Rotation: PROM;Strengthening;Both;10 reps Internal Rotation Weight (lbs): 1 Flexion: PROM;Strengthening;Both;10 reps Shoulder Flexion Weight (lbs): 1 ABduction: PROM;Strengthening;Both;10 reps Shoulder ABduction Weight (lbs): 1    Manual Therapy Manual Therapy: Myofascial release Myofascial Release: Myofascial release (MFR) and manual stretching to right and left upper arm, shoulder, and scapular regions to decrease pain and fascial restrictions and improve pain free mobility. Manual cervical traction and MFR to cervical region.    Occupational Therapy Assessment and Plan OT Assessment and Plan Clinical Impression Statement: Pt presenting with some increased tightness in bilateral shoulder regions and left elbow. Significant part of session spent on myofascial release to decrase tigtness in bilateral shoulders and cervical neck region. OT Plan: Continue  bilateral shoulder exericses. Add external rotation stretch.  Theraband.   Goals Short Term Goals Short Term Goal 1: Pt will be educated on HEP Short Term Goal 1 Progress: Met Short Term Goal 2: Pt will attain right shoulder PROM to WNL in order to facilitate improved reaching towards her head for bathing. Short Term Goal 2 Progress: Partly met Short Term Goal 3: Pt will have pain of less than 4/10 in her right shoulder while driving her car, 5/5 instances. Short Term Goal 3 Progress: Met Short Term Goal 4: Pt will decrease fascial restrictions to min-mod overall in right shoulder region to facilitate pain-free movement. Short Term Goal 4 Progress: Met Short Term Goal 5: Patient will attain left shoulder PROM to WNL in order to facilitate improved reaching towards her head for bathing. *New Goal* Short Term Goal 5 Progress: Progressing toward goal Long Term Goals Long Term Goal 1: Pt will achieve highest level of functioning with all ADL, IADL, and leisure tasks. Long Term Goal 1 Progress: Progressing toward goal Long Term Goal 2: Pt will achieve right shoulder AROM to WNL in order to don her bra. Long Term Goal 2 Progress: Progressing toward goal Long Term Goal 3: Pt will have Bilateral shoulder strength of at least grossly 4+/5 in order to wash her back. *modified goal* Long Term Goal 3 Progress: Progressing toward goal Long Term Goal 4: Pt will have pain of less than 2/10 in her right shoulder while changing gears in her car, 5/5 instances of town (stop/go) driving trips. Long Term Goal 4 Progress: Progressing toward goal Long Term Goal 5: Pt will have trace fascial restrictions overall in right shoulder region to facilitate pain free movement Long Term Goal 5 Progress: Progressing toward goal Long Term Goal 6: Pt will verbalize/demonstrate ability to brush her hair with complaint of 1/10 pain or less and minimla noted ROM compensatory techniques. Long Term Goal 6 Progress: Progressing  toward goal Long  Term Goal 7: patient will achieve left shoulder AROM to WNL in order to don her bra. *New goal* Long Term Goal 7 Progress: Progressing toward goal  Problem List Patient Active Problem List   Diagnosis Date Noted  . Decreased range of motion of right shoulder 01/17/2014  . Tight fascia 01/17/2014  . Carpal tunnel syndrome 12/07/2013  . Neck pain 12/07/2013  . Degenerative disc disease, cervical 12/07/2013  . Acute cervical adenitis 11/15/2013  . Left serous otitis media 11/15/2013  . Acute pharyngitis 11/15/2013  . Dyspepsia 08/24/2013  . Thoracic back pain 07/31/2013  . Abdominal pain, epigastric 07/31/2013  . Routine general medical examination at a health care facility 07/31/2013  . PMB (postmenopausal bleeding) 07/29/2013  . CTS (carpal tunnel syndrome) 07/08/2013  . Polyneuropathy 07/08/2013  . HTN (hypertension), benign 07/04/2013  . Diabetic nephropathy 06/23/2013  . TIA (transient ischemic attack) 04/28/2013  . Chronic kidney disease 04/28/2013  . Pain in joint, shoulder region 04/26/2013  . Muscle weakness (generalized) 04/26/2013  . Tennis elbow syndrome 04/15/2013  . Rotator cuff syndrome of left shoulder 04/15/2013  . Unspecified vitamin D deficiency 03/20/2013  . Onychomycosis of toenail 03/20/2013  . Left arm pain 03/19/2013  . HEADACHE 11/30/2008  . DIABETES MELLITUS, WITH RENAL COMPLICATIONS 66/29/4765  . FATIGUE 10/24/2008  . Gastroparesis 06/16/2008  . Diabetes mellitus, insulin dependent (IDDM), uncontrolled 04/13/2008  . Other and unspecified hyperlipidemia 04/13/2008  . Generalized anxiety disorder 04/13/2008    End of Session Activity Tolerance: Patient tolerated treatment well General Behavior During Therapy: [facility] for tasks assessed/performed  GO    Bea Graff, MS, OTR/L [phone]  03/24/2014, 10:20 AM

## 2014-03-29 ENCOUNTER — Ambulatory Visit (HOSPITAL_COMMUNITY)
Admission: RE | Admit: 2014-03-29 | Discharge: 2014-03-29 | Disposition: A | Discharge status: Home / Self Care | Payer: BC Managed Care – PPO | Source: Ambulatory Visit

## 2014-03-29 ENCOUNTER — Ambulatory Visit (HOSPITAL_COMMUNITY): Payer: BC Managed Care – PPO

## 2014-03-29 DIAGNOSIS — M25611 Stiffness of right shoulder, not elsewhere classified: Secondary | ICD-10-CM

## 2014-03-29 DIAGNOSIS — M6281 Muscle weakness (generalized): Secondary | ICD-10-CM

## 2014-03-29 DIAGNOSIS — M629 Disorder of muscle, unspecified: Secondary | ICD-10-CM

## 2014-03-29 DIAGNOSIS — M25519 Pain in unspecified shoulder: Secondary | ICD-10-CM

## 2014-03-29 DIAGNOSIS — M6289 Other specified disorders of muscle: Secondary | ICD-10-CM

## 2014-03-29 NOTE — Progress Notes (Signed)
Occupational Therapy Treatment Patient Details  Name: Ashley Armstrong MRN: [id number] Date of Birth: October 13, 1966  Today's Date: 03/29/2014 Time: 8182-9937 OT Time Calculation (min): 44 min MFR 169-678 13' Therex 938-101 20'  Visit#: 47 of 21  Re-eval: 04/14/14    Authorization: [employer] (Disability/Workers Comp Pending)  Authorization Time Period: n/a  Authorization Visit#: 15 of    Subjective Symptoms/Limitations Symptoms: S: My head hasn't been feeling right. I just don't feel good. I'm going to the see Dr. Moshe Cipro tomorrow.  Pain Assessment Currently in Pain?: Yes Pain Score: 5  Pain Location: Shoulder Pain Orientation: Right;Left Pain Type: Chronic pain  Precautions/Restrictions  Precautions Precautions: None  Exercise/Treatments Supine Protraction: PROM;10 reps;Strengthening;12 reps Protraction Weight (lbs): 1 Horizontal ABduction: PROM;10 reps;Strengthening;12 reps Horizontal ABduction Weight (lbs): 1 External Rotation: PROM;10 reps;Strengthening;12 reps External Rotation Weight (lbs): 1 Internal Rotation: PROM;10 reps;Strengthening;12 reps Internal Rotation Weight (lbs): 1 Flexion: PROM;10 reps;Strengthening;12 reps Shoulder Flexion Weight (lbs): 1 ABduction: PROM;10 reps;Strengthening;12 reps Shoulder ABduction Weight (lbs): 1 Standing Protraction: Strengthening;12 reps Protraction Weight (lbs): 1 Horizontal ABduction: Strengthening;12 reps Horizontal ABduction Weight (lbs): 1 External Rotation: Strengthening;12 reps External Rotation Weight (lbs): 1 Internal Rotation: Strengthening;12 reps Internal Rotation Weight (lbs): 1 Flexion: Strengthening;12 reps Shoulder Flexion Weight (lbs): 1 ABduction: Strengthening;12 reps Shoulder ABduction Weight (lbs): 1 ROM / Strengthening / Isometric Strengthening Proximal Shoulder Strengthening, Supine: 10 times each with 1#   Stretches External Rotation Stretch: 2 reps;30 seconds    Manual  Therapy Manual Therapy: Myofascial release Myofascial Release: Myofascial release (MFR) and manual stretching to right and left upper arm, shoulder, and scapular regions to decrease pain and fascial restrictions and improve pain free mobility. Manual cervical traction and MFR to cervical region  Occupational Therapy Assessment and Plan OT Assessment and Plan Clinical Impression Statement: A: Added ER stretch and increased reps with 1# weights. Unable to complete theraband due to time constraint.  OT Plan: A: Cont with theraband.    Goals Short Term Goals Time to Complete Short Term Goals: 4 weeks Short Term Goal 1: Pt will be educated on HEP Short Term Goal 2: Pt will attain right shoulder PROM to WNL in order to facilitate improved reaching towards her head for bathing. Short Term Goal 3: Pt will have pain of less than 4/10 in her right shoulder while driving her car, 5/5 instances. Short Term Goal 4: Pt will decrease fascial restrictions to min-mod overall in right shoulder region to facilitate pain-free movement. Short Term Goal 5: Patient will attain left shoulder PROM to WNL in order to facilitate improved reaching towards her head for bathing. *New Goal* Long Term Goals Time to Complete Long Term Goals: 8 weeks Long Term Goal 1: Pt will achieve highest level of functioning with all ADL, IADL, and leisure tasks. Long Term Goal 1 Progress: Progressing toward goal Long Term Goal 2: Pt will achieve right shoulder AROM to WNL in order to don her bra. Long Term Goal 2 Progress: Progressing toward goal Long Term Goal 3: Pt will have Bilateral shoulder strength of at least grossly 4+/5 in order to wash her back. *modified goal* Long Term Goal 3 Progress: Progressing toward goal Long Term Goal 4: Pt will have pain of less than 2/10 in her right shoulder while changing gears in her car, 5/5 instances of town (stop/go) driving trips. Long Term Goal 4 Progress: Progressing toward goal Long Term  Goal 5: Pt will have trace fascial restrictions overall in right shoulder region to facilitate pain free  movement Long Term Goal 5 Progress: Progressing toward goal Long Term Goal 6: Pt will verbalize/demonstrate ability to brush her hair with complaint of 1/10 pain or less and minimla noted ROM compensatory techniques. Long Term Goal 6 Progress: Progressing toward goal Long Term Goal 7: patient will achieve left shoulder AROM to WNL in order to don her bra. *New goal* Long Term Goal 7 Progress: Progressing toward goal  Problem List Patient Active Problem List   Diagnosis Date Noted  . Decreased range of motion of right shoulder 01/17/2014  . Tight fascia 01/17/2014  . Carpal tunnel syndrome 12/07/2013  . Neck pain 12/07/2013  . Degenerative disc disease, cervical 12/07/2013  . Acute cervical adenitis 11/15/2013  . Left serous otitis media 11/15/2013  . Acute pharyngitis 11/15/2013  . Dyspepsia 08/24/2013  . Thoracic back pain 07/31/2013  . Abdominal pain, epigastric 07/31/2013  . Routine general medical examination at a health care facility 07/31/2013  . PMB (postmenopausal bleeding) 07/29/2013  . CTS (carpal tunnel syndrome) 07/08/2013  . Polyneuropathy 07/08/2013  . HTN (hypertension), benign 07/04/2013  . Diabetic nephropathy 06/23/2013  . TIA (transient ischemic attack) 04/28/2013  . Chronic kidney disease 04/28/2013  . Pain in joint, shoulder region 04/26/2013  . Muscle weakness (generalized) 04/26/2013  . Tennis elbow syndrome 04/15/2013  . Rotator cuff syndrome of left shoulder 04/15/2013  . Unspecified vitamin D deficiency 03/20/2013  . Onychomycosis of toenail 03/20/2013  . Left arm pain 03/19/2013  . HEADACHE 11/30/2008  . DIABETES MELLITUS, WITH RENAL COMPLICATIONS 01/77/9390  . FATIGUE 10/24/2008  . Gastroparesis 06/16/2008  . Diabetes mellitus, insulin dependent (IDDM), uncontrolled 04/13/2008  . Other and unspecified hyperlipidemia 04/13/2008  . Generalized  anxiety disorder 04/13/2008    End of Session Activity Tolerance: Patient tolerated treatment well General Behavior During Therapy: [facility] for tasks assessed/performed   Ailene Ravel, OTR/L,CBIS   03/29/2014, 9:33 AM

## 2014-03-30 ENCOUNTER — Ambulatory Visit: Payer: BC Managed Care – PPO | Admitting: Family Medicine

## 2014-03-31 ENCOUNTER — Ambulatory Visit (HOSPITAL_COMMUNITY)
Admission: RE | Admit: 2014-03-31 | Discharge: 2014-03-31 | Disposition: A | Discharge status: Home / Self Care | Payer: BC Managed Care – PPO | Source: Ambulatory Visit | Attending: Family Medicine | Admitting: Family Medicine

## 2014-03-31 ENCOUNTER — Ambulatory Visit (HOSPITAL_COMMUNITY): Payer: BC Managed Care – PPO

## 2014-03-31 DIAGNOSIS — M6289 Other specified disorders of muscle: Secondary | ICD-10-CM

## 2014-03-31 DIAGNOSIS — M25611 Stiffness of right shoulder, not elsewhere classified: Secondary | ICD-10-CM

## 2014-03-31 DIAGNOSIS — M629 Disorder of muscle, unspecified: Secondary | ICD-10-CM

## 2014-03-31 DIAGNOSIS — M25519 Pain in unspecified shoulder: Secondary | ICD-10-CM

## 2014-03-31 DIAGNOSIS — M6281 Muscle weakness (generalized): Secondary | ICD-10-CM

## 2014-03-31 NOTE — Progress Notes (Signed)
Occupational Therapy Treatment Patient Details  Name: Ashley Armstrong MRN: [id number] Date of Birth: 10/25/1966  Today's Date: 03/31/2014 Time: 3664-4034 OT Time Calculation (min): 48 min MFR 855-910 15' Therex 742-595 33'  Visit#: 24 of 21  Re-eval: 04/14/14    Authorization: [employer] (Disability/Workers Comp Pending)  Authorization Time Period: n/a  Authorization Visit#: 16 of    Subjective Symptoms/Limitations Symptoms: S: everything i did yesterday hurt. Pain Assessment Currently in Pain?: Yes Pain Score: 4  Pain Location: Shoulder Pain Orientation: Right;Left Pain Type: Chronic pain  Precautions/Restrictions  Precautions Precautions: None  Exercise/Treatments Supine Protraction: PROM;10 reps;Strengthening;12 reps Protraction Weight (lbs): 1 Horizontal ABduction: PROM;10 reps;Strengthening;12 reps Horizontal ABduction Weight (lbs): 1 External Rotation: PROM;10 reps;Strengthening;12 reps External Rotation Weight (lbs): 1 Internal Rotation: PROM;10 reps;Strengthening;12 reps Internal Rotation Weight (lbs): 1 Flexion: PROM;10 reps;Strengthening;12 reps Shoulder Flexion Weight (lbs): 1 ABduction: PROM;10 reps;Strengthening;12 reps Shoulder ABduction Weight (lbs): 1 Standing Protraction: Strengthening;12 reps Protraction Weight (lbs): 1 Horizontal ABduction: Strengthening;12 reps Horizontal ABduction Weight (lbs): 1 External Rotation: Strengthening;12 reps External Rotation Weight (lbs): 1 Internal Rotation: Strengthening;12 reps Internal Rotation Weight (lbs): 1 Flexion: Strengthening;12 reps Shoulder Flexion Weight (lbs): 1 ABduction: Strengthening;12 reps Shoulder ABduction Weight (lbs): 1 Extension: Theraband;12 reps Theraband Level (Shoulder Extension): Level 2 (Red) Row: Theraband;12 reps Theraband Level (Shoulder Row): Level 2 (Red) Retraction: Theraband;12 reps Theraband Level (Shoulder Retraction): Level 2 (Red) ROM / Strengthening /  Isometric Strengthening UBE (Upper Arm Bike): 3' and 3' at 2.5 Proximal Shoulder Strengthening, Supine: 10 times each with 1# Proximal Shoulder Strengthening, Seated: 10X with 1#      Manual Therapy Manual Therapy: Myofascial release Myofascial Release: Myofascial release (MFR) and manual stretching to right and left upper arm, shoulder, and scapular regions to decrease pain and fascial restrictions and improve pain free mobility. Manual cervical traction and MFR to cervical region  Occupational Therapy Assessment and Plan OT Assessment and Plan Clinical Impression Statement: A: Added standing proximal strengthing. Patient tolerated well. Increased pain with passive stretching during ER. OT Plan: A: increase reps with supine exercises. Update HEP.   Goals Short Term Goals Time to Complete Short Term Goals: 4 weeks Short Term Goal 1: Pt will be educated on HEP Short Term Goal 2: Pt will attain right shoulder PROM to WNL in order to facilitate improved reaching towards her head for bathing. Short Term Goal 3: Pt will have pain of less than 4/10 in her right shoulder while driving her car, 5/5 instances. Short Term Goal 4: Pt will decrease fascial restrictions to min-mod overall in right shoulder region to facilitate pain-free movement. Short Term Goal 5: Patient will attain left shoulder PROM to WNL in order to facilitate improved reaching towards her head for bathing. *New Goal* Long Term Goals Time to Complete Long Term Goals: 8 weeks Long Term Goal 1: Pt will achieve highest level of functioning with all ADL, IADL, and leisure tasks. Long Term Goal 2: Pt will achieve right shoulder AROM to WNL in order to don her bra. Long Term Goal 3: Pt will have Bilateral shoulder strength of at least grossly 4+/5 in order to wash her back. *modified goal* Long Term Goal 4: Pt will have pain of less than 2/10 in her right shoulder while changing gears in her car, 5/5 instances of town (stop/go)  driving trips. Long Term Goal 5: Pt will have trace fascial restrictions overall in right shoulder region to facilitate pain free movement Long Term Goal 6: Pt will verbalize/demonstrate ability  to brush her hair with complaint of 1/10 pain or less and minimla noted ROM compensatory techniques. Long Term Goal 7: patient will achieve left shoulder AROM to WNL in order to don her bra. *New goal*  Problem List Patient Active Problem List   Diagnosis Date Noted  . Decreased range of motion of right shoulder 01/17/2014  . Tight fascia 01/17/2014  . Carpal tunnel syndrome 12/07/2013  . Neck pain 12/07/2013  . Degenerative disc disease, cervical 12/07/2013  . Acute cervical adenitis 11/15/2013  . Left serous otitis media 11/15/2013  . Acute pharyngitis 11/15/2013  . Dyspepsia 08/24/2013  . Thoracic back pain 07/31/2013  . Abdominal pain, epigastric 07/31/2013  . Routine general medical examination at a health care facility 07/31/2013  . PMB (postmenopausal bleeding) 07/29/2013  . CTS (carpal tunnel syndrome) 07/08/2013  . Polyneuropathy 07/08/2013  . HTN (hypertension), benign 07/04/2013  . Diabetic nephropathy 06/23/2013  . TIA (transient ischemic attack) 04/28/2013  . Chronic kidney disease 04/28/2013  . Pain in joint, shoulder region 04/26/2013  . Muscle weakness (generalized) 04/26/2013  . Tennis elbow syndrome 04/15/2013  . Rotator cuff syndrome of left shoulder 04/15/2013  . Unspecified vitamin D deficiency 03/20/2013  . Onychomycosis of toenail 03/20/2013  . Left arm pain 03/19/2013  . HEADACHE 11/30/2008  . DIABETES MELLITUS, WITH RENAL COMPLICATIONS 94/70/9628  . FATIGUE 10/24/2008  . Gastroparesis 06/16/2008  . Diabetes mellitus, insulin dependent (IDDM), uncontrolled 04/13/2008  . Other and unspecified hyperlipidemia 04/13/2008  . Generalized anxiety disorder 04/13/2008    End of Session Activity Tolerance: Patient tolerated treatment well General Behavior During  Therapy: [facility] for tasks assessed/performed   Ailene Ravel, OTR/L,CBIS   03/31/2014, 9:39 AM

## 2014-04-05 ENCOUNTER — Ambulatory Visit (HOSPITAL_COMMUNITY): Payer: BC Managed Care – PPO

## 2014-04-06 ENCOUNTER — Ambulatory Visit (HOSPITAL_COMMUNITY)
Admission: RE | Admit: 2014-04-06 | Discharge: 2014-04-06 | Disposition: A | Discharge status: Home / Self Care | Payer: BC Managed Care – PPO | Source: Ambulatory Visit | Attending: Family Medicine | Admitting: Family Medicine

## 2014-04-06 ENCOUNTER — Telehealth: Payer: Self-pay | Admitting: Family Medicine

## 2014-04-06 DIAGNOSIS — M6281 Muscle weakness (generalized): Secondary | ICD-10-CM

## 2014-04-06 DIAGNOSIS — M25519 Pain in unspecified shoulder: Secondary | ICD-10-CM

## 2014-04-06 DIAGNOSIS — M629 Disorder of muscle, unspecified: Secondary | ICD-10-CM

## 2014-04-06 DIAGNOSIS — M6289 Other specified disorders of muscle: Secondary | ICD-10-CM

## 2014-04-06 DIAGNOSIS — M25611 Stiffness of right shoulder, not elsewhere classified: Secondary | ICD-10-CM

## 2014-04-06 NOTE — Progress Notes (Signed)
Occupational Therapy Treatment Patient Details  Name: Ashley Armstrong MRN: [id number] Date of Birth: 1966/06/28  Today's Date: 04/06/2014 Time: 6203-5597 OT Time Calculation (min): 40 min MFR 850-905 15' Therex 905-930 25'  Visit#: 16 of 21  Re-eval: 04/14/14    Authorization: [employer] (Disability/Workers Comp Pending)  Authorization Time Period: n/a  Authorization Visit#: 17 of    Subjective Symptoms/Limitations Symptoms: S: My shoulders were hurting me a little in church. I couldn't sit still. Pain Assessment Currently in Pain?: Yes Pain Score: 4  Pain Location: Shoulder Pain Orientation: Right;Left Pain Type: Chronic pain  Precautions/Restrictions  Precautions Precautions: None  Exercise/Treatments Supine Protraction: PROM;10 reps;Strengthening;15 reps Protraction Weight (lbs): 1 Horizontal ABduction: PROM;10 reps;Strengthening;15 reps Horizontal ABduction Weight (lbs): 1 External Rotation: PROM;10 reps;Strengthening;15 reps External Rotation Weight (lbs): 1 Internal Rotation: PROM;10 reps;Strengthening;15 reps Internal Rotation Weight (lbs): 1 Flexion: PROM;10 reps;Strengthening;15 reps Shoulder Flexion Weight (lbs): 1 ABduction: PROM;10 reps;Strengthening;15 reps Shoulder ABduction Weight (lbs): 1 Standing Protraction: Strengthening;12 reps Protraction Weight (lbs): 1 Horizontal ABduction: Strengthening;12 reps Horizontal ABduction Weight (lbs): 1 External Rotation: Strengthening;12 reps External Rotation Weight (lbs): 1 Internal Rotation: Strengthening;12 reps Internal Rotation Weight (lbs): 1 Flexion: Strengthening;12 reps Shoulder Flexion Weight (lbs): 1 ABduction: Strengthening;12 reps Shoulder ABduction Weight (lbs): 1 ROM / Strengthening / Isometric Strengthening UBE (Upper Arm Bike): 3' and 3' at 2.5 Proximal Shoulder Strengthening, Supine: 12 times each with 1# Proximal Shoulder Strengthening, Seated: 12X with1#      Manual  Therapy Manual Therapy: Myofascial release Myofascial Release: Myofascial release (MFR) and manual stretching to right and left upper arm, shoulder, and scapular regions to decrease pain and fascial restrictions and improve pain free mobility. Manual cervical traction and MFR to cervical region  Occupational Therapy Assessment and Plan OT Assessment and Plan Clinical Impression Statement: A: Increased reps with supine exercises. patient tolerated well.  OT Plan: A: Wall wash, ball on the wall   Goals Short Term Goals Time to Complete Short Term Goals: 4 weeks Short Term Goal 1: Pt will be educated on HEP Short Term Goal 2: Pt will attain right shoulder PROM to WNL in order to facilitate improved reaching towards her head for bathing. Short Term Goal 3: Pt will have pain of less than 4/10 in her right shoulder while driving her car, 5/5 instances. Short Term Goal 4: Pt will decrease fascial restrictions to min-mod overall in right shoulder region to facilitate pain-free movement. Short Term Goal 5: Patient will attain left shoulder PROM to WNL in order to facilitate improved reaching towards her head for bathing. *New Goal* Short Term Goal 5 Progress: Progressing toward goal Long Term Goals Time to Complete Long Term Goals: 8 weeks Long Term Goal 1: Pt will achieve highest level of functioning with all ADL, IADL, and leisure tasks. Long Term Goal 1 Progress: Progressing toward goal Long Term Goal 2: Pt will achieve right shoulder AROM to WNL in order to don her bra. Long Term Goal 2 Progress: Progressing toward goal Long Term Goal 3: Pt will have Bilateral shoulder strength of at least grossly 4+/5 in order to wash her back. *modified goal* Long Term Goal 3 Progress: Progressing toward goal Long Term Goal 4: Pt will have pain of less than 2/10 in her right shoulder while changing gears in her car, 5/5 instances of town (stop/go) driving trips. Long Term Goal 4 Progress: Progressing toward  goal Long Term Goal 5: Pt will have trace fascial restrictions overall in right shoulder region to facilitate  pain free movement Long Term Goal 5 Progress: Progressing toward goal Long Term Goal 6: Pt will verbalize/demonstrate ability to brush her hair with complaint of 1/10 pain or less and minimla noted ROM compensatory techniques. Long Term Goal 6 Progress: Progressing toward goal Long Term Goal 7: patient will achieve left shoulder AROM to WNL in order to don her bra. *New goal* Long Term Goal 7 Progress: Progressing toward goal  Problem List Patient Active Problem List   Diagnosis Date Noted  . Decreased range of motion of right shoulder 01/17/2014  . Tight fascia 01/17/2014  . Carpal tunnel syndrome 12/07/2013  . Neck pain 12/07/2013  . Degenerative disc disease, cervical 12/07/2013  . Acute cervical adenitis 11/15/2013  . Left serous otitis media 11/15/2013  . Acute pharyngitis 11/15/2013  . Dyspepsia 08/24/2013  . Thoracic back pain 07/31/2013  . Abdominal pain, epigastric 07/31/2013  . Routine general medical examination at a health care facility 07/31/2013  . PMB (postmenopausal bleeding) 07/29/2013  . CTS (carpal tunnel syndrome) 07/08/2013  . Polyneuropathy 07/08/2013  . HTN (hypertension), benign 07/04/2013  . Diabetic nephropathy 06/23/2013  . TIA (transient ischemic attack) 04/28/2013  . Chronic kidney disease 04/28/2013  . Pain in joint, shoulder region 04/26/2013  . Muscle weakness (generalized) 04/26/2013  . Tennis elbow syndrome 04/15/2013  . Rotator cuff syndrome of left shoulder 04/15/2013  . Unspecified vitamin D deficiency 03/20/2013  . Onychomycosis of toenail 03/20/2013  . Left arm pain 03/19/2013  . HEADACHE 11/30/2008  . DIABETES MELLITUS, WITH RENAL COMPLICATIONS 79/12/4095  . FATIGUE 10/24/2008  . Gastroparesis 06/16/2008  . Diabetes mellitus, insulin dependent (IDDM), uncontrolled 04/13/2008  . Other and unspecified hyperlipidemia 04/13/2008   . Generalized anxiety disorder 04/13/2008    End of Session Activity Tolerance: Patient tolerated treatment well General Behavior During Therapy: [facility] for tasks assessed/performed   Ailene Ravel, OTR/L,CBIS   04/06/2014, 9:29 AM

## 2014-04-07 ENCOUNTER — Ambulatory Visit (HOSPITAL_COMMUNITY): Payer: BC Managed Care – PPO

## 2014-04-07 MED ORDER — DULOXETINE HCL 60 MG PO CPEP: 60.0000 mg | ORAL_CAPSULE | Freq: Every day | ORAL | Status: DC

## 2014-04-07 NOTE — Telephone Encounter (Signed)
Sent in

## 2014-04-08 ENCOUNTER — Telehealth (HOSPITAL_COMMUNITY): Payer: Self-pay

## 2014-04-08 ENCOUNTER — Ambulatory Visit (HOSPITAL_COMMUNITY): Payer: BC Managed Care – PPO | Admitting: Specialist

## 2014-04-18 ENCOUNTER — Ambulatory Visit (HOSPITAL_COMMUNITY)
Admission: RE | Admit: 2014-04-18 | Discharge: 2014-04-18 | Disposition: A | Discharge status: Home / Self Care | Payer: BC Managed Care – PPO | Source: Ambulatory Visit | Attending: Orthopedic Surgery | Admitting: Orthopedic Surgery

## 2014-04-18 DIAGNOSIS — M6289 Other specified disorders of muscle: Secondary | ICD-10-CM

## 2014-04-18 DIAGNOSIS — E119 Type 2 diabetes mellitus without complications: Secondary | ICD-10-CM | POA: Insufficient documentation

## 2014-04-18 DIAGNOSIS — M25619 Stiffness of unspecified shoulder, not elsewhere classified: Secondary | ICD-10-CM | POA: Insufficient documentation

## 2014-04-18 DIAGNOSIS — M629 Disorder of muscle, unspecified: Secondary | ICD-10-CM

## 2014-04-18 DIAGNOSIS — M25611 Stiffness of right shoulder, not elsewhere classified: Secondary | ICD-10-CM

## 2014-04-18 DIAGNOSIS — IMO0001 Reserved for inherently not codable concepts without codable children: Secondary | ICD-10-CM | POA: Insufficient documentation

## 2014-04-18 DIAGNOSIS — I129 Hypertensive chronic kidney disease with stage 1 through stage 4 chronic kidney disease, or unspecified chronic kidney disease: Secondary | ICD-10-CM | POA: Insufficient documentation

## 2014-04-18 DIAGNOSIS — Z794 Long term (current) use of insulin: Secondary | ICD-10-CM | POA: Insufficient documentation

## 2014-04-18 DIAGNOSIS — N189 Chronic kidney disease, unspecified: Secondary | ICD-10-CM | POA: Insufficient documentation

## 2014-04-18 DIAGNOSIS — M25519 Pain in unspecified shoulder: Secondary | ICD-10-CM | POA: Insufficient documentation

## 2014-04-18 DIAGNOSIS — M6281 Muscle weakness (generalized): Secondary | ICD-10-CM

## 2014-04-18 NOTE — Evaluation (Signed)
Occupational Therapy Reassessment  Patient Details  Name: Ashley Armstrong MRN: [id number] Date of Birth: [birth year]  Today's Date: 04/18/2014 Time: 0017-4944 OT Time Calculation (min): 44 min MFR 840-915 25' Reassess 915-934 19'  Visit#: 18 of 21  Re-eval: 05/16/14  Assessment Diagnosis: Right and Left Shoulder Impingement Syndrome  Authorization:    Authorization Time Period:    Authorization Visit#:   of     Past Medical History:  Past Medical History  Diagnosis Date  . Nicotine addiction   . Anxiety   . IDDM (insulin dependent diabetes mellitus)   . Dyslipidemia   . Headache(784.0)   . TIA (transient ischemic attack)     Dr. Merlene Laughter  . Gastroparesis due to DM OCT 2014    75% AT 2 HRS, GLU >    Past Surgical History:  Past Surgical History  Procedure Laterality Date  . Laser surgery bilateral eye    . Esophagogastroduodenoscopy N/A 08/31/2013    Procedure: ESOPHAGOGASTRODUODENOSCOPY (EGD);  Surgeon: Danie Binder, MD;  Location: [facility];  Service: Endoscopy;  Laterality: N/A;  11:15    Subjective Symptoms/Limitations Symptoms: S: I saw Dr. Mardelle Matte and he said this is something I'm going to have to live with.  Pain Assessment Currently in Pain?: Yes Pain Score: 4  Pain Location: Shoulder Pain Orientation: Right;Left Pain Type: Chronic pain  Precautions/Restrictions  Precautions Precautions: None   Assessment  Additional Assessments RUE AROM (degrees) RUE Overall AROM Comments: Assessed seated. ER/IR adducted Right Shoulder Flexion: 139 Degrees (Last progress note: 132 ) Right Shoulder ABduction: 140 Degrees (last progress note: 132) Right Shoulder Internal Rotation: 90 Degrees (last progress note: 81) Right Shoulder External Rotation: 40 Degrees (last progress note: 22) RUE Strength Right Shoulder Flexion: 3+/5 (same last progress note) Right Shoulder ABduction:  (4-/5) Right Shoulder Internal Rotation: 3+/5 Right Shoulder External Rotation:   (4-/5) LUE AROM (degrees) LUE Overall AROM Comments: assessed seated. ER/IR adducted Left Shoulder Flexion: 133 Degrees (last progress note:121) Left Shoulder ABduction: 135 Degrees (last progress note: 115) Left Shoulder Internal Rotation: 105 Degrees Left Shoulder External Rotation: 25 Degrees (last progress note: 23) LUE Strength Left Shoulder Flexion: 4/5 (same at last progress note) Left Shoulder ABduction: 4/5 (same at last progress note) Left Shoulder Internal Rotation: 4/5 (same at last progress note) Left Shoulder External Rotation: 4/5 (same at last progress note) Palpation Palpation: trace fascial restrictions in bilateral upper arms, trapezius, and scapularis regions.      Exercise/Treatments  Manual Therapy Manual Therapy: Myofascial release Myofascial Release: Myofascial release (MFR) and manual stretching to right and left upper arm, shoulder, and scapular regions to decrease pain and fascial restrictions and improve pain free mobility. Manual cervical traction and MFR to cervical region. (Perform PRN)  Occupational Therapy Assessment and Plan OT Assessment and Plan Clinical Impression Statement: A: Reassessment completed this date. Patient has met all STGs and 2/7 LTGs. Patient has increased AROM measurements and has remained the same with strength. Recommend continueing for 4 more weeks to focus on strengthening.  OT Frequency: Min 2X/week OT Duration: 4 weeks OT Plan: A: Complete myofascial release PRN to allow for more time to complete strengthening exercises. Add Wall wash, ball on the wall. Attempt 2# hand weight with supine.   Goals Short Term Goals Time to Complete Short Term Goals: 4 weeks Short Term Goal 1: Pt will be educated on HEP Short Term Goal 2: Pt will attain right shoulder PROM to WNL in order to facilitate improved reaching towards  her head for bathing. Short Term Goal 2 Progress: Met Short Term Goal 3: Pt will have pain of less than 4/10 in her  right shoulder while driving her car, 5/5 instances. Short Term Goal 4: Pt will decrease fascial restrictions to min-mod overall in right shoulder region to facilitate pain-free movement. Short Term Goal 5: Patient will attain left shoulder PROM to WNL in order to facilitate improved reaching towards her head for bathing.  Short Term Goal 5 Progress: Met Long Term Goals Time to Complete Long Term Goals: 8 weeks Long Term Goal 1: Pt will achieve highest level of functioning with all ADL, IADL, and leisure tasks. Long Term Goal 1 Progress: Progressing toward goal Long Term Goal 2: Pt will achieve right shoulder AROM to WNL in order to don her sports bra. Long Term Goal 2 Progress: Progressing toward goal Long Term Goal 3: Pt will have Bilateral shoulder strength of at least grossly 4+/5 in order to wash her back.  Long Term Goal 3 Progress: Progressing toward goal Long Term Goal 4: Pt will have pain of less than 2/10 in her right shoulder while changing gears in her car, 5/5 instances of town (stop/go) driving trips. Long Term Goal 4 Progress: Progressing toward goal Long Term Goal 5: Pt will have trace fascial restrictions overall in right shoulder region to facilitate pain free movement Long Term Goal 5 Progress: Met Long Term Goal 6: Pt will verbalize/demonstrate ability to brush her hair with complaint of 1/10 pain or less and minimla noted ROM compensatory techniques. Long Term Goal 6 Progress: Progressing toward goal Long Term Goal 7: patient will achieve left shoulder AROM to WNL in order to don her bra.  Long Term Goal 7 Progress: Met  Problem List Patient Active Problem List   Diagnosis Date Noted  . Decreased range of motion of right shoulder 01/17/2014  . Tight fascia 01/17/2014  . Carpal tunnel syndrome 12/07/2013  . Neck pain 12/07/2013  . Degenerative disc disease, cervical 12/07/2013  . Acute cervical adenitis 11/15/2013  . Left serous otitis media 11/15/2013  . Acute  pharyngitis 11/15/2013  . Dyspepsia 08/24/2013  . Thoracic back pain 07/31/2013  . Abdominal pain, epigastric 07/31/2013  . Routine general medical examination at a health care facility 07/31/2013  . PMB (postmenopausal bleeding) 07/29/2013  . CTS (carpal tunnel syndrome) 07/08/2013  . Polyneuropathy 07/08/2013  . HTN (hypertension), benign 07/04/2013  . Diabetic nephropathy 06/23/2013  . TIA (transient ischemic attack) 04/28/2013  . Chronic kidney disease 04/28/2013  . Pain in joint, shoulder region 04/26/2013  . Muscle weakness (generalized) 04/26/2013  . Tennis elbow syndrome 04/15/2013  . Rotator cuff syndrome of left shoulder 04/15/2013  . Unspecified vitamin D deficiency 03/20/2013  . Onychomycosis of toenail 03/20/2013  . Left arm pain 03/19/2013  . HEADACHE 11/30/2008  . DIABETES MELLITUS, WITH RENAL COMPLICATIONS 78/93/8101  . FATIGUE 10/24/2008  . Gastroparesis 06/16/2008  . Diabetes mellitus, insulin dependent (IDDM), uncontrolled 04/13/2008  . Other and unspecified hyperlipidemia 04/13/2008  . Generalized anxiety disorder 04/13/2008    End of Session Activity Tolerance: Patient tolerated treatment well General Behavior During Therapy: [facility] for tasks assessed/performed   Ailene Ravel, OTR/L,CBIS   04/18/2014, 9:58 AM  Physician Documentation Your signature is required to indicate approval of the treatment plan as stated above.  Please sign and either send electronically or make a copy of this report for your files and return this physician signed original.  Please mark one 1.__approve of plan  2.  ___approve of plan with the following conditions.   ______________________________                                                          _____________________ Physician Signature                                                                                                             Date

## 2014-04-20 ENCOUNTER — Ambulatory Visit (HOSPITAL_COMMUNITY): Payer: BC Managed Care – PPO

## 2014-04-20 ENCOUNTER — Telehealth (HOSPITAL_COMMUNITY): Payer: Self-pay

## 2014-04-21 ENCOUNTER — Telehealth: Payer: Self-pay | Admitting: Gastroenterology

## 2014-04-21 ENCOUNTER — Encounter: Payer: BC Managed Care – PPO | Admitting: Gastroenterology

## 2014-04-21 NOTE — Telephone Encounter (Signed)
Pt was a no show

## 2014-04-21 NOTE — Progress Notes (Signed)
   Subjective:    Patient ID: Ashley Armstrong, female    DOB: 12-02-1965, 48 y.o.   MRN: [id number]  HPI  Past Medical History  Diagnosis Date  . Nicotine addiction   . Anxiety   . IDDM (insulin dependent diabetes mellitus)   . Dyslipidemia   . Headache(784.0)   . TIA (transient ischemic attack)     Dr. Merlene Laughter  . Gastroparesis due to DM OCT 2014    75% AT 2 HRS, GLU >      Review of Systems     Objective:   Physical Exam        Assessment & Plan:

## 2014-04-24 ENCOUNTER — Encounter: Payer: Self-pay | Admitting: Gastroenterology

## 2014-04-25 ENCOUNTER — Ambulatory Visit (HOSPITAL_COMMUNITY): Payer: BC Managed Care – PPO

## 2014-04-25 ENCOUNTER — Telehealth (HOSPITAL_COMMUNITY): Payer: Self-pay

## 2014-04-26 NOTE — Telephone Encounter (Signed)
Notify pcp/

## 2014-04-27 ENCOUNTER — Ambulatory Visit (HOSPITAL_COMMUNITY)
Admission: RE | Admit: 2014-04-27 | Discharge: 2014-04-27 | Disposition: A | Discharge status: Home / Self Care | Payer: BC Managed Care – PPO | Source: Ambulatory Visit | Attending: Family Medicine | Admitting: Family Medicine

## 2014-04-27 DIAGNOSIS — M6281 Muscle weakness (generalized): Secondary | ICD-10-CM

## 2014-04-27 DIAGNOSIS — M6289 Other specified disorders of muscle: Secondary | ICD-10-CM

## 2014-04-27 DIAGNOSIS — M25611 Stiffness of right shoulder, not elsewhere classified: Secondary | ICD-10-CM

## 2014-04-27 DIAGNOSIS — M629 Disorder of muscle, unspecified: Secondary | ICD-10-CM

## 2014-04-27 DIAGNOSIS — M25519 Pain in unspecified shoulder: Secondary | ICD-10-CM

## 2014-04-27 NOTE — Progress Notes (Signed)
Occupational Therapy Treatment Patient Details  Name: Ashley Armstrong MRN: [id number] Date of Birth: [birth year]  Today's Date: 04/27/2014 Time: 4765-4650 OT Time Calculation (min): 39 min Therex [phone] 81'  Visit#: 50 of 29  Re-eval: 05/16/14     Subjective Symptoms/Limitations Symptoms: S: I had so many doctor's appointments this past week. I couldn't make it to therapy.  Pain Assessment Currently in Pain?: Yes Pain Score: 4  Pain Location: Shoulder Pain Orientation: Left;Right Pain Type: Chronic pain  Precautions/Restrictions  Precautions Precautions: None  Exercise/Treatments Supine Protraction: Strengthening;10 reps Protraction Weight (lbs): 2 Horizontal ABduction: Strengthening;10 reps Horizontal ABduction Weight (lbs): 2 External Rotation: Strengthening;10 reps External Rotation Weight (lbs): 2 Internal Rotation: Strengthening;10 reps Internal Rotation Weight (lbs): 2 Flexion: Strengthening;10 reps Shoulder Flexion Weight (lbs): 2 ABduction: Strengthening;10 reps Shoulder ABduction Weight (lbs): 2 Standing Protraction: Strengthening;10 reps Protraction Weight (lbs): 2 Horizontal ABduction: Strengthening;10 reps Horizontal ABduction Weight (lbs): 2 External Rotation: Strengthening;10 reps External Rotation Weight (lbs): 2 Internal Rotation: Strengthening;10 reps Internal Rotation Weight (lbs): 2 Flexion: Strengthening;10 reps Shoulder Flexion Weight (lbs): 2 ABduction: Strengthening;10 reps Shoulder ABduction Weight (lbs): 2 Extension: Theraband;15 reps Theraband Level (Shoulder Extension): Level 2 (Red) Row: Theraband;15 reps Theraband Level (Shoulder Row): Level 2 (Red) Retraction: Theraband;15 reps Theraband Level (Shoulder Retraction): Level 2 (Red) ROM / Strengthening / Isometric Strengthening UBE (Upper Arm Bike): Level 2 3' forward 3' reverse Wall Wash: 1' both arms X to V Arms: 10X with 2# Proximal Shoulder Strengthening, Supine: 10X with  2# Proximal Shoulder Strengthening, Seated: 10X with 2# Ball on Wall: 1' flexion/abduction green          Occupational Therapy Assessment and Plan OT Assessment and Plan Clinical Impression Statement: A: Added 2# handweight supine and seated, wall ball, wall wash. Patient tolerated well stating that it felt good during the ball on the wall.  OT Frequency: Min 2X/week OT Duration: 4 weeks OT Plan: A: Myofascial release PRN. Cont with strengthening exercises and work on increasing shoulder and scapular stability by completing prone exercises.    Goals Short Term Goals Time to Complete Short Term Goals: 4 weeks Short Term Goal 1: Pt will be educated on HEP Short Term Goal 2: Pt will attain right shoulder PROM to WNL in order to facilitate improved reaching towards her head for bathing. Short Term Goal 3: Pt will have pain of less than 4/10 in her right shoulder while driving her car, 5/5 instances. Short Term Goal 4: Pt will decrease fascial restrictions to min-mod overall in right shoulder region to facilitate pain-free movement. Short Term Goal 5: Patient will attain left shoulder PROM to WNL in order to facilitate improved reaching towards her head for bathing.  Long Term Goals Time to Complete Long Term Goals: 8 weeks Long Term Goal 1: Pt will achieve highest level of functioning with all ADL, IADL, and leisure tasks. Long Term Goal 1 Progress: Progressing toward goal Long Term Goal 2: Pt will achieve right shoulder AROM to WNL in order to don her sports bra. Long Term Goal 2 Progress: Progressing toward goal Long Term Goal 3: Pt will have Bilateral shoulder strength of at least grossly 4+/5 in order to wash her back.  Long Term Goal 3 Progress: Progressing toward goal Long Term Goal 4: Pt will have pain of less than 2/10 in her right shoulder while changing gears in her car, 5/5 instances of town (stop/go) driving trips. Long Term Goal 4 Progress: Progressing toward goal Long Term  Goal 5: Pt  will have trace fascial restrictions overall in right shoulder region to facilitate pain free movement Long Term Goal 6: Pt will verbalize/demonstrate ability to brush her hair with complaint of 1/10 pain or less and minimla noted ROM compensatory techniques. Long Term Goal 6 Progress: Progressing toward goal Long Term Goal 7: patient will achieve left shoulder AROM to WNL in order to don her bra.   Problem List Patient Active Problem List   Diagnosis Date Noted  . Decreased range of motion of right shoulder 01/17/2014  . Tight fascia 01/17/2014  . Carpal tunnel syndrome 12/07/2013  . Neck pain 12/07/2013  . Degenerative disc disease, cervical 12/07/2013  . Acute cervical adenitis 11/15/2013  . Left serous otitis media 11/15/2013  . Acute pharyngitis 11/15/2013  . Dyspepsia 08/24/2013  . Thoracic back pain 07/31/2013  . Abdominal pain, epigastric 07/31/2013  . Routine general medical examination at a health care facility 07/31/2013  . PMB (postmenopausal bleeding) 07/29/2013  . CTS (carpal tunnel syndrome) 07/08/2013  . Polyneuropathy 07/08/2013  . HTN (hypertension), benign 07/04/2013  . Diabetic nephropathy 06/23/2013  . TIA (transient ischemic attack) 04/28/2013  . Chronic kidney disease 04/28/2013  . Pain in joint, shoulder region 04/26/2013  . Muscle weakness (generalized) 04/26/2013  . Tennis elbow syndrome 04/15/2013  . Rotator cuff syndrome of left shoulder 04/15/2013  . Unspecified vitamin D deficiency 03/20/2013  . Onychomycosis of toenail 03/20/2013  . Left arm pain 03/19/2013  . HEADACHE 11/30/2008  . DIABETES MELLITUS, WITH RENAL COMPLICATIONS 33/29/5188  . FATIGUE 10/24/2008  . Gastroparesis 06/16/2008  . Diabetes mellitus, insulin dependent (IDDM), uncontrolled 04/13/2008  . Other and unspecified hyperlipidemia 04/13/2008  . Generalized anxiety disorder 04/13/2008    End of Session Activity Tolerance: Patient tolerated treatment  well General Behavior During Therapy: [facility] for tasks assessed/performed   Ailene Ravel, OTR/L,CBIS   04/27/2014, 9:22 AM

## 2014-05-02 ENCOUNTER — Ambulatory Visit (HOSPITAL_COMMUNITY)
Admission: RE | Admit: 2014-05-02 | Discharge: 2014-05-02 | Disposition: A | Discharge status: Home / Self Care | Payer: BC Managed Care – PPO | Source: Ambulatory Visit | Attending: Family Medicine | Admitting: Family Medicine

## 2014-05-02 DIAGNOSIS — M25611 Stiffness of right shoulder, not elsewhere classified: Secondary | ICD-10-CM

## 2014-05-02 DIAGNOSIS — M25511 Pain in right shoulder: Secondary | ICD-10-CM

## 2014-05-02 DIAGNOSIS — M629 Disorder of muscle, unspecified: Secondary | ICD-10-CM

## 2014-05-02 DIAGNOSIS — M6281 Muscle weakness (generalized): Secondary | ICD-10-CM

## 2014-05-02 DIAGNOSIS — M6289 Other specified disorders of muscle: Secondary | ICD-10-CM

## 2014-05-02 NOTE — Progress Notes (Signed)
Occupational Therapy Treatment Patient Details  Name: Ashley Armstrong MRN: [id number] Date of Birth: [birth year]  Today's Date: 05/02/2014 Time: 7673-4193 OT Time Calculation (min): 51 min Manual therapy 790-240 14' Therapeutic exercises [phone] 37' Visit#: 20 of 29  Re-eval: 05/16/14    Authorization: [employer] (Disability/Workers Comp Pending)  Authorization Time Period:    Authorization Visit#: 20 of    Subjective S:  Im trying to do the exercises 2 times a day. Pain Assessment Pain Score: 3  Pain Location: Shoulder Pain Orientation: Left;Right Pain Type: Chronic pain  Precautions/Restrictions   progress as tolerated  Exercise/Treatments Supine Protraction: Strengthening;12 reps Protraction Weight (lbs): 2 Horizontal ABduction: Strengthening;12 reps Horizontal ABduction Weight (lbs): 2 External Rotation: Strengthening;12 reps External Rotation Weight (lbs): 2 Internal Rotation: Strengthening;12 reps Internal Rotation Weight (lbs): 2 Flexion: Strengthening;12 reps Shoulder Flexion Weight (lbs): 2 ABduction: Strengthening;12 reps Shoulder ABduction Weight (lbs): 2 Prone  Retraction: AROM;10 reps Extension: AROM;10 reps External Rotation: AROM;10 reps Internal Rotation: AROM;10 reps Horizontal ABduction 1: AROM;10 reps Horizontal ABduction 2: AROM;10 reps Standing Protraction: Strengthening;12 reps Protraction Weight (lbs): 2 Horizontal ABduction: Strengthening;12 reps Horizontal ABduction Weight (lbs): 2 External Rotation: Strengthening;12 reps External Rotation Weight (lbs): 2 Internal Rotation: Strengthening;12 reps Internal Rotation Weight (lbs): 2 Flexion: Strengthening;12 reps Shoulder Flexion Weight (lbs): 2 ABduction: Strengthening;12 reps Shoulder ABduction Weight (lbs): 2 ROM / Strengthening / Isometric Strengthening UBE (Upper Arm Bike): Level 2 3' forward and 3' reverse Wall Wash: 2' with 2# with right arm Over Head Lace: 2' with 2#  on wrist X to V Arms: 10 times with 2# Proximal Shoulder Strengthening, Supine: 10 times with 2# Proximal Shoulder Strengthening, Seated: 10 times with 2#      Manual Therapy Manual Therapy: Myofascial release  Occupational Therapy Assessment and Plan OT Assessment and Plan Clinical Impression Statement: A:  Added prone AROM in standing.  Added 2# to wall wash and overhead lacing. OT Plan: P:  Improve ER/IR for functional activiites.     Goals Short Term Goals Time to Complete Short Term Goals: 4 weeks Short Term Goal 1: Pt will be educated on HEP Short Term Goal 2: Pt will attain right shoulder PROM to WNL in order to facilitate improved reaching towards her head for bathing. Short Term Goal 3: Pt will have pain of less than 4/10 in her right shoulder while driving her car, 5/5 instances. Short Term Goal 4: Pt will decrease fascial restrictions to min-mod overall in right shoulder region to facilitate pain-free movement. Short Term Goal 5: Patient will attain left shoulder PROM to WNL in order to facilitate improved reaching towards her head for bathing.  Long Term Goals Time to Complete Long Term Goals: 8 weeks Long Term Goal 1: Pt will achieve highest level of functioning with all ADL, IADL, and leisure tasks. Long Term Goal 1 Progress: Progressing toward goal Long Term Goal 2: Pt will achieve right shoulder AROM to WNL in order to don her sports bra. Long Term Goal 2 Progress: Progressing toward goal Long Term Goal 3: Pt will have Bilateral shoulder strength of at least grossly 4+/5 in order to wash her back.  Long Term Goal 3 Progress: Progressing toward goal Long Term Goal 4: Pt will have pain of less than 2/10 in her right shoulder while changing gears in her car, 5/5 instances of town (stop/go) driving trips. Long Term Goal 4 Progress: Progressing toward goal Long Term Goal 5: Pt will have trace fascial restrictions overall in right shoulder  region to facilitate pain free  movement Long Term Goal 6: Pt will verbalize/demonstrate ability to brush her hair with complaint of 1/10 pain or less and minimla noted ROM compensatory techniques. Long Term Goal 6 Progress: Progressing toward goal Long Term Goal 7: patient will achieve left shoulder AROM to WNL in order to don her bra.  Long Term Goal 7 Progress: Progressing toward goal  Problem List Patient Active Problem List   Diagnosis Date Noted  . Decreased range of motion of right shoulder 01/17/2014  . Tight fascia 01/17/2014  . Carpal tunnel syndrome 12/07/2013  . Neck pain 12/07/2013  . Degenerative disc disease, cervical 12/07/2013  . Acute cervical adenitis 11/15/2013  . Left serous otitis media 11/15/2013  . Acute pharyngitis 11/15/2013  . Dyspepsia 08/24/2013  . Thoracic back pain 07/31/2013  . Abdominal pain, epigastric 07/31/2013  . Routine general medical examination at a health care facility 07/31/2013  . PMB (postmenopausal bleeding) 07/29/2013  . CTS (carpal tunnel syndrome) 07/08/2013  . Polyneuropathy 07/08/2013  . HTN (hypertension), benign 07/04/2013  . Diabetic nephropathy 06/23/2013  . TIA (transient ischemic attack) 04/28/2013  . Chronic kidney disease 04/28/2013  . Pain in joint, shoulder region 04/26/2013  . Muscle weakness (generalized) 04/26/2013  . Tennis elbow syndrome 04/15/2013  . Rotator cuff syndrome of left shoulder 04/15/2013  . Unspecified vitamin D deficiency 03/20/2013  . Onychomycosis of toenail 03/20/2013  . Left arm pain 03/19/2013  . HEADACHE 11/30/2008  . DIABETES MELLITUS, WITH RENAL COMPLICATIONS 37/90/2409  . FATIGUE 10/24/2008  . Gastroparesis 06/16/2008  . Diabetes mellitus, insulin dependent (IDDM), uncontrolled 04/13/2008  . Other and unspecified hyperlipidemia 04/13/2008  . Generalized anxiety disorder 04/13/2008    End of Session Activity Tolerance: Patient tolerated treatment well General Behavior During Therapy: [facility] for tasks  assessed/performed  [place], OTR/L [phone]  05/02/2014, 9:34 AM

## 2014-05-04 ENCOUNTER — Ambulatory Visit (HOSPITAL_COMMUNITY)
Admission: RE | Admit: 2014-05-04 | Discharge: 2014-05-04 | Disposition: A | Discharge status: Home / Self Care | Payer: BC Managed Care – PPO | Source: Ambulatory Visit | Attending: Family Medicine | Admitting: Family Medicine

## 2014-05-04 ENCOUNTER — Encounter: Payer: Self-pay | Admitting: Gastroenterology

## 2014-05-04 DIAGNOSIS — M6289 Other specified disorders of muscle: Secondary | ICD-10-CM

## 2014-05-04 DIAGNOSIS — M6281 Muscle weakness (generalized): Secondary | ICD-10-CM

## 2014-05-04 DIAGNOSIS — M25511 Pain in right shoulder: Secondary | ICD-10-CM

## 2014-05-04 DIAGNOSIS — M25611 Stiffness of right shoulder, not elsewhere classified: Secondary | ICD-10-CM

## 2014-05-04 DIAGNOSIS — M629 Disorder of muscle, unspecified: Secondary | ICD-10-CM

## 2014-05-04 NOTE — Progress Notes (Signed)
Occupational Therapy Treatment Patient Details  Name: Ashley Armstrong MRN: [id number] Date of Birth: Jan 04, 1966  Today's Date: 05/04/2014 Time: 9476-5465 OT Time Calculation (min): 45 min MFR [phone] 19' Therex 904-930  26'  Visit#: 21 of 29  Re-eval: 05/16/14    Authorization: [employer] (Disability/Workers Comp Pending)  Authorization Time Period:    Authorization Visit#: 21 of    Subjective Symptoms/Limitations Symptoms: S: Last night I was hurting a lot. I did my exercises before I went to bed and it helped.  Pain Assessment Currently in Pain?: Yes Pain Score: 3  Pain Location: Shoulder Pain Orientation: Right;Left Pain Type: Chronic pain  Precautions/Restrictions  Precautions Precautions: None  Exercise/Treatments Supine Protraction: Strengthening;12 reps Protraction Weight (lbs): 2 Horizontal ABduction: Strengthening;12 reps Horizontal ABduction Weight (lbs): 2 External Rotation: Strengthening;12 reps External Rotation Weight (lbs): 2 Internal Rotation: Strengthening;12 reps Internal Rotation Weight (lbs): 2 Flexion: Strengthening;12 reps Shoulder Flexion Weight (lbs): 2 ABduction: Strengthening;12 reps Shoulder ABduction Weight (lbs): 2 Prone  Retraction: Strengthening;Weights;12 reps Retraction Weight (lbs): 1 Flexion: Strengthening;Weights;12 reps Flexion Weight (lbs): 1 Extension: Strengthening;Weights;12 reps Extension Weight (lbs): 1 External Rotation: Strengthening;Weights;12 reps External Rotation Weight (lbs): 1 Internal Rotation: Strengthening;Weights;12 reps Internal Rotation Weight (lbs): 1 Horizontal ABduction 1: Strengthening;Weights;12 reps Horizontal ABduction 1 Weight (lbs): 1 Horizontal ABduction 2: Strengthening;Weights;12 reps Horizontal ABduction 2 Weight (lbs): 1 ROM / Strengthening / Isometric Strengthening Wall Wash: 2' with 2# bilateral arms Over Head Lace: 2' with 2# on wrist X to V Arms: 12X with 2# Proximal  Shoulder Strengthening, Supine: 12X with 2#       Manual Therapy Manual Therapy: Myofascial release Myofascial Release: Myofascial release (MFR) performed to medial border of bilateral scapulas. Patient educated on completing self MFR using a hard ball postioned on the medial border of the scapula while completing shoulder flexion.    Occupational Therapy Assessment and Plan OT Assessment and Plan Clinical Impression Statement: A: patient given strengthening HEP this date. exercises reviewed and patient verbalized understanding. Patient completed IR/ER with shoulder abducted. OT Plan: P: Cont to work on  improving ER/IR for functional activiites.     Goals Short Term Goals Time to Complete Short Term Goals: 4 weeks Short Term Goal 1: Pt will be educated on HEP Short Term Goal 2: Pt will attain right shoulder PROM to WNL in order to facilitate improved reaching towards her head for bathing. Short Term Goal 3: Pt will have pain of less than 4/10 in her right shoulder while driving her car, 5/5 instances. Short Term Goal 4: Pt will decrease fascial restrictions to min-mod overall in right shoulder region to facilitate pain-free movement. Short Term Goal 5: Patient will attain left shoulder PROM to WNL in order to facilitate improved reaching towards her head for bathing.  Long Term Goals Time to Complete Long Term Goals: 8 weeks Long Term Goal 1: Pt will achieve highest level of functioning with all ADL, IADL, and leisure tasks. Long Term Goal 2: Pt will achieve right shoulder AROM to WNL in order to don her sports bra. Long Term Goal 3: Pt will have Bilateral shoulder strength of at least grossly 4+/5 in order to wash her back.  Long Term Goal 4: Pt will have pain of less than 2/10 in her right shoulder while changing gears in her car, 5/5 instances of town (stop/go) driving trips. Long Term Goal 5: Pt will have trace fascial restrictions overall in right shoulder region to facilitate pain  free movement Long Term Goal  6: Pt will verbalize/demonstrate ability to brush her hair with complaint of 1/10 pain or less and minimla noted ROM compensatory techniques. Long Term Goal 7: patient will achieve left shoulder AROM to WNL in order to don her bra.   Problem List Patient Active Problem List   Diagnosis Date Noted  . Decreased range of motion of right shoulder 01/17/2014  . Tight fascia 01/17/2014  . Carpal tunnel syndrome 12/07/2013  . Neck pain 12/07/2013  . Degenerative disc disease, cervical 12/07/2013  . Acute cervical adenitis 11/15/2013  . Left serous otitis media 11/15/2013  . Acute pharyngitis 11/15/2013  . Dyspepsia 08/24/2013  . Thoracic back pain 07/31/2013  . Abdominal pain, epigastric 07/31/2013  . Routine general medical examination at a health care facility 07/31/2013  . PMB (postmenopausal bleeding) 07/29/2013  . CTS (carpal tunnel syndrome) 07/08/2013  . Polyneuropathy 07/08/2013  . HTN (hypertension), benign 07/04/2013  . Diabetic nephropathy 06/23/2013  . TIA (transient ischemic attack) 04/28/2013  . Chronic kidney disease 04/28/2013  . Pain in joint, shoulder region 04/26/2013  . Muscle weakness (generalized) 04/26/2013  . Tennis elbow syndrome 04/15/2013  . Rotator cuff syndrome of left shoulder 04/15/2013  . Unspecified vitamin D deficiency 03/20/2013  . Onychomycosis of toenail 03/20/2013  . Left arm pain 03/19/2013  . HEADACHE 11/30/2008  . DIABETES MELLITUS, WITH RENAL COMPLICATIONS 48/25/0037  . FATIGUE 10/24/2008  . Gastroparesis 06/16/2008  . Diabetes mellitus, insulin dependent (IDDM), uncontrolled 04/13/2008  . Other and unspecified hyperlipidemia 04/13/2008  . Generalized anxiety disorder 04/13/2008    End of Session Activity Tolerance: Patient tolerated treatment well General Behavior During Therapy: [facility] for tasks assessed/performed OT Plan of Care OT Home Exercise Plan: Strengthening exercises OT Patient Instructions:  handout (scanned) Consulted and Agree with Plan of Care: Patient   Ailene Ravel, OTR/L,CBIS   05/04/2014, 12:19 PM

## 2014-05-04 NOTE — Telephone Encounter (Signed)
Mailed letter and PCP aware

## 2014-05-09 ENCOUNTER — Ambulatory Visit (HOSPITAL_COMMUNITY): Payer: BC Managed Care – PPO

## 2014-05-09 ENCOUNTER — Telehealth (HOSPITAL_COMMUNITY): Payer: Self-pay

## 2014-05-11 ENCOUNTER — Ambulatory Visit (HOSPITAL_COMMUNITY)
Admission: RE | Admit: 2014-05-11 | Discharge: 2014-05-11 | Disposition: A | Discharge status: Home / Self Care | Payer: BC Managed Care – PPO | Source: Ambulatory Visit | Attending: Orthopedic Surgery | Admitting: Orthopedic Surgery

## 2014-05-11 DIAGNOSIS — Z794 Long term (current) use of insulin: Secondary | ICD-10-CM | POA: Insufficient documentation

## 2014-05-11 DIAGNOSIS — I129 Hypertensive chronic kidney disease with stage 1 through stage 4 chronic kidney disease, or unspecified chronic kidney disease: Secondary | ICD-10-CM | POA: Insufficient documentation

## 2014-05-11 DIAGNOSIS — M6289 Other specified disorders of muscle: Secondary | ICD-10-CM

## 2014-05-11 DIAGNOSIS — IMO0001 Reserved for inherently not codable concepts without codable children: Secondary | ICD-10-CM | POA: Insufficient documentation

## 2014-05-11 DIAGNOSIS — M25619 Stiffness of unspecified shoulder, not elsewhere classified: Secondary | ICD-10-CM | POA: Insufficient documentation

## 2014-05-11 DIAGNOSIS — N189 Chronic kidney disease, unspecified: Secondary | ICD-10-CM | POA: Insufficient documentation

## 2014-05-11 DIAGNOSIS — M25611 Stiffness of right shoulder, not elsewhere classified: Secondary | ICD-10-CM

## 2014-05-11 DIAGNOSIS — M629 Disorder of muscle, unspecified: Secondary | ICD-10-CM

## 2014-05-11 DIAGNOSIS — M25519 Pain in unspecified shoulder: Secondary | ICD-10-CM | POA: Insufficient documentation

## 2014-05-11 DIAGNOSIS — M6281 Muscle weakness (generalized): Secondary | ICD-10-CM

## 2014-05-11 DIAGNOSIS — E119 Type 2 diabetes mellitus without complications: Secondary | ICD-10-CM | POA: Insufficient documentation

## 2014-05-11 NOTE — Progress Notes (Signed)
Occupational Therapy Treatment Patient Details  Name: Ashley Armstrong MRN: [id number] Date of Birth: 04-17-1966  Today's Date: 05/11/2014 Time: 0850-0930 OT Time Calculation (min): 40 min Therex 850-930 24'  Visit#: 13 of 63  Re-eval: 05/16/14    Authorization: [employer] (Disability/Workers Comp Pending)  Authorization Time Period:    Authorization Visit#: 22 of    Subjective Symptoms/Limitations Symptoms: S: I picked up a big box the other day with my arms straight at my hips. It didn't hurt and actually felt good.  Pain Assessment Currently in Pain?: No/denies  Precautions/Restrictions  Precautions Precautions: None  Exercise/Treatments Supine Protraction: Strengthening;12 reps Protraction Weight (lbs): 2 Horizontal ABduction: Strengthening;12 reps Horizontal ABduction Weight (lbs): 2 External Rotation: Strengthening;12 reps External Rotation Weight (lbs): 2 Internal Rotation: Strengthening;12 reps Internal Rotation Weight (lbs): 2 Flexion: Strengthening;12 reps Shoulder Flexion Weight (lbs): 2 ABduction: Strengthening;12 reps Shoulder ABduction Weight (lbs): 2 Prone  Retraction: Strengthening;Weights;12 reps Retraction Weight (lbs): 2 Flexion: Strengthening;Weights;12 reps Flexion Weight (lbs): 2 Horizontal ABduction 1: Strengthening;Weights;12 reps Horizontal ABduction 1 Weight (lbs): 2 Standing Protraction: Strengthening;12 reps Protraction Weight (lbs): 2 External Rotation: Strengthening;12 reps External Rotation Weight (lbs): 2 Internal Rotation: Strengthening;12 reps Internal Rotation Weight (lbs): 2 ABduction: Strengthening;12 reps Shoulder ABduction Weight (lbs): 2 ROM / Strengthening / Isometric Strengthening UBE (Upper Arm Bike): Level 3 3' forward 3' reverse Wall Wash: 2' with 2# bilateral arms Over Head Lace: 2' with 2# on wrist X to V Arms: 12X with 2# Proximal Shoulder Strengthening, Supine: 12X with 2# Proximal Shoulder  Strengthening, Seated: 12X with 2# rest break before circles          Occupational Therapy Assessment and Plan OT Assessment and Plan Clinical Impression Statement: A: Increased UBE bike to Level 3. Patient completed prone exercises standing bent over using 2# handweight. patient tolerated well.  OT Plan: P: Increase UBE to Level 4   Goals Short Term Goals Time to Complete Short Term Goals: 4 weeks Short Term Goal 1: Pt will be educated on HEP Short Term Goal 2: Pt will attain right shoulder PROM to WNL in order to facilitate improved reaching towards her head for bathing. Short Term Goal 3: Pt will have pain of less than 4/10 in her right shoulder while driving her car, 5/5 instances. Short Term Goal 4: Pt will decrease fascial restrictions to min-mod overall in right shoulder region to facilitate pain-free movement. Short Term Goal 5: Patient will attain left shoulder PROM to WNL in order to facilitate improved reaching towards her head for bathing.  Long Term Goals Time to Complete Long Term Goals: 8 weeks Long Term Goal 1: Pt will achieve highest level of functioning with all ADL, IADL, and leisure tasks. Long Term Goal 1 Progress: Progressing toward goal Long Term Goal 2: Pt will achieve right shoulder AROM to WNL in order to don her sports bra. Long Term Goal 2 Progress: Progressing toward goal Long Term Goal 3: Pt will have Bilateral shoulder strength of at least grossly 4+/5 in order to wash her back.  Long Term Goal 3 Progress: Progressing toward goal Long Term Goal 4: Pt will have pain of less than 2/10 in her right shoulder while changing gears in her car, 5/5 instances of town (stop/go) driving trips. Long Term Goal 4 Progress: Progressing toward goal Long Term Goal 5: Pt will have trace fascial restrictions overall in right shoulder region to facilitate pain free movement Long Term Goal 6: Pt will verbalize/demonstrate ability to brush her hair  with complaint of 1/10 pain  or less and minimla noted ROM compensatory techniques. Long Term Goal 6 Progress: Progressing toward goal Long Term Goal 7: patient will achieve left shoulder AROM to WNL in order to don her bra.  Long Term Goal 7 Progress: Progressing toward goal  Problem List Patient Active Problem List   Diagnosis Date Noted  . Decreased range of motion of right shoulder 01/17/2014  . Tight fascia 01/17/2014  . Carpal tunnel syndrome 12/07/2013  . Neck pain 12/07/2013  . Degenerative disc disease, cervical 12/07/2013  . Acute cervical adenitis 11/15/2013  . Left serous otitis media 11/15/2013  . Acute pharyngitis 11/15/2013  . Dyspepsia 08/24/2013  . Thoracic back pain 07/31/2013  . Abdominal pain, epigastric 07/31/2013  . Routine general medical examination at a health care facility 07/31/2013  . PMB (postmenopausal bleeding) 07/29/2013  . CTS (carpal tunnel syndrome) 07/08/2013  . Polyneuropathy 07/08/2013  . HTN (hypertension), benign 07/04/2013  . Diabetic nephropathy 06/23/2013  . TIA (transient ischemic attack) 04/28/2013  . Chronic kidney disease 04/28/2013  . Pain in joint, shoulder region 04/26/2013  . Muscle weakness (generalized) 04/26/2013  . Tennis elbow syndrome 04/15/2013  . Rotator cuff syndrome of left shoulder 04/15/2013  . Unspecified vitamin D deficiency 03/20/2013  . Onychomycosis of toenail 03/20/2013  . Left arm pain 03/19/2013  . HEADACHE 11/30/2008  . DIABETES MELLITUS, WITH RENAL COMPLICATIONS 42/39/5320  . FATIGUE 10/24/2008  . Gastroparesis 06/16/2008  . Diabetes mellitus, insulin dependent (IDDM), uncontrolled 04/13/2008  . Other and unspecified hyperlipidemia 04/13/2008  . Generalized anxiety disorder 04/13/2008    End of Session Activity Tolerance: Patient tolerated treatment well General Behavior During Therapy: [facility] for tasks assessed/performed   Ailene Ravel, OTR/L,CBIS   05/11/2014, 9:25 AM

## 2014-05-16 ENCOUNTER — Telehealth (HOSPITAL_COMMUNITY): Payer: Self-pay

## 2014-05-16 ENCOUNTER — Ambulatory Visit (HOSPITAL_COMMUNITY): Payer: BC Managed Care – PPO

## 2014-05-18 ENCOUNTER — Ambulatory Visit (HOSPITAL_COMMUNITY)
Admission: RE | Admit: 2014-05-18 | Discharge: 2014-05-18 | Disposition: A | Discharge status: Home / Self Care | Payer: BC Managed Care – PPO | Source: Ambulatory Visit | Attending: Family Medicine | Admitting: Family Medicine

## 2014-05-18 ENCOUNTER — Other Ambulatory Visit: Payer: Self-pay

## 2014-05-18 DIAGNOSIS — M25519 Pain in unspecified shoulder: Secondary | ICD-10-CM

## 2014-05-18 DIAGNOSIS — M6281 Muscle weakness (generalized): Secondary | ICD-10-CM

## 2014-05-18 DIAGNOSIS — M629 Disorder of muscle, unspecified: Secondary | ICD-10-CM

## 2014-05-18 DIAGNOSIS — M25611 Stiffness of right shoulder, not elsewhere classified: Secondary | ICD-10-CM

## 2014-05-18 DIAGNOSIS — M6289 Other specified disorders of muscle: Secondary | ICD-10-CM

## 2014-05-18 DIAGNOSIS — E785 Hyperlipidemia, unspecified: Secondary | ICD-10-CM

## 2014-05-18 MED ORDER — PRAVASTATIN SODIUM 80 MG PO TABS: 80.0000 mg | ORAL_TABLET | Freq: Every evening | ORAL | Status: DC

## 2014-05-18 NOTE — Evaluation (Signed)
Occupational Therapy Reassessment and Discharge  Patient Details  Name: Ashley Armstrong MRN: [id number] Date of Birth: [birth year]  Today's Date: 05/18/2014 Time: 4098-1191 OT Time Calculation (min): 26 min Reassess [phone]  Visit#: 23 of 29  Re-eval: 05/16/14  Assessment Diagnosis: Right and Left Shoulder Impingement Syndrome  Authorization: [employer] (Disability/Workers Comp Pending)  Authorization Time Period:    Authorization Visit#: 55 of     Past Medical History:  Past Medical History  Diagnosis Date  . Nicotine addiction   . Anxiety   . IDDM (insulin dependent diabetes mellitus)   . Dyslipidemia   . Headache(784.0)   . TIA (transient ischemic attack)     Dr. Merlene Laughter  . Gastroparesis due to DM OCT 2014    75% AT 2 HRS, GLU >    Past Surgical History:  Past Surgical History  Procedure Laterality Date  . Laser surgery bilateral eye    . Esophagogastroduodenoscopy N/A 08/31/2013    Procedure: ESOPHAGOGASTRODUODENOSCOPY (EGD);  Surgeon: Danie Binder, MD;  [facility]: [facility];  Service: Endoscopy;  Laterality: N/A;  11:15    Subjective Pain Assessment Currently in Pain?: No/denies  Precautions/Restrictions  Precautions Precautions: None   Assessment Additional Assessments RUE AROM (degrees) RUE Overall AROM Comments: Assessed seated. ER/IR adducted Right Shoulder Flexion: 140 Degrees (last progress note: 139) Right Shoulder ABduction: 142 Degrees (last progress note: 140) Right Shoulder Internal Rotation: 90 Degrees (same) Right Shoulder External Rotation: 45 Degrees (last progress note: 40) RUE Strength Right Shoulder Flexion: 4/5 (last progress note: 3+/5) Right Shoulder ABduction:  (4+/5 (Last progress note: 3+/5)) Right Shoulder Internal Rotation: 3+/5 (same at last progress note) Right Shoulder External Rotation: 3+/5 (last progress note: 4-/5) LUE AROM (degrees) LUE Overall AROM Comments: assessed seated. ER/IR adducted Left  Shoulder Flexion: 136 Degrees (last progress note: 133) Left Shoulder ABduction: 140 Degrees (last progress note: 135) Left Shoulder Internal Rotation: 105 Degrees (last progress note: same) Left Shoulder External Rotation: 26 Degrees (last progress note: 25) LUE Strength Left Shoulder Flexion: 4/5 (same at last progress note) Left Shoulder ABduction: 3+/5 (4+/5 ( last progress note: 4/5)) Left Shoulder Internal Rotation: 3+/5 (last progress note: 4/5) Left Shoulder External Rotation:  (last progress note: 4/5) Palpation Palpation:  (zero fascial restrictions in bilateral upper arm, trapezius)      Occupational Therapy Assessment and Plan OT Assessment and Plan Clinical Impression Statement: A: reassessment and discharge completed this date. patient has made only slight improvement with AROM measurements this date. Strength of BUE is fluctuating. Patient has met  5/5 STGs and 2/7LTGs. Patient is to continue completing HEP independenly at home consiting of theraband, weights and stretching. Patient is agreeable to discharge.  OT Plan: P: D/C from therapy.   Goals Short Term Goals Time to Complete Short Term Goals: 4 weeks Short Term Goal 1: Pt will be educated on HEP Short Term Goal 2: Pt will attain right shoulder PROM to WNL in order to facilitate improved reaching towards her head for bathing. Short Term Goal 3: Pt will have pain of less than 4/10 in her right shoulder while driving her car, 5/5 instances. Short Term Goal 4: Pt will decrease fascial restrictions to min-mod overall in right shoulder region to facilitate pain-free movement. Short Term Goal 5: Patient will attain left shoulder PROM to WNL in order to facilitate improved reaching towards her head for bathing.  Long Term Goals Time to Complete Long Term Goals: 8 weeks Long Term Goal 1: Pt will  achieve highest level of functioning with all ADL, IADL, and leisure tasks. Long Term Goal 1 Progress: Met Long Term Goal 2: Pt  will achieve right shoulder AROM to WNL in order to don her sports bra. Long Term Goal 2 Progress: Not met Long Term Goal 3: Pt will have Bilateral shoulder strength of at least grossly 4+/5 in order to wash her back.  Long Term Goal 3 Progress: Progressing toward goal Long Term Goal 4: Pt will have pain of less than 2/10 in her right shoulder while changing gears in her car, 5/5 instances of town (stop/go) driving trips. Long Term Goal 4 Progress: Met Long Term Goal 5: Pt will have trace fascial restrictions overall in right shoulder region to facilitate pain free movement Long Term Goal 6: Pt will verbalize/demonstrate ability to brush her hair with complaint of 1/10 pain or less and minimla noted ROM compensatory techniques. Long Term Goal 6 Progress: Met Long Term Goal 7: patient will achieve left shoulder AROM to WNL in order to don her bra.  Long Term Goal 7 Progress: Not met  Problem List Patient Active Problem List   Diagnosis Date Noted  . Decreased range of motion of right shoulder 01/17/2014  . Tight fascia 01/17/2014  . Carpal tunnel syndrome 12/07/2013  . Neck pain 12/07/2013  . Degenerative disc disease, cervical 12/07/2013  . Acute cervical adenitis 11/15/2013  . Left serous otitis media 11/15/2013  . Acute pharyngitis 11/15/2013  . Dyspepsia 08/24/2013  . Thoracic back pain 07/31/2013  . Abdominal pain, epigastric 07/31/2013  . Routine general medical examination at a health care facility 07/31/2013  . PMB (postmenopausal bleeding) 07/29/2013  . CTS (carpal tunnel syndrome) 07/08/2013  . Polyneuropathy 07/08/2013  . HTN (hypertension), benign 07/04/2013  . Diabetic nephropathy 06/23/2013  . TIA (transient ischemic attack) 04/28/2013  . Chronic kidney disease 04/28/2013  . Pain in joint, shoulder region 04/26/2013  . Muscle weakness (generalized) 04/26/2013  . Tennis elbow syndrome 04/15/2013  . Rotator cuff syndrome of left shoulder 04/15/2013  . Unspecified  vitamin D deficiency 03/20/2013  . Onychomycosis of toenail 03/20/2013  . Left arm pain 03/19/2013  . HEADACHE 11/30/2008  . DIABETES MELLITUS, WITH RENAL COMPLICATIONS 25/63/8937  . FATIGUE 10/24/2008  . Gastroparesis 06/16/2008  . Diabetes mellitus, insulin dependent (IDDM), uncontrolled 04/13/2008  . Other and unspecified hyperlipidemia 04/13/2008  . Generalized anxiety disorder 04/13/2008    End of Session Activity Tolerance: Patient tolerated treatment well General Behavior During Therapy: [facility] for tasks assessed/performed   Ailene Ravel, OTR/L,CBIS   05/18/2014, 10:19 AM  Physician Documentation Your signature is required to indicate approval of the treatment plan as stated above.  Please sign and either send electronically or make a copy of this report for your files and return this physician signed original.  Please mark one 1.__approve of plan  2. ___approve of plan with the following conditions.   ______________________________                                                          _____________________ Physician Signature  Date  

## 2014-05-24 ENCOUNTER — Ambulatory Visit (HOSPITAL_COMMUNITY): Payer: BC Managed Care – PPO

## 2014-05-26 ENCOUNTER — Ambulatory Visit (HOSPITAL_COMMUNITY): Payer: BC Managed Care – PPO

## 2014-05-26 ENCOUNTER — Telehealth: Payer: Self-pay | Admitting: Family Medicine

## 2014-05-26 DIAGNOSIS — Z794 Long term (current) use of insulin: Principal | ICD-10-CM

## 2014-05-26 DIAGNOSIS — E1165 Type 2 diabetes mellitus with hyperglycemia: Principal | ICD-10-CM

## 2014-05-26 DIAGNOSIS — IMO0001 Reserved for inherently not codable concepts without codable children: Secondary | ICD-10-CM

## 2014-05-26 DIAGNOSIS — E785 Hyperlipidemia, unspecified: Secondary | ICD-10-CM

## 2014-05-26 NOTE — Telephone Encounter (Signed)
Patient will go next week

## 2014-05-27 LAB — HEMOGLOBIN A1C: HEMOGLOBIN-A1C: 10.3

## 2014-05-31 ENCOUNTER — Other Ambulatory Visit: Payer: Self-pay | Admitting: Family Medicine

## 2014-06-01 LAB — LIPID PANEL
Cholesterol: 163 mg/dL (ref 0–200)
HDL: 62 mg/dL (ref >39)
LDL Cholesterol: 80 mg/dL (ref 0–99)
TRIGLYCERIDES: 106 mg/dL (ref <150)
Total CHOL/HDL Ratio: 2.6 Ratio
VLDL: 21 mg/dL (ref 0–40)

## 2014-06-01 LAB — HEMOGLOBIN A1C
HEMOGLOBIN A1C: 9.7 % — AB (ref <5.7)
Mean Plasma Glucose: 232 mg/dL — ABNORMAL HIGH (ref <117)

## 2014-06-13 ENCOUNTER — Other Ambulatory Visit: Payer: Self-pay | Admitting: *Deleted

## 2014-06-13 DIAGNOSIS — M7712 Lateral epicondylitis, left elbow: Secondary | ICD-10-CM

## 2014-06-13 DIAGNOSIS — M75102 Unspecified rotator cuff tear or rupture of left shoulder, not specified as traumatic: Secondary | ICD-10-CM

## 2014-06-13 MED ORDER — TRAMADOL-ACETAMINOPHEN 37.5-325 MG PO TABS: 1.0000 | ORAL_TABLET | ORAL | Status: DC | PRN

## 2014-06-16 ENCOUNTER — Ambulatory Visit (INDEPENDENT_AMBULATORY_CARE_PROVIDER_SITE_OTHER): Payer: BC Managed Care – PPO | Admitting: Family Medicine

## 2014-06-16 ENCOUNTER — Encounter: Payer: Self-pay | Admitting: Family Medicine

## 2014-06-16 VITALS — BP 130/80 | HR 87 | Resp 16 | Ht 67.0 in | Wt 115.0 lb

## 2014-06-16 DIAGNOSIS — E1165 Type 2 diabetes mellitus with hyperglycemia: Principal | ICD-10-CM

## 2014-06-16 DIAGNOSIS — I10 Essential (primary) hypertension: Secondary | ICD-10-CM

## 2014-06-16 DIAGNOSIS — M25611 Stiffness of right shoulder, not elsewhere classified: Secondary | ICD-10-CM

## 2014-06-16 DIAGNOSIS — M259 Joint disorder, unspecified: Secondary | ICD-10-CM

## 2014-06-16 DIAGNOSIS — Z23 Encounter for immunization: Secondary | ICD-10-CM

## 2014-06-16 DIAGNOSIS — F411 Generalized anxiety disorder: Secondary | ICD-10-CM

## 2014-06-16 DIAGNOSIS — Z794 Long term (current) use of insulin: Principal | ICD-10-CM

## 2014-06-16 DIAGNOSIS — E1065 Type 1 diabetes mellitus with hyperglycemia: Secondary | ICD-10-CM

## 2014-06-16 DIAGNOSIS — E785 Hyperlipidemia, unspecified: Secondary | ICD-10-CM

## 2014-06-16 DIAGNOSIS — IMO0001 Reserved for inherently not codable concepts without codable children: Secondary | ICD-10-CM

## 2014-06-16 DIAGNOSIS — F341 Dysthymic disorder: Secondary | ICD-10-CM

## 2014-06-16 DIAGNOSIS — IMO0002 Reserved for concepts with insufficient information to code with codable children: Secondary | ICD-10-CM

## 2014-06-16 DIAGNOSIS — F418 Other specified anxiety disorders: Secondary | ICD-10-CM

## 2014-06-16 NOTE — Patient Instructions (Addendum)
F/u in 4 month, call if you need me before  TdAp today.  You need to try to see Dr Dorris Fetch sooner than 07/31/2014, pls try and schedule a sooner appt  Cholesterol is excellent and blood pressure also, blood sugar is uncontrolled  Foot exam shows reduced sensation, check /look at your feet every day   CBC, fasting lipid, cmp and EGHFr and tSH in 4 month

## 2014-06-16 NOTE — Progress Notes (Signed)
   Subjective:    Patient ID: Ashley Armstrong, female    DOB: [birth year], 48 y.o.   MRN: [id number]  HPI Pt in with continued upper extremity weakness in grip, limitation in shoulder mobility, reports balance to be off with clumsiness in walking, poor sensation ion feet, feels limitation in ability  To climb stairs Vision is improving, but right eye still a significant problem  Depression screen despite mental health involvement is 18  Ongoing stomach pain, has been following with GI, , chronic nausea Still appealing for permanent disability, which , based on her health is appropriate. Reports chronic fatigue and a feeling of hopelessness often, denies suicidal or homicidal ideation. Blood sugars remain uncontrolled and higher than goal, however she is also trying to make this better    Review of Systems See HPI Denies recent fever or chills. Denies sinus pressure, nasal congestion, ear pain or sore throat. Denies chest congestion, productive cough or wheezing. Denies chest pains, palpitations and leg swelling Chronic abdominal pain and nausea with gastroparesis, followed by GI Denies dysuria, frequency, hesitancy or incontinence. Denies headaches, or  seizures,  Fungal great toe nail remains, podiatry recommends against removal, poor wound healing anticipated, and she states the lamisil tabs did nothing for her      Objective:   Physical Exam BP 130/80  Pulse 87  Resp 16  Ht 5\' 7"  (1.702 m)  Wt 115 lb (52.164 kg)  BMI 18.01 kg/m2  SpO2 97%  LMP 07/19/2013 Patient alert and oriented and in no cardiopulmonary distress.  HEENT: No facial asymmetry, EOMI,   oropharynx pink and moist.  Neck supple no JVD, no mass.  Chest: Clear to auscultation bilaterally.  CVS: S1, S2 no murmurs, no S3.Regular rate.  ABD: Soft non tender.   Ext: No edema  MS: Adequate ROM spine,decreased ROM  Shoulders, adequate in  hips and knees.  Skin: Intact, no ulcerations or rash  noted.  Psych: Good eye contact, normal affect. Memory intact not anxious or depressed appearing.  CNS: CN 3-12 intact, power,  normal throughout.no focal deficits noted.        Assessment & Plan:  Diabetes mellitus, insulin dependent (IDDM), uncontrolled Uncontrolled, but improved. Treated by endo, will send updated lab to endo for med adjustment  Patient advised to reduce carb and sweets, commit to regular physical activity, take meds as prescribed, test blood as directed, and attempt to lose weight, to improve blood sugar control.   Decreased range of motion of right shoulder Ongoing debilitating complaint , prohibiting gainful employment , has ahd trhe expertise of 2 orthopedic surgeons, neither of which recommend surgery  HTN (hypertension), benign Controlled, no change in medication   Other and unspecified hyperlipidemia Controlled, no change in medication Hyperlipidemia:Low fat diet discussed and encouraged.    Generalized anxiety disorder improved on current meds which are prescribed by psychiatry  Depression with anxiety Chronic illness which is debilitating are major contributors to her depression, also inability to get disability , for which she absolutely qualifies , is an additional challenge. Followed by psychiatry, not suicidal or homicidal  Need for diphtheria-tetanus-pertussis (Tdap) vaccine Vaccine administered

## 2014-06-17 LAB — MICROALBUMIN / CREATININE URINE RATIO
Creatinine, Urine: 192.7 mg/dL
MICROALB UR: 70.84 mg/dL — AB (ref 0.00–1.89)
Microalb Creat Ratio: 367.6 mg/g — ABNORMAL HIGH (ref 0.0–30.0)

## 2014-06-18 DIAGNOSIS — F418 Other specified anxiety disorders: Secondary | ICD-10-CM | POA: Insufficient documentation

## 2014-06-18 DIAGNOSIS — Z23 Encounter for immunization: Secondary | ICD-10-CM | POA: Insufficient documentation

## 2014-06-18 NOTE — Assessment & Plan Note (Signed)
Controlled, no change in medication Hyperlipidemia:Low fat diet discussed and encouraged.  \ 

## 2014-06-18 NOTE — Assessment & Plan Note (Signed)
Vaccine administered.

## 2014-06-18 NOTE — Assessment & Plan Note (Signed)
Uncontrolled, but improved. Treated by endo, will send updated lab to endo for med adjustment  Patient advised to reduce carb and sweets, commit to regular physical activity, take meds as prescribed, test blood as directed, and attempt to lose weight, to improve blood sugar control.

## 2014-06-18 NOTE — Assessment & Plan Note (Signed)
improved on current meds which are prescribed by psychiatry

## 2014-06-18 NOTE — Assessment & Plan Note (Signed)
Ongoing debilitating complaint , prohibiting gainful employment , has ahd trhe expertise of 2 orthopedic surgeons, neither of which recommend surgery

## 2014-06-18 NOTE — Assessment & Plan Note (Signed)
Controlled, no change in medication  

## 2014-06-18 NOTE — Assessment & Plan Note (Signed)
Chronic illness which is debilitating are major contributors to her depression, also inability to get disability , for which she absolutely qualifies , is an additional challenge. Followed by psychiatry, not suicidal or homicidal

## 2014-06-22 ENCOUNTER — Other Ambulatory Visit: Payer: Self-pay | Admitting: Family Medicine

## 2014-06-22 DIAGNOSIS — Z1231 Encounter for screening mammogram for malignant neoplasm of breast: Secondary | ICD-10-CM

## 2014-06-30 ENCOUNTER — Ambulatory Visit (INDEPENDENT_AMBULATORY_CARE_PROVIDER_SITE_OTHER): Payer: BC Managed Care – PPO | Admitting: Gastroenterology

## 2014-06-30 ENCOUNTER — Encounter: Payer: Self-pay | Admitting: Gastroenterology

## 2014-06-30 VITALS — BP 120/77 | HR 85 | Temp 97.6°F | Ht 67.0 in | Wt 116.6 lb

## 2014-06-30 DIAGNOSIS — K3184 Gastroparesis: Secondary | ICD-10-CM

## 2014-06-30 DIAGNOSIS — R197 Diarrhea, unspecified: Secondary | ICD-10-CM | POA: Insufficient documentation

## 2014-06-30 DIAGNOSIS — R1013 Epigastric pain: Secondary | ICD-10-CM | POA: Insufficient documentation

## 2014-06-30 NOTE — Patient Instructions (Signed)
START CREON TODAY.  GET LABS AND SUBMIT STOLL SAMPLE TODAY.  GET CT SCAN NEXT WEEK.  FOLLOW A DIABETIC DIET/LOW FAT DIET.   USE KAOPECTATE AS NEEDED FOR DIARRHEA.  FOLLOW UP IN 3 MOS.

## 2014-06-30 NOTE — Assessment & Plan Note (Signed)
MOST LIKELY DUE TO DIABETIC ENTEROPATHY OR PANCREATIC INSUFFICIENCY, LESS LIKELYUTI, COLITIS, ENTERITIS,  OR GASTRINOMA.  LABS URINE SAMPLE STOOL SAMPLE ADD CREON OPV IN 3 MOS

## 2014-06-30 NOTE — Assessment & Plan Note (Signed)
ASSOCIATED WITH NAUSEA AND WEIGHT LOSS.  LABS STOOL SAMPLE CT ABD W/ IVC NEXT WEEK ADD CREON 1 WITH MEALS AND SNACKS. SAMPLES GIVEN. FOLLOW UP IN 3 MOS.

## 2014-06-30 NOTE — Progress Notes (Signed)
Reminder in epic °

## 2014-06-30 NOTE — Progress Notes (Signed)
Subjective:    Patient ID: Ashley Armstrong, female    DOB: [birth year], 48 y.o.   MRN: [id number]  [name], MD  HPI BURNING IN STOMACH AT TOP LIKE SHE'S ON FIRE. NO ETOH ASPIRIN, BC/GOODY POWDERS, IBUPROFEN/MOTRIN, OR NAPROXEN/ALEVE.FROM FRONT TO BACK ALL THE WAY AROUND. CAN SWEAT WITH PAIN. OFF AND ON. LASTS ALL NIGHT. BETTER: NONE. WORSE: FOOD. WHEN SHE EATS FEELS LIKE SHE'S GOING TO THROW UP. SX FOR 3 MOS-SAME. BMs: LOTS OF DIARRHEA LAST 3 MOS. ONCE Q2 DAYS(SQUIRTS/NOT WATERY). NO VOMITING IN 3 MOS. LAST NL FORMED STOOL: CAN'T REMEMBER. HAS BEEN ON ABX. WELL WATER: YES. THROAT STAYS SWOLLEN(?ALLERGIES). DOESN'T FEEL LIKE HEARTBURN.  PT DENIES FEVER, CHILLS, HEMATOCHEZIA, melena, diarrhea, CHEST PAIN, SHORTNESS OF BREATH,  constipation, problems swallowing, problems with sedation, heartburn or indigestion.   Past Medical History  Diagnosis Date  . Nicotine addiction   . Anxiety   . IDDM (insulin dependent diabetes mellitus)   . Dyslipidemia   . Headache(784.0)   . TIA (transient ischemic attack)     Dr. Merlene Laughter  . Gastroparesis due to DM OCT 2014    75% AT 2 HRS, GLU >    Past Surgical History  Procedure Laterality Date  . Laser surgery bilateral eye    . Esophagogastroduodenoscopy N/A 08/31/2013    Procedure: ESOPHAGOGASTRODUODENOSCOPY (EGD);  Surgeon: Danie Binder, MD;  Location: [facility];  Service: Endoscopy;  Laterality: N/A;  11:15   No Known Allergies  Current Outpatient Prescriptions  Medication Sig Dispense Refill  . clotrimazole-betamethasone (LOTRISONE) cream Apply 1 application topically 2 (two) times daily.    . diazepam (VALIUM) 10 MG tablet Take 10 mg by mouth 2 (two) times daily as needed for anxiety.    . Diphenhyd-Hydrocort-Nystatin (FIRST-DUKES MOUTHWASH) SUSP 10 cc 3 times daily ,gargle , swish and swallow    . DULoxetine (CYMBALTA) 60 MG capsule Take 1 capsule (60 mg total) by mouth daily.    Marland Kitchen gabapentin (NEURONTIN) 300 MG capsule Take 300  mg by mouth daily.     . insulin aspart (NOVOLOG) 100 UNIT/ML SOPN FlexPen Inject 7 Units into the skin 3 (three) times daily.    . insulin glargine (LANTUS) 100 UNIT/ML injection Inject 25 Units into the skin daily.    Marland Kitchen lisinopril (PRINIVIL,ZESTRIL) 2.5 MG tablet TAKE ONE (1) TABLET BY MOUTH EVERY DAY    . omega-3 acid ethyl esters (LOVAZA) 1 G capsule Take 1 g by mouth daily.    Marland Kitchen omeprazole (PRILOSEC) 40 MG capsule 1 po 30 mins prior to breakfast    . ondansetron (ZOFRAN) 4 MG tablet TAKE ONE TABLET BY MOUTH EVERY EIGHT HOURS AS NEEDED FOR NAUSEA 1X/DAY   . pravastatin (PRAVACHOL) 80 MG tablet Take 1 tablet (80 mg total) by mouth every evening.    . Ranibizumab (LUCENTIS) 0.3 MG/0.05ML SOLN Inject 0.3 mg into the eye every 6 (six) weeks.    . terbinafine (LAMISIL) 250 MG tablet TAKE ONE (1) TABLET BY MOUTH EVERY DAY    . traMADol-acetaminophen (ULTRACET) 37.5-325 MG per tablet Take 1 tablet by mouth every 4 (four) hours as needed.         Review of Systems     Objective:   Physical Exam  Vitals reviewed. Constitutional: She is oriented to person, place, and time. She appears well-developed and well-nourished. No distress.  HENT:  Head: Normocephalic and atraumatic.  Mouth/Throat: Oropharynx is clear and moist. No oropharyngeal exudate.  Eyes: Pupils are equal, round, and  reactive to light. No scleral icterus.  Neck: Normal range of motion. Neck supple.  Cardiovascular: Normal rate, regular rhythm and normal heart sounds.   Pulmonary/Chest: Effort normal and breath sounds normal. No respiratory distress.  Abdominal: Soft. Bowel sounds are normal. She exhibits no distension. There is tenderness. There is no rebound and no guarding.  MILD TTP IN THE EPIGASTRIUM    Musculoskeletal: She exhibits no edema.  Lymphadenopathy:    She has no cervical adenopathy.  Neurological: She is alert and oriented to person, place, and time.  NO FOCAL DEFICITS   Psychiatric: She has a normal  mood and affect.          Assessment & Plan:   Past Medical History  Diagnosis Date  . Nicotine addiction   . Anxiety   . IDDM (insulin dependent diabetes mellitus)   . Dyslipidemia   . Headache(784.0)   . TIA (transient ischemic attack)     Dr. Merlene Laughter  . Gastroparesis due to DM OCT 2014    75% AT 2 HRS, GLU >

## 2014-06-30 NOTE — Assessment & Plan Note (Signed)
SX FAIRLY WELL CONTROLLED. WEIGHT STABLE.  ZOFRAN PRN GASTROPARESIS DIET OPV IN 3 MOS

## 2014-07-04 NOTE — Progress Notes (Signed)
cc'd to pcp 

## 2014-07-05 ENCOUNTER — Ambulatory Visit (HOSPITAL_COMMUNITY)
Admission: RE | Admit: 2014-07-05 | Discharge: 2014-07-05 | Disposition: A | Discharge status: Home / Self Care | Payer: BC Managed Care – PPO | Source: Ambulatory Visit | Attending: Gastroenterology | Admitting: Gastroenterology

## 2014-07-05 ENCOUNTER — Encounter (HOSPITAL_COMMUNITY): Payer: Self-pay

## 2014-07-05 DIAGNOSIS — E119 Type 2 diabetes mellitus without complications: Secondary | ICD-10-CM | POA: Insufficient documentation

## 2014-07-05 DIAGNOSIS — I7090 Unspecified atherosclerosis: Secondary | ICD-10-CM | POA: Insufficient documentation

## 2014-07-05 DIAGNOSIS — R1013 Epigastric pain: Secondary | ICD-10-CM | POA: Diagnosis not present

## 2014-07-05 DIAGNOSIS — D1803 Hemangioma of intra-abdominal structures: Secondary | ICD-10-CM | POA: Insufficient documentation

## 2014-07-05 DIAGNOSIS — R110 Nausea: Secondary | ICD-10-CM | POA: Insufficient documentation

## 2014-07-05 DIAGNOSIS — R197 Diarrhea, unspecified: Secondary | ICD-10-CM

## 2014-07-05 DIAGNOSIS — K3184 Gastroparesis: Secondary | ICD-10-CM | POA: Insufficient documentation

## 2014-07-05 DIAGNOSIS — R634 Abnormal weight loss: Secondary | ICD-10-CM | POA: Insufficient documentation

## 2014-07-05 LAB — URINALYSIS, ROUTINE W REFLEX MICROSCOPIC
BILIRUBIN URINE: NEGATIVE
Hgb urine dipstick: NEGATIVE
Ketones, ur: NEGATIVE mg/dL
LEUKOCYTES UA: NEGATIVE
Nitrite: NEGATIVE
PROTEIN: NEGATIVE mg/dL
SPECIFIC GRAVITY, URINE: 1.019 (ref 1.005–1.030)
Urobilinogen, UA: 0.2 mg/dL (ref 0.0–1.0)
pH: 5.5 (ref 5.0–8.0)

## 2014-07-05 LAB — CBC WITH DIFFERENTIAL/PLATELET
Basophils Absolute: 0.1 10*3/uL (ref 0.0–0.1)
Basophils Relative: 1 % (ref 0–1)
Eosinophils Absolute: 0.1 10*3/uL (ref 0.0–0.7)
Eosinophils Relative: 2 % (ref 0–5)
HEMATOCRIT: 37.1 % (ref 36.0–46.0)
HEMOGLOBIN: 12.7 g/dL (ref 12.0–15.0)
LYMPHS ABS: 1.7 10*3/uL (ref 0.7–4.0)
LYMPHS PCT: 25 % (ref 12–46)
MCH: 29 pg (ref 26.0–34.0)
MCHC: 34.2 g/dL (ref 30.0–36.0)
MCV: 84.7 fL (ref 78.0–100.0)
MONO ABS: 0.7 10*3/uL (ref 0.1–1.0)
MONOS PCT: 10 % (ref 3–12)
NEUTROS ABS: 4.2 10*3/uL (ref 1.7–7.7)
NEUTROS PCT: 62 % (ref 43–77)
Platelets: 268 10*3/uL (ref 150–400)
RBC: 4.38 MIL/uL (ref 3.87–5.11)
RDW: 13.5 % (ref 11.5–15.5)
WBC: 6.8 10*3/uL (ref 4.0–10.5)

## 2014-07-05 LAB — COMPLETE METABOLIC PANEL WITH GFR
ALK PHOS: 73 U/L (ref 39–117)
ALT: 11 U/L (ref 0–35)
AST: 13 U/L (ref 0–37)
Albumin: 4.2 g/dL (ref 3.5–5.2)
BILIRUBIN TOTAL: 0.7 mg/dL (ref 0.2–1.2)
BUN: 24 mg/dL — AB (ref 6–23)
CO2: 26 mEq/L (ref 19–32)
CREATININE: 1.14 mg/dL — AB (ref 0.50–1.10)
Calcium: 9.5 mg/dL (ref 8.4–10.5)
Chloride: 107 mEq/L (ref 96–112)
GFR, EST NON AFRICAN AMERICAN: 58 mL/min — AB
GFR, Est African American: 67 mL/min
GLUCOSE: 174 mg/dL — AB (ref 70–99)
Potassium: 4.4 mEq/L (ref 3.5–5.3)
Sodium: 142 mEq/L (ref 135–145)
Total Protein: 6.8 g/dL (ref 6.0–8.3)

## 2014-07-05 LAB — URINALYSIS, MICROSCOPIC ONLY
Bacteria, UA: NONE SEEN
CRYSTALS: NONE SEEN
Casts: NONE SEEN

## 2014-07-05 LAB — LIPASE

## 2014-07-05 MED ORDER — IOHEXOL 300 MG/ML  SOLN
100.0000 mL | Freq: Once | INTRAMUSCULAR | Status: AC | PRN
  Administered 2014-07-05: 100 mL via INTRAVENOUS

## 2014-07-05 MED ORDER — SODIUM CHLORIDE 0.9 % IJ SOLN
INTRAMUSCULAR | Status: AC
  Filled 2014-07-05: qty 500

## 2014-07-06 ENCOUNTER — Telehealth: Payer: Self-pay | Admitting: *Deleted

## 2014-07-06 NOTE — Telephone Encounter (Signed)
Pt called wanting to get her results. Please advise

## 2014-07-06 NOTE — Telephone Encounter (Signed)
Routing to Dr. Fields.  

## 2014-07-08 LAB — HM DIABETES EYE EXAM

## 2014-07-13 ENCOUNTER — Telehealth: Payer: Self-pay | Admitting: Gastroenterology

## 2014-07-13 NOTE — Telephone Encounter (Addendum)
PLEASE CALL PT. HER CT SHOWS NO SOURCE FOR HER ABDOMINAL PAIN. HER BLOOD TESTS ARE NORMAL. SHE NEEDS TO SUBMIT THE URINE AND STOOL SAMPLES.  CONTINUE CREON.  FOLLOW A DIABETIC DIET/LOW FAT DIET.  USE KAOPECTATE AS NEEDED FOR DIARRHEA.   FOLLOW UP IN NOV 2015 E30 DIARRHEA/EPIGASTRIC PAIN.

## 2014-07-13 NOTE — Telephone Encounter (Signed)
PLEASE CALL PATIENT REGARDING CT RESULTS AND LAB RESULTS [phone]

## 2014-07-14 NOTE — Telephone Encounter (Signed)
Pt is aware of results. 

## 2014-07-14 NOTE — Telephone Encounter (Signed)
PATIENT NIC'D FOR November

## 2014-07-14 NOTE — Telephone Encounter (Signed)
Pt aware of results 

## 2014-07-14 NOTE — Telephone Encounter (Signed)
PLEASE CALL PT. Her urine test is negative.

## 2014-07-14 NOTE — Telephone Encounter (Signed)
Pt is aware of the results. She will do the stool tests. She has done the UA.

## 2014-08-02 ENCOUNTER — Encounter: Payer: Self-pay | Admitting: *Deleted

## 2014-08-02 ENCOUNTER — Other Ambulatory Visit: Payer: Self-pay | Admitting: Family Medicine

## 2014-08-02 LAB — HEMOGLOBIN A1C: A1C: 9.6

## 2014-08-08 ENCOUNTER — Ambulatory Visit (HOSPITAL_COMMUNITY)
Admission: RE | Admit: 2014-08-08 | Discharge: 2014-08-08 | Disposition: A | Discharge status: Home / Self Care | Payer: BC Managed Care – PPO | Source: Ambulatory Visit | Attending: Family Medicine | Admitting: Family Medicine

## 2014-08-08 DIAGNOSIS — Z1231 Encounter for screening mammogram for malignant neoplasm of breast: Secondary | ICD-10-CM | POA: Insufficient documentation

## 2014-08-10 ENCOUNTER — Telehealth: Payer: Self-pay

## 2014-08-10 ENCOUNTER — Other Ambulatory Visit: Payer: Self-pay

## 2014-08-10 MED ORDER — CEPHALEXIN 500 MG PO CAPS: 500.0000 mg | ORAL_CAPSULE | Freq: Two times a day (BID) | ORAL | Status: DC

## 2014-08-10 NOTE — Telephone Encounter (Signed)
pls end in keflex 500mg  one twice daily #20 only, no refill, let her know

## 2014-08-10 NOTE — Telephone Encounter (Signed)
States she has an infected tooth. Wants antibiotic called in until she can get to the dentist. Was supposed to have that tooth pulled last year but hasn't got around to it yet. Small painful knot around the gum of affected tooth. Please advise. Uses [facility]

## 2014-08-10 NOTE — Telephone Encounter (Signed)
Patient aware and med called in

## 2014-08-11 ENCOUNTER — Other Ambulatory Visit: Payer: Self-pay | Admitting: Gastroenterology

## 2014-08-25 ENCOUNTER — Encounter: Payer: Self-pay | Admitting: Gastroenterology

## 2014-10-07 ENCOUNTER — Other Ambulatory Visit: Payer: Self-pay | Admitting: Gastroenterology

## 2014-10-12 ENCOUNTER — Other Ambulatory Visit: Payer: Self-pay | Admitting: Family Medicine

## 2014-10-12 LAB — HEMOGLOBIN A1C: A1c: 10

## 2014-10-18 ENCOUNTER — Ambulatory Visit (INDEPENDENT_AMBULATORY_CARE_PROVIDER_SITE_OTHER): Payer: BC Managed Care – PPO | Admitting: Family Medicine

## 2014-10-18 ENCOUNTER — Encounter: Payer: Self-pay | Admitting: Family Medicine

## 2014-10-18 ENCOUNTER — Ambulatory Visit (INDEPENDENT_AMBULATORY_CARE_PROVIDER_SITE_OTHER): Payer: BC Managed Care – PPO

## 2014-10-18 VITALS — BP 100/68 | HR 94 | Resp 16 | Ht 67.0 in | Wt 115.0 lb

## 2014-10-18 DIAGNOSIS — M5030 Other cervical disc degeneration, unspecified cervical region: Secondary | ICD-10-CM

## 2014-10-18 DIAGNOSIS — E1065 Type 1 diabetes mellitus with hyperglycemia: Secondary | ICD-10-CM

## 2014-10-18 DIAGNOSIS — F418 Other specified anxiety disorders: Secondary | ICD-10-CM

## 2014-10-18 DIAGNOSIS — E1165 Type 2 diabetes mellitus with hyperglycemia: Secondary | ICD-10-CM

## 2014-10-18 DIAGNOSIS — R5382 Chronic fatigue, unspecified: Secondary | ICD-10-CM

## 2014-10-18 DIAGNOSIS — G5602 Carpal tunnel syndrome, left upper limb: Secondary | ICD-10-CM

## 2014-10-18 DIAGNOSIS — G5603 Carpal tunnel syndrome, bilateral upper limbs: Secondary | ICD-10-CM

## 2014-10-18 DIAGNOSIS — M75102 Unspecified rotator cuff tear or rupture of left shoulder, not specified as traumatic: Secondary | ICD-10-CM

## 2014-10-18 DIAGNOSIS — IMO0001 Reserved for inherently not codable concepts without codable children: Secondary | ICD-10-CM

## 2014-10-18 DIAGNOSIS — Z794 Long term (current) use of insulin: Secondary | ICD-10-CM

## 2014-10-18 DIAGNOSIS — Z23 Encounter for immunization: Secondary | ICD-10-CM

## 2014-10-18 DIAGNOSIS — G5601 Carpal tunnel syndrome, right upper limb: Secondary | ICD-10-CM

## 2014-10-18 NOTE — Patient Instructions (Signed)
F/u in 4 month, call if you need me before  Flu vaccine today  I will call when your current note is available and will have it faxed directly to your advocate  Do NOT give up , you absolutely deserve and need access to health care

## 2014-10-18 NOTE — Progress Notes (Signed)
Subjective:    Patient ID: Ashley Armstrong, female    DOB: 29-Jun-1966, 48 y.o.   MRN: [id number]  HPI Pt in today concerned about the fact that she will be unable to afford health insurance as of 11/10/2014, actually has additional forms to be filled out, I explain to her that I will embody most of the info in this office note. Needs flu vaccine Continues to follow closely with endo re uncontrolled IDDM, her optalmologist, orthopedics, GI and mental health specialist   Review of Systems C/o chronic uncontrolled pain from neck to fingers rated at a 9, she has seen 2 orhto Docs, neither of whom recommend surgery C/o inability to raise arms above head, states she is incapable of reaching around her back, has a very weak grip, unable to open bottles C/o burning and tingling in both feet and reports that she was told yesterday that she has no feeling in the right foot Has been seeing eye specialist for 6 years, for retinal hemorhage, needs treatments every 6 weeks and she is scared about losing vision since she will not be able to afford treatment Has significant gastroparesis from her diabetes , needs regular GI flow up and medication Feels very depressed worried about her health, constant uncontrolled pain, no sleep on avg 3 hours per day, chronic fatigue and has to push herself to do anything, goes to [place] only on a voluntary basis. C/o recurrent hypoglycemia as low as 38 after eating, GI feels may be related to gastroparesis, also c/o uncontrolled reflux, and also has had dysphagia     Objective:   Physical Exam  BP 100/68 mmHg  Pulse 94  Resp 16  Ht 5\' 7"  (1.702 m)  Wt 115 lb (52.164 kg)  BMI 18.01 kg/m2  SpO2 99%  LMP 07/19/2013 Patient alert and oriented and in no cardiopulmonary distress.Anxioius and depressed appearing  HEENT: No facial asymmetry, EOMI,   oropharynx pink and moist.  Neck decreased ROM no JVD, no mass.  Chest: Clear to auscultation bilaterally.  CVS: S1, S2  no murmurs, no S3.Regular rate.  ABD: Soft non tender.   Ext: No edema  MS: Adequate ROM lumbar spine, , hips and knees.Decreased ROM shoulders  Skin: Intact, no ulcerations or rash noted.  Psych: Good eye contact, . Memory intact   CNS: CN 2-12 intact,  Grade 3 power in hands,       Assessment & Plan:  Carpal tunnel syndrome Bilateral weakness in hands , poor grip , limiting ability to safely hold objects , and constant pain and tingling contributes to disturbed/poor sleep. Not deemed a surgical candidate due to the additional complication of uncontrolled IDDM Incapable of lifting and carrying 10 pounds in a competitive work situation Handling or fingering is limited  Degenerative disc disease, cervical Produces chronic pain and limitation in left upper extremity mobility. Surgical intervention not a viable option Pt has been through PT with no significant improvement Reaching is severely impacted, incapable of doing this in a competitive work Nutritional therapist cuff syndrome of left shoulder Limits ability to lift 10 pounds or more as well as to reach  Depression with anxiety Cared for by psychiatry, however , though not suicidal or homicidal, experiencing uncontrolled symptoms due to poor physical health and the concern about access to health care .This will cause her to be "off task" on the job and limits her ability significantly to handle any additional stress including work stress  Diabetes mellitus, insulin dependent (IDDM),  uncontrolled Remains uncontrolled, with marked fluctuations in blood sugar, sometimes hypoglycemic, putting her at risk of syncopal event. Her blood sugar has negatively affected renal function, has resulted in neuropathy, and severe visual impairment , which has been responding to aggressive treatment She is to continue to follow with endo  Chronic fatigue Multifactorial chronic fatigue due to poor sleep as well as uncontrolled blood sugar ,  would likely to be "off task" over 25% of the day due tpo chronic ill health and fatigue  Need for prophylactic vaccination and inoculation against influenza Vaccine administered at visit.   Chronic kidney disease Encouraged strongly to work on diabetic control so she can prreserve her kidneys

## 2014-10-31 ENCOUNTER — Encounter: Payer: BC Managed Care – PPO | Attending: "Endocrinology | Admitting: Nutrition

## 2014-11-08 ENCOUNTER — Telehealth: Payer: Self-pay | Admitting: Family Medicine

## 2014-11-08 DIAGNOSIS — Z23 Encounter for immunization: Secondary | ICD-10-CM | POA: Insufficient documentation

## 2014-11-08 NOTE — Assessment & Plan Note (Signed)
Cared for by psychiatry, however , though not suicidal or homicidal, experiencing uncontrolled symptoms due to poor physical health and the concern about access to health care .This will cause her to be "off task" on the job and limits her ability significantly to handle any additional stress including work stress

## 2014-11-08 NOTE — Assessment & Plan Note (Signed)
Multifactorial chronic fatigue due to poor sleep as well as uncontrolled blood sugar , would likely to be "off task" over 25% of the day due tpo chronic ill health and fatigue

## 2014-11-08 NOTE — Assessment & Plan Note (Signed)
Vaccine administered at visit.  

## 2014-11-08 NOTE — Assessment & Plan Note (Signed)
Remains uncontrolled, with marked fluctuations in blood sugar, sometimes hypoglycemic, putting her at risk of syncopal event. Her blood sugar has negatively affected renal function, has resulted in neuropathy, and severe visual impairment , which has been responding to aggressive treatment She is to continue to follow with endo

## 2014-11-08 NOTE — Assessment & Plan Note (Signed)
Encouraged strongly to work on diabetic control so she can prreserve her kidneys

## 2014-11-08 NOTE — Assessment & Plan Note (Signed)
Limits ability to lift 10 pounds or more as well as to reach

## 2014-11-08 NOTE — Assessment & Plan Note (Addendum)
Produces chronic pain and limitation in left upper extremity mobility. Surgical intervention not a viable option Pt has been through PT with no significant improvement Reaching is severely impacted, incapable of doing this in a competitive work environment

## 2014-11-08 NOTE — Assessment & Plan Note (Addendum)
Bilateral weakness in hands , poor grip , limiting ability to safely hold objects , and constant pain and tingling contributes to disturbed/poor sleep. Not deemed a surgical candidate due to the additional complication of uncontrolled IDDM Incapable of lifting and carrying 10 pounds in a competitive work situation Handling or fingering is limited

## 2014-11-09 ENCOUNTER — Ambulatory Visit (INDEPENDENT_AMBULATORY_CARE_PROVIDER_SITE_OTHER): Payer: BC Managed Care – PPO | Admitting: Family Medicine

## 2014-11-09 ENCOUNTER — Encounter: Payer: Self-pay | Admitting: Family Medicine

## 2014-11-09 VITALS — BP 114/68 | HR 84 | Temp 98.4°F | Resp 16 | Wt 114.0 lb

## 2014-11-09 DIAGNOSIS — J302 Other seasonal allergic rhinitis: Secondary | ICD-10-CM

## 2014-11-09 DIAGNOSIS — IMO0001 Reserved for inherently not codable concepts without codable children: Secondary | ICD-10-CM

## 2014-11-09 DIAGNOSIS — Z794 Long term (current) use of insulin: Secondary | ICD-10-CM

## 2014-11-09 DIAGNOSIS — E1165 Type 2 diabetes mellitus with hyperglycemia: Secondary | ICD-10-CM

## 2014-11-09 DIAGNOSIS — J029 Acute pharyngitis, unspecified: Secondary | ICD-10-CM | POA: Insufficient documentation

## 2014-11-09 DIAGNOSIS — E1065 Type 1 diabetes mellitus with hyperglycemia: Secondary | ICD-10-CM

## 2014-11-09 LAB — POCT RAPID STREP A (OFFICE): RAPID STREP A SCREEN: NEGATIVE

## 2014-11-09 MED ORDER — PROMETHAZINE-DM 6.25-15 MG/5ML PO SYRP: ORAL_SOLUTION | ORAL | Status: DC

## 2014-11-09 MED ORDER — AZITHROMYCIN 250 MG PO TABS: ORAL_TABLET | ORAL | Status: DC

## 2014-11-09 MED ORDER — MONTELUKAST SODIUM 10 MG PO TABS: 10.0000 mg | ORAL_TABLET | Freq: Every day | ORAL | Status: DC

## 2014-11-09 MED ORDER — MOMETASONE FUROATE 50 MCG/ACT NA SUSP: 2.0000 | Freq: Every day | NASAL | Status: DC

## 2014-11-09 NOTE — Telephone Encounter (Signed)
Noted. Office visit note will be closed

## 2014-11-09 NOTE — Patient Instructions (Signed)
F/u as before  You are treated for pharyngitis and left cervical adenitis, also uncontrolled allergies  Four medcations are prescribed, please use as directed

## 2014-11-10 ENCOUNTER — Ambulatory Visit: Payer: BC Managed Care – PPO | Admitting: Family Medicine

## 2014-11-12 NOTE — Assessment & Plan Note (Signed)
antibiotic course, z pack prescribed , also for left cervical adenitis

## 2014-11-12 NOTE — Assessment & Plan Note (Signed)
Uncontrolled with multiple associated co morbidities, followed by endo

## 2014-11-12 NOTE — Progress Notes (Signed)
   Subjective:    Patient ID: Ashley Armstrong, female    DOB: 1966/07/02, 49 y.o.   MRN: [id number]  HPI 1 week h/o worsening sore throat with left ear pain and cough which is worse at night. She has had intermittent chills, she is uncertain about having fever.  Review of Systems See HPI  Denies chest pains, palpitations and leg swelling Denies abdominal pain, nausea, vomiting,diarrhea or constipation.   Denies dysuria, frequency, hesitancy or incontinence. Denies joint pain, swelling and limitation in mobility. Denies headaches, seizures, numbness, or tingling. Denies depression, anxiety or insomnia. Denies skin break down or rash.        Objective:   Physical Exam BP 114/68 mmHg  Pulse 84  Temp(Src) 98.4 F (36.9 C) (Oral)  Resp 16  Wt 114 lb (51.71 kg)  SpO2 97%  LMP 07/19/2013 Patient alert and oriented and in no cardiopulmonary distress.  HEENT: No facial asymmetry, EOMI,   oropharynx pink and moist.  Neck supple no JVD, tender left anterior cervical adenopathy, also erythema and edema of nasal mucosa, TM clear bilaterally Rapid strep is negative.  Chest: Clear to auscultation bilaterally.  CVS: S1, S2 no murmurs, no S3.Regular rate.  ABD: Soft non tender.   Ext: No edema  MS: Adequate ROM spine, shoulders, hips and knees.  Skin: Intact, no ulcerations or rash noted.  Psych: Good eye contact, normal affect. Memory intact not anxious or depressed appearing.  CNS: CN 2-12 intact, power,  normal throughout.no focal deficits noted.        Assessment & Plan:  Acute pharyngitis antibiotic course, z pack prescribed , also for left cervical adenitis  Seasonal allergies Uncontrolled, pt to commit to daily allergy meds and also cough suppressant is prescribed  Diabetes mellitus, insulin dependent (IDDM), uncontrolled Uncontrolled with multiple associated co morbidities, followed by endo

## 2014-11-12 NOTE — Assessment & Plan Note (Signed)
Uncontrolled, pt to commit to daily allergy meds and also cough suppressant is prescribed

## 2014-11-23 ENCOUNTER — Ambulatory Visit: Payer: Self-pay | Admitting: Nutrition

## 2014-11-30 ENCOUNTER — Telehealth: Payer: Self-pay

## 2014-11-30 ENCOUNTER — Emergency Department (HOSPITAL_COMMUNITY): Payer: BLUE CROSS/BLUE SHIELD

## 2014-11-30 ENCOUNTER — Encounter (HOSPITAL_COMMUNITY): Payer: Self-pay | Admitting: *Deleted

## 2014-11-30 ENCOUNTER — Emergency Department (HOSPITAL_COMMUNITY)
Admission: EM | Admit: 2014-11-30 | Discharge: 2014-11-30 | Disposition: A | Discharge status: Home / Self Care | Payer: BLUE CROSS/BLUE SHIELD | Attending: Emergency Medicine | Admitting: Emergency Medicine

## 2014-11-30 DIAGNOSIS — F419 Anxiety disorder, unspecified: Secondary | ICD-10-CM | POA: Insufficient documentation

## 2014-11-30 DIAGNOSIS — G8911 Acute pain due to trauma: Secondary | ICD-10-CM | POA: Diagnosis not present

## 2014-11-30 DIAGNOSIS — Z79899 Other long term (current) drug therapy: Secondary | ICD-10-CM | POA: Diagnosis not present

## 2014-11-30 DIAGNOSIS — Z8673 Personal history of transient ischemic attack (TIA), and cerebral infarction without residual deficits: Secondary | ICD-10-CM | POA: Diagnosis not present

## 2014-11-30 DIAGNOSIS — E1140 Type 2 diabetes mellitus with diabetic neuropathy, unspecified: Secondary | ICD-10-CM | POA: Insufficient documentation

## 2014-11-30 DIAGNOSIS — E1143 Type 2 diabetes mellitus with diabetic autonomic (poly)neuropathy: Secondary | ICD-10-CM | POA: Insufficient documentation

## 2014-11-30 DIAGNOSIS — L03031 Cellulitis of right toe: Secondary | ICD-10-CM | POA: Diagnosis not present

## 2014-11-30 DIAGNOSIS — E785 Hyperlipidemia, unspecified: Secondary | ICD-10-CM | POA: Diagnosis not present

## 2014-11-30 DIAGNOSIS — Z794 Long term (current) use of insulin: Secondary | ICD-10-CM | POA: Diagnosis not present

## 2014-11-30 DIAGNOSIS — Z87891 Personal history of nicotine dependence: Secondary | ICD-10-CM | POA: Diagnosis not present

## 2014-11-30 DIAGNOSIS — Z7951 Long term (current) use of inhaled steroids: Secondary | ICD-10-CM | POA: Diagnosis not present

## 2014-11-30 DIAGNOSIS — M79674 Pain in right toe(s): Secondary | ICD-10-CM | POA: Diagnosis not present

## 2014-11-30 LAB — CBG MONITORING, ED: Glucose-Capillary: 313 mg/dL — ABNORMAL HIGH (ref 70–99)

## 2014-11-30 MED ORDER — HYDROCODONE-ACETAMINOPHEN 5-325 MG PO TABS
1.0000 | ORAL_TABLET | Freq: Once | ORAL | Status: AC
  Administered 2014-11-30: 1 via ORAL
  Filled 2014-11-30: qty 1

## 2014-11-30 MED ORDER — CLINDAMYCIN HCL 150 MG PO CAPS: 150.0000 mg | ORAL_CAPSULE | Freq: Four times a day (QID) | ORAL | Status: DC

## 2014-11-30 NOTE — ED Notes (Signed)
Pt seen and evaluated by EDNP for initial assessment. 

## 2014-11-30 NOTE — Discharge Instructions (Signed)
Be sure you are taking a probiotic while taking the antibiotic. Follow up with your doctor as planned. Return here for worsening symptoms.

## 2014-11-30 NOTE — ED Notes (Signed)
Pt with right great toe with redness noted today, pt is diabetic, states toenails recently trimmed about 3 weeks ago

## 2014-11-30 NOTE — ED Provider Notes (Signed)
CSN: [id number]     Arrival date & time 11/30/14  1617 History   First MD Initiated Contact with Patient 11/30/14 1659     Chief Complaint  Patient presents with  . Toe Injury     (Consider location/radiation/quality/duration/timing/severity/associated sxs/prior Treatment) Patient is a 49 y.o. female presenting with toe pain. The history is provided by the patient.  Toe Pain This is a new problem. The current episode started today. The problem occurs constantly. The problem has been unchanged.   Ashley Armstrong is a 49 y.o. female who presents to the ED with redness, swelling and tenderness to the right great toe. She states that she hit it on a piece of furniture last night but really didn't feel pain due to her diabetic neuropathy. Today when she got out of the shower she noted the toe around the nail was swollen and red. She did have her toe nails trimmed 3 weeks ago but did not have any problems after that.   Past Medical History  Diagnosis Date  . Nicotine addiction   . Anxiety   . IDDM (insulin dependent diabetes mellitus)   . Dyslipidemia   . Headache(784.0)   . TIA (transient ischemic attack)     Dr. Merlene Laughter  . Gastroparesis due to DM OCT 2014    75% AT 2 HRS, GLU >    Past Surgical History  Procedure Laterality Date  . Laser surgery bilateral eye    . Esophagogastroduodenoscopy N/A 08/31/2013    Procedure: ESOPHAGOGASTRODUODENOSCOPY (EGD);  Surgeon: Danie Binder, MD;  Location: [facility];  Service: Endoscopy;  Laterality: N/A;  11:15   Family History  Problem Relation Age of Onset  . Hypertension Mother   . Diabetes Mother   . Hyperlipidemia Mother   . Rosacea Mother   . Hypertension Father   . Colon cancer Neg Hx    History  Substance Use Topics  . Smoking status: Former Research scientist (life sciences)  . Smokeless tobacco: Not on file     Comment: August 25, 2011 QUIT.   Marland Kitchen Alcohol Use: No   OB History    No data available     Review of Systems Negative except as stated  in HPI   Allergies  Review of patient's allergies indicates no known allergies.  Home Medications   Prior to Admission medications   Medication Sig Start Date End Date Taking? Authorizing Provider  azithromycin (ZITHROMAX) 250 MG tablet Two tablets on day one, then one tablet once daily for an additional 4 dayus 11/09/14   Fayrene Helper, MD  clindamycin (CLEOCIN) 150 MG capsule Take 1 capsule (150 mg total) by mouth every 6 (six) hours. 11/30/14   Makenleigh Crownover Bunnie Pion, NP  diazepam (VALIUM) 10 MG tablet Take 10 mg by mouth 2 (two) times daily as needed for anxiety.    Historical Provider, MD  Diphenhyd-Hydrocort-Nystatin (FIRST-DUKES MOUTHWASH) SUSP 10 cc 3 times daily ,gargle , swish and swallow 11/15/13   Fayrene Helper, MD  DULoxetine (CYMBALTA) 60 MG capsule Take 1 capsule (60 mg total) by mouth daily. 04/07/14   Fayrene Helper, MD  gabapentin (NEURONTIN) 300 MG capsule Take 300 mg by mouth daily.     Historical Provider, MD  insulin aspart (NOVOLOG) 100 UNIT/ML SOPN FlexPen Inject 7 Units into the skin 3 (three) times daily. 06/22/13   Fayrene Helper, MD  insulin glargine (LANTUS) 100 UNIT/ML injection Inject 25 Units into the skin daily. 04/28/13   Louellen Molder, MD  lisinopril (PRINIVIL,ZESTRIL) 2.5 MG tablet TAKE ONE (1) TABLET BY MOUTH EVERY DAY 10/13/14   Fayrene Helper, MD  mometasone (NASONEX) 50 MCG/ACT nasal spray Place 2 sprays into the nose daily. 11/09/14   Fayrene Helper, MD  montelukast (SINGULAIR) 10 MG tablet Take 1 tablet (10 mg total) by mouth at bedtime. 11/09/14   Fayrene Helper, MD  omega-3 acid ethyl esters (LOVAZA) 1 G capsule Take 1 g by mouth daily.    Historical Provider, MD  omeprazole (PRILOSEC) 40 MG capsule TAKE ONE CAPSULE 30 MINUTES BEFORE BREAKFAST 10/12/14   Orvil Feil, NP  ondansetron (ZOFRAN) 4 MG tablet TAKE ONE TABLET BY MOUTH EVERY EIGHT HOURS AS NEEDED FOR NAUSEA 08/12/14   Orvil Feil, NP  pravastatin (PRAVACHOL) 80 MG tablet Take 1  tablet (80 mg total) by mouth every evening. 05/18/14   Fayrene Helper, MD  promethazine-dextromethorphan (PROMETHAZINE-DM) 6.25-15 MG/5ML syrup One teaspoon at bedtime, as needed, for cough 11/09/14   Fayrene Helper, MD  Ranibizumab (LUCENTIS) 0.3 MG/0.05ML SOLN Inject 0.3 mg into the eye every 6 (six) weeks.    Historical Provider, MD  traMADol-acetaminophen (ULTRACET) 37.5-325 MG per tablet Take 1 tablet by mouth every 4 (four) hours as needed. 06/13/14   Carole Civil, MD   BP 152/89 mmHg  Pulse 95  Temp(Src) 98 F (36.7 C) (Oral)  Resp 18  Ht 5\' 7"  (1.702 m)  Wt 114 lb (51.71 kg)  BMI 17.85 kg/m2  SpO2 100%  LMP 07/19/2013 Physical Exam  Constitutional: She is oriented to person, place, and time. She appears well-developed and well-nourished.  HENT:  Head: Normocephalic.  Eyes: Conjunctivae and EOM are normal.  Neck: Neck supple.  Cardiovascular: Normal rate.   Pulmonary/Chest: Effort normal.  Abdominal: Soft. There is no tenderness.  Musculoskeletal:       Feet:  Right great toe with erythema and swelling. Pain radiates from toe to dorsum of the foot.   Neurological: She is alert and oriented to person, place, and time. No cranial nerve deficit.  Skin: Skin is warm and dry.  Psychiatric: She has a normal mood and affect. Her behavior is normal.  Nursing note and vitals reviewed.   ED Course  Procedures (including critical care time) Labs Review Labs Reviewed  CBG MONITORING, ED - Abnormal; Notable for the following:    Glucose-Capillary 313 (*)    All other components within normal limits   Dg Toe Great Right  11/30/2014   CLINICAL DATA:  Pain.  History of neuropathy  EXAM: RIGHT FIRST TOE  COMPARISON:  None.  FINDINGS: Frontal, oblique, and lateral views were obtained. No fracture or dislocation. Joint spaces appear intact. No erosive change or bony destruction. No soft tissue lesion identified.  IMPRESSION: No fracture or dislocation.  No erosive change or  bony destruction.   Electronically Signed   By: Lowella Grip M.D.   On: 11/30/2014 17:34    Dr. Jeneen Rinks in to examine the patient. Will treat with Clindamycin and she will follow up with her doctor or return here for worsening symptoms. She will take probiotics while on the antibiotics.   MDM  49 y.o. female with redness and swelling to the right great toe. Stable for discharge without red streaking or fever or other signs of systemic infection. Discussed with the patient in detail to return for worsening symptoms since she is diabetic she will need close follow up. Patient voices understanding and agrees with plan.  Final  diagnoses:  Pain of right great toe  Infected nailbed of toe, right      Ashley Murrain, NP 11/30/14 [place], MD 12/03/14 [phone]

## 2014-12-01 ENCOUNTER — Encounter: Payer: Self-pay | Admitting: Family Medicine

## 2014-12-01 ENCOUNTER — Ambulatory Visit (INDEPENDENT_AMBULATORY_CARE_PROVIDER_SITE_OTHER): Payer: BLUE CROSS/BLUE SHIELD | Admitting: Family Medicine

## 2014-12-01 VITALS — BP 126/80 | HR 100 | Resp 18 | Wt 117.0 lb

## 2014-12-01 DIAGNOSIS — IMO0001 Reserved for inherently not codable concepts without codable children: Secondary | ICD-10-CM

## 2014-12-01 DIAGNOSIS — E1165 Type 2 diabetes mellitus with hyperglycemia: Secondary | ICD-10-CM

## 2014-12-01 DIAGNOSIS — Z794 Long term (current) use of insulin: Secondary | ICD-10-CM

## 2014-12-01 DIAGNOSIS — E1065 Type 1 diabetes mellitus with hyperglycemia: Secondary | ICD-10-CM | POA: Diagnosis not present

## 2014-12-01 DIAGNOSIS — L089 Local infection of the skin and subcutaneous tissue, unspecified: Secondary | ICD-10-CM | POA: Diagnosis not present

## 2014-12-01 NOTE — Patient Instructions (Signed)
F/u as before  We will try to make an appt a with podiatrist for you before you leave , if possible, if not, please call back next week for appt info  Take the antibiotic prescribed in the ed, cleocin, as directed.  You have been referred to Dr Dorris Fetch and you may call and schedule your appointment  All the best for 2016!

## 2014-12-01 NOTE — Telephone Encounter (Signed)
Patient seen in the ED.

## 2014-12-01 NOTE — Progress Notes (Signed)
   Subjective:    Patient ID: Ashley Armstrong, female    DOB: February 06, 1966, 49 y.o.   MRN: [id number]  HPI Pt in with acute h/o infected right great toe, red, no trauma or drainage, no fever or chills. Seen in the ED and started on cleocin, here today for follow up and to get established with podiatrist of her choice C/o pain in foot extending to her ankle  Wants referral to endocrine also as she has recently acquired medicaid, still has marked uncontrolled IDDM,  With blood sugars generally out of control, overall improving however Review of Systems See HPI Denies recent fever or chills. Denies sinus pressure, nasal congestion, ear pain or sore throat. Denies chest congestion, productive cough or wheezing. Denies chest pains, palpitations and leg swelling  C/p lower extremity  Numbness,and  tingling. Denies  Uncontrolled depression, anxiety or insomnia.       Objective:   Physical Exam BP 126/80 mmHg  Pulse 100  Resp 18  Wt 117 lb (53.071 kg)  SpO2 99%  LMP 07/19/2013 Patient alert and oriented and in no cardiopulmonary distress.  HEENT: No facial asymmetry, EOMI,   oropharynx pink and moist.  Neck supple no JVD, no mass.  Chest: Clear to auscultation bilaterally.  CVS: S1, S2 no murmurs, no S3.Regular rate.  ABD: Soft non tender.   Ext: No edema    Skin: erythema and swelling of base of right great toe, tender, no drainage visible.No visile skin breakdown  Psych: Good eye contact, normal affect. Memory intact not anxious or depressed appearing.          Assessment & Plan:  Toe infection 1 day h/o acute right great toe pain, swelling and redness, no known  Trauma , no purulent drainage. Went to the ED and has been started on cleocin States that her heel up to mid calf has increased burning also Wants to [employer] with podiatrist in [place], referral attempted however more info needed ed as far as her medicaid ID, will call back next week   Diabetes  mellitus, insulin dependent (IDDM), uncontrolled Needs follow u[p appt with Dr Dorris Fetch, referral entered Patient advised to reduce carb and sweets, commit to regular physical activity, take meds as prescribed, test blood as directed, and attempt to lose weight, to improve blood sugar control.

## 2014-12-01 NOTE — Assessment & Plan Note (Signed)
Needs follow u[p appt with Dr Dorris Fetch, referral entered Patient advised to reduce carb and sweets, commit to regular physical activity, take meds as prescribed, test blood as directed, and attempt to lose weight, to improve blood sugar control.

## 2014-12-01 NOTE — Assessment & Plan Note (Addendum)
1 day h/o acute right great toe pain, swelling and redness, no known  Trauma , no purulent drainage. Went to the ED and has been started on cleocin States that her heel up to mid calf has increased burning also Wants to [employer] with podiatrist in [place], referral attempted however more info needed ed as far as her medicaid ID, will call back next week

## 2015-01-12 ENCOUNTER — Ambulatory Visit (INDEPENDENT_AMBULATORY_CARE_PROVIDER_SITE_OTHER): Payer: Medicaid Other | Admitting: Podiatry

## 2015-01-12 ENCOUNTER — Telehealth: Payer: Self-pay | Admitting: *Deleted

## 2015-01-12 ENCOUNTER — Encounter: Payer: Self-pay | Admitting: Podiatry

## 2015-01-12 ENCOUNTER — Ambulatory Visit (INDEPENDENT_AMBULATORY_CARE_PROVIDER_SITE_OTHER): Payer: Medicaid Other

## 2015-01-12 VITALS — BP 129/81 | HR 86 | Resp 16

## 2015-01-12 DIAGNOSIS — M79673 Pain in unspecified foot: Secondary | ICD-10-CM

## 2015-01-12 DIAGNOSIS — B351 Tinea unguium: Secondary | ICD-10-CM | POA: Diagnosis not present

## 2015-01-12 DIAGNOSIS — G629 Polyneuropathy, unspecified: Secondary | ICD-10-CM

## 2015-01-12 DIAGNOSIS — E119 Type 2 diabetes mellitus without complications: Secondary | ICD-10-CM

## 2015-01-12 DIAGNOSIS — E1341 Other specified diabetes mellitus with diabetic mononeuropathy: Secondary | ICD-10-CM | POA: Diagnosis not present

## 2015-01-12 NOTE — Progress Notes (Signed)
   Subjective:    Patient ID: Ashley Armstrong, female    DOB: February 08, 1966, 49 y.o.   MRN: [id number]  HPI Comments: "I had an infected toe"  Patient c/o tender 1st toe right for few weeks. The toe was red and swollen around the toenail. She went to ER and they Rx'd antibiotic. Better today. Her last A1C was 8.9.  She would like to have feet checked and interested in diabetic shoes.   Toe Pain   Diabetes      Review of Systems  All other systems reviewed and are negative.      Objective:   Physical Exam: Pulses are palpable bilateral. Neurologic sensorium is decreased percent Swan Steen monofilament. Muscle strength is decreased bilateral. Deep tendon reflexes are not elicitable. Cutaneous evaluation and x-rays thick yellow dystrophic onychomycotic nails.        Assessment & Plan:  Assessment: Severe diabetic peripheral neuropathy with thick yellow dystrophic onychomycotic nails.  Plan: Debridement of nails 1 through 5 bilateral. Discussed the use of a vitamin therapy for her nerve condition.

## 2015-01-12 NOTE — Patient Instructions (Signed)
Diabetes and Foot Care Diabetes may cause you to have problems because of poor blood supply (circulation) to your feet and legs. This may cause the skin on your feet to become thinner, break easier, and heal more slowly. Your skin may become dry, and the skin may peel and crack. You may also have nerve damage in your legs and feet causing decreased feeling in them. You may not notice minor injuries to your feet that could lead to infections or more serious problems. Taking care of your feet is one of the most important things you can do for yourself.  HOME CARE INSTRUCTIONS  Wear shoes at all times, even in the house. Do not go barefoot. Bare feet are easily injured.  Check your feet daily for blisters, cuts, and redness. If you cannot see the bottom of your feet, use a mirror or ask someone for help.  Wash your feet with warm water (do not use hot water) and mild soap. Then pat your feet and the areas between your toes until they are completely dry. Do not soak your feet as this can dry your skin.  Apply a moisturizing lotion or petroleum jelly (that does not contain alcohol and is unscented) to the skin on your feet and to dry, brittle toenails. Do not apply lotion between your toes.  Trim your toenails straight across. Do not dig under them or around the cuticle. File the edges of your nails with an emery board or nail file.  Do not cut corns or calluses or try to remove them with medicine.  Wear clean socks or stockings every day. Make sure they are not too tight. Do not wear knee-high stockings since they may decrease blood flow to your legs.  Wear shoes that fit properly and have enough cushioning. To break in new shoes, wear them for just a few hours a day. This prevents you from injuring your feet. Always look in your shoes before you put them on to be sure there are no objects inside.  Do not Jimerson your legs. This may decrease the blood flow to your feet.  If you find a minor scrape,  cut, or break in the skin on your feet, keep it and the skin around it clean and dry. These areas may be cleansed with mild soap and water. Do not cleanse the area with peroxide, alcohol, or iodine.  When you remove an adhesive bandage, be sure not to damage the skin around it.  If you have a wound, look at it several times a day to make sure it is healing.  Do not use heating pads or hot water bottles. They may burn your skin. If you have lost feeling in your feet or legs, you may not know it is happening until it is too late.  Make sure your health care provider performs a complete foot exam at least annually or more often if you have foot problems. Report any cuts, sores, or bruises to your health care provider immediately. SEEK MEDICAL CARE IF:   You have an injury that is not healing.  You have cuts or breaks in the skin.  You have an ingrown nail.  You notice redness on your legs or feet.  You feel burning or tingling in your legs or feet.  You have pain or cramps in your legs and feet.  Your legs or feet are numb.  Your feet always feel cold. SEEK IMMEDIATE MEDICAL CARE IF:   There is increasing redness,   swelling, or pain in or around a wound.  There is a red line that goes up your leg.  Pus is coming from a wound.  You develop a fever or as directed by your health care provider.  You notice a bad smell coming from an ulcer or wound. Document Released: 10/25/2000 Document Revised: 06/30/2013 Document Reviewed: 04/06/2013 Exit[employer] Patient Information 2015 [employer]. This information is not intended to replace advice given to you by your health care provider. Make sure you discuss any questions you have with your health care provider.  

## 2015-01-12 NOTE — Telephone Encounter (Signed)
Pt called requested diabetic shoes to be called in. Please advise

## 2015-01-16 NOTE — Telephone Encounter (Signed)
Form was sent to Dr Liliane Channel office since he is the treating dm provider

## 2015-02-02 LAB — HEMOGLOBIN A1C: Hgb A1c MFr Bld: 10 % — AB (ref 4.0–6.0)

## 2015-02-06 IMAGING — MG MM DIGITAL SCREENING
5 series · 5 of 5 positions shown · non-contrast
Comparison: Previous exam(s).

CLINICAL DATA: Screening.

EXAM:
DIGITAL SCREENING BILATERAL MAMMOGRAM WITH CAD

[L CC]
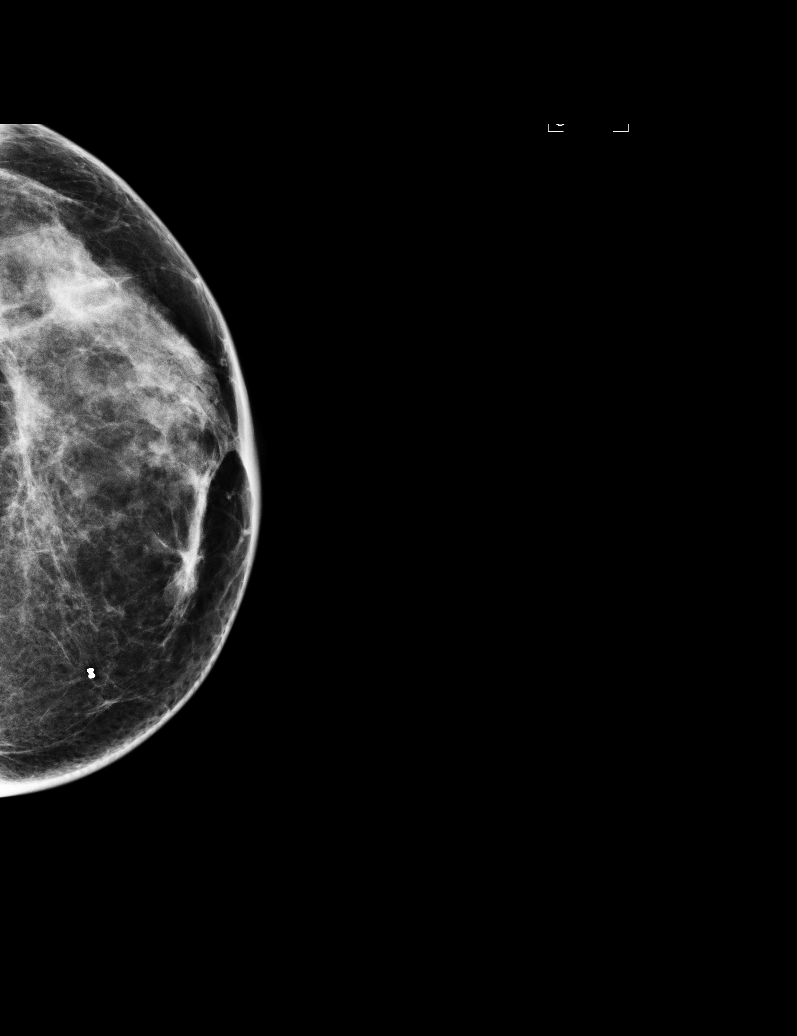

[L MLO]
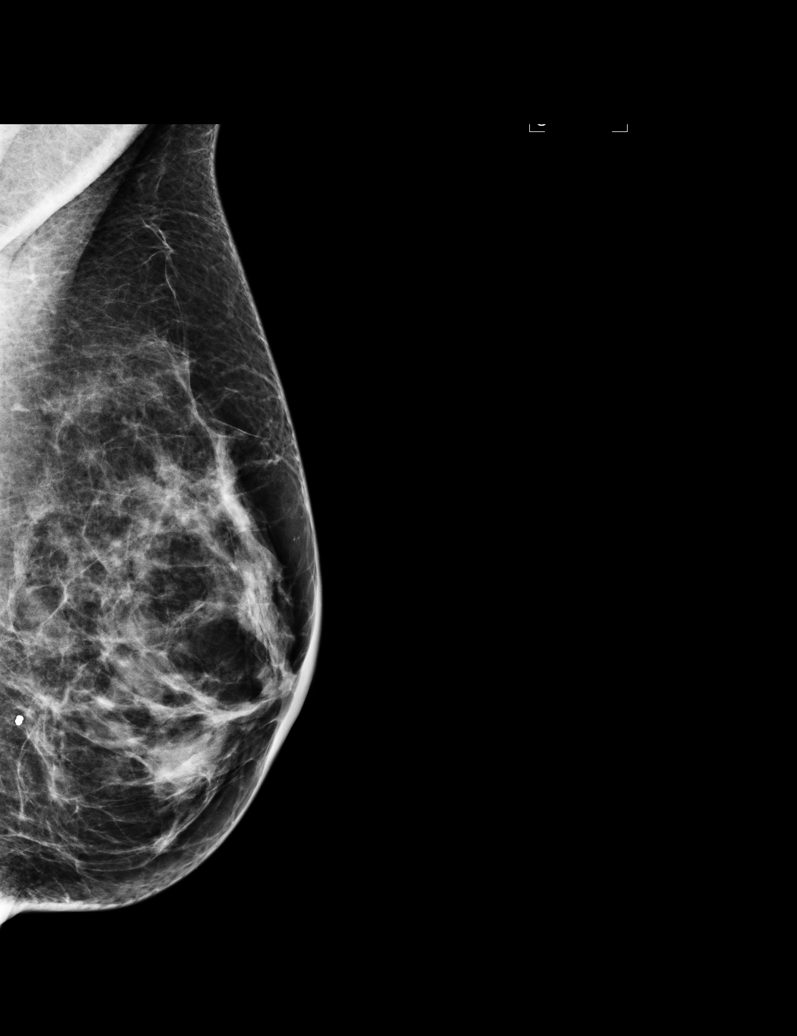

[R CC]
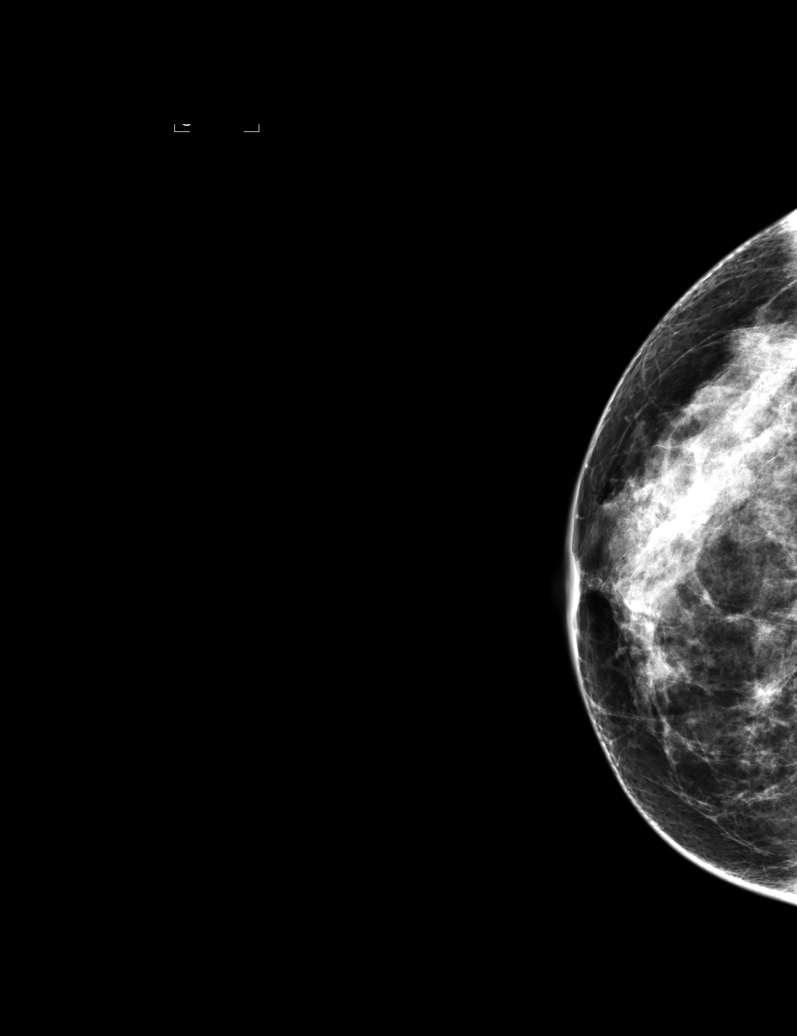

[R MLO (1 of 2)]
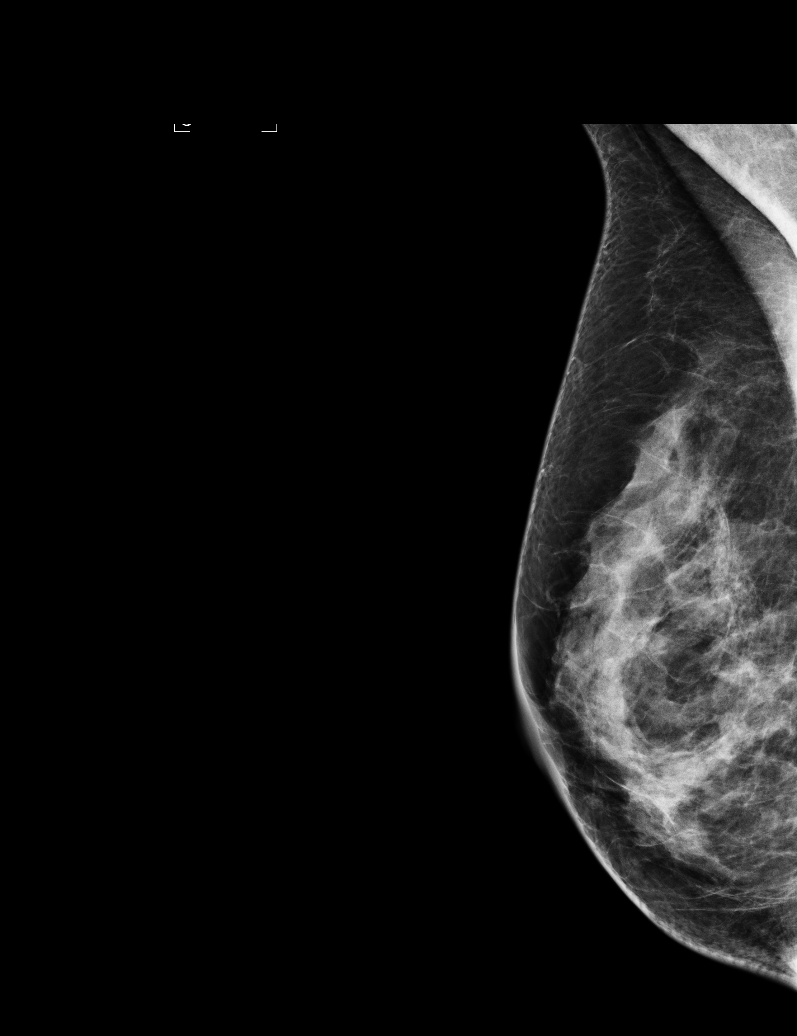

[R MLO (2 of 2)]
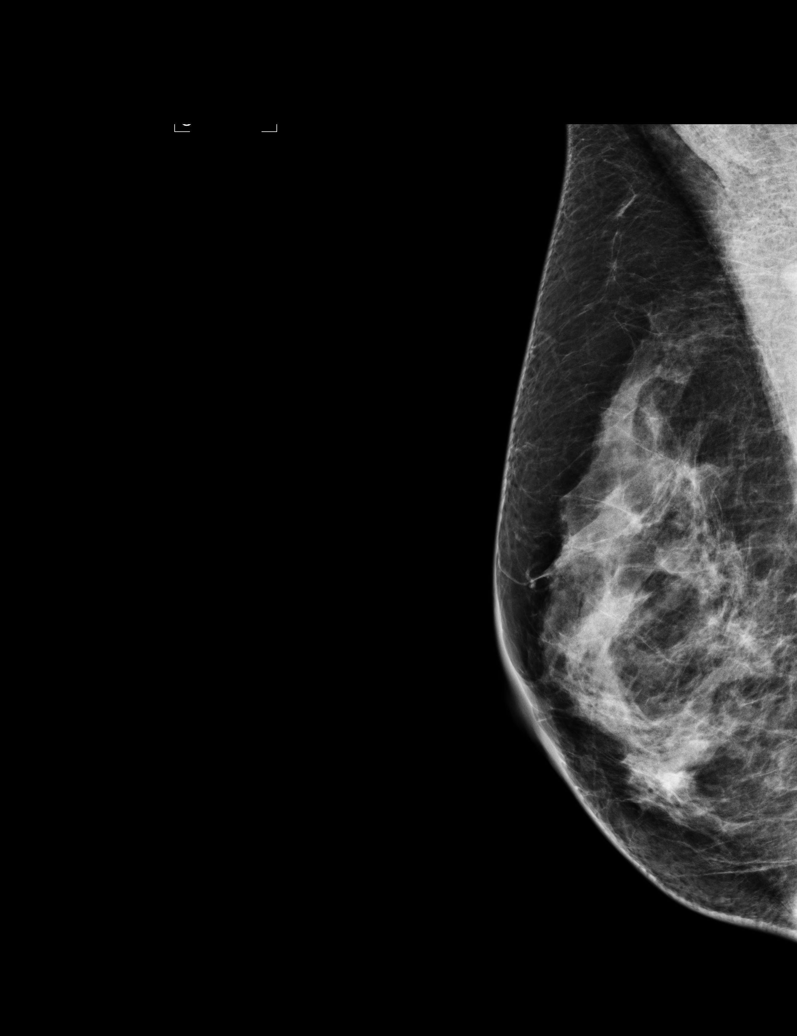

[5 of 5 positions shown; findings below may reference images not displayed]

ACR Breast Density Category c: The breast tissue is heterogeneously
dense, which may obscure small masses.
FINDINGS: There are no findings suspicious for malignancy. Images were
processed with CAD.
IMPRESSION: No mammographic evidence of malignancy. A result letter of this
screening mammogram will be mailed directly to the patient.

RECOMMENDATION:
Screening mammogram in one year. (Code:YJ-[id number])

BI-RADS CATEGORY  1: Negative.

## 2015-02-14 ENCOUNTER — Telehealth: Payer: Self-pay | Admitting: Family Medicine

## 2015-02-14 DIAGNOSIS — Z794 Long term (current) use of insulin: Principal | ICD-10-CM

## 2015-02-14 DIAGNOSIS — E1165 Type 2 diabetes mellitus with hyperglycemia: Principal | ICD-10-CM

## 2015-02-14 DIAGNOSIS — IMO0001 Reserved for inherently not codable concepts without codable children: Secondary | ICD-10-CM

## 2015-02-14 NOTE — Telephone Encounter (Signed)
Ok to refer to another endo. Pt preference?

## 2015-02-14 NOTE — Telephone Encounter (Signed)
pls contact pt, -pls ask who she wants to see, and ok to refer her i to that Doc IF they accept her ins, explain this is insrance dependent, dx uncontrolled IDDM

## 2015-02-15 ENCOUNTER — Other Ambulatory Visit: Payer: Self-pay | Admitting: *Deleted

## 2015-02-15 DIAGNOSIS — M7712 Lateral epicondylitis, left elbow: Secondary | ICD-10-CM

## 2015-02-15 DIAGNOSIS — M75102 Unspecified rotator cuff tear or rupture of left shoulder, not specified as traumatic: Secondary | ICD-10-CM

## 2015-02-15 MED ORDER — TRAMADOL-ACETAMINOPHEN 37.5-325 MG PO TABS: 1.0000 | ORAL_TABLET | ORAL | Status: DC | PRN

## 2015-02-17 ENCOUNTER — Encounter: Payer: Self-pay | Admitting: Family Medicine

## 2015-02-17 LAB — HEMOGLOBIN A1C: A1C: 10

## 2015-02-17 NOTE — Addendum Note (Signed)
Addended by: Eual Fines on: 02/17/2015 08:55 AM   Modules accepted: Orders

## 2015-02-17 NOTE — Telephone Encounter (Signed)
Referred and left pt a message to call back if she has preference to let Luann know

## 2015-02-28 ENCOUNTER — Ambulatory Visit: Payer: BC Managed Care – PPO | Admitting: Family Medicine

## 2015-02-28 ENCOUNTER — Encounter: Payer: Self-pay | Admitting: Family Medicine

## 2015-03-01 ENCOUNTER — Ambulatory Visit (INDEPENDENT_AMBULATORY_CARE_PROVIDER_SITE_OTHER): Payer: Medicaid Other | Admitting: Family Medicine

## 2015-03-01 ENCOUNTER — Encounter: Payer: Self-pay | Admitting: Family Medicine

## 2015-03-01 VITALS — BP 114/74 | HR 92 | Resp 16 | Ht 67.0 in | Wt 116.8 lb

## 2015-03-01 DIAGNOSIS — IMO0001 Reserved for inherently not codable concepts without codable children: Secondary | ICD-10-CM

## 2015-03-01 DIAGNOSIS — Z794 Long term (current) use of insulin: Secondary | ICD-10-CM

## 2015-03-01 DIAGNOSIS — E785 Hyperlipidemia, unspecified: Secondary | ICD-10-CM | POA: Diagnosis not present

## 2015-03-01 DIAGNOSIS — R1013 Epigastric pain: Secondary | ICD-10-CM

## 2015-03-01 DIAGNOSIS — E1065 Type 1 diabetes mellitus with hyperglycemia: Secondary | ICD-10-CM

## 2015-03-01 DIAGNOSIS — F418 Other specified anxiety disorders: Secondary | ICD-10-CM | POA: Diagnosis not present

## 2015-03-01 DIAGNOSIS — E1165 Type 2 diabetes mellitus with hyperglycemia: Secondary | ICD-10-CM

## 2015-03-01 DIAGNOSIS — J302 Other seasonal allergic rhinitis: Secondary | ICD-10-CM

## 2015-03-01 LAB — GLUCOSE, POCT (MANUAL RESULT ENTRY): POC Glucose: 107 mg/dl — AB (ref 70–99)

## 2015-03-01 NOTE — Assessment & Plan Note (Addendum)
Fasting sugars range from 81 to 416, she is in the process of finding a new endo , advised her of the importance of sticking with the  Very challenging discipline involved in attaining good blood sugar control which she needs for health and longevity

## 2015-03-01 NOTE — Progress Notes (Signed)
   Subjective:    Patient ID: Ashley Armstrong, female    DOB: [birth year], 49 y.o.   MRN: [id number]  HPI The PT is here for follow up and re-evaluation of chronic medical conditions, medication management and review of any available recent lab and radiology data.  Preventive health is updated, specifically  Cancer screening and Immunization.   Questions or concerns regarding consultations or procedures which the PT has had in the interim are  addressed. C/o increased abdominal pain, bloating and nausea C/o uncontrolled blood sugars , has a log showing marked fluctuation in control, states she is changing to another endo. C/o enasal drainage and congestion with sneezing        Review of Systems See HPI Denies recent fever or chills. throat. Denies chest congestion, productive cough or wheezing. Denies chest pains, palpitations and leg swelling    Denies dysuria, frequency, hesitancy or incontinence. Denies joint pain, swelling and limitation in mobility. Denies headaches, seizures, numbness, or tingling. Denies uncontrolled depression, anxiety or insomnia. Denies skin break down or rash.        Objective:   Physical Exam BP 114/74 mmHg  Pulse 92  Resp 16  Ht 5\' 7"  (1.702 m)  Wt 116 lb 12.8 oz (52.98 kg)  BMI 18.29 kg/m2  SpO2 98%  LMP 07/19/2013 Patient alert and oriented and in no cardiopulmonary distress.  HEENT: No facial asymmetry, EOMI,   oropharynx pink and moist.  Neck supple no JVD, no mass. Nasal mucosa erythematous and edematous, TM clear, no sinus tenderness Chest: Clear to auscultation bilaterally.  CVS: S1, S2 no murmur   Abdomen:superficial epigastric tenderness, no guarding or rebound, no organomegaly or mass.   Ext: No edema  MS: Adequate ROM spine, shoulders, hips and knees.  Skin: Intact, no ulcerations or rash noted.  Psych: Good eye contact, normal affect. Memory intact  anxious not  depressed appearing.  CNS: CN 2-12 intact, power,   normal throughout.no focal deficits noted.        Assessment & Plan:  Diabetes mellitus, insulin dependent (IDDM), uncontrolled Fasting sugars range from 81 to 416, she is in the process of finding a new endo , advised her of the importance of sticking with the  Very challenging discipline involved in attaining good blood sugar control which she needs for health and longevity  Abdominal pain, epigastric Chronic c/o pain , bloating and nausea, has GI appt next week, will re image RUQ to evaluate for gallstones   Depression with anxiety Needs to maintain treatment through psychiatry due to longstanding history of generalized anxiety and depression with additional psychosocial stressors which include loss of job and ability to work  Hyperlipidemia LDL goal <100 Controlled, no change in medication Hyperlipidemia:Low fat diet discussed and encouraged.   Lipid Panel  Lab Results  Component Value Date   CHOL 163 06/01/2014   HDL 62 06/01/2014   LDLCALC 80 06/01/2014   TRIG 106 06/01/2014   CHOLHDL 2.6 06/01/2014      Updated lab needed at/ before next visit.   Seasonal allergies Uncontrolled, add nasal spray to daily  Tablet and  saline flushes

## 2015-03-01 NOTE — Patient Instructions (Addendum)
F/u in 4.5 month, call if you need me before  Use allergy nasal spray along with your allergy tablet every day  Blood sugar is uncontrolled , you need to establish care with endo as soon as possible  You are referred for [place] of gall bladder to ensure no gallstones  Keep appointment with endo re stomach concerns pls  Thanks for choosing  Primary Care, we consider it a privelige to serve you.

## 2015-03-06 ENCOUNTER — Telehealth: Payer: Self-pay | Admitting: Family Medicine

## 2015-03-06 ENCOUNTER — Ambulatory Visit (HOSPITAL_COMMUNITY): Admission: RE | Admit: 2015-03-06 | Payer: Medicaid Other | Source: Ambulatory Visit

## 2015-03-07 ENCOUNTER — Other Ambulatory Visit: Payer: Self-pay

## 2015-03-07 MED ORDER — INSULIN GLARGINE 100 UNIT/ML ~~LOC~~ SOLN: 25.0000 [IU] | Freq: Every day | SUBCUTANEOUS | Status: DC

## 2015-03-07 MED ORDER — INSULIN ASPART 100 UNIT/ML FLEXPEN: 10.0000 [IU] | PEN_INJECTOR | Freq: Three times a day (TID) | SUBCUTANEOUS | Status: DC

## 2015-03-07 NOTE — Telephone Encounter (Signed)
meds refilled with no additional fills.   Referral to endo is in process.

## 2015-03-07 NOTE — Telephone Encounter (Signed)
Are you ok with refilling Novolog and Lantus

## 2015-03-07 NOTE — Telephone Encounter (Signed)
Send one refill please and ensure she has an appt to see an endo

## 2015-03-08 ENCOUNTER — Ambulatory Visit (HOSPITAL_COMMUNITY)
Admission: RE | Admit: 2015-03-08 | Discharge: 2015-03-08 | Disposition: A | Discharge status: Home / Self Care | Payer: Medicaid Other | Source: Ambulatory Visit | Attending: Family Medicine | Admitting: Family Medicine

## 2015-03-08 DIAGNOSIS — R1013 Epigastric pain: Secondary | ICD-10-CM | POA: Diagnosis not present

## 2015-03-13 ENCOUNTER — Telehealth: Payer: Self-pay | Admitting: Family Medicine

## 2015-03-14 NOTE — Telephone Encounter (Signed)
Call returned.

## 2015-03-14 NOTE — Telephone Encounter (Signed)
Message left for patient

## 2015-03-22 ENCOUNTER — Ambulatory Visit (INDEPENDENT_AMBULATORY_CARE_PROVIDER_SITE_OTHER): Payer: Medicaid Other | Admitting: Gastroenterology

## 2015-03-22 ENCOUNTER — Encounter: Payer: Self-pay | Admitting: Gastroenterology

## 2015-03-22 VITALS — BP 122/83 | HR 95 | Temp 97.4°F | Ht 67.0 in | Wt 118.4 lb

## 2015-03-22 DIAGNOSIS — K3184 Gastroparesis: Secondary | ICD-10-CM | POA: Diagnosis not present

## 2015-03-22 MED ORDER — ONDANSETRON HCL 4 MG PO TABS: 4.0000 mg | ORAL_TABLET | Freq: Three times a day (TID) | ORAL | Status: DC | PRN

## 2015-03-22 NOTE — Assessment & Plan Note (Addendum)
SYMPTOMS FAIRLY WELL CONTROLLED. BURNING SENSATION MOST LIKELY DUE TO NEUROPATHY. U/S NAIAP.  ZOFRAN PRN CONTINUE GASTROPARESIS DIET. REVIEWED ABD U/S WITH PT. FOLLOW UP IN 6 MOS.

## 2015-03-22 NOTE — Progress Notes (Signed)
cc'ed to pcp °

## 2015-03-22 NOTE — Progress Notes (Signed)
ON RECALL LIST  °

## 2015-03-22 NOTE — Progress Notes (Signed)
Subjective:    Patient ID: Ashley Armstrong, female    DOB: Jul 20, 1966, 49 y.o.   MRN: [id number]  [name], MD  HPI Having diarrhea off and on. FELT HOT AND COLD AND FREEZE TO DEATH. SWEATY AROUND WAISTLINE. NAUSEATED. FEELING FULL. BMs: 3-4(NO BLOOD)/DAY. FELT HEART POUNDING/RACING IN MID/LOWER CHEST ASSOCIATED WITH SOB. NL FORMED STOOL AFTER IMODIUM AD: 2 TABLETS YESTERDAY.  NO CHANGE IN DIET, NEW MEDS OR TRAVEL.  ALWAYS UPPER ABDOMINAL AND TENDER. THROAT PAIN & NOW ON SINGULAIR. BURNING IN FEET AND ARMS AS WELL AS ABDOMEN AND CHEST. NOW OFF NEURONTIN.  FOOD DOESN'T MAKE BURNING SENSATION BETTER OR WORSE.  PT DENIES FEVER, CHILLS, HEMATOCHEZIA, HEMATEMESIS, vomiting, melena, CHANGE IN BOWEL IN HABITS, problems swallowing, OR heartburn or indigestion.  Past Medical History  Diagnosis Date  . Nicotine addiction   . Anxiety   . IDDM (insulin dependent diabetes mellitus)   . Dyslipidemia   . Headache(784.0)   . TIA (transient ischemic attack)     Dr. Merlene Laughter  . Gastroparesis due to DM OCT 2014    75% AT 2 HRS, GLU >    Past Surgical History  Procedure Laterality Date  . Laser surgery bilateral eye    . Esophagogastroduodenoscopy N/A 08/31/2013    SLF: 1. Earky satiety nausea may be due to Gastroparesis/pyloric channel stenosis. 2. small hiatal hernia 3. Moderate erosive gastritis.    No Known Allergies Current Outpatient Prescriptions  Medication Sig Dispense Refill  . diazepam (VALIUM) 10 MG tablet Take 10 mg by mouth 2 (two) times daily as needed for anxiety.    . DULoxetine (CYMBALTA) 60 MG capsule Take 1 capsule (60 mg total) by mouth daily.    .      . insulin aspart (NOVOLOG) 100 UNIT/ML FlexPen Inject 10 Units into the skin 3 (three) times daily.    . insulin glargine (LANTUS) 100 UNIT/ML injection Inject 0.25 mLs (25 Units total) into the skin daily.    Marland Kitchen lisinopril (PRINIVIL,ZESTRIL) 2.5 MG tablet TAKE ONE (1) TABLET BY MOUTH EVERY DAY    . mometasone (NASONEX)  50 MCG/ACT nasal spray Place 2 sprays into the nose daily.    . montelukast (SINGULAIR) 10 MG tablet Take 1 tablet (10 mg total) by mouth at bedtime.    Marland Kitchen omega-3 acid ethyl esters (LOVAZA) 1 G capsule Take 1 g by mouth daily.    Marland Kitchen omeprazole (PRILOSEC) 40 MG capsule TAKE ONE CAPSULE 30 MINUTES BEFORE BREAKFAST    . ondansetron (ZOFRAN) 4 MG tablet TAKE ONE TABLET BY MOUTH EVERY EIGHT HOURS AS NEEDED FOR NAUSEA 1X/DAY   . pravastatin (PRAVACHOL) 80 MG tablet Take 1 tablet (80 mg total) by mouth every evening.    . Ranibizumab (LUCENTIS) 0.3 MG/0.05ML SOLN Inject 0.3 mg into the eye every 6 (six) weeks.    . traMADol-acetaminophen (ULTRACET) 37.5-325 MG per tablet Take 1 tablet by mouth every 4 (four) hours as needed.    . clindamycin (CLEOCIN) 150 MG capsule  2-3 MOS AGO FOR BIG TOE    Review of Systems     Objective:   Physical Exam  Constitutional: She is oriented to person, place, and time. She appears well-developed and well-nourished. No distress.  HENT:  Head: Normocephalic and atraumatic.  Mouth/Throat: Oropharynx is clear and moist. No oropharyngeal exudate.  Eyes: Pupils are equal, round, and reactive to light. No scleral icterus.  Neck: Normal range of motion. Neck supple.  Cardiovascular: Normal rate, regular rhythm and normal heart  sounds.   Pulmonary/Chest: Effort normal and breath sounds normal. No respiratory distress.  Abdominal: Soft. Bowel sounds are normal. She exhibits no distension. There is no tenderness.  Musculoskeletal: She exhibits no edema.  SPLINT IN LEFT WRIST.  Lymphadenopathy:    She has no cervical adenopathy.  Neurological: She is alert and oriented to person, place, and time.  NO NEW FOCAL DEFICITS   Psychiatric: She has a normal mood and affect.  Vitals reviewed.         Assessment & Plan:

## 2015-03-22 NOTE — Patient Instructions (Signed)
USE ZOFRAN AS NEEDED FOR NAUSEA/VOMTIING.   CONTINUE GASTROPARESIS DIET.  FOLLOW UP IN 6 MOS.

## 2015-03-23 ENCOUNTER — Ambulatory Visit (INDEPENDENT_AMBULATORY_CARE_PROVIDER_SITE_OTHER): Payer: Medicaid Other

## 2015-03-23 DIAGNOSIS — M79673 Pain in unspecified foot: Secondary | ICD-10-CM

## 2015-03-23 DIAGNOSIS — B351 Tinea unguium: Secondary | ICD-10-CM

## 2015-04-05 ENCOUNTER — Other Ambulatory Visit: Payer: Self-pay

## 2015-04-05 MED ORDER — INSULIN ASPART 100 UNIT/ML FLEXPEN: 10.0000 [IU] | PEN_INJECTOR | Freq: Three times a day (TID) | SUBCUTANEOUS | Status: DC

## 2015-04-05 MED ORDER — GLUCOSE BLOOD VI STRP: ORAL_STRIP | Status: DC

## 2015-04-05 MED ORDER — INSULIN GLARGINE 100 UNIT/ML ~~LOC~~ SOLN: 25.0000 [IU] | Freq: Every day | SUBCUTANEOUS | Status: DC

## 2015-04-06 ENCOUNTER — Other Ambulatory Visit: Payer: Self-pay

## 2015-04-06 MED ORDER — INSULIN ASPART 100 UNIT/ML FLEXPEN: PEN_INJECTOR | SUBCUTANEOUS | Status: DC

## 2015-04-06 MED ORDER — INSULIN GLARGINE 100 UNIT/ML SOLOSTAR PEN: 50.0000 [IU] | PEN_INJECTOR | Freq: Every day | SUBCUTANEOUS | Status: DC

## 2015-04-08 ENCOUNTER — Encounter (HOSPITAL_COMMUNITY): Payer: Self-pay | Admitting: Emergency Medicine

## 2015-04-08 ENCOUNTER — Emergency Department (HOSPITAL_COMMUNITY)
Admission: EM | Admit: 2015-04-08 | Discharge: 2015-04-08 | Disposition: A | Discharge status: Home / Self Care | Payer: Medicaid Other | Attending: Emergency Medicine | Admitting: Emergency Medicine

## 2015-04-08 ENCOUNTER — Emergency Department (HOSPITAL_COMMUNITY): Payer: Medicaid Other

## 2015-04-08 DIAGNOSIS — G629 Polyneuropathy, unspecified: Secondary | ICD-10-CM | POA: Diagnosis not present

## 2015-04-08 DIAGNOSIS — Y9389 Activity, other specified: Secondary | ICD-10-CM | POA: Diagnosis not present

## 2015-04-08 DIAGNOSIS — Y998 Other external cause status: Secondary | ICD-10-CM | POA: Diagnosis not present

## 2015-04-08 DIAGNOSIS — Z7951 Long term (current) use of inhaled steroids: Secondary | ICD-10-CM | POA: Insufficient documentation

## 2015-04-08 DIAGNOSIS — F419 Anxiety disorder, unspecified: Secondary | ICD-10-CM | POA: Diagnosis not present

## 2015-04-08 DIAGNOSIS — Z8673 Personal history of transient ischemic attack (TIA), and cerebral infarction without residual deficits: Secondary | ICD-10-CM | POA: Diagnosis not present

## 2015-04-08 DIAGNOSIS — S90122A Contusion of left lesser toe(s) without damage to nail, initial encounter: Secondary | ICD-10-CM | POA: Insufficient documentation

## 2015-04-08 DIAGNOSIS — S99922A Unspecified injury of left foot, initial encounter: Secondary | ICD-10-CM | POA: Diagnosis present

## 2015-04-08 DIAGNOSIS — Z79899 Other long term (current) drug therapy: Secondary | ICD-10-CM | POA: Insufficient documentation

## 2015-04-08 DIAGNOSIS — E785 Hyperlipidemia, unspecified: Secondary | ICD-10-CM | POA: Diagnosis not present

## 2015-04-08 DIAGNOSIS — Z794 Long term (current) use of insulin: Secondary | ICD-10-CM | POA: Diagnosis not present

## 2015-04-08 DIAGNOSIS — Y9289 Other specified places as the place of occurrence of the external cause: Secondary | ICD-10-CM | POA: Diagnosis not present

## 2015-04-08 DIAGNOSIS — E1143 Type 2 diabetes mellitus with diabetic autonomic (poly)neuropathy: Secondary | ICD-10-CM | POA: Diagnosis not present

## 2015-04-08 DIAGNOSIS — W208XXA Other cause of strike by thrown, projected or falling object, initial encounter: Secondary | ICD-10-CM | POA: Diagnosis not present

## 2015-04-08 HISTORY — DX: Polyneuropathy, unspecified: G62.9

## 2015-04-08 MED ORDER — OXYCODONE-ACETAMINOPHEN 5-325 MG PO TABS: 1.0000 | ORAL_TABLET | ORAL | Status: DC | PRN

## 2015-04-08 NOTE — ED Notes (Signed)
Patient reports dropped pepsi bottle on left small toe last night. Complaining of swelling, discoloration, and pain to toe.

## 2015-04-08 NOTE — Discharge Instructions (Signed)
Contusion °A contusion is a deep bruise. Contusions are the result of an injury that caused bleeding under the skin. The contusion may turn blue, purple, or yellow. Minor injuries will give you a painless contusion, but more severe contusions may stay painful and swollen for a few weeks.  °CAUSES  °A contusion is usually caused by a blow, trauma, or direct force to an area of the body. °SYMPTOMS  °· Swelling and redness of the injured area. °· Bruising of the injured area. °· Tenderness and soreness of the injured area. °· Pain. °DIAGNOSIS  °The diagnosis can be made by taking a history and physical exam. An X-ray, CT scan, or MRI may be needed to determine if there were any associated injuries, such as fractures. °TREATMENT  °Specific treatment will depend on what area of the body was injured. In general, the best treatment for a contusion is resting, icing, elevating, and applying cold compresses to the injured area. Over-the-counter medicines may also be recommended for pain control. Ask your caregiver what the best treatment is for your contusion. °HOME CARE INSTRUCTIONS  °· Put ice on the injured area. °¨ Put ice in a plastic bag. °¨ Place a towel between your skin and the bag. °¨ Leave the ice on for 15-20 minutes, 3-4 times a day, or as directed by your health care provider. °· Only take over-the-counter or prescription medicines for pain, discomfort, or fever as directed by your caregiver. Your caregiver may recommend avoiding anti-inflammatory medicines (aspirin, ibuprofen, and naproxen) for 48 hours because these medicines may increase bruising. °· Rest the injured area. °· If possible, elevate the injured area to reduce swelling. °SEEK IMMEDIATE MEDICAL CARE IF:  °· You have increased bruising or swelling. °· You have pain that is getting worse. °· Your swelling or pain is not relieved with medicines. °MAKE SURE YOU:  °· Understand these instructions. °· Will watch your condition. °· Will get help right  away if you are not doing well or get worse. °Document Released: 08/07/2005 Document Revised: 11/02/2013 Document Reviewed: 09/02/2011 °ExitCare® Patient Information ©2015 [employer]. This information is not intended to replace advice given to you by your health care provider. Make sure you discuss any questions you have with your health care provider. ° °

## 2015-04-08 NOTE — ED Provider Notes (Signed)
CSN: [id number]     Arrival date & time 04/08/15  2147 History  This chart was scribed for Virgel Manifold, MD by Stephania Fragmin, ED Scribe. This patient was seen in room APA18/[facility]18 and the patient's care was started at 10:36 PM.    Chief Complaint  Patient presents with  . Toe Pain   Patient is a 49 y.o. female presenting with toe pain. The history is provided by the patient. No language interpreter was used.  Toe Pain This is a new problem. The current episode started yesterday. The problem occurs constantly. The problem has been gradually worsening. Pertinent negatives include no chest pain, no abdominal pain, no headaches and no shortness of breath. The symptoms are aggravated by walking, standing and bending. Nothing relieves the symptoms. She has tried nothing for the symptoms.     HPI Comments: Ashley Armstrong is a 49 y.o. female with a history of IDDM who presents to the Emergency Department complaining of severe, constant, left fifth toe pain, discoloration, and swelling following an injury last night, when patient dropped a Pepsi bottle on her toe. She denies any breaking of the bottle. Bearing weight, ambulating, and trying to fit a shoe on her foot all exacerbate the pain. Patient sees a podiatrist.  Past Medical History  Diagnosis Date  . Nicotine addiction   . Anxiety   . IDDM (insulin dependent diabetes mellitus)   . Dyslipidemia   . Headache(784.0)   . TIA (transient ischemic attack)     Dr. Merlene Laughter  . Gastroparesis due to DM OCT 2014    75% AT 2 HRS, GLU >   . Neuropathy    Past Surgical History  Procedure Laterality Date  . Laser surgery bilateral eye    . Esophagogastroduodenoscopy N/A 08/31/2013    SLF: 1. Earky satiety nausea may be due to Gastroparesis/pyloric channel stenosis. 2. small hiatal hernia 3. Moderate erosive gastritis.    Family History  Problem Relation Age of Onset  . Hypertension Mother   . Diabetes Mother   . Hyperlipidemia Mother   . Rosacea  Mother   . Hypertension Father   . Colon cancer Neg Hx    History  Substance Use Topics  . Smoking status: Former Research scientist (life sciences)  . Smokeless tobacco: Not on file     Comment: August 25, 2011 QUIT.   Marland Kitchen Alcohol Use: No   OB History    No data available     Review of Systems  Respiratory: Negative for shortness of breath.   Cardiovascular: Negative for chest pain.  Gastrointestinal: Negative for abdominal pain.  Musculoskeletal: Positive for myalgias.  Skin: Positive for color change.  Neurological: Negative for headaches.  All other systems reviewed and are negative.     Allergies  Review of patient's allergies indicates no known allergies.  Home Medications   Prior to Admission medications   Medication Sig Start Date End Date Taking? Authorizing Provider  diazepam (VALIUM) 10 MG tablet Take 10 mg by mouth daily as needed for anxiety.    Yes Historical Provider, MD  DULoxetine (CYMBALTA) 60 MG capsule Take 1 capsule (60 mg total) by mouth daily. 04/07/14  Yes Fayrene Helper, MD  insulin aspart (NOVOLOG) 100 UNIT/ML FlexPen 10-20 units 3 times daily with meals Patient taking differently: Inject 10-20 Units into the skin 3 (three) times daily as needed for high blood sugar (Uses according to a sliding scale at home.). 10-20 units 3 times daily with meals 04/06/15  Yes Norwood Levo  Moshe Cipro, MD  Insulin Glargine (LANTUS) 100 UNIT/ML Solostar Pen Inject 50 Units into the skin daily at 10 pm. Patient taking differently: Inject 25 Units into the skin daily at 10 pm.  04/06/15  Yes Fayrene Helper, MD  lisinopril (PRINIVIL,ZESTRIL) 2.5 MG tablet TAKE ONE (1) TABLET BY MOUTH EVERY DAY 10/13/14  Yes Fayrene Helper, MD  mometasone (NASONEX) 50 MCG/ACT nasal spray Place 2 sprays into the nose daily. Patient taking differently: Place 2 sprays into the nose daily as needed (Allergies).  11/09/14  Yes Fayrene Helper, MD  montelukast (SINGULAIR) 10 MG tablet Take 1 tablet (10 mg total) by  mouth at bedtime. Patient taking differently: Take 10 mg by mouth daily.  11/09/14  Yes Fayrene Helper, MD  omeprazole (PRILOSEC) 40 MG capsule TAKE ONE CAPSULE 30 MINUTES BEFORE BREAKFAST 10/12/14  Yes Orvil Feil, NP  ondansetron (ZOFRAN) 4 MG tablet Take 1 tablet (4 mg total) by mouth every 8 (eight) hours as needed for nausea or vomiting. 03/22/15  Yes Danie Binder, MD  pravastatin (PRAVACHOL) 80 MG tablet Take 1 tablet (80 mg total) by mouth every evening. Patient taking differently: Take 80 mg by mouth daily.  05/18/14  Yes Fayrene Helper, MD  traMADol-acetaminophen (ULTRACET) 37.5-325 MG per tablet Take 1 tablet by mouth every 4 (four) hours as needed. 02/15/15  Yes Carole Civil, MD  clindamycin (CLEOCIN) 150 MG capsule Take 1 capsule (150 mg total) by mouth every 6 (six) hours. Patient not taking: Reported on 03/22/2015 11/30/14   Ashley Murrain, NP  glucose blood (ACCU-CHEK AVIVA) test strip Use as instructed three times daily testing E11.65 04/05/15   Fayrene Helper, MD   BP 163/80 mmHg  Pulse 96  Temp(Src) 98 F (36.7 C)  Resp 20  Ht 5\' 7"  (1.702 m)  Wt 120 lb (54.432 kg)  BMI 18.79 kg/m2  SpO2 100%  LMP 07/19/2013 Physical Exam  Constitutional: She appears well-developed and well-nourished. No distress.  HENT:  Head: Normocephalic and atraumatic.  Eyes: Conjunctivae are normal. Right eye exhibits no discharge. Left eye exhibits no discharge.  Neck: Neck supple.  Cardiovascular: Normal rate, regular rhythm and normal heart sounds.  Exam reveals no gallop and no friction rub.   No murmur heard. Pulmonary/Chest: Effort normal and breath sounds normal. No respiratory distress.  Abdominal: She exhibits no distension.  Musculoskeletal: She exhibits tenderness. She exhibits no edema.  Left fifth toe mildly swollen, ecchymotic, and TTP. No open areas.  Neurological: She is alert.  Skin: Skin is warm and dry.  Psychiatric: She has a normal mood and affect. Her behavior  is normal. Thought content normal.  Nursing note and vitals reviewed.   ED Course  Procedures (including critical care time)  DIAGNOSTIC STUDIES: Oxygen Saturation is 100% on RA, normal by my interpretation.    COORDINATION OF CARE: 10:37 PM - Discussed negative XR results and treatment plan with pt at bedside which includes post-op shoe and pain medication, and pt agreed to plan. RICE protocol discussed. Patient has no other questions or concerns at this time.    Imaging Review Dg Toe 5th Left  04/08/2015   CLINICAL DATA:  Left Fifth toe pain, swelling, and discoloration after injury last night. Initial encounter.  EXAM: DG TOE 5TH LEFT  COMPARISON:  None.  FINDINGS: There is no evidence of fracture or dislocation. No acute soft tissue findings.  IMPRESSION: Negative.   Electronically Signed   By: Monte Fantasia  M.D.   On: 04/08/2015 22:14    MDM   Final diagnoses:  Toe contusion, left, initial encounter    PRN pain meds. Post-op shoe for comfort or buddy tape. NVI.     Virgel Manifold, MD 04/13/15 [phone]

## 2015-04-08 NOTE — ED Notes (Signed)
Discharge instructions given, pt demonstrated teach back and verbal understanding. No concerns voiced.  

## 2015-04-12 ENCOUNTER — Telehealth: Payer: Self-pay | Admitting: Family Medicine

## 2015-04-12 NOTE — Telephone Encounter (Signed)
Has been to see Dr Ronnald Collum and he is wanting her to do some test that Medicaid does not pay for and she will need about 500.00 that she does not not have and he is not going to see her until she can have these test done she will need Dr Moshe Cipro to take care of her medicines please call patient back

## 2015-04-14 LAB — HEMOGLOBIN A1C
Glycohemoglobin (GHb),Total: 9.4
LDL: 81

## 2015-04-23 NOTE — Assessment & Plan Note (Signed)
Chronic c/o pain , bloating and nausea, has GI appt next week, will re image RUQ to evaluate for gallstones

## 2015-04-23 NOTE — Assessment & Plan Note (Signed)
Needs to maintain treatment through psychiatry due to longstanding history of generalized anxiety and depression with additional psychosocial stressors which include loss of job and ability to work

## 2015-04-23 NOTE — Assessment & Plan Note (Signed)
Uncontrolled, add nasal spray to daily  Tablet and  saline flushes

## 2015-04-23 NOTE — Assessment & Plan Note (Signed)
Controlled, no change in medication Hyperlipidemia:Low fat diet discussed and encouraged.   Lipid Panel  Lab Results  Component Value Date   CHOL 163 06/01/2014   HDL 62 06/01/2014   LDLCALC 80 06/01/2014   TRIG 106 06/01/2014   CHOLHDL 2.6 06/01/2014      Updated lab needed at/ before next visit.

## 2015-04-24 ENCOUNTER — Other Ambulatory Visit: Payer: Self-pay | Admitting: Family Medicine

## 2015-04-26 MED FILL — Oxycodone w/ Acetaminophen Tab 5-325 MG: ORAL | Qty: 6 | Status: AC

## 2015-05-03 NOTE — Telephone Encounter (Signed)
Dr Moshe Cipro states she is to be followed by him now for her diabetes

## 2015-05-04 ENCOUNTER — Ambulatory Visit (INDEPENDENT_AMBULATORY_CARE_PROVIDER_SITE_OTHER): Payer: Medicaid Other | Admitting: Family Medicine

## 2015-05-04 ENCOUNTER — Encounter: Payer: Self-pay | Admitting: Family Medicine

## 2015-05-04 VITALS — BP 118/70 | HR 84 | Resp 16 | Ht 67.0 in | Wt 120.0 lb

## 2015-05-04 DIAGNOSIS — E785 Hyperlipidemia, unspecified: Secondary | ICD-10-CM | POA: Diagnosis not present

## 2015-05-04 DIAGNOSIS — F418 Other specified anxiety disorders: Secondary | ICD-10-CM

## 2015-05-04 DIAGNOSIS — E1065 Type 1 diabetes mellitus with hyperglycemia: Secondary | ICD-10-CM

## 2015-05-04 DIAGNOSIS — IMO0001 Reserved for inherently not codable concepts without codable children: Secondary | ICD-10-CM

## 2015-05-04 DIAGNOSIS — E1165 Type 2 diabetes mellitus with hyperglycemia: Principal | ICD-10-CM

## 2015-05-04 DIAGNOSIS — Z794 Long term (current) use of insulin: Principal | ICD-10-CM

## 2015-05-04 LAB — LDL CHOLESTEROL, DIRECT: LDL: 81

## 2015-05-04 LAB — GLUCOSE, POCT (MANUAL RESULT ENTRY): POC GLUCOSE: 189 mg/dL — AB (ref 70–99)

## 2015-05-04 NOTE — Patient Instructions (Signed)
F/u in 5 weeks, call if you need me before  HBA1C today  Test 4 times daily and record, bring to next visit  You are referred to diabetic educator   Diabetic shoes to be addresed at next visit  Please do not scratch left  arm where recently bitten so that you do not get a skin infection

## 2015-05-04 NOTE — Telephone Encounter (Signed)
Pt has appt today

## 2015-05-05 ENCOUNTER — Other Ambulatory Visit: Payer: Self-pay | Admitting: Family Medicine

## 2015-05-05 LAB — HEMOGLOBIN A1C
Hgb A1c MFr Bld: 10 % — ABNORMAL HIGH (ref <5.7)
Mean Plasma Glucose: 240 mg/dL — ABNORMAL HIGH (ref <117)

## 2015-05-07 NOTE — Assessment & Plan Note (Signed)
Controlled and managed by psychiatry 

## 2015-05-07 NOTE — Assessment & Plan Note (Signed)
Hyperlipidemia:Low fat diet discussed and encouraged.   Lipid Panel  Lab Results  Component Value Date   CHOL 163 06/01/2014   HDL 62 06/01/2014   LDLCALC 80 06/01/2014   TRIG 106 06/01/2014   CHOLHDL 2.6 06/01/2014    Controlled no med change, LDL [place] 81,

## 2015-05-07 NOTE — Assessment & Plan Note (Signed)
Uncontrolled, currently has no endocrinologist and states she will " test her sugar and take her meds as she should" has had 2 recent endo Docs and due to her non compliance from most recent , she is now needing t find another Doc.; I have explained to t that her diabetes is longstanding and uncontrolled, so she does need to be treated by endo, I will start her back on meds, she has no access to scripts and refer her to diabetic educator, see her back in 5 weeks with log and meter, 4 times daily testing is required

## 2015-05-07 NOTE — Progress Notes (Signed)
   Subjective:    Patient ID: Ashley Armstrong, female    DOB: 10/16/1966, 49 y.o.   MRN: [id number]  HPI  Pt in  primarily to get access to diabetic care, having left one Doc she has been d/c from another due to non compliance with treatment plan. She has with her a log showing markedly improved blood sugars and states that she is "gpoing to do better" follow treatment plan and get blood sugar controlled Denies polyuria, polydipsia, blurred vision , or hypoglycemic episodes. Tests and records 4 times daily for past 2 weeks, and blood sugar ranges are under 250  And above 90   Review of Systems See HPI Denies recent fever or chills. Denies sinus pressure, nasal congestion, ear pain or sore throat. Denies chest congestion, productive cough or wheezing. Denies chest pains, palpitations and leg swelling Denies abdominal pain, nausea, vomiting,diarrhea or constipation.   Denies dysuria, frequency, hesitancy or incontinence. Denies joint pain, swelling and limitation in mobility. Denies headaches, seizures, numbness, or tingling. Denies uncontrolled  depression, anxiety or insomnia. Denies skin break down or rash.        Objective:   Physical Exam BP 118/70 mmHg  Pulse 84  Resp 16  Ht 5\' 7"  (1.702 m)  Wt 120 lb (54.432 kg)  BMI 18.79 kg/m2  SpO2 98%  LMP 07/19/2013 Patient alert and oriented and in no cardiopulmonary distress.  HEENT: No facial asymmetry, EOMI,   oropharynx pink and moist.  Neck supple no JVD, no mass.  Chest: Clear to auscultation bilaterally.  CVS: S1, S2 no murmurs, no S3.Regular rate.  ABD: Soft non tender.   Ext: No edema  MS: Adequate ROM spine, shoulders, hips and knees.  Skin: Intact, no ulcerations or rash noted.  Psych: Good eye contact, normal affect. Memory intact not anxious or depressed appearing.  CNS: CN 2-12 intact, power,  normal throughout.no focal deficits noted.        Assessment & Plan:  Diabetes mellitus, insulin dependent  (IDDM), uncontrolled Uncontrolled, currently has no endocrinologist and states she will " test her sugar and take her meds as she should" has had 2 recent endo Docs and due to her non compliance from most recent , she is now needing t find another Doc.; I have explained to t that her diabetes is longstanding and uncontrolled, so she does need to be treated by endo, I will start her back on meds, she has no access to scripts and refer her to diabetic educator, see her back in 5 weeks with log and meter, 4 times daily testing is required  Hyperlipidemia LDL goal <100 Hyperlipidemia:Low fat diet discussed and encouraged.   Lipid Panel  Lab Results  Component Value Date   CHOL 163 06/01/2014   HDL 62 06/01/2014   LDLCALC 80 06/01/2014   TRIG 106 06/01/2014   CHOLHDL 2.6 06/01/2014    Controlled no med change, LDL [place] 81,    Depression with anxiety Controlled and managed by psychiatry

## 2015-05-22 ENCOUNTER — Telehealth: Payer: Self-pay | Admitting: *Deleted

## 2015-05-22 MED ORDER — GLUCOSE BLOOD VI STRP: ORAL_STRIP | Status: DC

## 2015-05-22 NOTE — Telephone Encounter (Signed)
Pt called stating her strips was called into [facility] in [place] and only 100 was called in and she needs 200 pt said she is running out Sunday. Please advise pt is requesting brandi to call her back

## 2015-05-22 NOTE — Telephone Encounter (Signed)
Changed to 4 times daily testing and resent in

## 2015-05-24 ENCOUNTER — Other Ambulatory Visit: Payer: Self-pay

## 2015-05-24 ENCOUNTER — Telehealth: Payer: Self-pay | Admitting: *Deleted

## 2015-05-24 MED ORDER — GLUCOSE BLOOD VI STRP: ORAL_STRIP | Status: DC

## 2015-05-24 NOTE — Telephone Encounter (Signed)
Ashley Armstrong spoke with patient

## 2015-05-24 NOTE — Telephone Encounter (Signed)
Pt called stating it has been 3 days and her medication still has not been called in, pt needs her 200 test strips for the acute check plus, pt said Dr. Moshe Cipro didn't give her any refills. Pt wants it to go to [facility] in [place]. Please advise

## 2015-05-24 NOTE — Telephone Encounter (Signed)
Strips refilled today and on 7/11

## 2015-05-24 NOTE — Telephone Encounter (Signed)
Pt called [facility] sounding upset, pt said she needs 200 test strips because she checks her sugar 4 or 5 times a day depending on how she feels, pt said only 100 was called in for her and pt does not understand the no refills, pt said Dr. Dorris Fetch calls her in 200 and she is not sure why we can't call in 200. Please advise [phone]

## 2015-06-08 ENCOUNTER — Ambulatory Visit (INDEPENDENT_AMBULATORY_CARE_PROVIDER_SITE_OTHER): Payer: Medicaid Other | Admitting: Family Medicine

## 2015-06-08 ENCOUNTER — Encounter: Payer: Self-pay | Admitting: Family Medicine

## 2015-06-08 VITALS — BP 108/70 | HR 87 | Resp 16 | Ht 67.0 in | Wt 122.1 lb

## 2015-06-08 DIAGNOSIS — E1165 Type 2 diabetes mellitus with hyperglycemia: Principal | ICD-10-CM

## 2015-06-08 DIAGNOSIS — K3184 Gastroparesis: Secondary | ICD-10-CM

## 2015-06-08 DIAGNOSIS — F418 Other specified anxiety disorders: Secondary | ICD-10-CM

## 2015-06-08 DIAGNOSIS — Z794 Long term (current) use of insulin: Principal | ICD-10-CM

## 2015-06-08 DIAGNOSIS — F411 Generalized anxiety disorder: Secondary | ICD-10-CM

## 2015-06-08 DIAGNOSIS — E1065 Type 1 diabetes mellitus with hyperglycemia: Secondary | ICD-10-CM

## 2015-06-08 DIAGNOSIS — IMO0001 Reserved for inherently not codable concepts without codable children: Secondary | ICD-10-CM

## 2015-06-08 DIAGNOSIS — E785 Hyperlipidemia, unspecified: Secondary | ICD-10-CM

## 2015-06-08 DIAGNOSIS — N950 Postmenopausal bleeding: Secondary | ICD-10-CM

## 2015-06-08 LAB — GLUCOSE, POCT (MANUAL RESULT ENTRY): POC Glucose: 167 mg/dl — AB (ref 70–99)

## 2015-06-08 NOTE — Progress Notes (Signed)
Complaint:  Visit Type: Patient returns to my office for continued preventative foot care services. Complaint: Patient states" my nails have grown long and thick and become painful to walk and wear shoes" Patient has been diagnosed with DM with no foot complications. The patient presents for preventative foot care services. No changes to ROS  Podiatric Exam: Vascular: dorsalis pedis and posterior tibial pulses are palpable bilateral. Capillary return is immediate. Temperature gradient is WNL. Skin turgor WNL  Sensorium: Normal Semmes Weinstein monofilament test. Normal tactile sensation bilaterally. Nail Exam: Pt has thick disfigured discolored nails with subungual debris noted bilateral entire nail hallux through fifth toenails Ulcer Exam: There is no evidence of ulcer or pre-ulcerative changes or infection. Orthopedic Exam: Muscle tone and strength are WNL. No limitations in general ROM. No crepitus or effusions noted. Foot type and digits show no abnormalities. Bony prominences are unremarkable. Skin: No Porokeratosis. No infection or ulcers  Diagnosis:  Onychomycosis, , Pain in right toe, pain in left toes  Treatment & Plan Procedures and Treatment: Consent by patient was obtained for treatment procedures. The patient understood the discussion of treatment and procedures well. All questions were answered thoroughly reviewed. Debridement of mycotic and hypertrophic toenails, 1 through 5 bilateral and clearing of subungual debris. No ulceration, no infection noted.  Return Visit-Office Procedure: Patient instructed to return to the office for a follow up visit 3 months for continued evaluation and treatment. 

## 2015-06-08 NOTE — Patient Instructions (Addendum)
F/u in early October, call if you need me before  Script prepared for diabetic shoes which you qualify for  You are referred to nutritionist  You are referred to diabetic specialist in [place], chem 7 and EGFr Sept 23 or after if you have not had your appt with diabetic Doc before that  Continue medications as you are current ly doing

## 2015-06-08 NOTE — Progress Notes (Signed)
Subjective:    Patient ID: Ashley Armstrong, female    DOB: 20-Sep-1966, 49 y.o.   MRN: [id number]  HPI Patient in for follow up of uncontrolled IDDM type 1  Complicated by retinopathy, nephropathy , gastroparesis and neuropathy, diagnosed approximately 30 years ago. She has a blood sugar log , but no meter with her, blood sugar values are varying and inconsistent., Range recorded is between 90 to over 200 fasting,  More often nearer 180 to 200 fasting Pt requesting foot exam so that she cn get diabetic shoes. Also she needs to establish with yet another endocrinologist, I have made it clear at today' visit that I am unable to care for her diabetes , and it is vital that she complies with the treatment plan outlined by her endocrinologist C/o abdominal bloating and reflux, has upcoming appt with GI Doc who sees her regularly   Review of Systems See HPI Denies recent fever or chills. Denies sinus pressure, nasal congestion, ear pain or sore throat. Denies chest congestion, productive cough or wheezing. Denies chest pains, palpitations and leg swelling Denies abdominal pain, nausea, vomiting,diarrhea or constipation.   Denies dysuria, frequency, hesitancy or incontinence. Denies joint pain, swelling and limitation in mobility. Denies headaches, seizures, . Denies uncontrolled  depression, anxiety or insomnia. Denies skin break down or rash.        Objective:   Physical Exam  BP 108/70 mmHg  Pulse 87  Resp 16  Ht 5\' 7"  (1.702 m)  Wt 122 lb 1.9 oz (55.393 kg)  BMI 19.12 kg/m2  SpO2 100%  LMP 07/19/2013 Patient alert and oriented and in no cardiopulmonary distress.  HEENT: No facial asymmetry, EOMI,   oropharynx pink and moist.  Neck supple no JVD, no mass.  Chest: Clear to auscultation bilaterally.  CVS: S1, S2 no murmurs, no S3.Regular rate.  ABD: Soft non tender.   Ext: No edema  MS: Adequate ROM spine, shoulders, hips and knees.  Skin: Intact, no ulcerations or rash  noted.  Psych: Good eye contact, normal affect. Memory intact not anxious or depressed appearing.  CNS: CN 2-12 intact, power,  normal throughout.no focal deficits noted.       Assessment & Plan:  Diabetes mellitus, insulin dependent (IDDM), uncontrolled Ashley Armstrong is reminded of the importance of commitment to daily physical activity for 30 minutes or more, as able and the need to limit carbohydrate intake to 30 to 60 grams per meal to help with blood sugar control.   The need to take medication as prescribed, test blood sugar as directed, and to call between visits if there is a concern that blood sugar is uncontrolled is also discussed.  Uncontrolled with multiple complications, needs endo management will refer  Ashley Armstrong is reminded of the importance of daily foot exam, annual eye examination, and good blood sugar, blood pressure and cholesterol control.  Diabetic Labs Latest Ref Rng 05/04/2015 02/02/2015 07/04/2014 06/16/2014 06/01/2014  HbA1c <5.7 % 10.0(H) 10.0(A) - - 9.7(H)  Microalbumin 0.00 - 1.89 mg/dL - - - 70.84(H) -  Micro/Creat Ratio 0.0 - 30.0 mg/g - - - 367.6(H) -  Chol 0 - 200 mg/dL - - - - 163  HDL >39 mg/dL - - - - 62  Calc LDL 0 - 99 mg/dL - - - - 80  Triglycerides <150 mg/dL - - - - 106  Creatinine 0.50 - 1.10 mg/dL - - 1.14(H) - -   BP/Weight 06/08/2015 05/04/2015 04/08/2015 03/22/2015 03/01/2015 01/12/2015 05/12/6377  Systolic  BP 108 118 163 122 121 975 883  Diastolic BP 70 70 80 83 74 81 80  Wt. (Lbs) 122.12 120 120 118.4 116.8 - 117  BMI 19.12 18.79 18.79 18.54 18.29 - 18.32   Foot/eye exam completion dates Latest Ref Rng 06/08/2015 07/08/2014  Eye Exam No Retinopathy - Retinopathy(A)  Foot Form Completion - Done -         Generalized anxiety disorder Managed by psych, controlled  Depression with anxiety Managed by psychiatry, controlled  Hyperlipidemia LDL goal <100 Controlled, no change in medication Hyperlipidemia:Low fat diet discussed and  encouraged.   Lipid Panel  Lab Results  Component Value Date   CHOL 163 06/01/2014   HDL 62 06/01/2014   LDLCALC 80 06/01/2014   TRIG 106 06/01/2014   CHOLHDL 2.6 06/01/2014        Gastroparesis Chronic and unchanged , follow up with GI

## 2015-06-09 NOTE — Assessment & Plan Note (Signed)
Chronic and unchanged , follow up with GI

## 2015-06-09 NOTE — Assessment & Plan Note (Signed)
Managed by psychiatry, controlled 

## 2015-06-09 NOTE — Assessment & Plan Note (Signed)
Controlled, no change in medication Hyperlipidemia:Low fat diet discussed and encouraged.   Lipid Panel  Lab Results  Component Value Date   CHOL 163 06/01/2014   HDL 62 06/01/2014   LDLCALC 80 06/01/2014   TRIG 106 06/01/2014   CHOLHDL 2.6 06/01/2014

## 2015-06-09 NOTE — Assessment & Plan Note (Signed)
Ashley Armstrong is reminded of the importance of commitment to daily physical activity for 30 minutes or more, as able and the need to limit carbohydrate intake to 30 to 60 grams per meal to help with blood sugar control.   The need to take medication as prescribed, test blood sugar as directed, and to call between visits if there is a concern that blood sugar is uncontrolled is also discussed.  Uncontrolled with multiple complications, needs endo management will refer  Ashley Armstrong is reminded of the importance of daily foot exam, annual eye examination, and good blood sugar, blood pressure and cholesterol control.  Diabetic Labs Latest Ref Rng 05/04/2015 02/02/2015 07/04/2014 06/16/2014 06/01/2014  HbA1c <5.7 % 10.0(H) 10.0(A) - - 9.7(H)  Microalbumin 0.00 - 1.89 mg/dL - - - 70.84(H) -  Micro/Creat Ratio 0.0 - 30.0 mg/g - - - 367.6(H) -  Chol 0 - 200 mg/dL - - - - 163  HDL >39 mg/dL - - - - 62  Calc LDL 0 - 99 mg/dL - - - - 80  Triglycerides <150 mg/dL - - - - 106  Creatinine 0.50 - 1.10 mg/dL - - 1.14(H) - -   BP/Weight 06/08/2015 05/04/2015 04/08/2015 03/22/2015 03/01/2015 01/12/2015 8/58/8502  Systolic BP 774 128 786 767 209 470 962  Diastolic BP 70 70 80 83 74 81 80  Wt. (Lbs) 122.12 120 120 118.4 116.8 - 117  BMI 19.12 18.79 18.79 18.54 18.29 - 18.32   Foot/eye exam completion dates Latest Ref Rng 06/08/2015 07/08/2014  Eye Exam No Retinopathy - Retinopathy(A)  Foot Form Completion - Done -

## 2015-06-09 NOTE — Assessment & Plan Note (Signed)
Managed by psych, controlled

## 2015-06-10 ENCOUNTER — Other Ambulatory Visit: Payer: Self-pay | Admitting: Family Medicine

## 2015-06-12 ENCOUNTER — Telehealth: Payer: Self-pay | Admitting: *Deleted

## 2015-06-12 NOTE — Telephone Encounter (Signed)
Try preston clarke

## 2015-06-12 NOTE — Telephone Encounter (Signed)
Pt called stating Dr. Loanne Drilling does not accept medicaid, pt is requesting to be sent to Dr. Ronnald Collum, can a new referral be put in

## 2015-06-12 NOTE — Telephone Encounter (Signed)
Pt called back stating she cannot see Dr. Ronnald Collum because she would have to pay him a 1000$ for 2 test and she cannot pay that. Who can we send this patient too?

## 2015-06-13 NOTE — Telephone Encounter (Signed)
Dr. Jeanann Lewandowsky is not accepting Medicaid Patient's.

## 2015-06-22 ENCOUNTER — Encounter: Payer: Self-pay | Admitting: Podiatry

## 2015-06-22 ENCOUNTER — Ambulatory Visit (INDEPENDENT_AMBULATORY_CARE_PROVIDER_SITE_OTHER): Payer: Medicaid Other | Admitting: Podiatry

## 2015-06-22 ENCOUNTER — Ambulatory Visit: Payer: Medicaid Other | Admitting: Podiatry

## 2015-06-22 DIAGNOSIS — M79673 Pain in unspecified foot: Secondary | ICD-10-CM

## 2015-06-22 DIAGNOSIS — B351 Tinea unguium: Secondary | ICD-10-CM | POA: Diagnosis not present

## 2015-06-22 NOTE — Progress Notes (Signed)
Patient presents to the office today with a chief complaint of painful elongated toenails.  Objective: Pulses are palpable bilateral. Nails are thick yellow dystrophic clinically mycotic and painful palpation.  Assessment: Pain in limb secondary to onychomycosis 1 through 5 bilateral.  Plan: Debridement of nails 1 through 5 bilateral covered service secondary to pain.  

## 2015-07-11 ENCOUNTER — Encounter: Payer: Self-pay | Admitting: Family Medicine

## 2015-07-11 ENCOUNTER — Ambulatory Visit (INDEPENDENT_AMBULATORY_CARE_PROVIDER_SITE_OTHER): Payer: Medicaid Other | Admitting: Family Medicine

## 2015-07-11 VITALS — BP 122/80 | HR 94 | Resp 16 | Ht 67.0 in | Wt 119.0 lb

## 2015-07-11 DIAGNOSIS — E10311 Type 1 diabetes mellitus with unspecified diabetic retinopathy with macular edema: Secondary | ICD-10-CM

## 2015-07-11 DIAGNOSIS — E1065 Type 1 diabetes mellitus with hyperglycemia: Secondary | ICD-10-CM

## 2015-07-19 ENCOUNTER — Encounter: Payer: Self-pay | Admitting: Gastroenterology

## 2015-07-19 ENCOUNTER — Ambulatory Visit (INDEPENDENT_AMBULATORY_CARE_PROVIDER_SITE_OTHER): Payer: Medicaid Other | Admitting: Gastroenterology

## 2015-07-19 VITALS — BP 129/85 | HR 94 | Temp 97.6°F | Ht 68.0 in | Wt 117.8 lb

## 2015-07-19 DIAGNOSIS — R1013 Epigastric pain: Secondary | ICD-10-CM

## 2015-07-19 DIAGNOSIS — K3184 Gastroparesis: Secondary | ICD-10-CM

## 2015-07-19 MED ORDER — OMEPRAZOLE 40 MG PO CPDR: DELAYED_RELEASE_CAPSULE | ORAL | Status: DC

## 2015-07-19 NOTE — Assessment & Plan Note (Addendum)
OCCURRED ACUTELY AFTER DIARRHEA/ABDOMINAL PAIN AND MOST LIKELY RELATED TO GASTRIC RETENTION OF FOOD/GASTRITIS. SYMPTOMS NOT CONTROLLED.  INCREASE OMEPRAZOLE TO BID FOR 3 MOS THEN ONCE DAILY. GASTROPARESIS II DIET FOLLOW UP IN 4 MOS.

## 2015-07-19 NOTE — Assessment & Plan Note (Addendum)
WEIGHT STABLE BUT BMI < 18. SYMPTOMS NOT IDEALLY CONTROLLED.  DISCUSSED BENEFITS OF SUPPLEMENTS AND MANAGEMENT OF GASTROPARESIS. WHEN HAVING REGURGITATION, USE STEP II GASTROPARESIS DIET. OTHERWISE FOLLOW STEP III GASTROPARESIS DIET. USE PROTEIN POWDER 1 SCOOP TO MAKE A MILK SHAKE FOR A SUPPLEMENT THREE TIMES A DAY. ADD CARNATION INSTANT BREAKFAST TO BOOST CALORIES AND PROTEIN IN YOUR SHAKE.  CONTINUE OMEPRAZOLE.  CHANGE TO 30 MINUTES PRIOR TO YOUR MEALS TWICE DAILY FOR 3 MOS THEN GO BACK TO ONCE DAILY. CONTINUE ZOFRAN. OK TO LIBERALIZE DIET IF IT DOES NOT MAKE THE NAUSEA WORSE. ADD CA/VITAMIN D TID.  FOLLOW UP IN 4 MOS.

## 2015-07-19 NOTE — Progress Notes (Signed)
ON RECALL  °

## 2015-07-19 NOTE — Progress Notes (Signed)
Subjective:    Patient ID: Ashley Armstrong, female    DOB: 07-26-1966, 48 y.o.   MRN: [id number]  [name], MD  HPI HAD ONE EPISODE OF LLQ PAIN FOLLWED BY DIARRHEA AND IT LASTED 1 WEEK. HAVING A BM DID NOT MAKE IT BETTER. TOOK IMODIUM AD 4 DAYS STRAIGHT AND IT WENT AWAY. BOWELS NOW: #1 OR 2. NOW HAS BURNING EPIGASTRIC PAIN: SEVER TO MODERATE, MAKES HER BODY SWEAT REAL BAD/FEELS COLD. GOES FROM FRONT TO THE BACK. WORSE: ???. DOESN'T SEEM TO GET WORSE WITH EATING.  BAKES ALL HER FOOD AND DOES USE SEASONING. WAS EATING PIZZA AND MICROWAVE FOODS. CHANGED DIET TO GREEN BEANS AND TURNIPS. NAUSEA: SEVERE OFF AND ON(3-4X/MO). HAD REGURGITATE: 1-2X/MO. HAS A LOT OF ALLERGIES AND LYMPH NODES STAY ENLARGED AND HAS A LOT OF ALLERGIES. TUMS FOR HEARTBURN 3-4X/MO. DROPPED A PEPSI ON LEFT FOOT AND NOW HAS L FOOT PAIN.  PT DENIES FEVER, CHILLS, HEMATOCHEZIA, HEMATEMESIS, vomiting, CHEST PAIN, SHORTNESS OF BREATH,  CHANGE IN BOWEL IN HABITS, OR problems swallowing.  Past Medical History  Diagnosis Date  . Nicotine addiction   . Anxiety   . IDDM (insulin dependent diabetes mellitus)   . Dyslipidemia   . Headache(784.0)   . TIA (transient ischemic attack)     Dr. Merlene Laughter  . Gastroparesis due to DM OCT 2014    75% AT 2 HRS, GLU >   . Neuropathy    Past Surgical History  Procedure Laterality Date  . Laser surgery bilateral eye    . Esophagogastroduodenoscopy N/A 08/31/2013    SLF: 1. Earky satiety nausea may be due to Gastroparesis/pyloric channel stenosis. 2. small hiatal hernia 3. Moderate erosive gastritis.    No Known Allergies  Current Outpatient Prescriptions  Medication Sig Dispense Refill  . diazepam (VALIUM) 10 MG tablet Take 10 mg by mouth daily as needed for anxiety.     . DULoxetine (CYMBALTA) 60 MG capsule Take 1 capsule (60 mg total) by mouth daily.    Marland Kitchen glucose blood (ACCU-CHEK AVIVA) test strip Use as instructed four times daily testing E11.65    . LANTUS SOLOSTAR 100  UNIT/ML Solostar Pen INJECT 50 UNITS INTO THE SKIN DAILY AT 10 PM    . lisinopril (PRINIVIL,ZESTRIL) 2.5 MG tablet TAKE ONE (1) TABLET BY MOUTH EVERY DAY    . mometasone (NASONEX) 50 MCG/ACT nasal spray Place 2 sprays into the nose daily. (Patient taking differently: Place 2 sprays into the nose daily as needed (Allergies). )    . montelukast (SINGULAIR) 10 MG tablet Take 1 tablet (10 mg total) by mouth at bedtime. (Patient taking differently: Take 10 mg by mouth daily. )    . NOVOLOG FLEXPEN 100 UNIT/ML FlexPen TAKE 10 TO 20 UNITS 3 TIMES A DAY WITH MEALS    . omeprazole (PRILOSEC) 40 MG capsule TAKE ONE CAPSULE 30 MINUTES BEFORE BREAKFAST    . ondansetron (ZOFRAN) 4 MG tablet Take 1 tablet (4 mg total) by mouth every 8 (eight) hours as needed for nausea or vomiting.    . pravastatin (PRAVACHOL) 80 MG tablet Take 1 tablet (80 mg total) by mouth every evening. (Patient taking differently: Take 80 mg by mouth daily. )    . traMADol-acetaminophen (ULTRACET) 37.5-325 MG per tablet Take 1 tablet by mouth every 4 (four) hours as needed.     Review of Systems PER HPI OTHERWISE ALL SYSTEMS ARE NEGATIVE.     Objective:   Physical Exam  Assessment & Plan:

## 2015-07-19 NOTE — Patient Instructions (Addendum)
WHEN HAVING REGURGITATION, USE STEP II GASTROPARESIS DIET. OTHERWISE FOLLOW STEP III GASTROPARESIS DIET.  USE PROTEIN POWDER 1 SCOOP TO MAKE A MILK SHAKE FOR A SUPPLEMENT THREE TIMES A DAY. ADD CARNATION INSTANT BREAKFAST TO BOOST CALORIES AND PROTEIN IN YOUR SHAKE.   ADD CALCIUM TABLETS WITH VITAMIN D THREE TIMES A DAY TO PREVENT OSTEOPOROSIS.  IT IS OK TO LIBERALIZE DIET IF IT DOES NOT MAKE THE NAUSEA WORSE.  CONTINUE OMEPRAZOLE.  CHANGE TO 30 MINUTES PRIOR TO YOUR MEALS TWICE DAILY FOR 3 MOS THEN GO BACK TO ONCE DAILY.  CONTINUE ZOFRAN.  FOLLOW UP IN 4 MOS.

## 2015-07-19 NOTE — Progress Notes (Signed)
cc'ed to pcp °

## 2015-07-20 ENCOUNTER — Encounter: Payer: Self-pay | Admitting: Podiatry

## 2015-07-20 ENCOUNTER — Ambulatory Visit (INDEPENDENT_AMBULATORY_CARE_PROVIDER_SITE_OTHER): Payer: Medicaid Other

## 2015-07-20 ENCOUNTER — Ambulatory Visit (INDEPENDENT_AMBULATORY_CARE_PROVIDER_SITE_OTHER): Payer: Medicaid Other | Admitting: Podiatry

## 2015-07-20 VITALS — BP 118/69 | HR 72 | Resp 12

## 2015-07-20 DIAGNOSIS — R52 Pain, unspecified: Secondary | ICD-10-CM | POA: Diagnosis not present

## 2015-07-20 DIAGNOSIS — S92912A Unspecified fracture of left toe(s), initial encounter for closed fracture: Secondary | ICD-10-CM | POA: Diagnosis not present

## 2015-07-20 DIAGNOSIS — E1142 Type 2 diabetes mellitus with diabetic polyneuropathy: Secondary | ICD-10-CM

## 2015-07-20 NOTE — Progress Notes (Signed)
She presents today chief complaint of painful fourth and fifth digits of the left foot. States they've been swollen for the past few weeks denies any trauma. States that her diabetes is not well controlled and that she is numb to the level of the knee.  Objective: I reviewed her past medical history medications allergy surgery social history resistance. Pulses remain palpable bilateral. Decreased sensorium per Semmes-Weinstein monofilament bilateral sensation deep tendon reflexes are not elicitable. Muscle strength +5 over 5 dorsiflexion plantar flexors and inverters everters onto the musculature is intact. Orthopedic evaluation demonstrates fracture of the proximal phalanges fourth and fifth digits left foot nondisplaced non-comminuted. There is no bone healing as of yet so this is reasonably fresh.  Assessment: Fractured fourth and fifth digits of the left foot.  Plan: Encouraged patient to wear a Darco shoe for the next month I will follow up with her at that time.

## 2015-07-24 ENCOUNTER — Telehealth: Payer: Self-pay | Admitting: *Deleted

## 2015-07-24 DIAGNOSIS — E785 Hyperlipidemia, unspecified: Secondary | ICD-10-CM

## 2015-07-24 DIAGNOSIS — E1165 Type 2 diabetes mellitus with hyperglycemia: Secondary | ICD-10-CM

## 2015-07-24 DIAGNOSIS — E559 Vitamin D deficiency, unspecified: Secondary | ICD-10-CM

## 2015-07-24 DIAGNOSIS — IMO0001 Reserved for inherently not codable concepts without codable children: Secondary | ICD-10-CM

## 2015-07-24 DIAGNOSIS — Z794 Long term (current) use of insulin: Secondary | ICD-10-CM

## 2015-07-24 NOTE — Telephone Encounter (Signed)
Pt is rescheduled for 08/14/15 pt states Dr. Milinda Pointer [facility] told her she has broke toes and she needs a bone density test done soon as possible pt said her bones are wasting away and her toes stays swollen, I made pt aware that I would send a message back to the nurse but the bone density will be discussed at her office visit.

## 2015-07-24 NOTE — Telephone Encounter (Signed)
Patient aware that this will be discussed at ov due to insurance coverage.  Labs ordered to be done prior to oct visit but after sept 23

## 2015-07-27 ENCOUNTER — Other Ambulatory Visit: Payer: Self-pay

## 2015-07-27 ENCOUNTER — Encounter: Payer: Self-pay | Admitting: Nutrition

## 2015-07-27 ENCOUNTER — Encounter: Payer: Medicaid Other | Attending: "Endocrinology | Admitting: Nutrition

## 2015-07-27 ENCOUNTER — Other Ambulatory Visit: Payer: Self-pay | Admitting: Family Medicine

## 2015-07-27 VITALS — Ht 68.0 in | Wt 119.0 lb

## 2015-07-27 DIAGNOSIS — Z713 Dietary counseling and surveillance: Secondary | ICD-10-CM | POA: Diagnosis not present

## 2015-07-27 DIAGNOSIS — E109 Type 1 diabetes mellitus without complications: Secondary | ICD-10-CM | POA: Diagnosis present

## 2015-07-27 DIAGNOSIS — R636 Underweight: Secondary | ICD-10-CM | POA: Diagnosis not present

## 2015-07-27 DIAGNOSIS — E1065 Type 1 diabetes mellitus with hyperglycemia: Secondary | ICD-10-CM

## 2015-07-27 MED ORDER — INSULIN ASPART 100 UNIT/ML FLEXPEN: PEN_INJECTOR | SUBCUTANEOUS | Status: DC

## 2015-07-27 NOTE — Patient Instructions (Addendum)
  Goals 1. Follow the Plate Method 2. Cut out snacks. 3. Eat 3 carb choices per meal   4. Take Lantus 27 units at 8 - 9 pm at night daily.  5. Eat meal times as discussed. 6. Cut out diet sodas. 7.  Get A1c down to 7% in three months. 8 Exercise 15-30 minutes a day

## 2015-07-27 NOTE — Progress Notes (Signed)
Diabetes Self-Management Education  Visit Type: First/Initial  Appt. Start Time: 9:00 Appt. End Time: 10:30  07/27/2015  [name], identified by name and date of birth, is a 49 y.o. female with a diagnosis of Diabetes: Type 1.. She lives with a friend. Has recently been working on eating better and getting blood sugars better. Fractured some toes in her left food. Walking with a boot right now. Has numeroud chronic medical problems of neuropathy, retinopathy, gastroparesis. Hasn't been compliant with taking insulin as prescribed all the time. Has avoided carbs because she thought they would make her BS too high.   ASSESSMENT  Height 5\' 8"  (1.727 m), weight 119 lb (53.978 kg), last menstrual period 07/19/2013. Body mass index is 18.1 kg/(m^2).      Diabetes Self-Management Education - 07/27/15 1022    Initial Visit   Are you currently following a meal plan? No   Are you taking your medications as prescribed? Yes  Not compliant with insulin at times.   Date Diagnosed 30 years agao   Psychosocial Assessment   Patient Belief/Attitude about Diabetes Motivated to manage diabetes   Self-care barriers Unsteady gait/risk for falls;Lack of transportation   Self-management support Doctor's office;Friends;Family   Other persons present Patient   Patient Concerns Nutrition/Meal planning;Healthy Lifestyle;Problem Solving;Weight Control   Special Needs None   Preferred Learning Style No preference indicated   Learning Readiness Ready   How often do you need to have someone help you when you read instructions, pamphlets, or other written materials from your doctor or pharmacy? 1 - Never   Complications   Number of hypoglycemic episodes per month 2   Can you tell when your blood sugar is low? Yes   Number of hyperglycemic episodes per week 10   Can you tell when your blood sugar is high? Yes   What do you do if your blood sugar is high? Drink water   Dietary Intake   Breakfast Egg, 1  slice toast and 1/2 glass of milk   Snack (morning) nabs   Lunch chicken, green beans, 1 slice bread and peaches 1/2 c, Dt [employer] (afternoon) chese/crackers, nabs or ??   Dinner Chicken, green beans, water, peaches, 1 slice bread, Dt. [employer]   Snack (evening) nabs   Beverage(s) water or diet sodas   Exercise   Exercise Type Light (walking / raking leaves);ADL's   How many days per week to you exercise? 0   How many minutes per day do you exercise? 0   Total minutes per week of exercise 0   Patient Education   Previous Diabetes Education No   Disease state  Definition of diabetes, type 1 and 2, and the diagnosis of diabetes;Factors that contribute to the development of diabetes   Nutrition management  Role of diet in the treatment of diabetes and the relationship between the three main macronutrients and blood glucose level;Carbohydrate counting;Reviewed blood glucose goals for pre and post meals and how to evaluate the patients' food intake on their blood glucose level.;Meal timing in regards to the patients' current diabetes medication.;Meal options for control of blood glucose level and chronic complications.   Physical activity and exercise  Role of exercise on diabetes management, blood pressure control and cardiac health.;Helped patient identify appropriate exercises in relation to his/her diabetes, diabetes complications and other health issue.   Medications Reviewed patients medication for diabetes, action, purpose, timing of dose and side effects.;Reviewed medication adjustment guidelines for hyperglycemia and  sick days.   Monitoring Purpose and frequency of SMBG.;Taught/discussed recording of test results and interpretation of SMBG.;Interpreting lab values - A1C, lipid, urine microalbumina.;Identified appropriate SMBG and/or A1C goals.;Daily foot exams;Yearly dilated eye exam   Acute complications Trained/discussed glucagon administration to patient and designated other.;Taught  treatment of hypoglycemia - the 15 rule.;Discussed and identified patients' treatment of hyperglycemia.   Chronic complications Relationship between chronic complications and blood glucose control;Retinopathy and reason for yearly dilated eye exams;Identified and discussed with patient  current chronic complications;Nephropathy, what it is, prevention of, the use of ACE, ARB's and early detection of through urine microalbumia.   Psychosocial adjustment Worked with patient to identify barriers to care and solutions;Identified and addressed patients feelings and concerns about diabetes   Personal strategies to promote health Lifestyle issues that need to be addressed for better diabetes care;Helped patient develop diabetes management plan for (enter comment)   Individualized Goals (developed by patient)   Nutrition Follow meal plan discussed;General guidelines for healthy choices and portions discussed;Adjust meds/carbs with exercise as discussed;Other (comment)  Take Insulin as prescribed   Physical Activity Exercise 3-5 times per week   Medications take my medication as prescribed   Monitoring  test my blood glucose as discussed;test blood glucose pre and post meals as discussed;Other (comment)  Follow sliding scale as prescribed    Reducing Risk examine blood glucose patterns;do foot checks daily;check ketones if blood glucose over 240mg /dL;treat hypoglycemia with 15 grams of carbs if blood glucose less than 70mg /dL;increase portions of nuts and seeds;increase portions of healthy fats   Health Coping --  Counselor with depression   Outcomes   Expected Outcomes Demonstrated interest in learning. Expect positive outcomes   Future DMSE 4-6 wks   Program Status Completed      Individualized Plan for Diabetes Self-Management Training:   Learning Objective:  Patient will have a greater understanding of diabetes self-management. Patient education plan is to attend individual and/or group sessions  per assessed needs and concerns.   Plan:   Patient Instructions   Goals 1. Follow the Plate Method 2. Cut out snacks between meals. 3. Eat 3 carb choices(45 grams) per meal   4. Take Lantus 27 units at 8 - 9 pm at night daily.  5. Eat at  meal times as discussed. 6. Cut out diet sodas. 7.  Get A1c down to 7% in three months. 8 Exercise 15-30 minutes a day    Expected Outcomes:  Demonstrated interest in learning. Expect positive outcomes  Education material provided: Living Well with Diabetes, A1C conversion sheet, Meal plan card, My Plate and Carbohydrate counting sheet  If problems or questions, patient to contact team via:  Phone  Future DSME appointment: 4-6 wks

## 2015-08-01 ENCOUNTER — Ambulatory Visit: Payer: Medicaid Other | Admitting: Family Medicine

## 2015-08-07 LAB — COMPLETE METABOLIC PANEL WITH GFR
ALBUMIN: 3.8 g/dL (ref 3.6–5.1)
ALK PHOS: 84 U/L (ref 33–115)
ALT: 18 U/L (ref 6–29)
AST: 18 U/L (ref 10–35)
BILIRUBIN TOTAL: 0.4 mg/dL (ref 0.2–1.2)
BUN: 19 mg/dL (ref 7–25)
CO2: 30 mmol/L (ref 20–31)
Calcium: 9.3 mg/dL (ref 8.6–10.2)
Chloride: 104 mmol/L (ref 98–110)
Creat: 1.2 mg/dL — ABNORMAL HIGH (ref 0.50–1.10)
GFR, EST NON AFRICAN AMERICAN: 54 mL/min — AB (ref >=60)
GFR, Est African American: 62 mL/min (ref >=60)
Glucose, Bld: 118 mg/dL — ABNORMAL HIGH (ref 65–99)
Potassium: 3.9 mmol/L (ref 3.5–5.3)
Sodium: 141 mmol/L (ref 135–146)
TOTAL PROTEIN: 6.4 g/dL (ref 6.1–8.1)

## 2015-08-07 LAB — CBC
HCT: 36.8 % (ref 36.0–46.0)
Hemoglobin: 12.3 g/dL (ref 12.0–15.0)
MCH: 28.3 pg (ref 26.0–34.0)
MCHC: 33.4 g/dL (ref 30.0–36.0)
MCV: 84.6 fL (ref 78.0–100.0)
MPV: 9.9 fL (ref 8.6–12.4)
PLATELETS: 256 10*3/uL (ref 150–400)
RBC: 4.35 MIL/uL (ref 3.87–5.11)
RDW: 13.5 % (ref 11.5–15.5)
WBC: 6.5 10*3/uL (ref 4.0–10.5)

## 2015-08-07 LAB — TSH: TSH: 3.289 u[IU]/mL (ref 0.350–4.500)

## 2015-08-07 LAB — HEMOGLOBIN A1C
HEMOGLOBIN A1C: 7.9 % — AB (ref <5.7)
Mean Plasma Glucose: 180 mg/dL — ABNORMAL HIGH (ref <117)

## 2015-08-07 LAB — MICROALBUMIN / CREATININE URINE RATIO
Creatinine, Urine: 95 mg/dL
Microalb Creat Ratio: 188.4 mg/g — ABNORMAL HIGH (ref 0.0–30.0)
Microalb, Ur: 17.9 mg/dL — ABNORMAL HIGH (ref <2.0)

## 2015-08-08 ENCOUNTER — Other Ambulatory Visit: Payer: Self-pay

## 2015-08-08 LAB — VITAMIN D 25 HYDROXY (VIT D DEFICIENCY, FRACTURES): VIT D 25 HYDROXY: 28 ng/mL — AB (ref 30–100)

## 2015-08-08 MED ORDER — INSULIN GLARGINE 100 UNIT/ML SOLOSTAR PEN: PEN_INJECTOR | SUBCUTANEOUS | Status: DC

## 2015-08-10 ENCOUNTER — Ambulatory Visit (INDEPENDENT_AMBULATORY_CARE_PROVIDER_SITE_OTHER): Payer: Medicaid Other

## 2015-08-10 ENCOUNTER — Ambulatory Visit (INDEPENDENT_AMBULATORY_CARE_PROVIDER_SITE_OTHER): Payer: Medicaid Other | Admitting: Podiatry

## 2015-08-10 ENCOUNTER — Encounter: Payer: Self-pay | Admitting: Podiatry

## 2015-08-10 VITALS — BP 141/85 | HR 95 | Resp 16

## 2015-08-10 DIAGNOSIS — M722 Plantar fascial fibromatosis: Secondary | ICD-10-CM | POA: Diagnosis not present

## 2015-08-10 DIAGNOSIS — E1142 Type 2 diabetes mellitus with diabetic polyneuropathy: Secondary | ICD-10-CM | POA: Diagnosis not present

## 2015-08-10 DIAGNOSIS — S92912D Unspecified fracture of left toe(s), subsequent encounter for fracture with routine healing: Secondary | ICD-10-CM

## 2015-08-11 ENCOUNTER — Other Ambulatory Visit: Payer: Self-pay | Admitting: Family Medicine

## 2015-08-11 NOTE — Progress Notes (Signed)
She presents today concerned of painful fourth and fifth toes of the left foot as well as painful second digit and discoloration left foot. She states that she doesn't understand why her foot still hurts when her blood sugars have been on the decrease.  Objective: Vital signs are stable she is alert and oriented 3 pulses remain barely palpable left lower extremity capillary fill time is immediate and the foot is warm to touch. She does have positive pain on palpation medial calcaneal tubercle of the left heel. The toes do not demonstrate any type of edema erythema cellulitis drainage or odor. There is no discoloration or ecchymosis noted radiographs demonstrate well healing fourth and fifth digital fractures. No fractures noted to the second digit left. She does have soft tissue increase in density of the plantar fascial calcaneal insertion site indicative of plantar fasciitis.  Assessment: Moderate to severe diabetic peripheral neuropathy and angiopathy left foot. Plantar fasciitis left foot. Healing fractures fourth and fifth digits of the left foot. Contusion toe second digit left foot.  Plan: Encouraged to continue use of the Darco shoe if necessary however I did inject the left heel today with Kenalog and local and aesthetic. I explained to her that she still will have tenderness in her toes no matter what particularly in the forefoot secondary to the diabetic peripheral neuropathy.

## 2015-08-13 NOTE — Assessment & Plan Note (Signed)
Uncontrolled, needs to be treated by endo

## 2015-08-13 NOTE — Progress Notes (Signed)
Pt not evaluated on this day. Appointment cancelled

## 2015-08-14 ENCOUNTER — Ambulatory Visit: Payer: Medicaid Other | Admitting: Family Medicine

## 2015-08-16 ENCOUNTER — Ambulatory Visit: Payer: Medicaid Other | Admitting: Family Medicine

## 2015-08-22 ENCOUNTER — Encounter: Payer: Self-pay | Admitting: Gastroenterology

## 2015-08-29 ENCOUNTER — Ambulatory Visit (INDEPENDENT_AMBULATORY_CARE_PROVIDER_SITE_OTHER): Payer: Medicaid Other | Admitting: Family Medicine

## 2015-08-29 ENCOUNTER — Encounter: Payer: Self-pay | Admitting: Family Medicine

## 2015-08-29 VITALS — BP 108/74 | HR 83 | Resp 16 | Ht 68.0 in | Wt 120.0 lb

## 2015-08-29 DIAGNOSIS — E785 Hyperlipidemia, unspecified: Secondary | ICD-10-CM

## 2015-08-29 DIAGNOSIS — E10628 Type 1 diabetes mellitus with other skin complications: Secondary | ICD-10-CM | POA: Diagnosis not present

## 2015-08-29 DIAGNOSIS — F418 Other specified anxiety disorders: Secondary | ICD-10-CM

## 2015-08-29 DIAGNOSIS — Z23 Encounter for immunization: Secondary | ICD-10-CM

## 2015-08-29 DIAGNOSIS — E1065 Type 1 diabetes mellitus with hyperglycemia: Secondary | ICD-10-CM

## 2015-08-29 DIAGNOSIS — F411 Generalized anxiety disorder: Secondary | ICD-10-CM

## 2015-08-29 DIAGNOSIS — J302 Other seasonal allergic rhinitis: Secondary | ICD-10-CM

## 2015-08-29 DIAGNOSIS — IMO0002 Reserved for concepts with insufficient information to code with codable children: Secondary | ICD-10-CM

## 2015-08-29 MED ORDER — GLUCOSE BLOOD VI STRP: ORAL_STRIP | Status: DC

## 2015-08-29 NOTE — Patient Instructions (Addendum)
F/u Dec 28 or after, cvall if you need me before  CONGRATS on improved blood sugar, next goal is 7.5 or less, you CAN do this.  Test 4 times daily, record, follow eating plan  Goal for fasting blood sugar ranges from 80 to 130 and 2 hours after any meal or at bedtime should be between 130 to 180.  Increase lantus to 40 units for this next week, then increase again to 45 units daily, the week starting Sep 11, 2015  You are referred for mammogram  Flu vaccine today  Thanks for choosing [facility], we consider it a privelige to serve you.  Labs 12/27 or after

## 2015-09-04 ENCOUNTER — Ambulatory Visit: Payer: Medicaid Other | Admitting: Nutrition

## 2015-09-06 IMAGING — US US ABDOMEN LIMITED
1 series · 14 of 25 positions shown · non-contrast
Comparison: CT 05/09/2013.  Ultrasound 07/09/2013.

CLINICAL DATA: Dyspepsia.

EXAM:
US ABDOMEN LIMITED - RIGHT UPPER QUADRANT

[Series 1: us abdomen limited · 0.14mm/px · 14 of 72 slices shown]
[im 1/72]
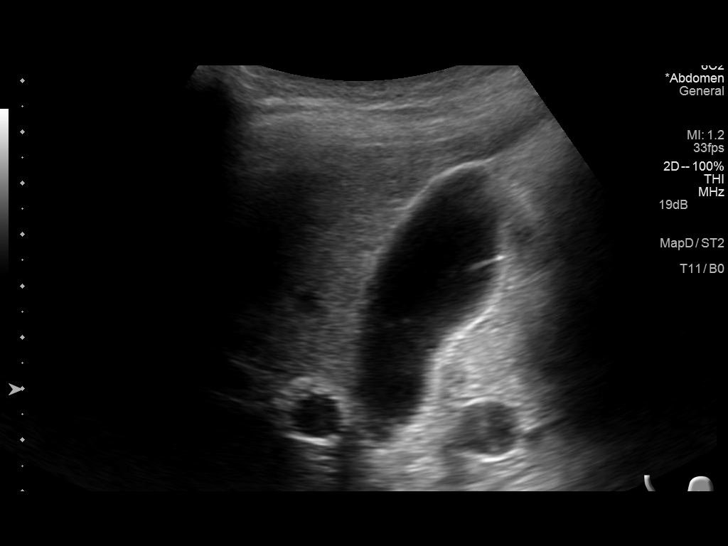
[im 6/72]
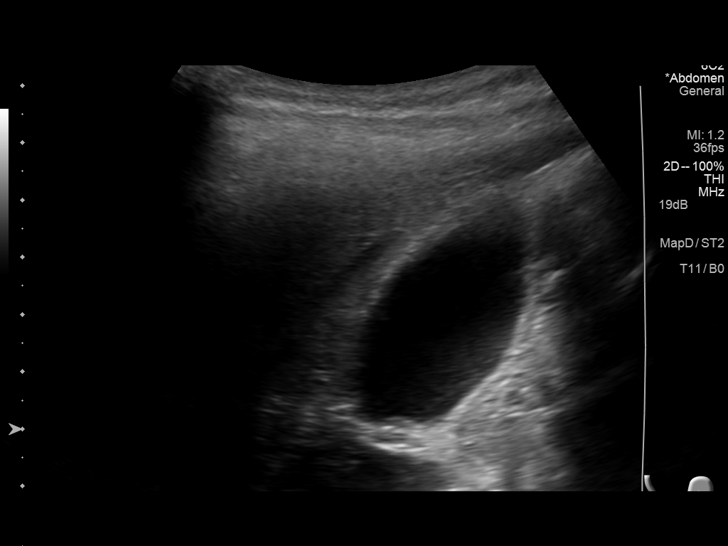
[im 12/72]
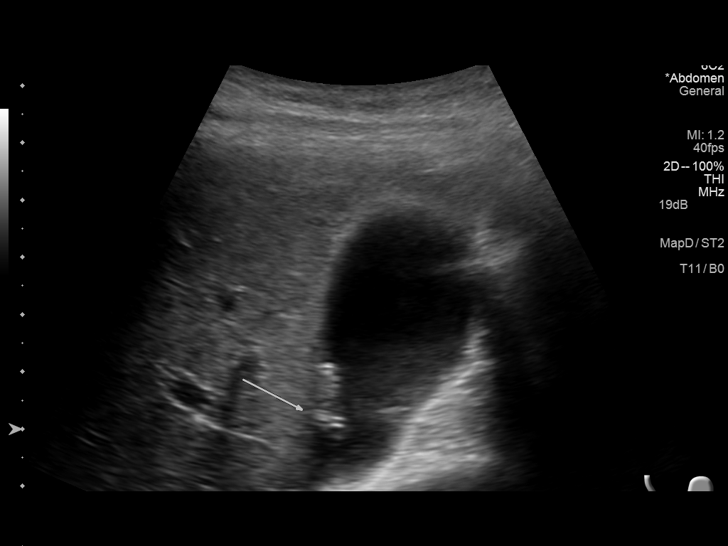
[im 18/72]
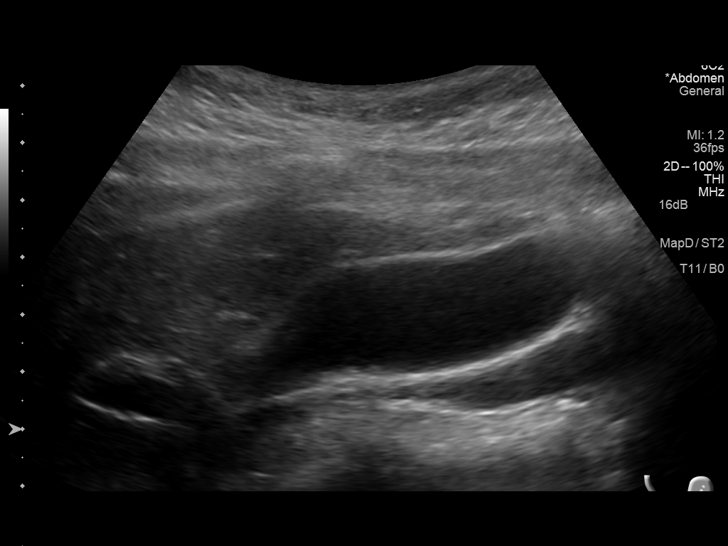
[im 24/72]
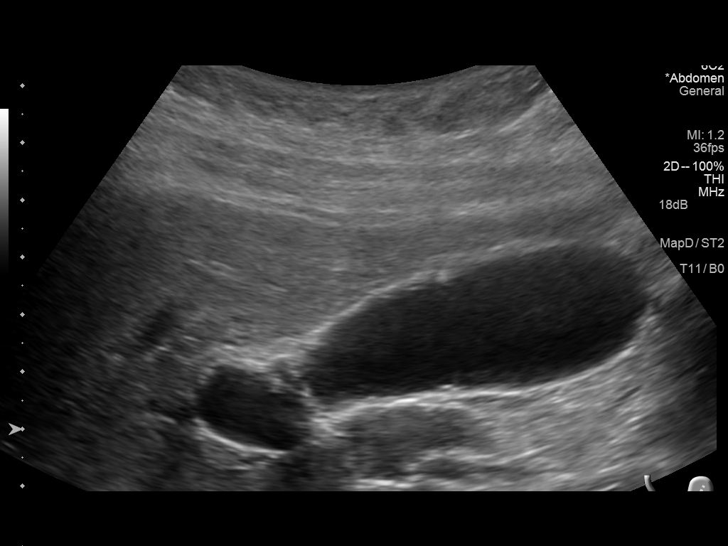
[im 27/72]
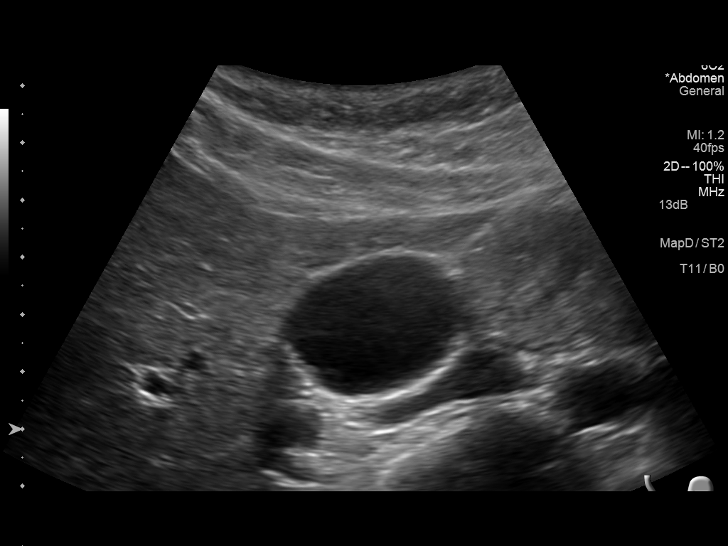
[im 33/72]
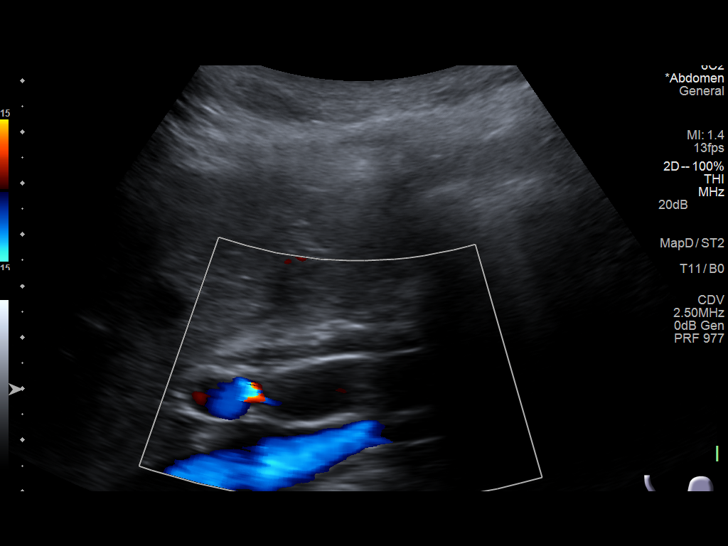
[im 39/72]
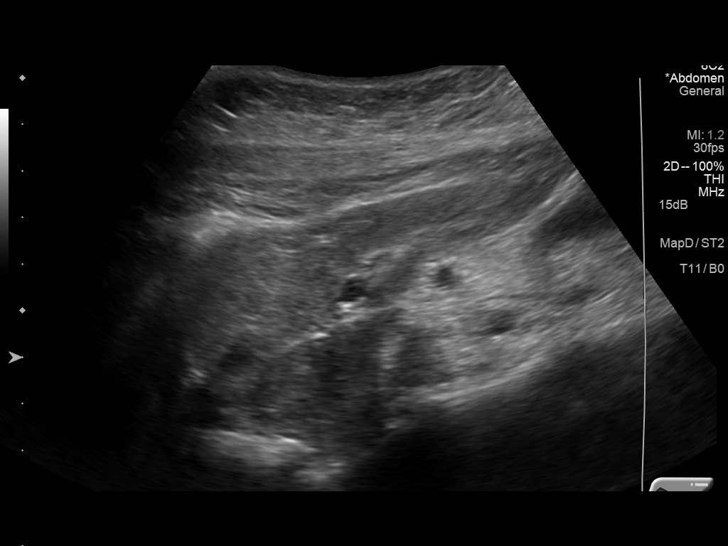
[im 45/72]
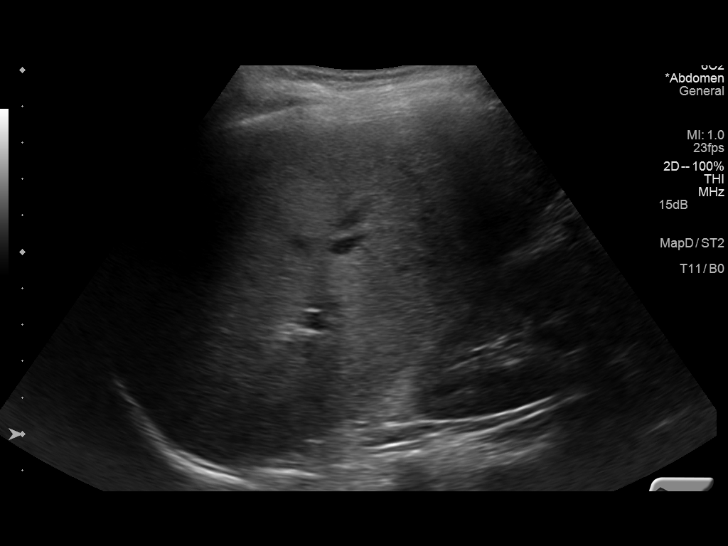
[im 48/72]
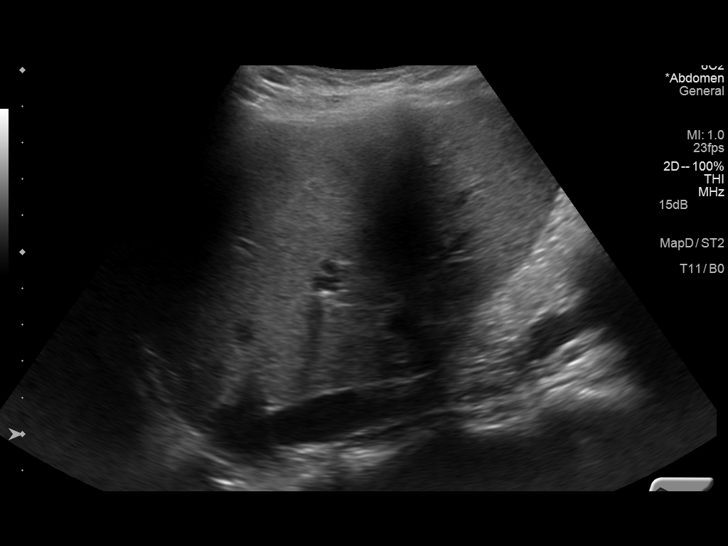
[im 54/72]
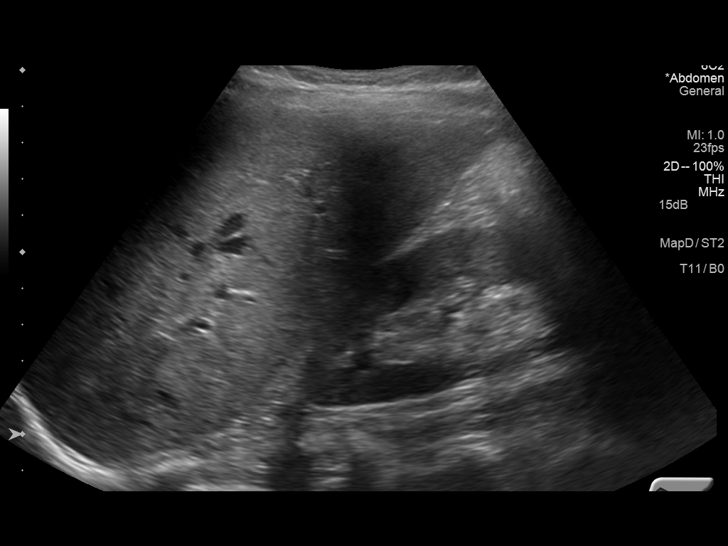
[im 60/72]
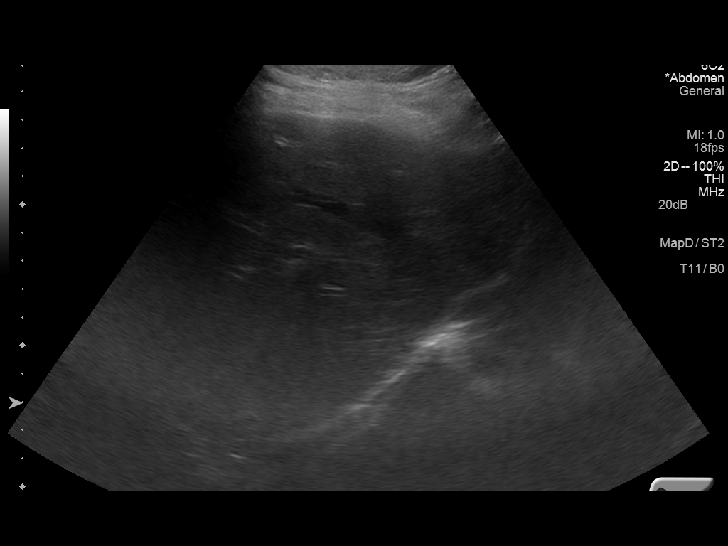
[im 66/72]
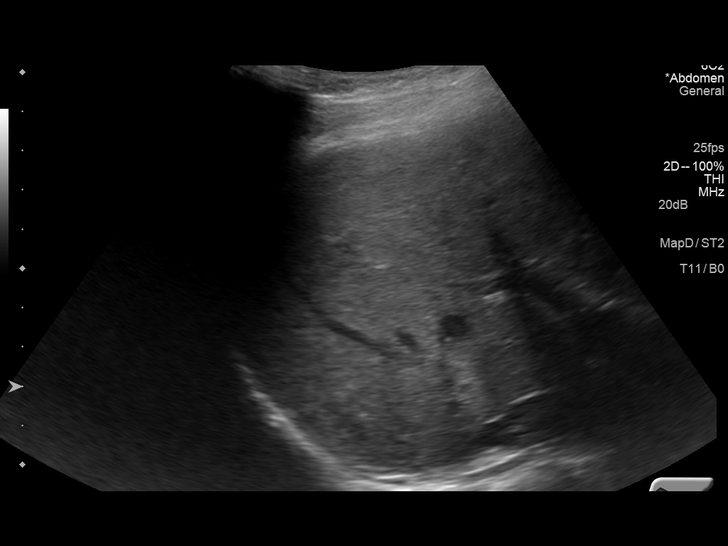
[im 72/72]
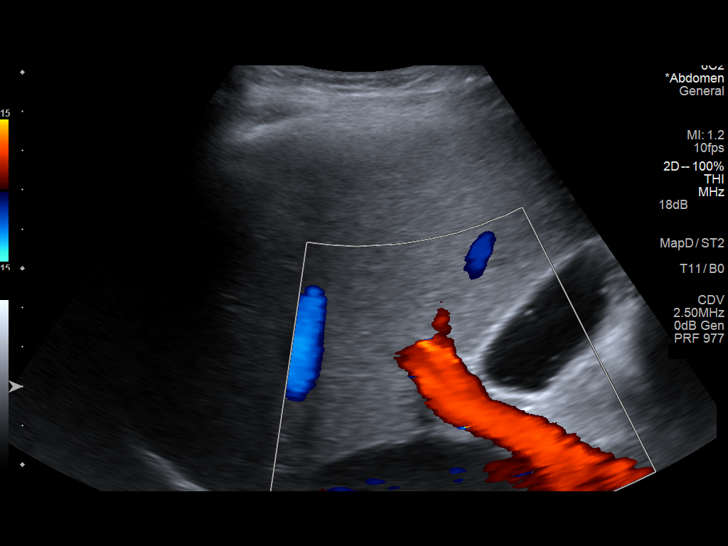

[14 of 25 positions shown; findings below may reference images not displayed]

FINDINGS: Gallbladder:

No gallstones. Findings again noted consistent cholesterolosis. Tiny
2 mm polyp along the the anterior gallbladder wall cannot be
excluded. Gallbladder wall thickness normal at 2 mm. Negative Murphy
sign.

Common bile duct:

Diameter: 5 mm.

Liver:

No focal lesion identified. Within normal limits in parenchymal
echogenicity.
IMPRESSION: 1. Cholesterolosis again noted. Tiny 2 mm polyp most likely present
along the anterior gallbladder wall. No gallstones or gallbladder
wall thickening. Negative Murphy sign.
2. No other focal abnormality identified.

## 2015-09-13 ENCOUNTER — Telehealth: Payer: Self-pay | Admitting: Nutrition

## 2015-09-13 NOTE — Telephone Encounter (Signed)
TC. She said she will call back to reschedule missed and canceled appts with me when she goes home and looks at her calendar. PC

## 2015-09-20 ENCOUNTER — Other Ambulatory Visit: Payer: Self-pay | Admitting: Family Medicine

## 2015-09-20 DIAGNOSIS — Z1231 Encounter for screening mammogram for malignant neoplasm of breast: Secondary | ICD-10-CM

## 2015-09-21 ENCOUNTER — Ambulatory Visit: Payer: Medicaid Other | Admitting: Podiatry

## 2015-09-27 ENCOUNTER — Telehealth: Payer: Self-pay | Admitting: Family Medicine

## 2015-09-27 ENCOUNTER — Ambulatory Visit (HOSPITAL_COMMUNITY): Payer: Medicaid Other

## 2015-09-27 NOTE — Telephone Encounter (Addendum)
Please review importance of adequate hydration, BRAT diet and hand hygiene with patient as symptoms are consistent with acute gastroenteritis. Offer and send if accepted and patient not allergic, zofran 4 mg one twice daily as need for nausea, #12 only;and lomotil one tablet four times daily ,#12 only as needed, for diarrhea.  Offer stool testing for O/P also with history provided please

## 2015-09-27 NOTE — Telephone Encounter (Signed)
Called and left message for patient to return call.  

## 2015-09-27 NOTE — Telephone Encounter (Signed)
Patient states that she has body aches, chills, headache, nausea and diarrhea x 3 days.     Her cat is being treated for an intestinal parasite.  Please advise.

## 2015-09-27 NOTE — Telephone Encounter (Signed)
Patient states that she has had to take her cat to the vet to be treated for coccidia and she is showing symptoms now too, she is asking to be seen, please advise?

## 2015-09-28 ENCOUNTER — Telehealth: Payer: Self-pay | Admitting: Family Medicine

## 2015-09-28 MED ORDER — DIPHENOXYLATE-ATROPINE 2.5-0.025 MG PO TABS
1.0000 | ORAL_TABLET | Freq: Four times a day (QID) | ORAL | Status: DC | PRN
Start: 2015-09-28 — End: 2016-05-23

## 2015-09-28 MED ORDER — ONDANSETRON HCL 4 MG PO TABS: 4.0000 mg | ORAL_TABLET | Freq: Two times a day (BID) | ORAL | Status: DC | PRN

## 2015-09-28 NOTE — Telephone Encounter (Signed)
Ms. Niwa is calling back she was under the impression yesterday that somebody was going to call her in some Theraflu yesterday, shes called the pharmacy and its not there, please advise?

## 2015-09-28 NOTE — Telephone Encounter (Signed)
See previous telephone call

## 2015-09-28 NOTE — Telephone Encounter (Signed)
Patient aware and medications sent to pharmacy.  

## 2015-09-28 NOTE — Addendum Note (Signed)
Addended by: Denman George B on: 09/28/2015 03:36 PM   Modules accepted: Orders

## 2015-09-30 NOTE — Assessment & Plan Note (Signed)
Hyperlipidemia:Low fat diet discussed and encouraged.   Lipid Panel  Lab Results  Component Value Date   CHOL 163 06/01/2014   HDL 62 06/01/2014   LDLCALC 80 06/01/2014   TRIG 106 06/01/2014   CHOLHDL 2.6 06/01/2014      Updated lab needed at/ before next visit.

## 2015-09-30 NOTE — Assessment & Plan Note (Signed)
Controlled, no change in medication  

## 2015-09-30 NOTE — Assessment & Plan Note (Signed)
No current flare , uses med as needed

## 2015-09-30 NOTE — Progress Notes (Signed)
Ashley Armstrong     MRN: [id number]      DOB: [birth year]   HPI Ashley Armstrong is here for follow up and re-evaluation of chronic medical conditions, medication management and review of any available recent lab and radiology data.  Preventive health is updated, specifically  Cancer screening and Immunization.   Questions or concerns regarding consultations or procedures which the PT has had in the interim are  addressed. The PT denies any adverse reactions to current medications since the last visit.  There are no new concerns.  There are no specific complaints   ROS Denies recent fever or chills. Denies sinus pressure, nasal congestion, ear pain or sore throat. Denies chest congestion, productive cough or wheezing. Denies chest pains, palpitations and leg swelling Denies abdominal pain, nausea, vomiting,diarrhea or constipation.   Denies dysuria, frequency, hesitancy or incontinence. Denies joint pain, swelling and limitation in mobility. Denies headaches, seizures, numbness, or tingling. Denies depression, anxiety or insomnia. Denies skin break down or rash.   PE  BP 108/74 mmHg  Pulse 83  Resp 16  Ht 5\' 8"  (1.727 m)  Wt 120 lb (54.432 kg)  BMI 18.25 kg/m2  SpO2 99%  LMP 07/19/2013  Patient alert and oriented and in no cardiopulmonary distress.  HEENT: No facial asymmetry, EOMI,   oropharynx pink and moist.  Neck supple no JVD, no mass.  Chest: Clear to auscultation bilaterally.  CVS: S1, S2 no murmurs, no S3.Regular rate.  ABD: Soft non tender.   Ext: No edema  MS: Adequate ROM spine, shoulders, hips and knees.  Skin: Intact, no ulcerations or rash noted.  Psych: Good eye contact, normal affect. Memory intact not anxious or depressed appearing.  CNS: CN 2-12 intact, power,  normal throughout.no focal deficits noted.   Assessment & Plan   Diabetes mellitus, insulin dependent (IDDM), uncontrolled Marked improvement, pt applauaded on this. Increase dose of  lantus, she is also to follow with educator Ashley Armstrong is reminded of the importance of commitment to daily physical activity for 30 minutes or more, as able and the need to limit carbohydrate intake to 30 to 60 grams per meal to help with blood sugar control.   The need to take medication as prescribed, test blood sugar as directed, and to call between visits if there is a concern that blood sugar is uncontrolled is also discussed.   Ashley Armstrong is reminded of the importance of daily foot exam, annual eye examination, and good blood sugar, blood pressure and cholesterol control.  Diabetic Labs Latest Ref Rng 08/07/2015 05/04/2015 02/02/2015 07/04/2014 06/16/2014  HbA1c <5.7 % 7.9(H) 10.0(H) 10.0(A) - -  Microalbumin <2.0 mg/dL 17.9(H) - - - 70.84(H)  Micro/Creat Ratio 0.0 - 30.0 mg/g 188.4(H) - - - 367.6(H)  Chol 0 - 200 mg/dL - - - - -  HDL >39 mg/dL - - - - -  Calc LDL 0 - 99 mg/dL - - - - -  Triglycerides <150 mg/dL - - - - -  Creatinine 0.50 - 1.10 mg/dL 1.20(H) - - 1.14(H) -   BP/Weight 08/29/2015 08/10/2015 07/27/2015 07/20/2015 07/19/2015 07/11/2015 Q000111Q  Systolic BP 123XX123 Q000111Q - 123456 Q000111Q 123XX123 123XX123  Diastolic BP 74 85 - 69 85 80 70  Wt. (Lbs) 120 - 119 - 117.8 119 122.12  BMI 18.25 - 18.1 - 17.92 18.63 19.12   Foot/eye exam completion dates Latest Ref Rng 06/08/2015 07/08/2014  Eye Exam No Retinopathy - Retinopathy(A)  Foot Form Completion - Done -  Depression with anxiety Controlled, no change in medication   Hyperlipidemia LDL goal <100 Hyperlipidemia:Low fat diet discussed and encouraged.   Lipid Panel  Lab Results  Component Value Date   CHOL 163 06/01/2014   HDL 62 06/01/2014   LDLCALC 80 06/01/2014   TRIG 106 06/01/2014   CHOLHDL 2.6 06/01/2014      Updated lab needed at/ before next visit.   Generalized anxiety disorder Improved and controlled on current meds continue same  Seasonal allergies No current flare , uses med as needed

## 2015-09-30 NOTE — Assessment & Plan Note (Signed)
Improved and controlled on current meds continue same

## 2015-09-30 NOTE — Assessment & Plan Note (Signed)
Marked improvement, pt applauaded on this. Increase dose of lantus, she is also to follow with educator Ms. Stegenga is reminded of the importance of commitment to daily physical activity for 30 minutes or more, as able and the need to limit carbohydrate intake to 30 to 60 grams per meal to help with blood sugar control.   The need to take medication as prescribed, test blood sugar as directed, and to call between visits if there is a concern that blood sugar is uncontrolled is also discussed.   Ms. Nakao is reminded of the importance of daily foot exam, annual eye examination, and good blood sugar, blood pressure and cholesterol control.  Diabetic Labs Latest Ref Rng 08/07/2015 05/04/2015 02/02/2015 07/04/2014 06/16/2014  HbA1c <5.7 % 7.9(H) 10.0(H) 10.0(A) - -  Microalbumin <2.0 mg/dL 17.9(H) - - - 70.84(H)  Micro/Creat Ratio 0.0 - 30.0 mg/g 188.4(H) - - - 367.6(H)  Chol 0 - 200 mg/dL - - - - -  HDL >39 mg/dL - - - - -  Calc LDL 0 - 99 mg/dL - - - - -  Triglycerides <150 mg/dL - - - - -  Creatinine 0.50 - 1.10 mg/dL 1.20(H) - - 1.14(H) -   BP/Weight 08/29/2015 08/10/2015 07/27/2015 07/20/2015 07/19/2015 07/11/2015 Q000111Q  Systolic BP 123XX123 Q000111Q - 123456 Q000111Q 123XX123 123XX123  Diastolic BP 74 85 - 69 85 80 70  Wt. (Lbs) 120 - 119 - 117.8 119 122.12  BMI 18.25 - 18.1 - 17.92 18.63 19.12   Foot/eye exam completion dates Latest Ref Rng 06/08/2015 07/08/2014  Eye Exam No Retinopathy - Retinopathy(A)  Foot Form Completion - Done -

## 2015-10-09 ENCOUNTER — Other Ambulatory Visit: Payer: Self-pay | Admitting: Family Medicine

## 2015-10-09 NOTE — Telephone Encounter (Signed)
Patient is requesting a refill on her Novalog she is completely out, she is asking if she can get more than 2 refills at one time, please advise?

## 2015-10-10 ENCOUNTER — Ambulatory Visit: Payer: Medicaid Other | Admitting: Podiatry

## 2015-10-12 ENCOUNTER — Encounter: Payer: Self-pay | Admitting: Gastroenterology

## 2015-10-12 ENCOUNTER — Ambulatory Visit (INDEPENDENT_AMBULATORY_CARE_PROVIDER_SITE_OTHER): Payer: Medicaid Other | Admitting: Gastroenterology

## 2015-10-12 VITALS — BP 113/71 | HR 80 | Temp 97.9°F | Ht 68.0 in | Wt 120.6 lb

## 2015-10-12 DIAGNOSIS — R194 Change in bowel habit: Secondary | ICD-10-CM

## 2015-10-12 DIAGNOSIS — K3184 Gastroparesis: Secondary | ICD-10-CM

## 2015-10-12 DIAGNOSIS — K5900 Constipation, unspecified: Secondary | ICD-10-CM | POA: Insufficient documentation

## 2015-10-12 NOTE — Progress Notes (Signed)
ON RECALL  °

## 2015-10-12 NOTE — Patient Instructions (Signed)
IF YOU CONSUME DAIRY, ADD LACTASE 3 PILLS WITH MEALS UP TO THREE TIMES A DAY.  USE FIBER POWDER OR 1 PACKET ONCE DAILY FOR 3 DAYS THEN TWICE DAILY . AVOID HIGHER DOSES IF IT CAUSES BLOATING & GAS.  DRINK WATER TO KEEP YOUR URINE LIGHT YELLOW.  EAT 4-6 SMALL MEALS DAILY.  CALL IF YOU WOULD LIKE YOUR STOOL CHECKED.  FOLLOW UP IN 4 MOS.

## 2015-10-12 NOTE — Assessment & Plan Note (Signed)
SYMPTOMS NOT IDEALLY CONTROLLED 7 MOST LIKELY DUE TO LACTOSE INTOLERANCE.  WHEN CONSUMING DAIRY, ADD LACTASE 3 PILLS WITH MEALS UP TO THREE TIMES A DAY. USE FIBER POWDER OR 1 PACKET ONCE DAILY FOR 3 DAYS THEN TWICE DAILY . AVOID HIGHER DOSES IF IT CAUSES BLOATING & GAS. DRINK WATER TO KEEP YOUR URINE LIGHT YELLOW. EAT 4-6 SMALL MEALS DAILY. CALL IF WOULD LIKE  STOOL CHECKED FOR GIARDIA Ag. FOLLOW UP IN 4 MOS.   GREATER THAN 50% WAS SPENT IN COUNSELING & COORDINATION OF CARE WITH THE PATIENT: DISCUSSED DIFFERENTIAL DIAGNOSIS, BENEFITS, AND MANAGEMENT OF GASTROPARESIS/LACTOSE INTOLOLERANCE/CHANE IN BOWEL HABITS TOTAL ENCOUNTER TIME: 25 MINS.

## 2015-10-12 NOTE — Assessment & Plan Note (Signed)
SYMPTOMS FAIRLY WELL CONTROLLED.  CONTINUE TO MONITOR SYMPTOMS. FOLLOW UP IN 4 MOS.  

## 2015-10-12 NOTE — Progress Notes (Signed)
Subjective:    Patient ID: Ashley Armstrong, female    DOB: 05-14-1966, 49 y.o.   MRN: [id number]  Ashley Nakayama, MD  HPI BOWELS CHANGED IN NOV 2016 EITHER HAVING A #1 OR #7. A1C IMPROVED OVER 3 MOS SEP 2016. BAKED CHICKEN GREEN/BEANS/WHITE RICE. MILK: SKIM MILK 2X/WEEK. CHEESE: RIGHT MUCH. ICE CREAM: NO. FELT COLD WITH DIARRHEA. A LITTLE NAUSEA. NOTICED HEART BEATING FAST ONE NIGHT. STOPPED HAVING TROUBLE WITH NOCTURNAL STOOLS 5 DAYS AGO. BMs: CONSTIPATION. NO LAXATIVE. ALWAYS LLQ OFF AND ON(SHARP). BETTER AFTER BM. CAT HAS COCCIDIA AND BEING TREATED. THOUGHT SHE HAD IT. NO TRAVEL/ABX. WELL WATER: YES  PT DENIES FEVER, CHILLS, HEMATOCHEZIA, HEMATEMESIS, nausea, vomiting, melena, CHEST PAIN, SHORTNESS OF BREATH,   problems swallowing, OR heartburn or indigestion. Past Medical History  Diagnosis Date  . Nicotine addiction   . Anxiety   . IDDM (insulin dependent diabetes mellitus) ([place])   . Dyslipidemia   . Headache(784.0)   . TIA (transient ischemic attack)     Dr. Merlene Laughter  . Gastroparesis due to DM ([place]) OCT 2014    75% AT 2 HRS, GLU >   . Neuropathy ([place])     Past Surgical History  Procedure Laterality Date  . Laser surgery bilateral eye    . Esophagogastroduodenoscopy N/A 08/31/2013    SLF: 1. Earky satiety nausea may be due to Gastroparesis/pyloric channel stenosis. 2. small hiatal hernia 3. Moderate erosive gastritis.    No Known Allergies  Current Outpatient Prescriptions  Medication Sig Dispense Refill  . diazepam (VALIUM) 10 MG tablet Take 10 mg by mouth daily as needed for anxiety.     . diphenoxylate-atropine (LOMOTIL) 2.5-0.025 MG tablet Take 1 tablet by mouth 4 (four) times daily as needed for diarrhea or loose stools.    . DULoxetine (CYMBALTA) 60 MG capsule Take 1 capsule (60 mg total) by mouth daily.    Marland Kitchen glucose blood (ACCU-CHEK AVIVA) test strip Use as instructed four times daily testing E11.65    . Insulin Glargine (LANTUS SOLOSTAR) 100 UNIT/ML Solostar Pen  INJECT 50 UNITS INTO THE SKIN DAILY AT 10 PM (Patient taking differently: 40 Units. INJECT 50 UNITS INTO THE SKIN DAILY AT 10 PM)    . lisinopril (PRINIVIL,ZESTRIL) 2.5 MG tablet TAKE ONE (1) TABLET BY MOUTH EVERY DAY    . mometasone (NASONEX) 50 MCG/ACT nasal spray Place 2 sprays into the nose daily. (Patient taking differently: Place 2 sprays into the nose daily as needed (Allergies). )    . montelukast (SINGULAIR) 10 MG tablet Take 1 tablet (10 mg total) by mouth at bedtime. (Patient taking differently: Take 10 mg by mouth daily. )    . NOVOLOG FLEXPEN 100 UNIT/ML FlexPen TAKE 10 TO 20 UNITS 3 TIMES A DAY WITH MEALS    . omeprazole (PRILOSEC) 40 MG capsule 1 po 30 mins prior to breakfast and supper    . ondansetron (ZOFRAN) 4 MG tablet Take 1 tablet (4 mg total) by mouth every 8 (eight) hours as needed for nausea or vomiting.    .      . pravastatin (PRAVACHOL) 80 MG tablet TAKE ONE TABLET BY MOUTH IN THE EVENING.    . traMADol-acetaminophen (ULTRACET) 37.5-325 MG per tablet Take 1 tablet by mouth every 4 (four) hours as needed. RARE    Review of Systems PER HPI OTHERWISE ALL SYSTEMS ARE NEGATIVE.    Objective:   Physical Exam  Constitutional: She is oriented to person, place, and time. She appears well-developed and  well-nourished. No distress.  HENT:  Head: Normocephalic and atraumatic.  Mouth/Throat: Oropharynx is clear and moist. No oropharyngeal exudate.  Eyes: Pupils are equal, round, and reactive to light. No scleral icterus.  Neck: Normal range of motion. Neck supple.  Cardiovascular: Normal rate, regular rhythm and normal heart sounds.   Pulmonary/Chest: Effort normal and breath sounds normal. No respiratory distress.  Abdominal: Soft. Bowel sounds are normal. She exhibits no distension. There is no tenderness.  Musculoskeletal: She exhibits no edema.  Lymphadenopathy:    She has no cervical adenopathy.  Neurological: She is alert and oriented to person, place, and time.    Psychiatric: She has a normal mood and affect.  Vitals reviewed.         Assessment & Plan:

## 2015-10-12 NOTE — Progress Notes (Signed)
cc'ed to pcp °

## 2015-10-13 ENCOUNTER — Other Ambulatory Visit: Payer: Self-pay | Admitting: Family Medicine

## 2015-10-19 ENCOUNTER — Ambulatory Visit: Payer: Medicaid Other | Admitting: Family Medicine

## 2015-10-30 ENCOUNTER — Telehealth: Payer: Self-pay | Admitting: Family Medicine

## 2015-10-30 NOTE — Telephone Encounter (Signed)
Patient is asking for something called in for Ringworm on her face, please advise?

## 2015-10-31 ENCOUNTER — Other Ambulatory Visit: Payer: Self-pay

## 2015-10-31 MED ORDER — CLOTRIMAZOLE 1 % EX CREA: 1.0000 "application " | TOPICAL_CREAM | Freq: Two times a day (BID) | CUTANEOUS | Status: DC

## 2015-10-31 NOTE — Telephone Encounter (Signed)
Has a ringworm on her face, wants to know if there is something OTC she can use of if something has to be called in?

## 2015-10-31 NOTE — Telephone Encounter (Signed)
Yes, clotrimazole cream or any antifungal OTC cream, eg lamisil, let the pharmacist help her when she gets there

## 2015-10-31 NOTE — Telephone Encounter (Signed)
Wanted it sent as rx so medicaid would pay for it

## 2015-11-10 LAB — COMPLETE METABOLIC PANEL WITH GFR
ALT: 24 U/L (ref 6–29)
AST: 23 U/L (ref 10–35)
Albumin: 3.8 g/dL (ref 3.6–5.1)
Alkaline Phosphatase: 74 U/L (ref 33–115)
BUN: 22 mg/dL (ref 7–25)
CHLORIDE: 102 mmol/L (ref 98–110)
CO2: 28 mmol/L (ref 20–31)
Calcium: 9.2 mg/dL (ref 8.6–10.2)
Creat: 1 mg/dL (ref 0.50–1.10)
GFR, Est African American: 77 mL/min (ref >=60)
GFR, Est Non African American: 67 mL/min (ref >=60)
GLUCOSE: 185 mg/dL — AB (ref 65–99)
POTASSIUM: 4.5 mmol/L (ref 3.5–5.3)
SODIUM: 139 mmol/L (ref 135–146)
Total Bilirubin: 0.4 mg/dL (ref 0.2–1.2)
Total Protein: 6.5 g/dL (ref 6.1–8.1)

## 2015-11-10 LAB — HEMOGLOBIN A1C
Hgb A1c MFr Bld: 8.3 % — ABNORMAL HIGH (ref <5.7)
Mean Plasma Glucose: 192 mg/dL — ABNORMAL HIGH (ref <117)

## 2015-11-14 ENCOUNTER — Ambulatory Visit (INDEPENDENT_AMBULATORY_CARE_PROVIDER_SITE_OTHER): Payer: Medicaid Other | Admitting: Family Medicine

## 2015-11-14 ENCOUNTER — Encounter: Payer: Self-pay | Admitting: Family Medicine

## 2015-11-14 VITALS — BP 120/80 | HR 90 | Resp 16 | Ht 68.0 in | Wt 121.4 lb

## 2015-11-14 DIAGNOSIS — M79602 Pain in left arm: Secondary | ICD-10-CM

## 2015-11-14 DIAGNOSIS — E10628 Type 1 diabetes mellitus with other skin complications: Secondary | ICD-10-CM | POA: Diagnosis not present

## 2015-11-14 DIAGNOSIS — F418 Other specified anxiety disorders: Secondary | ICD-10-CM

## 2015-11-14 DIAGNOSIS — E785 Hyperlipidemia, unspecified: Secondary | ICD-10-CM

## 2015-11-14 DIAGNOSIS — E1065 Type 1 diabetes mellitus with hyperglycemia: Secondary | ICD-10-CM

## 2015-11-14 DIAGNOSIS — IMO0002 Reserved for concepts with insufficient information to code with codable children: Secondary | ICD-10-CM

## 2015-11-14 DIAGNOSIS — K3184 Gastroparesis: Secondary | ICD-10-CM | POA: Diagnosis not present

## 2015-11-14 NOTE — Progress Notes (Signed)
Subjective:    Patient ID: Ashley Armstrong, female    DOB: 1966/10/13, 50 y.o.   MRN: [id number]  HPI   Ashley Armstrong     MRN: [id number]      DOB: January 15, 1966   HPI Ashley Armstrong is here for follow up and re-evaluation of chronic medical conditions, medication management and review of any available recent lab and radiology data.  Preventive health is updated, specifically  Cancer screening and Immunization.   Questions or concerns regarding consultations or procedures which the PT has had in the interim are  addressed. The PT denies any adverse reactions to current medications since the last visit.  Ongoing left upper extremity pain and weakness, still trying to get disability, plans to see ortho again in the near future Has blood sugar log, sometimes sugars flare depending on diet  ROS Denies recent fever or chills. Denies sinus pressure, nasal congestion, ear pain or sore throat. Denies chest congestion, productive cough or wheezing. Denies chest pains, palpitations and leg swelling Denies abdominal pain, nausea, vomiting,diarrhea or constipation.   Denies dysuria, frequency, hesitancy or incontinence. C/o chronic left upper extremity pain prevnting use of hand. Denies headaches, seizures, numbness, or tingling. Denies uncontrolled depression, anxiety does c/o  insomnia. Denies skin break down or rash.   PE  BP 120/80 mmHg  Pulse 90  Resp 16  Ht 5\' 8"  (1.727 m)  Wt 121 lb 6.4 oz (55.067 kg)  BMI 18.46 kg/m2  SpO2 99%  LMP 07/19/2013  Patient alert and oriented and in no cardiopulmonary distress.  HEENT: No facial asymmetry, EOMI,   oropharynx pink and moist.  Neck supple no JVD, no mass.  Chest: Clear to auscultation bilaterally.  CVS: S1, S2 no murmurs, no S3.Regular rate.  ABD: Soft non tender.   Ext: No edema  MS: Adequate ROM spine, shoulders, hips and knees.  Skin: Intact, no ulcerations or rash noted.  Psych: Good eye contact, normal affect. Memory intact  not anxious or depressed appearing.  CNS: CN 2-12 intact, power,  normal throughout.no focal deficits noted.   Assessment & Plan   DIABETES MELLITUS, WITH RENAL COMPLICATIONS Deteriorated,dose increase on insulin to 45 units, commit to daily exercise and continue current sliding scale Ashley Armstrong is reminded of the importance of commitment to daily physical activity for 30 minutes or more, as able and the need to limit carbohydrate intake to 30 to 60 grams per meal to help with blood sugar control.   The need to take medication as prescribed, test blood sugar as directed, and to call between visits if there is a concern that blood sugar is uncontrolled is also discussed.   Ashley Armstrong is reminded of the importance of daily foot exam, annual eye examination, and good blood sugar, blood pressure and cholesterol control.  Diabetic Labs Latest Ref Rng 11/09/2015 08/07/2015 05/04/2015 02/02/2015 07/04/2014  HbA1c <5.7 % 8.3(H) 7.9(H) 10.0(H) 10.0(A) -  Microalbumin <2.0 mg/dL - 17.9(H) - - -  Micro/Creat Ratio 0.0 - 30.0 mg/g - 188.4(H) - - -  Chol 0 - 200 mg/dL - - - - -  HDL >39 mg/dL - - - - -  Calc LDL 0 - 99 mg/dL - - - - -  Triglycerides <150 mg/dL - - - - -  Creatinine 0.50 - 1.10 mg/dL 1.00 1.20(H) - - 1.14(H)   BP/Weight 11/14/2015 10/12/2015 08/29/2015 08/10/2015 07/27/2015 123XX123 123XX123  Systolic BP 123456 123456 123XX123 Q000111Q - 123456 Q000111Q  Diastolic BP 80 71  74 85 - 69 85  Wt. (Lbs) 121.4 120.6 120 - 119 - 117.8  BMI 18.46 18.34 18.25 - 18.1 - 17.92   Foot/eye exam completion dates Latest Ref Rng 06/08/2015 07/08/2014  Eye Exam No Retinopathy - Retinopathy(A)  Foot Form Completion - Done -   F/u in 3 month      Left arm pain Ongoing pain symptom , limiting/ preventing her ability to work, will return to ortho for re evaluation in the near future, still has not been awareded disability since applying in 2014  Diabetes mellitus, insulin dependent (IDDM), uncontrolled Deteriorated Lantiu  dose increased, and importance of compliance is stressed, also aready has retinopathy, neuropathy and nephropathy due to uncontrolled IDDM Ashley Armstrong is reminded of the importance of commitment to daily physical activity for 30 minutes or more, as able and the need to limit carbohydrate intake to 30 to 60 grams per meal to help with blood sugar control.   The need to take medication as prescribed, test blood sugar as directed, and to call between visits if there is a concern that blood sugar is uncontrolled is also discussed.   Ashley Armstrong is reminded of the importance of daily foot exam, annual eye examination, and good blood sugar, blood pressure and cholesterol control.  Diabetic Labs Latest Ref Rng 11/09/2015 08/07/2015 05/04/2015 02/02/2015 07/04/2014  HbA1c <5.7 % 8.3(H) 7.9(H) 10.0(H) 10.0(A) -  Microalbumin <2.0 mg/dL - 17.9(H) - - -  Micro/Creat Ratio 0.0 - 30.0 mg/g - 188.4(H) - - -  Chol 0 - 200 mg/dL - - - - -  HDL >39 mg/dL - - - - -  Calc LDL 0 - 99 mg/dL - - - - -  Triglycerides <150 mg/dL - - - - -  Creatinine 0.50 - 1.10 mg/dL 1.00 1.20(H) - - 1.14(H)   BP/Weight 11/14/2015 10/12/2015 08/29/2015 08/10/2015 07/27/2015 123XX123 123XX123  Systolic BP 123456 123456 123XX123 Q000111Q - 123456 Q000111Q  Diastolic BP 80 71 74 85 - 69 85  Wt. (Lbs) 121.4 120.6 120 - 119 - 117.8  BMI 18.46 18.34 18.25 - 18.1 - 17.92   Foot/eye exam completion dates Latest Ref Rng 06/08/2015 07/08/2014  Eye Exam No Retinopathy - Retinopathy(A)  Foot Form Completion - Done -         Depression with anxiety Stable, managed by psych, not suicidal or homicidal, stressed as still has not qualified for disability, hence no income  Gastroparesis Controlled , followed by GI  Hyperlipidemia LDL goal <100 Controlled, no change in medication Hyperlipidemia:Low fat diet discussed and encouraged.   Lipid Panel  Lab Results  Component Value Date   CHOL 163 06/01/2014   HDL 62 06/01/2014   LDLCALC 80 06/01/2014   TRIG 106  06/01/2014   CHOLHDL 2.6 06/01/2014             Review of Systems     Objective:   Physical Exam        Assessment & Plan:

## 2015-11-14 NOTE — Assessment & Plan Note (Signed)
Ongoing pain symptom , limiting/ preventing her ability to work, will return to ortho for re evaluation in the near future, still has not been awareded disability since applying in 2014

## 2015-11-14 NOTE — Assessment & Plan Note (Signed)
Deteriorated,dose increase on insulin to 45 units, commit to daily exercise and continue current sliding scale Ashley Armstrong is reminded of the importance of commitment to daily physical activity for 30 minutes or more, as able and the need to limit carbohydrate intake to 30 to 60 grams per meal to help with blood sugar control.   The need to take medication as prescribed, test blood sugar as directed, and to call between visits if there is a concern that blood sugar is uncontrolled is also discussed.   Ashley Armstrong is reminded of the importance of daily foot exam, annual eye examination, and good blood sugar, blood pressure and cholesterol control.  Diabetic Labs Latest Ref Rng 11/09/2015 08/07/2015 05/04/2015 02/02/2015 07/04/2014  HbA1c <5.7 % 8.3(H) 7.9(H) 10.0(H) 10.0(A) -  Microalbumin <2.0 mg/dL - 17.9(H) - - -  Micro/Creat Ratio 0.0 - 30.0 mg/g - 188.4(H) - - -  Chol 0 - 200 mg/dL - - - - -  HDL >39 mg/dL - - - - -  Calc LDL 0 - 99 mg/dL - - - - -  Triglycerides <150 mg/dL - - - - -  Creatinine 0.50 - 1.10 mg/dL 1.00 1.20(H) - - 1.14(H)   BP/Weight 11/14/2015 10/12/2015 08/29/2015 08/10/2015 07/27/2015 123XX123 123XX123  Systolic BP 123456 123456 123XX123 Q000111Q - 123456 Q000111Q  Diastolic BP 80 71 74 85 - 69 85  Wt. (Lbs) 121.4 120.6 120 - 119 - 117.8  BMI 18.46 18.34 18.25 - 18.1 - 17.92   Foot/eye exam completion dates Latest Ref Rng 06/08/2015 07/08/2014  Eye Exam No Retinopathy - Retinopathy(A)  Foot Form Completion - Done -   F/u in 3 month

## 2015-11-14 NOTE — Patient Instructions (Addendum)
F/u in 3 month, call if you need me sooner  You NEED to WORK consistently on getting blood sugar improved.  Goal range before breakfast, is between 80 to 130, and before lunch and dinner, between 90 to 150, bedtime range is between 150 to 180  Increase lantus to 45 units daily, and after 2 weeks if blood sugar remains too high, increase to 50 units  Continue sliding scale as before   Discuss poor sleep with your psychiatrist  All th best for 2017  HBA1C, chem 7 and EGFR in 3 month  Thanks for choosing [facility], we consider it a privelige to serve you.

## 2015-11-26 NOTE — Assessment & Plan Note (Signed)
Controlled, no change in medication Hyperlipidemia:Low fat diet discussed and encouraged.   Lipid Panel  Lab Results  Component Value Date   CHOL 163 06/01/2014   HDL 62 06/01/2014   LDLCALC 80 06/01/2014   TRIG 106 06/01/2014   CHOLHDL 2.6 06/01/2014       

## 2015-11-26 NOTE — Assessment & Plan Note (Signed)
Stable, managed by psych, not suicidal or homicidal, stressed as still has not qualified for disability, hence no income

## 2015-11-26 NOTE — Assessment & Plan Note (Signed)
Deteriorated Lantiu dose increased, and importance of compliance is stressed, also aready has retinopathy, neuropathy and nephropathy due to uncontrolled IDDM Ashley Armstrong is reminded of the importance of commitment to daily physical activity for 30 minutes or more, as able and the need to limit carbohydrate intake to 30 to 60 grams per meal to help with blood sugar control.   The need to take medication as prescribed, test blood sugar as directed, and to call between visits if there is a concern that blood sugar is uncontrolled is also discussed.   Ashley Armstrong is reminded of the importance of daily foot exam, annual eye examination, and good blood sugar, blood pressure and cholesterol control.  Diabetic Labs Latest Ref Rng 11/09/2015 08/07/2015 05/04/2015 02/02/2015 07/04/2014  HbA1c <5.7 % 8.3(H) 7.9(H) 10.0(H) 10.0(A) -  Microalbumin <2.0 mg/dL - 17.9(H) - - -  Micro/Creat Ratio 0.0 - 30.0 mg/g - 188.4(H) - - -  Chol 0 - 200 mg/dL - - - - -  HDL >39 mg/dL - - - - -  Calc LDL 0 - 99 mg/dL - - - - -  Triglycerides <150 mg/dL - - - - -  Creatinine 0.50 - 1.10 mg/dL 1.00 1.20(H) - - 1.14(H)   BP/Weight 11/14/2015 10/12/2015 08/29/2015 08/10/2015 07/27/2015 123XX123 123XX123  Systolic BP 123456 123456 123XX123 Q000111Q - 123456 Q000111Q  Diastolic BP 80 71 74 85 - 69 85  Wt. (Lbs) 121.4 120.6 120 - 119 - 117.8  BMI 18.46 18.34 18.25 - 18.1 - 17.92   Foot/eye exam completion dates Latest Ref Rng 06/08/2015 07/08/2014  Eye Exam No Retinopathy - Retinopathy(A)  Foot Form Completion - Done -

## 2015-11-26 NOTE — Assessment & Plan Note (Signed)
Controlled, followed by GI 

## 2015-11-27 ENCOUNTER — Encounter: Payer: Self-pay | Admitting: Sports Medicine

## 2015-11-27 ENCOUNTER — Ambulatory Visit (INDEPENDENT_AMBULATORY_CARE_PROVIDER_SITE_OTHER): Payer: Medicaid Other | Admitting: Sports Medicine

## 2015-11-27 DIAGNOSIS — M722 Plantar fascial fibromatosis: Secondary | ICD-10-CM

## 2015-11-27 DIAGNOSIS — E1142 Type 2 diabetes mellitus with diabetic polyneuropathy: Secondary | ICD-10-CM | POA: Diagnosis not present

## 2015-11-27 DIAGNOSIS — M79671 Pain in right foot: Secondary | ICD-10-CM

## 2015-11-27 DIAGNOSIS — B351 Tinea unguium: Secondary | ICD-10-CM | POA: Diagnosis not present

## 2015-11-27 DIAGNOSIS — M79672 Pain in left foot: Secondary | ICD-10-CM

## 2015-11-27 DIAGNOSIS — M79676 Pain in unspecified toe(s): Secondary | ICD-10-CM | POA: Diagnosis not present

## 2015-11-27 MED ORDER — TRIAMCINOLONE ACETONIDE 10 MG/ML IJ SUSP: 10.0000 mg | Freq: Once | INTRAMUSCULAR | Status: DC

## 2015-11-27 NOTE — Patient Instructions (Signed)

## 2015-11-27 NOTE — Progress Notes (Signed)
Patient ID: Ashley Armstrong, female   DOB: September 30, 1966, 50 y.o.   MRN: [id number] Subjective: Ashley Armstrong is a 50 y.o. female patient presents to office for routine diabetic foot care and with complaint of heel pain bilateral; patient states that she had an injection to the heel last visit which helped a lot. Patient admits to post static dyskinesia on and off for a few months that is complicated by the neuropathic pain that she feels in her feet. Patient has diabetic shoes but can not tell if they are helping. Patient denies any other pedal concerns at this time.  FBS 154mg /dl  Patient Active Problem List   Diagnosis Date Noted  . Change in bowel habits 10/12/2015  . Seasonal allergies 11/09/2014  . Depression with anxiety 06/18/2014  . Tight fascia 01/17/2014  . Neck pain 12/07/2013  . Degenerative disc disease, cervical 12/07/2013  . Thoracic back pain 07/31/2013  . CTS (carpal tunnel syndrome) 07/08/2013  . Polyneuropathy ([place]) 07/08/2013  . Diabetic nephropathy ([place]) 06/23/2013  . TIA (transient ischemic attack) 04/28/2013  . Tennis elbow syndrome 04/15/2013  . Rotator cuff syndrome of left shoulder 04/15/2013  . Vitamin D deficiency 03/20/2013  . Onychomycosis of toenail 03/20/2013  . Left arm pain 03/19/2013  . HEADACHE 11/30/2008  . DIABETES MELLITUS, WITH RENAL COMPLICATIONS 123XX123  . Chronic fatigue 10/24/2008  . Gastroparesis 06/16/2008  . Diabetes mellitus, insulin dependent (IDDM), uncontrolled ([place]) 04/13/2008  . Hyperlipidemia LDL goal <100 04/13/2008  . Generalized anxiety disorder 04/13/2008   Current Outpatient Prescriptions on File Prior to Visit  Medication Sig Dispense Refill  . clotrimazole (LOTRIMIN) 1 % cream Apply 1 application topically 2 (two) times daily. 30 g 0  . diazepam (VALIUM) 10 MG tablet Take 10 mg by mouth daily as needed for anxiety.     . diphenoxylate-atropine (LOMOTIL) 2.5-0.025 MG tablet Take 1 tablet by mouth 4 (four) times daily as  needed for diarrhea or loose stools. 12 tablet 0  . DULoxetine (CYMBALTA) 60 MG capsule Take 1 capsule (60 mg total) by mouth daily. 30 capsule 3  . glucose blood (ACCU-CHEK AVIVA) test strip Use as instructed four times daily testing E11.65 200 each 5  . Insulin Glargine (LANTUS SOLOSTAR) 100 UNIT/ML Solostar Pen INJECT 50 UNITS INTO THE SKIN DAILY AT 10 PM (Patient taking differently: 40 Units. INJECT 50 UNITS INTO THE SKIN DAILY AT 10 PM) 15 mL 3  . lisinopril (PRINIVIL,ZESTRIL) 2.5 MG tablet TAKE ONE (1) TABLET BY MOUTH EVERY DAY 90 tablet 3  . mometasone (NASONEX) 50 MCG/ACT nasal spray Place 2 sprays into the nose daily. (Patient taking differently: Place 2 sprays into the nose daily as needed (Allergies). ) 17 g 12  . montelukast (SINGULAIR) 10 MG tablet TAKE ONE TABLET BY MOUTH EVERY NIGHT AT BEDTIME 30 tablet 3  . NOVOLOG FLEXPEN 100 UNIT/ML FlexPen TAKE 10 TO 20 UNITS 3 TIMES A DAY WITH MEALS 15 mL 5  . omeprazole (PRILOSEC) 40 MG capsule 1 po 30 mins prior to breakfast and supper 180 capsule 3  . ondansetron (ZOFRAN) 4 MG tablet Take 1 tablet (4 mg total) by mouth every 8 (eight) hours as needed for nausea or vomiting. 60 tablet 5  . ondansetron (ZOFRAN) 4 MG tablet Take 1 tablet (4 mg total) by mouth 2 (two) times daily as needed for nausea or vomiting. 12 tablet 0  . pravastatin (PRAVACHOL) 80 MG tablet TAKE ONE TABLET BY MOUTH IN THE EVENING. 30 tablet 3  .  traMADol-acetaminophen (ULTRACET) 37.5-325 MG per tablet Take 1 tablet by mouth every 4 (four) hours as needed. 90 tablet 5   No current facility-administered medications on file prior to visit.   No Known Allergies  Objective: Physical Exam General: The patient is alert and oriented x3 in no acute distress.  Dermatology: Skin is warm, dry and supple bilateral lower extremities. Nails 1-10 are elongated, thickened with subungal debris resembling onychomycosis. There is no erythema, edema, no eccymosis, no open lesions present.  Integument is otherwise unremarkable.  Vascular: Dorsalis Pedis pulse and Posterior Tibial pulse are 1/4 bilateral. Capillary fill time is <3 secs to all digits.  Neurological: Protective sensation absent bilateral. Vibratory absent bilateral.   Musculoskeletal: Tenderness to palpation at the medial calcaneal tubercale and through the insertion of the plantar fascia bilateral. No pain with compression of calcaneus bilateral. No pain with tuning fork to calcaneus bilateral. No pain with calf compression bilateral. No tenderness to toes on left foot at previous fracture sites. Mild varus rotated 4-5 hammertoes bilateal. Strength 4/5 in all groups bilateral.   Assessment and Plan: Problem List Items Addressed This Visit    None    Visit Diagnoses    Plantar fasciitis, bilateral    -  Primary    Relevant Medications    triamcinolone acetonide (KENALOG) 10 MG/ML injection 10 mg    Dermatophytosis of nail        Diabetic polyneuropathy associated with type 2 diabetes mellitus (HCC)        Foot pain, bilateral          -Patient seen and evaluated -Previous x-rays reviewed -Complete examination performed. Discussed with patient in detail the condition of plantar fasciitis, how this occurs and general treatment options. Explained both conservative and surgical treatments.  -After oral consent and aseptic prep, injected a mixture containing 1 ml of 2%  plain lidocaine, 1 ml 0.5% plain marcaine, 0.5 ml of kenalog 10 and 0.5 ml of dexamethasone phosphate into both heels. Post-injection care discussed with patient.  -Recommended cont with diabetic shoes daily  -Explained and dispensed to patient daily stretching exercises. -Mechanically debrided nails x 10 using sterile nail nipper without incident. Advised daily foot inspection and educated patient on the importance of glycemic control. -Patient to return to office in 12 weeks for follow up or sooner if problems or questions arise.  Landis Martins, DPM

## 2015-11-28 ENCOUNTER — Other Ambulatory Visit: Payer: Self-pay | Admitting: Family Medicine

## 2015-11-28 ENCOUNTER — Telehealth: Payer: Self-pay | Admitting: Family Medicine

## 2015-11-28 NOTE — Telephone Encounter (Signed)
Had 2 cortisone shots in her feet and her sugars has been running up into the 500's Currently on 45 units on lantus and ss novolog. Has been keeping a check every few hours, will call in the am with her bedtime and fasting readings to see if she needs her insulin adjusted. Aware if between now and then her sugar goes up and she is unable to get it down with her novolog to go to the ER. Patient aware

## 2015-11-28 NOTE — Telephone Encounter (Signed)
Noted pls send the update in am

## 2015-11-28 NOTE — Telephone Encounter (Signed)
Patient states that she had cortisone shots in both feet yesterday and her blood sugar is running real high like in the 500's please advise?

## 2015-11-29 NOTE — Telephone Encounter (Signed)
If on lantus 40 units inc to 45 , if on 45 increase to 50 pls let her know

## 2015-11-29 NOTE — Telephone Encounter (Signed)
Called patient and left message for them to return call at the office   

## 2015-11-29 NOTE — Telephone Encounter (Signed)
245 bedtime lastnight---took (2 units of novolog)  192 fasting this am---Will check it 2 hours after lunch and give me an update as well.

## 2015-11-29 NOTE — Telephone Encounter (Signed)
Pt aware.

## 2015-12-25 ENCOUNTER — Encounter: Payer: Self-pay | Admitting: Gastroenterology

## 2016-01-22 ENCOUNTER — Other Ambulatory Visit: Payer: Self-pay

## 2016-01-22 MED ORDER — GLUCOSE BLOOD VI STRP: ORAL_STRIP | Status: DC

## 2016-01-26 ENCOUNTER — Other Ambulatory Visit: Payer: Self-pay

## 2016-01-26 MED ORDER — GLUCOSE BLOOD VI STRP: ORAL_STRIP | Status: DC

## 2016-02-06 LAB — BASIC METABOLIC PANEL WITH GFR
BUN: 19 mg/dL (ref 7–25)
CALCIUM: 9.3 mg/dL (ref 8.6–10.2)
CO2: 27 mmol/L (ref 20–31)
CREATININE: 1.17 mg/dL — AB (ref 0.50–1.10)
Chloride: 105 mmol/L (ref 98–110)
GFR, EST AFRICAN AMERICAN: 64 mL/min (ref >=60)
GFR, EST NON AFRICAN AMERICAN: 55 mL/min — AB (ref >=60)
Glucose, Bld: 155 mg/dL — ABNORMAL HIGH (ref 65–99)
Potassium: 4.2 mmol/L (ref 3.5–5.3)
SODIUM: 140 mmol/L (ref 135–146)

## 2016-02-07 LAB — HEMOGLOBIN A1C
HEMOGLOBIN A1C: 8.1 % — AB (ref <5.7)
MEAN PLASMA GLUCOSE: 186 mg/dL

## 2016-02-12 ENCOUNTER — Encounter: Payer: Self-pay | Admitting: Family Medicine

## 2016-02-12 ENCOUNTER — Ambulatory Visit (INDEPENDENT_AMBULATORY_CARE_PROVIDER_SITE_OTHER): Payer: Medicaid Other | Admitting: Family Medicine

## 2016-02-12 VITALS — BP 108/70 | HR 81 | Resp 16 | Ht 68.0 in | Wt 123.1 lb

## 2016-02-12 DIAGNOSIS — J302 Other seasonal allergic rhinitis: Secondary | ICD-10-CM

## 2016-02-12 DIAGNOSIS — E559 Vitamin D deficiency, unspecified: Secondary | ICD-10-CM

## 2016-02-12 DIAGNOSIS — E785 Hyperlipidemia, unspecified: Secondary | ICD-10-CM | POA: Diagnosis not present

## 2016-02-12 DIAGNOSIS — F418 Other specified anxiety disorders: Secondary | ICD-10-CM | POA: Diagnosis not present

## 2016-02-12 DIAGNOSIS — M79602 Pain in left arm: Secondary | ICD-10-CM

## 2016-02-12 DIAGNOSIS — E1065 Type 1 diabetes mellitus with hyperglycemia: Secondary | ICD-10-CM | POA: Diagnosis not present

## 2016-02-12 DIAGNOSIS — E103299 Type 1 diabetes mellitus with mild nonproliferative diabetic retinopathy without macular edema, unspecified eye: Secondary | ICD-10-CM

## 2016-02-12 DIAGNOSIS — E1165 Type 2 diabetes mellitus with hyperglycemia: Secondary | ICD-10-CM

## 2016-02-12 MED ORDER — INSULIN GLARGINE 100 UNIT/ML SOLOSTAR PEN: PEN_INJECTOR | SUBCUTANEOUS | Status: DC

## 2016-02-12 MED ORDER — DULOXETINE HCL 60 MG PO CPEP: 60.0000 mg | ORAL_CAPSULE | Freq: Every day | ORAL | Status: DC

## 2016-02-12 MED ORDER — INSULIN ASPART 100 UNIT/ML FLEXPEN: PEN_INJECTOR | SUBCUTANEOUS | Status: DC

## 2016-02-12 NOTE — Progress Notes (Signed)
Subjective:    Patient ID: Ashley Armstrong, female    DOB: [birth year], 50 y.o.   MRN: [id number]  HPI   Ashley Armstrong     MRN: [id number]      DOB: 01/23/1966   HPI Ashley Armstrong is here for follow up and re-evaluation of chronic medical conditions, medication management and review of any available recent lab and radiology data.  Preventive health is updated, specifically  Cancer screening and Immunization.    The PT denies any adverse reactions to current medications since the last visit.  There are no new concerns.  There are no specific complaints   ROS Denies recent fever or chills. Denies sinus pressure, nasal congestion, ear pain or sore throat. Denies chest congestion, productive cough or wheezing. Denies chest pains, palpitations and leg swelling Denies abdominal pain, nausea, vomiting,diarrhea or constipation.   Denies dysuria, frequency, hesitancy or incontinence. Chronic  joint pain,  unchangedity. Denies headaches, seizures, numbness, or tingling. C/o depression, anxiety and  Insomnia.Worried about getting disability, which she is still waiting on.  Denies skin break down or rash.   PE  BP 108/70 mmHg  Pulse 81  Resp 16  Ht 5\' 8"  (1.727 m)  Wt 123 lb 1.9 oz (55.847 kg)  BMI 18.72 kg/m2  SpO2 96%  LMP 07/19/2013  Patient alert and oriented and in no cardiopulmonary distress.  HEENT: No facial asymmetry, EOMI,   oropharynx pink and moist.  Neck supple no JVD, no mass. Erythema and edema of nasal mucosa, TM clear  Chest: Clear to auscultation bilaterally.  CVS: S1, S2 no murmurs, no S3.Regular rate.  ABD: Soft non tender.   Ext: No edema  MS: Adequate ROM spine, shoulders, hips and knees.  Skin: Intact, no ulcerations or rash noted.  Psych: Good eye contact, normal affect. Memory intact mildly  anxious not  depressed appearing.  CNS: CN 2-12 intact, power,  normal throughout.no focal deficits noted.   Assessment & Plan  Diabetes mellitus, insulin  dependent (IDDM), uncontrolled Improved, increase lantus dose [name] is reminded of the importance of commitment to daily physical activity for 30 minutes or more, as able and the need to limit carbohydrate intake to 30 to 60 grams per meal to help with blood sugar control.   The need to take medication as prescribed, test blood sugar as directed, and to call between visits if there is a concern that blood sugar is uncontrolled is also discussed.   [name] is reminded of the importance of daily foot exam, annual eye examination, and good blood sugar, blood pressure and cholesterol control.  Diabetic Labs Latest Ref Rng 02/06/2016 11/09/2015 08/07/2015 05/04/2015 02/02/2015  HbA1c <5.7 % 8.1(H) 8.3(H) 7.9(H) 10.0(H) 10.0(A)  Microalbumin <2.0 mg/dL - - 17.9(H) - -  Micro/Creat Ratio 0.0 - 30.0 mg/g - - 188.4(H) - -  Chol 0 - 200 mg/dL - - - - -  HDL >39 mg/dL - - - - -  Calc LDL 0 - 99 mg/dL - - - - -  Triglycerides <150 mg/dL - - - - -  Creatinine 0.50 - 1.10 mg/dL 1.17(H) 1.00 1.20(H) - -   BP/Weight 02/12/2016 11/14/2015 10/12/2015 08/29/2015 08/10/2015 [id number]  Systolic BP 123XX123 123456 123456 123XX123 Q000111Q - 123456  Diastolic BP 70 80 71 74 85 - 69  Wt. (Lbs) 123.12 121.4 120.6 120 - 119 -  BMI 18.72 18.46 18.34 18.25 - 18.1 -   Foot/eye exam completion dates Latest Ref Rng 06/08/2015  07/08/2014  Eye Exam No Retinopathy - Retinopathy(A)  Foot Form Completion - Done -         Hyperlipidemia LDL goal <100 Hyperlipidemia:Low fat diet discussed and encouraged.   Lipid Panel  Lab Results  Component Value Date   CHOL 163 06/01/2014   HDL 62 06/01/2014   LDLCALC 80 06/01/2014   TRIG 106 06/01/2014   CHOLHDL 2.6 06/01/2014   Updated lab needed at/ before next visit.       Depression with anxiety Improved, however remains stressed as still awaiting disability. Continue current meds and therapy  Seasonal allergies Increased symptoms with recent season change, pt to take  prescribed meds daily       Review of Systems     Objective:   Physical Exam        Assessment & Plan:

## 2016-02-12 NOTE — Patient Instructions (Addendum)
F/u in 3 month, call if you need me before  Congrats on improved overall health, hopefully disability will come through  Fasting lipid, cmp and EGFr,hBA1C and vit D in 3 month  Increase lantus to 60 units daily and you are referred for eye exam  Cymvbalta is sent  STOP ultracet  Thanks for choosing [facility], we consider it a privelige to serve you.

## 2016-02-14 ENCOUNTER — Ambulatory Visit (HOSPITAL_COMMUNITY)
Admission: RE | Admit: 2016-02-14 | Discharge: 2016-02-14 | Disposition: A | Discharge status: Home / Self Care | Payer: Medicaid Other | Source: Ambulatory Visit | Attending: Family Medicine | Admitting: Family Medicine

## 2016-02-14 DIAGNOSIS — Z1231 Encounter for screening mammogram for malignant neoplasm of breast: Secondary | ICD-10-CM | POA: Diagnosis present

## 2016-02-15 ENCOUNTER — Telehealth: Payer: Self-pay | Admitting: Family Medicine

## 2016-02-15 MED ORDER — CEPHALEXIN 500 MG PO CAPS
500.0000 mg | ORAL_CAPSULE | Freq: Two times a day (BID) | ORAL | Status: DC
Start: 2016-02-15 — End: 2016-05-23

## 2016-02-15 NOTE — Telephone Encounter (Signed)
Pt has abcessed tooth but can't find a dentist that takes her insurance. Wants something called in for it.

## 2016-02-15 NOTE — Telephone Encounter (Signed)
Patient is asking if Dr. Moshe Cipro would call her in an Antibiotic to [facility] for an Accessed tooth until she can find a dentist who takes her insurance, please advise?

## 2016-02-15 NOTE — Telephone Encounter (Signed)
Keflex sent pls let her know

## 2016-02-15 NOTE — Telephone Encounter (Signed)
Pt aware med sent in

## 2016-02-18 NOTE — Assessment & Plan Note (Signed)
Hyperlipidemia:Low fat diet discussed and encouraged.   Lipid Panel  Lab Results  Component Value Date   CHOL 163 06/01/2014   HDL 62 06/01/2014   LDLCALC 80 06/01/2014   TRIG 106 06/01/2014   CHOLHDL 2.6 06/01/2014   Updated lab needed at/ before next visit.

## 2016-02-18 NOTE — Assessment & Plan Note (Signed)
Increased symptoms with recent season change, pt to take prescribed meds daily

## 2016-02-18 NOTE — Assessment & Plan Note (Signed)
Improved, however remains stressed as still awaiting disability. Continue current meds and therapy

## 2016-02-18 NOTE — Assessment & Plan Note (Signed)
Improved, increase lantus dose Ms. Quadri is reminded of the importance of commitment to daily physical activity for 30 minutes or more, as able and the need to limit carbohydrate intake to 30 to 60 grams per meal to help with blood sugar control.   The need to take medication as prescribed, test blood sugar as directed, and to call between visits if there is a concern that blood sugar is uncontrolled is also discussed.   Ms. Kelemen is reminded of the importance of daily foot exam, annual eye examination, and good blood sugar, blood pressure and cholesterol control.  Diabetic Labs Latest Ref Rng 02/06/2016 11/09/2015 08/07/2015 05/04/2015 02/02/2015  HbA1c <5.7 % 8.1(H) 8.3(H) 7.9(H) 10.0(H) 10.0(A)  Microalbumin <2.0 mg/dL - - 17.9(H) - -  Micro/Creat Ratio 0.0 - 30.0 mg/g - - 188.4(H) - -  Chol 0 - 200 mg/dL - - - - -  HDL >39 mg/dL - - - - -  Calc LDL 0 - 99 mg/dL - - - - -  Triglycerides <150 mg/dL - - - - -  Creatinine 0.50 - 1.10 mg/dL 1.17(H) 1.00 1.20(H) - -   BP/Weight 02/12/2016 11/14/2015 10/12/2015 08/29/2015 08/10/2015 [id number]  Systolic BP 123XX123 123456 123456 123XX123 Q000111Q - 123456  Diastolic BP 70 80 71 74 85 - 69  Wt. (Lbs) 123.12 121.4 120.6 120 - 119 -  BMI 18.72 18.46 18.34 18.25 - 18.1 -   Foot/eye exam completion dates Latest Ref Rng 06/08/2015 07/08/2014  Eye Exam No Retinopathy - Retinopathy(A)  Foot Form Completion - Done -

## 2016-02-22 ENCOUNTER — Encounter (INDEPENDENT_AMBULATORY_CARE_PROVIDER_SITE_OTHER): Payer: Medicaid Other | Admitting: Gastroenterology

## 2016-02-22 ENCOUNTER — Telehealth: Payer: Self-pay | Admitting: Gastroenterology

## 2016-02-22 ENCOUNTER — Encounter: Payer: Self-pay | Admitting: Gastroenterology

## 2016-02-22 NOTE — Progress Notes (Signed)
   Subjective:    Patient ID: Ashley Armstrong, female    DOB: 10-07-1966, 50 y.o.   MRN: [id number]  HPI  NO SHOW/NO CALL.  Past Medical History  Diagnosis Date  . Nicotine addiction   . Anxiety   . IDDM (insulin dependent diabetes mellitus) ([place])   . Dyslipidemia   . Headache(784.0)   . TIA (transient ischemic attack)     Dr. Merlene Laughter  . Gastroparesis due to DM ([place]) OCT 2014    75% AT 2 HRS, GLU >   . Neuropathy ([place])    Past Surgical History  Procedure Laterality Date  . Laser surgery bilateral eye    . Esophagogastroduodenoscopy N/A 08/31/2013    SLF: 1. E[place]y satiety nausea may be due to Gastroparesis/pyloric channel stenosis. 2. small hiatal hernia 3. Moderate erosive gastritis.       Review of Systems     Objective:   Physical Exam        Assessment & Plan:

## 2016-02-22 NOTE — Telephone Encounter (Signed)
REVIEWED-NO ADDITIONAL RECOMMENDATIONS. 

## 2016-02-22 NOTE — Telephone Encounter (Signed)
PATIENT WAS A NO SHOW AND LETTER WAS SENT  °

## 2016-02-26 ENCOUNTER — Ambulatory Visit: Payer: Medicaid Other | Admitting: Sports Medicine

## 2016-03-11 ENCOUNTER — Ambulatory Visit: Payer: Medicaid Other | Admitting: Sports Medicine

## 2016-03-21 ENCOUNTER — Encounter: Payer: Self-pay | Admitting: Gastroenterology

## 2016-04-02 ENCOUNTER — Ambulatory Visit: Payer: Medicaid Other | Admitting: Sports Medicine

## 2016-04-15 ENCOUNTER — Other Ambulatory Visit: Payer: Self-pay | Admitting: Family Medicine

## 2016-04-25 ENCOUNTER — Telehealth: Payer: Self-pay | Admitting: Family Medicine

## 2016-04-25 NOTE — Telephone Encounter (Signed)
Patient is asking for a refill on her Lancets to be sent to [facility]

## 2016-04-26 NOTE — Telephone Encounter (Signed)
Lancets refilled 

## 2016-04-29 ENCOUNTER — Ambulatory Visit (INDEPENDENT_AMBULATORY_CARE_PROVIDER_SITE_OTHER): Payer: Medicaid Other | Admitting: Sports Medicine

## 2016-04-29 ENCOUNTER — Encounter: Payer: Self-pay | Admitting: Sports Medicine

## 2016-04-29 DIAGNOSIS — M79671 Pain in right foot: Secondary | ICD-10-CM

## 2016-04-29 DIAGNOSIS — M79672 Pain in left foot: Secondary | ICD-10-CM

## 2016-04-29 DIAGNOSIS — E1142 Type 2 diabetes mellitus with diabetic polyneuropathy: Secondary | ICD-10-CM | POA: Diagnosis not present

## 2016-04-29 DIAGNOSIS — B351 Tinea unguium: Secondary | ICD-10-CM | POA: Diagnosis not present

## 2016-04-29 NOTE — Progress Notes (Signed)
Patient ID: Ashley Armstrong, female   DOB: 16-Mar-1966, 50 y.o.   MRN: [id number] Subjective: Ashley Armstrong is a 50 y.o. female patient with history of diabetes who presents to office today complaining of long, painful nails  while ambulating in shoes; unable to trim. Patient states that the glucose reading this morning was not recorded, a1c 8.1. Patient denies any new changes in medication or new problems. States that her heels feel better; especially after stretching. Patient denies any new cramping, numbness, burning or tingling in the legs.  Patient Active Problem List   Diagnosis Date Noted  . Change in bowel habits 10/12/2015  . Seasonal allergies 11/09/2014  . Depression with anxiety 06/18/2014  . Tight fascia 01/17/2014  . Neck pain 12/07/2013  . Degenerative disc disease, cervical 12/07/2013  . Thoracic back pain 07/31/2013  . CTS (carpal tunnel syndrome) 07/08/2013  . Polyneuropathy ([place]) 07/08/2013  . Diabetic nephropathy ([place]) 06/23/2013  . TIA (transient ischemic attack) 04/28/2013  . Tennis elbow syndrome 04/15/2013  . Rotator cuff syndrome of left shoulder 04/15/2013  . Vitamin D deficiency 03/20/2013  . Onychomycosis of toenail 03/20/2013  . Left arm pain 03/19/2013  . HEADACHE 11/30/2008  . DIABETES MELLITUS, WITH RENAL COMPLICATIONS 65/01/5464  . Chronic fatigue 10/24/2008  . Gastroparesis 06/16/2008  . Diabetes mellitus, insulin dependent (IDDM), uncontrolled ([place]) 04/13/2008  . Hyperlipidemia LDL goal <100 04/13/2008  . Generalized anxiety disorder 04/13/2008   Current Outpatient Prescriptions on File Prior to Visit  Medication Sig Dispense Refill  . cephALEXin (KEFLEX) 500 MG capsule Take 1 capsule (500 mg total) by mouth 2 (two) times daily. 20 capsule 0  . clotrimazole (LOTRIMIN) 1 % cream Apply 1 application topically 2 (two) times daily. 30 g 0  . diazepam (VALIUM) 10 MG tablet Take 10 mg by mouth daily as needed for anxiety.     . diphenoxylate-atropine  (LOMOTIL) 2.5-0.025 MG tablet Take 1 tablet by mouth 4 (four) times daily as needed for diarrhea or loose stools. 12 tablet 0  . DULoxetine (CYMBALTA) 60 MG capsule Take 1 capsule (60 mg total) by mouth daily. 30 capsule 4  . glucose blood (ACCU-CHEK AVIVA) test strip Use as instructed four times daily testing E11.65 200 each 5  . insulin aspart (NOVOLOG FLEXPEN) 100 UNIT/ML FlexPen TAKE 10 TO 20 UNITS 3 TIMES A DAY WITH MEALS 15 mL 5  . Insulin Glargine (LANTUS SOLOSTAR) 100 UNIT/ML Solostar Pen Inject 60 units daily effective 15 mL 11  . lisinopril (PRINIVIL,ZESTRIL) 2.5 MG tablet TAKE ONE (1) TABLET BY MOUTH EVERY DAY 90 tablet 3  . mometasone (NASONEX) 50 MCG/ACT nasal spray Place 2 sprays into the nose daily. (Patient taking differently: Place 2 sprays into the nose daily as needed (Allergies). ) 17 g 12  . montelukast (SINGULAIR) 10 MG tablet TAKE ONE TABLET BY MOUTH EVERY NIGHT AT BEDTIME 30 tablet 3  . omeprazole (PRILOSEC) 40 MG capsule 1 po 30 mins prior to breakfast and supper 180 capsule 3  . ondansetron (ZOFRAN) 4 MG tablet Take 1 tablet (4 mg total) by mouth every 8 (eight) hours as needed for nausea or vomiting. 60 tablet 5  . ondansetron (ZOFRAN) 4 MG tablet Take 1 tablet (4 mg total) by mouth 2 (two) times daily as needed for nausea or vomiting. 12 tablet 0  . pravastatin (PRAVACHOL) 80 MG tablet TAKE ONE TABLET BY MOUTH IN THE EVENING. 30 tablet 3   Current Facility-Administered Medications on File Prior to Visit  Medication  Dose Route Frequency Provider Last Rate Last Dose  . triamcinolone acetonide (KENALOG) 10 MG/ML injection 10 mg  10 mg Other Once [employer], DPM       No Known Allergies  Recent Results (from the past 2160 hour(s))  Hemoglobin A1c     Status: Abnormal   Collection Time: 02/06/16  7:54 AM  Result Value Ref Range   Hgb A1c MFr Bld 8.1 (H) <5.7 %    Comment:   For someone without known diabetes, a hemoglobin A1c value of 6.5% or greater indicates  that they may have diabetes and this should be confirmed with a follow-up test.   For someone with known diabetes, a value <7% indicates that their diabetes is well controlled and a value greater than or equal to 7% indicates suboptimal control. A1c targets should be individualized based on duration of diabetes, age, comorbid conditions, and other considerations.   Currently, no consensus exists for use of hemoglobin A1c for diagnosis of diabetes for children.      Mean Plasma Glucose 186 mg/dL  BASIC METABOLIC PANEL WITH GFR     Status: Abnormal   Collection Time: 02/06/16  7:54 AM  Result Value Ref Range   Sodium 140 135 - 146 mmol/L   Potassium 4.2 3.5 - 5.3 mmol/L   Chloride 105 98 - 110 mmol/L   CO2 27 20 - 31 mmol/L   Glucose, Bld 155 (H) 65 - 99 mg/dL   BUN 19 7 - 25 mg/dL   Creat 1.17 (H) 0.50 - 1.10 mg/dL   Calcium 9.3 8.6 - 10.2 mg/dL   GFR, Est African American 64 >=60 mL/min   GFR, Est Non African American 55 (L) >=60 mL/min    Comment:   The estimated GFR is a calculation valid for adults (>=54 years old) that uses the CKD-EPI algorithm to adjust for age and sex. It is   not to be used for children, pregnant women, hospitalized patients,    patients on dialysis, or with rapidly changing kidney function. According to the NKDEP, eGFR >89 is normal, 60-89 shows mild impairment, 30-59 shows moderate impairment, 15-29 shows severe impairment and <15 is ESRD.       Objective: General: Patient is awake, alert, and oriented x 3 and in no acute distress.  Integument: Skin is warm, dry and supple bilateral. Nails are tender, long, thickened and  dystrophic with subungual debris, consistent with onychomycosis, 1-5 bilateral. No signs of infection. No open lesions or preulcerative lesions present bilateral. Remaining integument unremarkable.  Vasculature:  Dorsalis Pedis pulse 1/4 bilateral. Posterior Tibial pulse 1/4 bilateral.  Capillary fill time <3 sec 1-5  bilateral. Decreased hair growth to the level of the digits. Temperature gradient within normal limits. Mild varicosities present bilateral. No edema present bilateral.   Neurology: The patient has absent sensation measured with a 5.07/10g Semmes Weinstein Monofilament at all pedal sites bilateral . Vibratory sensation absent bilateral with tuning fork. No Babinski sign present bilateral.   Musculoskeletal: Asymptomatic varus 4-5 hammertoes pedal deformities noted bilateral. No pain to plantar fascia bilateral. Muscular strength 4/5 in all lower extremity muscular groups bilateral without pain on range of motion . No tenderness with calf compression bilateral.  Assessment and Plan: Problem List Items Addressed This Visit    None    Visit Diagnoses    Dermatophytosis of nail    -  Primary    Diabetic polyneuropathy associated with type 2 diabetes mellitus ([place])  Foot pain, bilateral           -Examined patient. -Discussed and educated patient on diabetic foot care, especially with  regards to the vascular, neurological and musculoskeletal systems.  -Stressed the importance of good glycemic control and the detriment of not  controlling glucose levels in relation to the foot. -Mechanically debrided all nails 1-5 bilateral using sterile nail nipper and filed with dremel without incident  -Advised patient to continue stretching to prevent re-occurence of fasciitis -Answered all patient questions -Patient to return  in 3 months for at risk foot care -Patient advised to call the office if any problems or questions arise in the meantime.  Landis Martins, DPM

## 2016-05-02 ENCOUNTER — Ambulatory Visit (INDEPENDENT_AMBULATORY_CARE_PROVIDER_SITE_OTHER): Payer: Medicare Other | Admitting: Gastroenterology

## 2016-05-02 ENCOUNTER — Encounter: Payer: Self-pay | Admitting: Gastroenterology

## 2016-05-02 ENCOUNTER — Other Ambulatory Visit: Payer: Self-pay

## 2016-05-02 VITALS — BP 129/80 | HR 78 | Temp 96.4°F | Ht 68.0 in | Wt 124.2 lb

## 2016-05-02 DIAGNOSIS — K3184 Gastroparesis: Secondary | ICD-10-CM | POA: Diagnosis not present

## 2016-05-02 DIAGNOSIS — R194 Change in bowel habit: Secondary | ICD-10-CM

## 2016-05-02 MED ORDER — PEG-KCL-NACL-NASULF-NA ASC-C 100 G PO SOLR: 1.0000 | ORAL | Status: DC

## 2016-05-02 NOTE — Progress Notes (Signed)
Subjective:    Patient ID: Ashley Armstrong, female    DOB: 1966/11/02, 50 y.o.   MRN: [id number]  [name], MD  HPI VOMITED A COUPLE TIMES x2 IN MAY 2017-NO BLOOD & ASSOCIATED WITH BAD HEADACHE.CHANGE IN BOWEL IN HABITS-WAS REGULAR AND NOW  HAVING TROUBLE WITH MOVING BOWELS FOR PAST 2 WEEKS. BEEN ON ABX AND PAIN MEDS FOR TOOTH PAIN. NO CHANGE IN MEDS. LAST DOSE PAIN MEDS JUN 6.  APPETITE: GOOD. WEIGHT:  PAIN ( SHARP) IN LEFT SIDE FOR COUPLE WEEKS, COMES AND GOES. FEELS HEART BEAT FAST SOMETIME. LAST ABX 1 WEEK AGO.  PT DENIES FEVER, CHILLS, HEMATOCHEZIA, nausea, melena, diarrhea, CHEST PAIN, SHORTNESS OF BREATH,  problems swallowing, problems with sedation, OR heartburn or indigestion.  Past Medical History  Diagnosis Date  . Nicotine addiction   . Anxiety   . IDDM (insulin dependent diabetes mellitus) ([place])   . Dyslipidemia   . Headache(784.0)   . TIA (transient ischemic attack)     Dr. Merlene Laughter  . Gastroparesis due to DM ([place]) OCT 2014    75% AT 2 HRS, GLU >   . Neuropathy ([place])    Past Surgical History  Procedure Laterality Date  . Laser surgery bilateral eye    . Esophagogastroduodenoscopy N/A 08/31/2013    SLF: 1. [place]y satiety nausea may be due to Gastroparesis/pyloric channel stenosis. 2. small hiatal hernia 3. Moderate erosive gastritis.     No Known Allergies  Current Outpatient Prescriptions  Medication Sig Dispense Refill  .      .      . diazepam (VALIUM) 10 MG tablet Take 10 mg by mouth daily as needed for anxiety.     .      . CYMBALTA 60 MG capsule Take 1 capsule (60 mg total) by mouth daily.    .      . insulin aspart (NOVOLOG FLEXPEN) 100 UNIT/ML FlexPen TAKE 10 TO 20 UNITS 3 TIMES A DAY WITH MEALS    . LANTUS SOLOSTAR Solostar Pen Inject 60 units daily AT 3 PM    . lisinopril 2.5 MG tablet TAKE ONE (1) TABLET BY MOUTH EVERY DAY    . mometasone MCG/ACT nasal spray Place 2 sprays into the nose daily.     . montelukast (SINGULAIR) 10 MG tablet TAKE  ONE TABLET BY MOUTH EVERY NIGHT AT BEDTIME    . PRILOSEC) 40 MG capsule 1 po 30 mins prior to breakfast and supper    . ondansetron (ZOFRAN) 4 MG tablet Take 1 tablet POQ8H as needed for nausea or vomiting. QOD   .      Marland Kitchen pravastatin (PRAVACHOL) 80 MG tablet TAKE ONE TABLET BY MOUTH IN THE EVENING.     Review of Systems PER HPI OTHERWISE ALL SYSTEMS ARE NEGATIVE.    Objective:   Physical Exam  Constitutional: She is oriented to person, place, and time. She appears well-developed and well-nourished. No distress.  HENT:  Head: Normocephalic and atraumatic.  Mouth/Throat: Oropharynx is clear and moist. No oropharyngeal exudate.  Eyes: Pupils are equal, round, and reactive to light. No scleral icterus.  Neck: Normal range of motion. Neck supple.  Cardiovascular: Normal rate, regular rhythm and normal heart sounds.   Pulmonary/Chest: Effort normal and breath sounds normal. No respiratory distress.  Abdominal: Soft. Bowel sounds are normal. She exhibits no distension. There is tenderness. There is no rebound and no guarding.  MILD TTP IN LUQ/LLQ, MILD BULGE IN MIDLINE   Musculoskeletal: She exhibits no  edema.  Lymphadenopathy:    She has no cervical adenopathy.  Neurological: She is alert and oriented to person, place, and time.  NO  NEW FOCAL DEFICITS  Psychiatric: She has a normal mood and affect.  Vitals reviewed.     Assessment & Plan:

## 2016-05-02 NOTE — Assessment & Plan Note (Addendum)
WEIGHT UP. ASSOCIATED WITH LEFT SIDED ABDOMINAL PAIN. DIFFERENTIAL DIAGNOSIS INCLUDES: IBS-C, LESS LIKELY COLON CANCER. LAST CT SEP 2015-NAIAP.  DRINK WATER TO KEEP URINE LIGHT YELLOW. USE FIBER POWDER 1 PACKET ONCE DAILY FOR 3 DAYS THEN TWICE DAILY. ADD LINZESS 30 MINS PRIOR TO BREAKFAST. IT MAY CAUSE EXPLOSIVE DIARRHEA. SAMPLES 145 MCG #8 GIVEN. ON JUN 27 FOLLOW A FULL LIQUID DIET. TAKE 1/2 YOUR LANTUS AT 3PM. START YOUR BOWEL PREP. USE NOVOLOG SLIDING SCALE. ON JUN 28 YOU WILL HAVE YOUR COLONOSCOPY. DISCUSSED PROCEDURE, BENEFITS, & RISKS: < 1% chance of medication reaction, bleeding, perforation, or rupture of spleen/liver. PHENERGAN 12.5 MG IV IN PREOP. FOLLOW UP IN 4 MOS.

## 2016-05-02 NOTE — Patient Instructions (Signed)
DRINK WATER TO KEEP YOUR URINE LIGHT YELLOW.  USE FIBER POWDER 1 PACKET ONCE DAILY FOR 3 DAYS THEN TWICE DAILY.  ADD LINZESS 30 MINS PRIOR TO BREAKFAST. IT MAY CAUSE EXPLOSIVE DIARRHEA.   ON JUN 27 FOLLOW A FULL LIQUID DIET. TAKE 1/2 YOUR LANTUS AT 3PM. START YOUR BOWEL PREP. USE NOVOLOG SLIDING SCALE.  ON JUN 28 YOU WILL HAVE YOUR COLONOSCOPY.  FOLLOW UP IN 4 MOS.

## 2016-05-02 NOTE — Assessment & Plan Note (Signed)
SYMPTOMS FAIRLY WELL CONTROLLED. ONLY TWO EXACERBATIONS SINCE DEC 2016.  CONTINUE TO MONITOR SYMPTOMS. ZOFRAN PRN

## 2016-05-02 NOTE — Progress Notes (Signed)
cc'ed to pcp °

## 2016-05-04 ENCOUNTER — Other Ambulatory Visit: Payer: Self-pay | Admitting: Family Medicine

## 2016-05-06 NOTE — Progress Notes (Signed)
ON RECALL  °

## 2016-05-09 ENCOUNTER — Encounter (HOSPITAL_COMMUNITY)
Admission: RE | Disposition: A | Discharge status: Home / Self Care | Payer: Self-pay | Source: Ambulatory Visit | Attending: Gastroenterology

## 2016-05-09 ENCOUNTER — Encounter (HOSPITAL_COMMUNITY): Payer: Self-pay | Admitting: *Deleted

## 2016-05-09 ENCOUNTER — Ambulatory Visit (HOSPITAL_COMMUNITY)
Admission: RE | Admit: 2016-05-09 | Discharge: 2016-05-09 | Disposition: A | Discharge status: Home / Self Care | Payer: Medicaid Other | Source: Ambulatory Visit | Attending: Gastroenterology | Admitting: Gastroenterology

## 2016-05-09 DIAGNOSIS — Z8673 Personal history of transient ischemic attack (TIA), and cerebral infarction without residual deficits: Secondary | ICD-10-CM | POA: Diagnosis not present

## 2016-05-09 DIAGNOSIS — R194 Change in bowel habit: Secondary | ICD-10-CM | POA: Diagnosis not present

## 2016-05-09 DIAGNOSIS — E119 Type 2 diabetes mellitus without complications: Secondary | ICD-10-CM | POA: Diagnosis not present

## 2016-05-09 DIAGNOSIS — Z794 Long term (current) use of insulin: Secondary | ICD-10-CM | POA: Insufficient documentation

## 2016-05-09 DIAGNOSIS — F419 Anxiety disorder, unspecified: Secondary | ICD-10-CM | POA: Diagnosis not present

## 2016-05-09 DIAGNOSIS — D125 Benign neoplasm of sigmoid colon: Secondary | ICD-10-CM | POA: Insufficient documentation

## 2016-05-09 DIAGNOSIS — K621 Rectal polyp: Secondary | ICD-10-CM | POA: Diagnosis not present

## 2016-05-09 DIAGNOSIS — E785 Hyperlipidemia, unspecified: Secondary | ICD-10-CM | POA: Diagnosis not present

## 2016-05-09 HISTORY — PX: COLONOSCOPY: SHX5424

## 2016-05-09 LAB — GLUCOSE, CAPILLARY: GLUCOSE-CAPILLARY: 316 mg/dL — AB (ref 65–99)

## 2016-05-09 SURGERY — COLONOSCOPY
Anesthesia: Moderate Sedation

## 2016-05-09 MED ORDER — MIDAZOLAM HCL 5 MG/5ML IJ SOLN
INTRAMUSCULAR | Status: DC | PRN
  Administered 2016-05-09 (×2): 2 mg via INTRAVENOUS
  Administered 2016-05-09: 1 mg via INTRAVENOUS

## 2016-05-09 MED ORDER — MEPERIDINE HCL 100 MG/ML IJ SOLN
INTRAMUSCULAR | Status: DC
Start: 2016-05-09 — End: 2016-05-09
  Filled 2016-05-09: qty 2

## 2016-05-09 MED ORDER — LINACLOTIDE 145 MCG PO CAPS
ORAL_CAPSULE | ORAL | Status: DC
Start: 2016-05-09 — End: 2016-09-05

## 2016-05-09 MED ORDER — PROMETHAZINE HCL 25 MG/ML IJ SOLN
INTRAMUSCULAR | Status: AC
  Filled 2016-05-09: qty 1

## 2016-05-09 MED ORDER — MEPERIDINE HCL 100 MG/ML IJ SOLN
INTRAMUSCULAR | Status: DC | PRN
  Administered 2016-05-09 (×2): 50 mg via INTRAVENOUS

## 2016-05-09 MED ORDER — SODIUM CHLORIDE 0.9% FLUSH
INTRAVENOUS | Status: AC
  Filled 2016-05-09: qty 10

## 2016-05-09 MED ORDER — MIDAZOLAM HCL 5 MG/5ML IJ SOLN
INTRAMUSCULAR | Status: AC
  Filled 2016-05-09: qty 10

## 2016-05-09 MED ORDER — PROMETHAZINE HCL 25 MG/ML IJ SOLN
INTRAMUSCULAR | Status: DC | PRN
  Administered 2016-05-09: 12.5 mg via INTRAVENOUS

## 2016-05-09 MED ORDER — PROMETHAZINE HCL 25 MG/ML IJ SOLN
12.5000 mg | Freq: Once | INTRAMUSCULAR | Status: AC
  Administered 2016-05-09: 12.5 mg via INTRAVENOUS

## 2016-05-09 MED ORDER — SODIUM CHLORIDE 0.9 % IV SOLN
INTRAVENOUS | Status: DC
  Administered 2016-05-09: 13:00:00 via INTRAVENOUS

## 2016-05-09 NOTE — Interval H&P Note (Signed)
History and Physical Interval Note:  05/09/2016 12:54 PM  Ashley Armstrong  has presented today for surgery, with the diagnosis of CHANGE IN BOWEL HABITS  The various methods of treatment have been discussed with the patient and family. After consideration of risks, benefits and other options for treatment, the patient has consented to  Procedure(s) with comments: COLONOSCOPY (N/A) - 130 as a surgical intervention .  The patient's history has been reviewed, patient examined, no change in status, stable for surgery.  I have reviewed the patient's chart and labs.  Questions were answered to the patient's satisfaction.     [employer]

## 2016-05-09 NOTE — Op Note (Signed)
[facility] Patient Name: Ashley Armstrong Procedure Date: 05/09/2016 12:58 PM MRN: [id number] Date of Birth: 01/17/1966 Attending MD: Barney Drain , MD CSN: [id number] Age: 50 Admit Type: Outpatient Procedure:                Colonoscopy with COLD FORCEPS POLYPECTOMY Indications:              Incidental change in bowel habits noted Providers:                Barney Drain, MD, Rosina Lowenstein, RN, [place] [employer].                            Tina Griffiths, Technician Referring MD:              Medicines:                Meperidine 100 mg IV, Midazolam 6 mg IV,                            Promethazine 25 mg IV Complications:            No immediate complications. Estimated Blood Loss:     Estimated blood loss was minimal. Procedure:                Pre-Anesthesia Assessment:                           - Prior to the procedure, a History and Physical                            was performed, and patient medications and                            allergies were reviewed. The patient's tolerance of                            previous anesthesia was also reviewed. The risks                            and benefits of the procedure and the sedation                            options and risks were discussed with the patient.                            All questions were answered, and informed consent                            was obtained. Prior Anticoagulants: The patient has                            taken no previous anticoagulant or antiplatelet                            agents. ASA Grade Assessment: II - A patient with  mild systemic disease. After reviewing the risks                            and benefits, the patient was deemed in                            satisfactory condition to undergo the procedure.                           After obtaining informed consent, the colonoscope                            was passed under direct vision. Throughout the     procedure, the patient's blood pressure, pulse, and                            oxygen saturations were monitored continuously. The                            [id number]) scope was introduced through                            the anus and advanced to the 15 cm into the ileum.                            The terminal ileum, ileocecal valve, appendiceal                            orifice, and rectum were photographed. The                            colonoscopy was technically difficult and complex                            due to significant looping. Successful completion                            of the procedure was aided by increasing the dose                            of sedation medication, straightening and                            shortening the scope to obtain bowel loop reduction                            and COLOWRAP. The patient tolerated the procedure                            fairly well. The quality of the bowel preparation                            was excellent. Scope In: 1:19:35 PM Scope Out: 1:37:25 PM Scope Withdrawal Time: 0  hours 12 minutes 43 seconds  Total Procedure Duration: 0 hours 17 minutes 50 seconds  Findings:      The terminal ileum appeared normal.      Two sessile polyps were found in the rectum and sigmoid colon. The       polyps were 2 to 4 mm in size. These polyps were removed with a cold       biopsy forceps. Resection and retrieval were complete.      The recto-sigmoid colon, sigmoid colon and transverse colon were       significantly redundant.      The retroflexed view of the distal rectum and anal verge was normal and       showed no anal or rectal abnormalities. Impression:               - The examined portion of the ileum was normal.                           - Two 2 to 4 mm polyps in the rectum and in the                            sigmoid colon, removed with a cold biopsy forceps.                            Resected and  retrieved.                           - Redundant colon. Moderate Sedation:      Moderate (conscious) sedation was administered by the endoscopy nurse       and supervised by the endoscopist. The following parameters were       monitored: oxygen saturation, heart rate, blood pressure, and response       to care. Total physician intraservice time was 38 minutes. Recommendation:           - Resume previous diet.                           - Continue present medications.                           - Await pathology results.                           - Repeat colonoscopy in 5-10 years for surveillance.                           - Return to my office in 3 months.                           DRINK WATER TO KEEP YOUR URINE LIGHT YELLOW.                           USE FIBER POWDER 1 PACKET ONCE DAILY FOR 3 DAYS                            THEN TWICE DAILY.  CONTINUE LINZESS 30 MINS PRIOR TO BREAKFAST.                           - Patient has a contact number available for                            emergencies. The signs and symptoms of potential                            delayed complications were discussed with the                            patient. Return to normal activities tomorrow.                            Written discharge instructions were provided to the                            patient. Procedure Code(s):        --- Professional ---                           [phone], Colonoscopy, flexible; with biopsy, single                            or multiple                           99152, Moderate sedation services provided by the                            same physician or other qualified health care                            professional performing the diagnostic or                            therapeutic service that the sedation supports,                            requiring the presence of an independent trained                            observer to assist in the  monitoring of the                            patient's level of consciousness and physiological                            status; initial 15 minutes of intraservice time,                            patient age 42 years or older  [id number], Moderate sedation services; each additional                            15 minutes intraservice time                           99153, Moderate sedation services; each additional                            15 minutes intraservice time Diagnosis Code(s):        --- Professional ---                           K62.1, Rectal polyp                           D12.5, Benign neoplasm of sigmoid colon                           Q43.8, Other specified congenital malformations of                            intestine CPT copyright 2016 [employer]. All rights reserved. The codes documented in this report are preliminary and upon coder review may  be revised to meet current compliance requirements. Barney Drain, MD Barney Drain, MD 05/09/2016 4:48:35 PM This report has been signed electronically. Number of Addenda: 0

## 2016-05-09 NOTE — OR Nursing (Signed)
Dr. Oneida Alar notified of patient's blood sugar 316. Patient stated her blood sugar ran between 70-80 all night and she drank [facility] this morning to bring it up. Will proceed with procedure as planned.

## 2016-05-09 NOTE — Discharge Instructions (Signed)
You had 2 small polyps removed. YOU HAVE A FLOPPY LEFT COLON.   DRINK WATER TO KEEP YOUR URINE LIGHT YELLOW.  USE FIBER POWDER 1 PACKET ONCE DAILY FOR 3 DAYS THEN TWICE DAILY.  CONTINUE LINZESS 30 MINS PRIOR TO BREAKFAST.   YOUR BIOPSY RESULTS WILL BE AVAILABLE IN MY CHART AFTER JUL 3 AND MY OFFICE WILL CONTACT YOU IN 10-14 DAYS WITH YOUR RESULTS.   Next colonoscopy in 5-10 years.  Colonoscopy Care After Read the instructions outlined below and refer to this sheet in the next week. These discharge instructions provide you with general information on caring for yourself after you leave the hospital. While your treatment has been planned according to the most current medical practices available, unavoidable complications occasionally occur. If you have any problems or questions after discharge, call DR. Bryli Mantey, [phone].  ACTIVITY  You may resume your regular activity, but move at a slower pace for the next 24 hours.   Take frequent rest periods for the next 24 hours.   Walking will help get rid of the air and reduce the bloated feeling in your belly (abdomen).   No driving for 24 hours (because of the medicine (anesthesia) used during the test).   You may shower.   Do not sign any important legal documents or operate any machinery for 24 hours (because of the anesthesia used during the test).    NUTRITION  Drink plenty of fluids.   You may resume your normal diet as instructed by your doctor.   Begin with a light meal and progress to your normal diet. Heavy or fried foods are harder to digest and may make you feel sick to your stomach (nauseated).   Avoid alcoholic beverages for 24 hours or as instructed.    MEDICATIONS  You may resume your normal medications.   WHAT YOU CAN EXPECT TODAY  Some feelings of bloating in the abdomen.   Passage of more gas than usual.   Spotting of blood in your stool or on the toilet paper  .  IF YOU HAD POLYPS REMOVED DURING  THE COLONOSCOPY:  Eat a soft diet IF YOU HAVE NAUSEA, BLOATING, ABDOMINAL PAIN, OR VOMITING.    FINDING OUT THE RESULTS OF YOUR TEST Not all test results are available during your visit. DR. Oneida Alar WILL CALL YOU WITHIN 14 DAYS OF YOUR PROCEDUE WITH YOUR RESULTS. Do not assume everything is normal if you have not heard from DR. Purl Claytor, CALL HER OFFICE AT [phone].  SEEK IMMEDIATE MEDICAL ATTENTION AND CALL THE OFFICE: [phone] IF:  You have more than a spotting of blood in your stool.   Your belly is swollen (abdominal distention).   You are nauseated or vomiting.   You have a temperature over 101F.   You have abdominal pain or discomfort that is severe or gets worse throughout the day.   High-Fiber Diet A high-fiber diet changes your normal diet to include more whole grains, legumes, fruits, and vegetables. Changes in the diet involve replacing refined carbohydrates with unrefined foods. The calorie level of the diet is essentially unchanged. The Dietary Reference Intake (recommended amount) for adult males is 38 grams per day. For adult females, it is 25 grams per day. Pregnant and lactating women should consume 28 grams of fiber per day. Fiber is the intact part of a plant that is not broken down during digestion. Functional fiber is fiber that has been isolated from the plant to provide a beneficial effect in the body. PURPOSE  Increase stool bulk.   Ease and regulate bowel movements.   Lower cholesterol.  REDUCE RISK OF COLON CANCER  INDICATIONS THAT YOU NEED MORE FIBER  Constipation and hemorrhoids.   Uncomplicated diverticulosis (intestine condition) and irritable bowel syndrome.   Weight management.   As a protective measure against hardening of the arteries (atherosclerosis), diabetes, and cancer.   GUIDELINES FOR INCREASING FIBER IN THE DIET  Start adding fiber to the diet slowly. A gradual increase of about 5 more grams (2 slices of whole-wheat bread, 2  servings of most fruits or vegetables, or 1 bowl of high-fiber cereal) per day is best. Too rapid an increase in fiber may result in constipation, flatulence, and bloating.   Drink enough water and fluids to keep your urine clear or pale yellow. Water, juice, or caffeine-free drinks are recommended. Not drinking enough fluid may cause constipation.   Eat a variety of high-fiber foods rather than one type of fiber.   Try to increase your intake of fiber through using high-fiber foods rather than fiber pills or supplements that contain small amounts of fiber.   The goal is to change the types of food eaten. Do not supplement your present diet with high-fiber foods, but replace foods in your present diet.   INCLUDE A VARIETY OF FIBER SOURCES  Replace refined and processed grains with whole grains, canned fruits with fresh fruits, and incorporate other fiber sources. White rice, white breads, and most bakery goods contain little or no fiber.   Brown whole-grain rice, buckwheat oats, and many fruits and vegetables are all good sources of fiber. These include: broccoli, Brussels sprouts, cabbage, cauliflower, beets, sweet potatoes, white potatoes (skin on), carrots, tomatoes, eggplant, squash, berries, fresh fruits, and dried fruits.   Cereals appear to be the richest source of fiber. Cereal fiber is found in whole grains and bran. Bran is the fiber-rich outer coat of cereal grain, which is largely removed in refining. In whole-grain cereals, the bran remains. In breakfast cereals, the largest amount of fiber is found in those with "bran" in their names. The fiber content is sometimes indicated on the label.   You may need to include additional fruits and vegetables each day.   In baking, for 1 cup white flour, you may use the following substitutions:   1 cup whole-wheat flour minus 2 tablespoons.   1/2 cup white flour plus 1/2 cup whole-wheat flour.   Polyps, Colon  A polyp is extra tissue that  grows inside your body. Colon polyps grow in the large intestine. The large intestine, also called the colon, is part of your digestive system. It is a long, hollow tube at the end of your digestive tract where your body makes and stores stool. Most polyps are not dangerous. They are benign. This means they are not cancerous. But over time, some types of polyps can turn into cancer. Polyps that are smaller than a pea are usually not harmful. But larger polyps could someday become or may already be cancerous. To be safe, doctors remove all polyps and test them.   PREVENTION There is not one sure way to prevent polyps. You might be able to lower your risk of getting them if you:  Eat more fruits and vegetables and less fatty food.   Do not smoke.   Avoid alcohol.   Exercise every day.   Lose weight if you are overweight.   Eating more calcium and folate can also lower your risk of getting polyps. Some  foods that are rich in calcium are milk, cheese, and broccoli. Some foods that are rich in folate are chickpeas, kidney beans, and spinach.

## 2016-05-09 NOTE — H&P (View-Only) (Signed)
Subjective:    Patient ID: Ashley Armstrong, female    DOB: 25-Jan-1966, 50 y.o.   MRN: [id number]  Ashley Nakayama, MD  HPI VOMITED A COUPLE TIMES x2 IN MAY 2017-NO BLOOD & ASSOCIATED WITH BAD HEADACHE.CHANGE IN BOWEL IN HABITS-WAS REGULAR AND NOW  HAVING TROUBLE WITH MOVING BOWELS FOR PAST 2 WEEKS. BEEN ON ABX AND PAIN MEDS FOR TOOTH PAIN. NO CHANGE IN MEDS. LAST DOSE PAIN MEDS JUN 6.  APPETITE: GOOD. WEIGHT:  PAIN ( SHARP) IN LEFT SIDE FOR COUPLE WEEKS, COMES AND GOES. FEELS HEART BEAT FAST SOMETIME. LAST ABX 1 WEEK AGO.  PT DENIES FEVER, CHILLS, HEMATOCHEZIA, nausea, melena, diarrhea, CHEST PAIN, SHORTNESS OF BREATH,  problems swallowing, problems with sedation, OR heartburn or indigestion.  Past Medical History  Diagnosis Date  . Nicotine addiction   . Anxiety   . IDDM (insulin dependent diabetes mellitus) ([place])   . Dyslipidemia   . Headache(784.0)   . TIA (transient ischemic attack)     Dr. Merlene Laughter  . Gastroparesis due to DM ([place]) OCT 2014    75% AT 2 HRS, GLU >   . Neuropathy ([place])    Past Surgical History  Procedure Laterality Date  . Laser surgery bilateral eye    . Esophagogastroduodenoscopy N/A 08/31/2013    SLF: 1. Earky satiety nausea may be due to Gastroparesis/pyloric channel stenosis. 2. small hiatal hernia 3. Moderate erosive gastritis.     No Known Allergies  Current Outpatient Prescriptions  Medication Sig Dispense Refill  .      .      . diazepam (VALIUM) 10 MG tablet Take 10 mg by mouth daily as needed for anxiety.     .      . CYMBALTA 60 MG capsule Take 1 capsule (60 mg total) by mouth daily.    .      . insulin aspart (NOVOLOG FLEXPEN) 100 UNIT/ML FlexPen TAKE 10 TO 20 UNITS 3 TIMES A DAY WITH MEALS    . LANTUS SOLOSTAR Solostar Pen Inject 60 units daily AT 3 PM    . lisinopril 2.5 MG tablet TAKE ONE (1) TABLET BY MOUTH EVERY DAY    . mometasone MCG/ACT nasal spray Place 2 sprays into the nose daily.     . montelukast (SINGULAIR) 10 MG tablet TAKE  ONE TABLET BY MOUTH EVERY NIGHT AT BEDTIME    . PRILOSEC) 40 MG capsule 1 po 30 mins prior to breakfast and supper    . ondansetron (ZOFRAN) 4 MG tablet Take 1 tablet POQ8H as needed for nausea or vomiting. QOD   .      Marland Kitchen pravastatin (PRAVACHOL) 80 MG tablet TAKE ONE TABLET BY MOUTH IN THE EVENING.     Review of Systems PER HPI OTHERWISE ALL SYSTEMS ARE NEGATIVE.    Objective:   Physical Exam  Constitutional: She is oriented to person, place, and time. She appears well-developed and well-nourished. No distress.  HENT:  Head: Normocephalic and atraumatic.  Mouth/Throat: Oropharynx is clear and moist. No oropharyngeal exudate.  Eyes: Pupils are equal, round, and reactive to light. No scleral icterus.  Neck: Normal range of motion. Neck supple.  Cardiovascular: Normal rate, regular rhythm and normal heart sounds.   Pulmonary/Chest: Effort normal and breath sounds normal. No respiratory distress.  Abdominal: Soft. Bowel sounds are normal. She exhibits no distension. There is tenderness. There is no rebound and no guarding.  MILD TTP IN LUQ/LLQ, MILD BULGE IN MIDLINE   Musculoskeletal: She exhibits no  edema.  Lymphadenopathy:    She has no cervical adenopathy.  Neurological: She is alert and oriented to person, place, and time.  NO  NEW FOCAL DEFICITS  Psychiatric: She has a normal mood and affect.  Vitals reviewed.     Assessment & Plan:

## 2016-05-09 NOTE — Addendum Note (Signed)
Addended by: Danie Binder on: 05/09/2016 04:44 PM   Modules accepted: Orders

## 2016-05-13 ENCOUNTER — Telehealth: Payer: Self-pay | Admitting: Gastroenterology

## 2016-05-13 ENCOUNTER — Ambulatory Visit: Payer: Medicaid Other | Admitting: Family Medicine

## 2016-05-13 ENCOUNTER — Telehealth: Payer: Self-pay

## 2016-05-13 NOTE — Telephone Encounter (Signed)
Pt said she had her colonoscopy on 05/09/2016 and since has not had much of a Bm, just a little diarrhea or few balls. She is taking the Linzess 145 mcg and wants to know if she should increase. She said her left side is just more uncomfortable instead of pain. Please advise!  Routing to Laban Emperor, NP in Dr. Oneida Alar absence.

## 2016-05-13 NOTE — Telephone Encounter (Signed)
See separate phone note, I spoke with the pt.

## 2016-05-13 NOTE — Telephone Encounter (Signed)
PT is aware and I called the Rx to Tammy at [facility].

## 2016-05-13 NOTE — Telephone Encounter (Signed)
See previous phone note today. 

## 2016-05-13 NOTE — Telephone Encounter (Signed)
May trial 290 mcg. May call this in, Linzess 290 mcg, take one each morning 30 minutes before breakfast. Disp # 90 with 3 refills.

## 2016-05-13 NOTE — Telephone Encounter (Signed)
[phone] PLEASE CALL PATIENT, SHE HAD A TCS AND WAS GIVEN LINZESS, STILL NOT HAD A GOOD BOWEL MOVEMENT AND IS HAVING PAIN IN HER SIDE.  PLEASE ADVISE

## 2016-05-15 ENCOUNTER — Encounter (HOSPITAL_COMMUNITY): Payer: Self-pay | Admitting: Gastroenterology

## 2016-05-16 NOTE — Progress Notes (Signed)
Quick Note:  Pt is aware. ______ 

## 2016-05-21 DIAGNOSIS — E103299 Type 1 diabetes mellitus with mild nonproliferative diabetic retinopathy without macular edema, unspecified eye: Secondary | ICD-10-CM | POA: Diagnosis not present

## 2016-05-21 DIAGNOSIS — E559 Vitamin D deficiency, unspecified: Secondary | ICD-10-CM | POA: Diagnosis not present

## 2016-05-21 DIAGNOSIS — E1065 Type 1 diabetes mellitus with hyperglycemia: Secondary | ICD-10-CM | POA: Diagnosis not present

## 2016-05-21 DIAGNOSIS — E785 Hyperlipidemia, unspecified: Secondary | ICD-10-CM | POA: Diagnosis not present

## 2016-05-22 LAB — COMPLETE METABOLIC PANEL WITH GFR
ALT: 17 U/L (ref 6–29)
AST: 17 U/L (ref 10–35)
Albumin: 3.8 g/dL (ref 3.6–5.1)
Alkaline Phosphatase: 70 U/L (ref 33–115)
BUN: 20 mg/dL (ref 7–25)
CO2: 26 mmol/L (ref 20–31)
Calcium: 9.3 mg/dL (ref 8.6–10.2)
Chloride: 105 mmol/L (ref 98–110)
Creat: 1.12 mg/dL — ABNORMAL HIGH (ref 0.50–1.10)
GFR, EST NON AFRICAN AMERICAN: 58 mL/min — AB (ref >=60)
GFR, Est African American: 67 mL/min (ref >=60)
GLUCOSE: 172 mg/dL — AB (ref 65–99)
POTASSIUM: 4.1 mmol/L (ref 3.5–5.3)
SODIUM: 138 mmol/L (ref 135–146)
Total Bilirubin: 0.4 mg/dL (ref 0.2–1.2)
Total Protein: 6.2 g/dL (ref 6.1–8.1)

## 2016-05-22 LAB — LIPID PANEL
CHOLESTEROL: 174 mg/dL (ref 125–200)
HDL: 61 mg/dL (ref >=46)
LDL CALC: 90 mg/dL (ref <130)
Total CHOL/HDL Ratio: 2.9 Ratio (ref <=5.0)
Triglycerides: 115 mg/dL (ref <150)
VLDL: 23 mg/dL (ref <30)

## 2016-05-22 LAB — VITAMIN D 25 HYDROXY (VIT D DEFICIENCY, FRACTURES): Vit D, 25-Hydroxy: 42 ng/mL (ref 30–100)

## 2016-05-22 LAB — HEMOGLOBIN A1C
Hgb A1c MFr Bld: 8.4 % — ABNORMAL HIGH (ref <5.7)
MEAN PLASMA GLUCOSE: 194 mg/dL

## 2016-05-23 ENCOUNTER — Other Ambulatory Visit: Payer: Self-pay

## 2016-05-23 ENCOUNTER — Ambulatory Visit (INDEPENDENT_AMBULATORY_CARE_PROVIDER_SITE_OTHER): Payer: Medicaid Other | Admitting: Family Medicine

## 2016-05-23 ENCOUNTER — Encounter: Payer: Self-pay | Admitting: Family Medicine

## 2016-05-23 VITALS — BP 120/70 | HR 82 | Resp 18 | Ht 68.0 in | Wt 124.0 lb

## 2016-05-23 DIAGNOSIS — E785 Hyperlipidemia, unspecified: Secondary | ICD-10-CM | POA: Diagnosis not present

## 2016-05-23 DIAGNOSIS — F411 Generalized anxiety disorder: Secondary | ICD-10-CM | POA: Diagnosis not present

## 2016-05-23 DIAGNOSIS — E103299 Type 1 diabetes mellitus with mild nonproliferative diabetic retinopathy without macular edema, unspecified eye: Secondary | ICD-10-CM | POA: Diagnosis not present

## 2016-05-23 DIAGNOSIS — E559 Vitamin D deficiency, unspecified: Secondary | ICD-10-CM

## 2016-05-23 DIAGNOSIS — E1065 Type 1 diabetes mellitus with hyperglycemia: Principal | ICD-10-CM

## 2016-05-23 DIAGNOSIS — F418 Other specified anxiety disorders: Secondary | ICD-10-CM

## 2016-05-23 DIAGNOSIS — K3184 Gastroparesis: Secondary | ICD-10-CM

## 2016-05-23 DIAGNOSIS — M79602 Pain in left arm: Secondary | ICD-10-CM | POA: Diagnosis not present

## 2016-05-23 LAB — GLUCOSE, POCT (MANUAL RESULT ENTRY): POC GLUCOSE: 225 mg/dL — AB (ref 70–99)

## 2016-05-23 MED ORDER — ACCU-CHEK FASTCLIX LANCETS MISC: Status: DC

## 2016-05-23 MED ORDER — INSULIN GLARGINE 100 UNIT/ML SOLOSTAR PEN: 70.0000 [IU] | PEN_INJECTOR | Freq: Every day | SUBCUTANEOUS | Status: DC

## 2016-05-23 MED ORDER — DULOXETINE HCL 60 MG PO CPEP: 60.0000 mg | ORAL_CAPSULE | Freq: Every day | ORAL | Status: DC

## 2016-05-23 NOTE — Assessment & Plan Note (Signed)
Controlled, no change in medication  

## 2016-05-23 NOTE — Assessment & Plan Note (Signed)
Deteriorated, dose increase in lantus by 10 units , also pt to modify diet , eating carb laden snacks Ashley Armstrong is reminded of the importance of commitment to daily physical activity for 30 minutes or more, as able and the need to limit carbohydrate intake to 30 to 60 grams per meal to help with blood sugar control.   The need to take medication as prescribed, test blood sugar as directed, and to call between visits if there is a concern that blood sugar is uncontrolled is also discussed.   Ashley Armstrong is reminded of the importance of daily foot exam, annual eye examination, and good blood sugar, blood pressure and cholesterol control.  Diabetic Labs Latest Ref Rng 05/21/2016 02/06/2016 11/09/2015 08/07/2015 05/04/2015  HbA1c <5.7 % 8.4(H) 8.1(H) 8.3(H) 7.9(H) 10.0(H)  Microalbumin <2.0 mg/dL - - - 17.9(H) -  Micro/Creat Ratio 0.0 - 30.0 mg/g - - - 188.4(H) -  Chol 125 - 200 mg/dL 174 - - - -  HDL >=46 mg/dL 61 - - - -  Calc LDL <130 mg/dL 90 - - - -  Triglycerides <150 mg/dL 115 - - - -  Creatinine 0.50 - 1.10 mg/dL 1.12(H) 1.17(H) 1.00 1.20(H) -   BP/Weight 05/23/2016 05/09/2016 05/02/2016 02/12/2016 11/14/2015 10/12/2015 123XX123  Systolic BP 123456 [id number] 123456 123456 [id number]  Diastolic BP 70 71 80 70 80 71 74  Wt. (Lbs) 124.04 124 124.2 123.12 121.4 120.6 120  BMI 18.86 18.86 18.89 18.72 18.46 18.34 18.25   Foot/eye exam completion dates Latest Ref Rng 06/08/2015 07/08/2014  Eye Exam No Retinopathy - Retinopathy(A)  Foot Form Completion - Done -

## 2016-05-23 NOTE — Patient Instructions (Signed)
F/u in 3 month, call if you need me before  Blood sugar is increased, increase lantus to 70 units daily,  Change snacks so pre meal blood sugar is between 130 to 150  Thank you  for choosing [facility]. We consider it a privelige to serve you.  Delivering excellent health care in a caring and  compassionate way is our goal.  Partnering with you,  so that together we can achieve this goal is our strategy.    Non fast labs in 3 month

## 2016-05-23 NOTE — Assessment & Plan Note (Signed)
Managed by GI, on increased doses of linzess, still an issue, needs to improve blood sugar control

## 2016-05-23 NOTE — Progress Notes (Signed)
Ashley Armstrong     MRN: [id number]      DOB: [birth year]   HPI Ashley Armstrong is here for follow up and re-evaluation of chronic medical conditions, medication management and review of any available recent lab and radiology data.  Preventive health is updated, specifically  Cancer screening and Immunization.   Questions or concerns regarding consultations or procedures which the PT has had in the interim are  Addressed.Very good reports from opthalmology, and cardiology, working with GI on her constipation, had recent colonoscopy, and hopes to finally get her disability approved in the near future The PT denies any adverse reactions to current medications since the last visit.  Has blood sugar logs, testing and recording 3 to 4 times daily Snacks between meals are too carb laden so that pre meal blood sugars are often around 200. Will change snacks also her lantus dose is increased to 70 units . Accepts and agrees with improved blood sugar control  ROS Denies recent fever or chills. Denies sinus pressure, nasal congestion, ear pain or sore throat. Denies chest congestion, productive cough or wheezing. Denies chest pains, palpitations and leg swelling Denies abdominal pain, nausea, vomiting,diarrhea or constipation.   Denies dysuria, frequency, hesitancy or incontinence. Denies joint pain, swelling and limitation in mobility. Denies headaches, seizures, numbness, or tingling. Denies uncontrolled depression, anxiety or insomnia. Denies skin break down or rash.   PE  BP 120/70 mmHg  Pulse 82  Resp 18  Ht 5\' 8"  (1.727 m)  Wt 124 lb 0.6 oz (56.264 kg)  BMI 18.86 kg/m2  SpO2 96%  LMP 07/19/2013  Patient alert and oriented and in no cardiopulmonary distress.  HEENT: No facial asymmetry, EOMI,   oropharynx pink and moist.  Neck supple no JVD, no mass.  Chest: Clear to auscultation bilaterally.  CVS: S1, S2 no murmurs, no S3.Regular rate.  ABD: Soft non tender.   Ext: No  edema  MS: Adequate ROM spine, shoulders, hips and knees.  Skin: Intact, no ulcerations or rash noted.  Psych: Good eye contact, normal affect. Memory intact not anxious or depressed appearing.  CNS: CN 2-12 intact, power,  normal throughout.no focal deficits noted.   Assessment & Plan  Diabetes mellitus, insulin dependent (IDDM), uncontrolled Deteriorated, dose increase in lantus by 10 units , also pt to modify diet , eating carb laden snacks Ashley Armstrong is reminded of the importance of commitment to daily physical activity for 30 minutes or more, as able and the need to limit carbohydrate intake to 30 to 60 grams per meal to help with blood sugar control.   The need to take medication as prescribed, test blood sugar as directed, and to call between visits if there is a concern that blood sugar is uncontrolled is also discussed.   Ashley Armstrong is reminded of the importance of daily foot exam, annual eye examination, and good blood sugar, blood pressure and cholesterol control.  Diabetic Labs Latest Ref Rng 05/21/2016 02/06/2016 11/09/2015 08/07/2015 05/04/2015  HbA1c <5.7 % 8.4(H) 8.1(H) 8.3(H) 7.9(H) 10.0(H)  Microalbumin <2.0 mg/dL - - - 17.9(H) -  Micro/Creat Ratio 0.0 - 30.0 mg/g - - - 188.4(H) -  Chol 125 - 200 mg/dL 174 - - - -  HDL >=46 mg/dL 61 - - - -  Calc LDL <130 mg/dL 90 - - - -  Triglycerides <150 mg/dL 115 - - - -  Creatinine 0.50 - 1.10 mg/dL 1.12(H) 1.17(H) 1.00 1.20(H) -   BP/Weight 05/23/2016 05/09/2016 05/02/2016 02/12/2016  11/14/2015 10/12/2015 [id number]  Systolic BP 123456 [id number] [id number] 123456 123456 [id number]  Diastolic BP 70 71 80 70 80 71 74  Wt. (Lbs) 124.04 124 124.2 123.12 121.4 120.6 120  BMI 18.86 18.86 18.89 18.72 18.46 18.34 18.25   Foot/eye exam completion dates Latest Ref Rng 06/08/2015 07/08/2014  Eye Exam No Retinopathy - Retinopathy(A)  Foot Form Completion - Done -         Hyperlipidemia LDL goal <100 Controlled, no change in medication Hyperlipidemia:Low fat diet  discussed and encouraged.   Lipid Panel  Lab Results  Component Value Date   CHOL 174 05/21/2016   HDL 61 05/21/2016   LDLCALC 90 05/21/2016   TRIG 115 05/21/2016   CHOLHDL 2.9 05/21/2016        Generalized anxiety disorder Controlled, no change in medication   Vitamin D deficiency Controlled, no change in medication   Depression with anxiety Controlled, no change in medication   Gastroparesis Managed by GI, on increased doses of linzess, still an issue, needs to improve blood sugar control

## 2016-05-23 NOTE — Assessment & Plan Note (Signed)
Controlled, no change in medication Hyperlipidemia:Low fat diet discussed and encouraged.   Lipid Panel  Lab Results  Component Value Date   CHOL 174 05/21/2016   HDL 61 05/21/2016   LDLCALC 90 05/21/2016   TRIG 115 05/21/2016   CHOLHDL 2.9 05/21/2016

## 2016-06-15 ENCOUNTER — Other Ambulatory Visit: Payer: Self-pay | Admitting: Gastroenterology

## 2016-06-18 ENCOUNTER — Telehealth: Payer: Self-pay | Admitting: Family Medicine

## 2016-06-18 ENCOUNTER — Other Ambulatory Visit: Payer: Self-pay

## 2016-06-18 MED ORDER — INSULIN PEN NEEDLE 32G X 4 MM MISC: 5 refills | Status: DC

## 2016-06-18 NOTE — Progress Notes (Signed)
Pen needle 32 g

## 2016-06-18 NOTE — Telephone Encounter (Signed)
Ashley Armstrong is needing a Rx for insulin needles called in for her insulin pen 32 gauge, please advise?

## 2016-06-18 NOTE — Telephone Encounter (Signed)
Refill sent.

## 2016-07-10 ENCOUNTER — Encounter: Payer: Self-pay | Admitting: Gastroenterology

## 2016-08-05 ENCOUNTER — Ambulatory Visit: Payer: Medicaid Other | Admitting: Sports Medicine

## 2016-08-06 ENCOUNTER — Ambulatory Visit (INDEPENDENT_AMBULATORY_CARE_PROVIDER_SITE_OTHER): Payer: Medicaid Other | Admitting: Sports Medicine

## 2016-08-06 ENCOUNTER — Other Ambulatory Visit: Payer: Self-pay | Admitting: Gastroenterology

## 2016-08-06 ENCOUNTER — Encounter: Payer: Self-pay | Admitting: Sports Medicine

## 2016-08-06 DIAGNOSIS — E1142 Type 2 diabetes mellitus with diabetic polyneuropathy: Secondary | ICD-10-CM | POA: Diagnosis not present

## 2016-08-06 DIAGNOSIS — M79671 Pain in right foot: Secondary | ICD-10-CM | POA: Diagnosis not present

## 2016-08-06 DIAGNOSIS — M79672 Pain in left foot: Secondary | ICD-10-CM | POA: Diagnosis not present

## 2016-08-06 DIAGNOSIS — B351 Tinea unguium: Secondary | ICD-10-CM

## 2016-08-06 NOTE — Patient Instructions (Signed)

## 2016-08-06 NOTE — Progress Notes (Signed)
Patient ID: Ashley Armstrong, female   DOB: [birth year], 50 y.o.   MRN: [id number] Subjective: Ashley Armstrong is a 50 y.o. female patient with history of diabetes who presents to office today complaining of long, painful nails  while ambulating in shoes; unable to trim. Patient states that the glucose reading this morning was not recorded, a1c 8.1. Patient denies any new changes in medication or new problems. States that her heels feel better; especially after stretching. Admits that she does get tightness and stiffness to fascia and to toes. Patient denies any new cramping, numbness, burning or tingling in the legs.  Patient reports that she is happy because her disability was approved.   Patient Active Problem List   Diagnosis Date Noted  . Change in bowel habits 10/12/2015  . Seasonal allergies 11/09/2014  . Depression with anxiety 06/18/2014  . Tight fascia 01/17/2014  . Neck pain 12/07/2013  . Degenerative disc disease, cervical 12/07/2013  . Thoracic back pain 07/31/2013  . CTS (carpal tunnel syndrome) 07/08/2013  . Polyneuropathy ([place]) 07/08/2013  . Diabetic nephropathy ([place]) 06/23/2013  . TIA (transient ischemic attack) 04/28/2013  . Tennis elbow syndrome 04/15/2013  . Rotator cuff syndrome of left shoulder 04/15/2013  . Vitamin D deficiency 03/20/2013  . Onychomycosis of toenail 03/20/2013  . Left arm pain 03/19/2013  . HEADACHE 11/30/2008  . DIABETES MELLITUS, WITH RENAL COMPLICATIONS [id number]  . Chronic fatigue 10/24/2008  . Gastroparesis 06/16/2008  . Diabetes mellitus, insulin dependent (IDDM), uncontrolled ([place]) 04/13/2008  . Hyperlipidemia LDL goal <100 04/13/2008  . Generalized anxiety disorder 04/13/2008   Current Outpatient Prescriptions on File Prior to Visit  Medication Sig Dispense Refill  . ACCU-CHEK FASTCLIX LANCETS MISC Four times daily testing dx e11.65 200 each 3  . diazepam (VALIUM) 10 MG tablet Take 10 mg by mouth daily as needed for anxiety.     .  DULoxetine (CYMBALTA) 60 MG capsule Take 1 capsule (60 mg total) by mouth daily. 30 capsule 4  . glucose blood (ACCU-CHEK AVIVA) test strip Use as instructed four times daily testing E11.65 200 each 5  . insulin aspart (NOVOLOG FLEXPEN) 100 UNIT/ML FlexPen TAKE 10 TO 20 UNITS 3 TIMES A DAY WITH MEALS 15 mL 5  . Insulin Glargine (LANTUS SOLOSTAR) 100 UNIT/ML Solostar Pen Inject 70 Units into the skin daily. 15 mL 11  . Insulin Pen Needle 32G X 4 MM MISC For use with insulin four times daily DX E11.29 150 each 5  . linaclotide (LINZESS) 145 MCG CAPS capsule 1 PO 30 mins prior to your first meal 30 capsule 11  . lisinopril (PRINIVIL,ZESTRIL) 2.5 MG tablet TAKE ONE TABLET ONCE DAILY 90 tablet 1  . mometasone (NASONEX) 50 MCG/ACT nasal spray Place 2 sprays into the nose daily. (Patient taking differently: Place 2 sprays into the nose daily as needed (Allergies). ) 17 g 12  . montelukast (SINGULAIR) 10 MG tablet TAKE ONE TABLET BY MOUTH EVERY NIGHT AT BEDTIME 30 tablet 3  . omeprazole (PRILOSEC) 40 MG capsule 1 po 30 mins prior to breakfast and supper 180 capsule 3  . ondansetron (ZOFRAN) 4 MG tablet Take 1 tablet (4 mg total) by mouth 2 (two) times daily as needed for nausea or vomiting. 12 tablet 0  . ondansetron (ZOFRAN) 4 MG tablet TAKE ONE TABLET BY MOUTH EVERY EIGHT HOURS AS NEEDED FOR NAUSEA OR VOMITING 60 tablet 1  . pravastatin (PRAVACHOL) 80 MG tablet TAKE ONE TABLET BY MOUTH IN THE EVENING. 30 tablet 3  No current facility-administered medications on file prior to visit.    No Known Allergies  Recent Results (from the past 2160 hour(s))  Glucose, capillary     Status: Abnormal   Collection Time: 05/09/16 12:29 PM  Result Value Ref Range   Glucose-Capillary 316 (H) 65 - 99 mg/dL  Lipid panel     Status: None   Collection Time: 05/21/16  7:14 AM  Result Value Ref Range   Cholesterol 174 125 - 200 mg/dL   Triglycerides 115 <150 mg/dL   HDL 61 >=46 mg/dL   Total CHOL/HDL Ratio 2.9  <=5.0 Ratio   VLDL 23 <30 mg/dL   LDL Cholesterol 90 <130 mg/dL    Comment:   Total Cholesterol/HDL Ratio:CHD Risk                        Coronary Heart Disease Risk Table                                        Men       Women          1/2 Average Risk              3.4        3.3              Average Risk              5.0        4.4           2X Average Risk              9.6        7.1           3X Average Risk             23.4       11.0 Use the calculated Patient Ratio above and the CHD Risk table  to determine the patient's CHD Risk.   Hemoglobin A1c     Status: Abnormal   Collection Time: 05/21/16  7:14 AM  Result Value Ref Range   Hgb A1c MFr Bld 8.4 (H) <5.7 %    Comment:   For someone without known diabetes, a hemoglobin A1c value of 6.5% or greater indicates that they may have diabetes and this should be confirmed with a follow-up test.   For someone with known diabetes, a value <7% indicates that their diabetes is well controlled and a value greater than or equal to 7% indicates suboptimal control. A1c targets should be individualized based on duration of diabetes, age, comorbid conditions, and other considerations.   Currently, no consensus exists for use of hemoglobin A1c for diagnosis of diabetes for children.      Mean Plasma Glucose 194 mg/dL  COMPLETE METABOLIC PANEL WITH GFR     Status: Abnormal   Collection Time: 05/21/16  7:14 AM  Result Value Ref Range   Sodium 138 135 - 146 mmol/L   Potassium 4.1 3.5 - 5.3 mmol/L   Chloride 105 98 - 110 mmol/L   CO2 26 20 - 31 mmol/L   Glucose, Bld 172 (H) 65 - 99 mg/dL   BUN 20 7 - 25 mg/dL   Creat 1.12 (H) 0.50 - 1.10 mg/dL   Total Bilirubin 0.4 0.2 - 1.2 mg/dL   Alkaline Phosphatase 70 33 - 115 U/L  AST 17 10 - 35 U/L   ALT 17 6 - 29 U/L   Total Protein 6.2 6.1 - 8.1 g/dL   Albumin 3.8 3.6 - 5.1 g/dL   Calcium 9.3 8.6 - 10.2 mg/dL   GFR, Est African American 67 >=60 mL/min   GFR, Est Non African American 58  (L) >=60 mL/min  VITAMIN D 25 Hydroxy (Vit-D Deficiency, Fractures)     Status: None   Collection Time: 05/21/16  7:14 AM  Result Value Ref Range   Vit D, 25-Hydroxy 42 30 - 100 ng/mL    Comment: Vitamin D Status           25-OH Vitamin D        Deficiency                <20 ng/mL        Insufficiency         20 - 29 ng/mL        Optimal             > or = 30 ng/mL   For 25-OH Vitamin D testing on patients on D2-supplementation and patients for whom quantitation of D2 and D3 fractions is required, the QuestAssureD 25-OH VIT D, (D2,D3), LC/MS/MS is recommended: order code [id number] (patients > 2 yrs).   POCT glucose (manual entry)     Status: Abnormal   Collection Time: 05/23/16 11:12 AM  Result Value Ref Range   POC Glucose 225 (A) 70 - 99 mg/dl    Objective: General: Patient is awake, alert, and oriented x 3 and in no acute distress.  Integument: Skin is warm, dry and supple bilateral. Nails are tender, long, thickened and  dystrophic with subungual debris, consistent with onychomycosis, 1-5 bilateral. No signs of infection. No open lesions or preulcerative lesions present bilateral. Remaining integument unremarkable.  Vasculature:  Dorsalis Pedis pulse 1/4 bilateral. Posterior Tibial pulse 1/4 bilateral.  Capillary fill time <3 sec 1-5 bilateral. Decreased hair growth to the level of the digits. Temperature gradient within normal limits. Mild varicosities present bilateral. No edema present bilateral.   Neurology: The patient has absent sensation measured with a 5.07/10g Semmes Weinstein Monofilament at all pedal sites bilateral . Vibratory sensation absent bilateral with tuning fork. No Babinski sign present bilateral.   Musculoskeletal: Asymptomatic varus 4-5 hammertoes pedal deformities noted bilateral. No pain to plantar fascia bilateral. Muscular strength 4/5 in all lower extremity muscular groups bilateral without pain on range of motion. No tenderness with calf compression  bilateral.  Assessment and Plan: Problem List Items Addressed This Visit    None    Visit Diagnoses    Dermatophytosis of nail    -  Primary   Diabetic polyneuropathy associated with type 2 diabetes mellitus (HCC)       Foot pain, bilateral          -Examined patient. -Discussed and educated patient on diabetic foot care, especially with  regards to the vascular, neurological and musculoskeletal systems.  -Stressed the importance of good glycemic control and the detriment of not  controlling glucose levels in relation to the foot. -Mechanically debrided all nails 1-5 bilateral using sterile nail nipper and filed with dremel without incident  -Advised patient to continue stretching to prevent re-occurence of fasciitis -Answered all patient questions -Patient to return  in 3 months for at risk foot care -Patient advised to call the office if any problems or questions arise in the meantime.  Landis Martins, DPM

## 2016-08-07 ENCOUNTER — Other Ambulatory Visit: Payer: Self-pay | Admitting: Gastroenterology

## 2016-09-04 DIAGNOSIS — H4312 Vitreous hemorrhage, left eye: Secondary | ICD-10-CM | POA: Diagnosis not present

## 2016-09-04 DIAGNOSIS — E113511 Type 2 diabetes mellitus with proliferative diabetic retinopathy with macular edema, right eye: Secondary | ICD-10-CM | POA: Diagnosis not present

## 2016-09-04 DIAGNOSIS — H35372 Puckering of macula, left eye: Secondary | ICD-10-CM | POA: Diagnosis not present

## 2016-09-04 DIAGNOSIS — E113592 Type 2 diabetes mellitus with proliferative diabetic retinopathy without macular edema, left eye: Secondary | ICD-10-CM | POA: Diagnosis not present

## 2016-09-05 ENCOUNTER — Ambulatory Visit (INDEPENDENT_AMBULATORY_CARE_PROVIDER_SITE_OTHER): Payer: Medicare Other | Admitting: Gastroenterology

## 2016-09-05 ENCOUNTER — Encounter: Payer: Self-pay | Admitting: Gastroenterology

## 2016-09-05 DIAGNOSIS — K3184 Gastroparesis: Secondary | ICD-10-CM | POA: Diagnosis not present

## 2016-09-05 DIAGNOSIS — K219 Gastro-esophageal reflux disease without esophagitis: Secondary | ICD-10-CM

## 2016-09-05 DIAGNOSIS — K5903 Drug induced constipation: Secondary | ICD-10-CM

## 2016-09-05 MED ORDER — LINACLOTIDE 145 MCG PO CAPS: ORAL_CAPSULE | ORAL | 11 refills | Status: DC

## 2016-09-05 NOTE — Progress Notes (Signed)
Subjective:    Patient ID: Ashley Armstrong, female    DOB: [birth year], 50 y.o.   MRN: [id number]  Ashley Nakayama, MD  HPI SEEMS LIKE SUGAR RUNNING LOW MAKES HER HAVE A BURNING ON LEFT SIDE AROUND TO BACK. BOWLES MOVING: 1X/DAY. [place] 2 DAYS. LAST DOSE LINZESS 290 YESTERDAY.  LOTS OF NAUSEA IN PAST 2 WEEKS. TAKING ZOFRAN AND IT HELPS. LEFT SIDED ABDOMINAL DISCOMFORT RESOLVED BETTER AFTER CONSTIPATION RESOLVED. NO PROBLEMS WITH SEDATION AND HAS NO RECALL OF TCS OR POSTOP PERIOD. HEARTBURN: BETTER. SWALLOWING OK.  DISABILITY CAME THROUGH.  PT DENIES FEVER, CHILLS, HEMATOCHEZIA, vomiting, melena, CHEST PAIN, SHORTNESS OF BREATH, CHANGE IN BOWEL IN HABITS, constipation, problems swallowing, problems with sedation, heartburn or indigestion.  Past Medical History:  Diagnosis Date  . Anxiety   . Dyslipidemia   . Gastroparesis due to DM ([place]) OCT 2014   75% AT 2 HRS, GLU >   . Headache(784.0)   . IDDM (insulin dependent diabetes mellitus) ([place])   . Neuropathy ([place])   . Nicotine addiction   . TIA (transient ischemic attack)    Dr. Merlene Laughter    Past Surgical History:  Procedure Laterality Date  . COLONOSCOPY N/A 05/09/2016   Procedure: COLONOSCOPY;  Surgeon: Danie Binder, MD;  Location: [facility];  Service: Endoscopy;  Laterality: N/A;  130  . ESOPHAGOGASTRODUODENOSCOPY N/A 08/31/2013   SLF: 1. [place] satiety nausea may be due to Gastroparesis/pyloric channel stenosis. 2. small hiatal hernia 3. Moderate erosive gastritis.   Marland Kitchen laser surgery bilateral eye      No Known Allergies  Current Outpatient Prescriptions  Medication Sig Dispense Refill  . ACCU-CHEK FASTCLIX LANCETS MISC Four times daily testing dx e11.65    . diazepam (VALIUM) 10 MG tablet Take 10 mg by mouth daily as needed for anxiety.     . DULoxetine (CYMBALTA) 60 MG capsule Take 1 capsule (60 mg total) by mouth daily.    Marland Kitchen glucose blood (ACCU-CHEK AVIVA) test strip Use as instructed four  times daily testing E11.65    . insulin aspart (NOVOLOG FLEXPEN) 100 UNIT/ML FlexPen TAKE 10 TO 20 UNITS 3 TIMES A DAY WITH MEALS    . Insulin Glargine (LANTUS SOLOSTAR) 100 UNIT/ML Solostar Pen Inject 70 Units into the skin daily.    . Insulin Pen Needle 32G X 4 MM MISC For use with insulin four times daily DX E11.29    . linaclotide (LINZESS) 290 MCG CAPS capsule 1 PO 30 mins prior to your first meal (Patient taking differently: Take 290 mcg by mouth daily. 1 PO 30 mins prior to your first meal)    . lisinopril (PRINIVIL,ZESTRIL) 2.5 MG tablet TAKE ONE TABLET ONCE DAILY    . mometasone (NASONEX) 50 MCG/ACT nasal spray Place 2 sprays into the nose daily. (Patient taking differently: Place 2 sprays into the nose daily as needed (Allergies). )    . montelukast (SINGULAIR) 10 MG tablet TAKE ONE TABLET BY MOUTH EVERY NIGHT AT BEDTIME    . omeprazole (PRILOSEC) 40 MG capsule TAKE ONE CAPSULE BY MOUTH 30 MINUTES PRIOR TO BREAKFAST AND SUPPER    . ondansetron (ZOFRAN) 4 MG tablet Take 1 tablet (4 mg total) by mouth 2 (two) times daily as needed for nausea or vomiting.    . ondansetron (ZOFRAN) 4 MG tablet TAKE ONE TABLET BY MOUTH EVERY EIGHT HOURS AS NEEDED FOR NAUSEA OR VOMITING    . ondansetron (ZOFRAN) 4 MG tablet TAKE ONE TABLET  BY MOUTH EVERY EIGHT HOURS AS NEEDED FOR NAUSEA OR VOMITING    . pravastatin (PRAVACHOL) 80 MG tablet TAKE ONE TABLET BY MOUTH IN THE EVENING.     Review of Systems PER HPI OTHERWISE ALL SYSTEMS ARE NEGATIVE.    Objective:   Physical Exam  Constitutional: She is oriented to person, place, and time. She appears well-developed and well-nourished. No distress.  HENT:  Head: Normocephalic and atraumatic.  Mouth/Throat: Oropharynx is clear and moist. No oropharyngeal exudate.  Eyes: Pupils are equal, round, and reactive to light. No scleral icterus.  Neck: Normal range of motion. Neck supple.  Cardiovascular: Normal rate, regular rhythm and normal heart sounds.     Pulmonary/Chest: Effort normal and breath sounds normal. No respiratory distress.  Abdominal: Soft. Bowel sounds are normal. She exhibits no distension. There is no tenderness.  Musculoskeletal: She exhibits no edema.  BRACE ON LEFT FOREARM.  Lymphadenopathy:    She has no cervical adenopathy.  Neurological: She is alert and oriented to person, place, and time.  NO  NEW FOCAL DEFICITS  Psychiatric: She has a normal mood and affect.  Vitals reviewed.     Assessment & Plan:

## 2016-09-05 NOTE — Assessment & Plan Note (Signed)
SYMPTOMS NOT IDEALLY CONTROLLED AND LIKLEY DUE TO HYPERMOTILE GUT FROMLINZESS.  ZOFRAN AS NEEDED. FOLLOW GASTROPARESIS DIET. FOLLOW UP IN 6 MOS.

## 2016-09-05 NOTE — Progress Notes (Signed)
ON RECALL  °

## 2016-09-05 NOTE — Assessment & Plan Note (Signed)
SYMPTOMS CONTROLLED/RESOLVED ON PRILOSEC.  Use Prilosec 30 minutes prior to your first AND LAST MEAL.

## 2016-09-05 NOTE — Progress Notes (Signed)
CC'ED TO PCP 

## 2016-09-05 NOTE — Assessment & Plan Note (Signed)
SYMPTOMS NOT IDEALLY CONTROLLED. LINZESS [place].  DRINK WATER EAT FIBER REDUCE LINZESS TO 145 MCG QAM FOLLOW UP IN 6 MOS.  PLEASE CALL WITH QUESTIONS OR CONCERNS.

## 2016-09-05 NOTE — Patient Instructions (Signed)
DRINK WATER TO KEEP YOUR URINE LIGHT YELLOW.  USE FIBER POWDER 1 PACKET ONCE OR TWICE DAILY.  CHANGE LINZESS TO 145 MCG EVERY MORNING. USE MILK OF MAGNESIA PILLS OR LIQUID IF NEEDED UP TO THREE TIMES A DAY TO REDUCE CONSTIPATION.  FOLLOW A GASTROPARESIS DIET.  CONTINUE OMEPRAZOLE.  TAKE 30 MINUTES PRIOR TO YOUR MEALS TWICE DAILY.  FOLLOW UP IN 6 MOS. MERRY CHRISTMAS AND HAPPY NEW YEAR!

## 2016-09-10 ENCOUNTER — Other Ambulatory Visit: Payer: Self-pay | Admitting: Family Medicine

## 2016-09-12 ENCOUNTER — Ambulatory Visit: Payer: Medicaid Other | Admitting: Family Medicine

## 2016-09-12 DIAGNOSIS — F410 Panic disorder [episodic paroxysmal anxiety] without agoraphobia: Secondary | ICD-10-CM | POA: Diagnosis not present

## 2016-09-23 ENCOUNTER — Encounter: Payer: Self-pay | Admitting: Family Medicine

## 2016-09-23 ENCOUNTER — Ambulatory Visit (INDEPENDENT_AMBULATORY_CARE_PROVIDER_SITE_OTHER): Payer: Medicaid Other | Admitting: Family Medicine

## 2016-09-23 VITALS — BP 116/74 | HR 91 | Resp 15 | Ht 68.0 in | Wt 125.0 lb

## 2016-09-23 DIAGNOSIS — E1065 Type 1 diabetes mellitus with hyperglycemia: Secondary | ICD-10-CM | POA: Diagnosis not present

## 2016-09-23 DIAGNOSIS — E785 Hyperlipidemia, unspecified: Secondary | ICD-10-CM | POA: Diagnosis not present

## 2016-09-23 DIAGNOSIS — E1029 Type 1 diabetes mellitus with other diabetic kidney complication: Secondary | ICD-10-CM | POA: Diagnosis not present

## 2016-09-23 DIAGNOSIS — R809 Proteinuria, unspecified: Secondary | ICD-10-CM | POA: Diagnosis not present

## 2016-09-23 DIAGNOSIS — M25511 Pain in right shoulder: Secondary | ICD-10-CM | POA: Diagnosis not present

## 2016-09-23 DIAGNOSIS — F411 Generalized anxiety disorder: Secondary | ICD-10-CM

## 2016-09-23 DIAGNOSIS — Z01419 Encounter for gynecological examination (general) (routine) without abnormal findings: Secondary | ICD-10-CM

## 2016-09-23 DIAGNOSIS — F418 Other specified anxiety disorders: Secondary | ICD-10-CM

## 2016-09-23 DIAGNOSIS — Z23 Encounter for immunization: Secondary | ICD-10-CM

## 2016-09-23 DIAGNOSIS — E559 Vitamin D deficiency, unspecified: Secondary | ICD-10-CM

## 2016-09-23 DIAGNOSIS — IMO0002 Reserved for concepts with insufficient information to code with codable children: Secondary | ICD-10-CM

## 2016-09-23 DIAGNOSIS — G8929 Other chronic pain: Secondary | ICD-10-CM

## 2016-09-23 LAB — BASIC METABOLIC PANEL WITH GFR
BUN: 19 mg/dL (ref 7–25)
CO2: 28 mmol/L (ref 20–31)
CREATININE: 1.08 mg/dL (ref 0.50–1.10)
Calcium: 9.5 mg/dL (ref 8.6–10.2)
Chloride: 101 mmol/L (ref 98–110)
GFR, EST AFRICAN AMERICAN: 70 mL/min (ref >=60)
GFR, Est Non African American: 60 mL/min (ref >=60)
GLUCOSE: 195 mg/dL — AB (ref 65–99)
POTASSIUM: 4.4 mmol/L (ref 3.5–5.3)
Sodium: 137 mmol/L (ref 135–146)

## 2016-09-23 LAB — HEMOGLOBIN A1C
HEMOGLOBIN A1C: 9 % — AB (ref <5.7)
MEAN PLASMA GLUCOSE: 212 mg/dL

## 2016-09-23 LAB — TSH: TSH: 2.08 m[IU]/L

## 2016-09-23 NOTE — Progress Notes (Signed)
   Ashley Armstrong     MRN: [id number]      DOB: June 03, 1966   HPI Ashley Armstrong is here for follow up and re-evaluation of chronic medical conditions, medication management and review of any available recent lab and radiology data.  Preventive health is updated, specifically  Cancer screening and Immunization.   Questions or concerns regarding consultations or procedures which the PT has had in the interim are  addressed. The PT denies any adverse reactions to current medications since the last visit.  C/o right arm and shoulder pain limiting mobility Tests 4 times daily and blood sugar levels range from 130 to 250 on average Denies polyuria, polydipsia, blurred vision , or hypoglycemic episodes.   ROS Denies recent fever or chills. Denies sinus pressure, nasal congestion, ear pain or sore throat. Denies chest congestion, productive cough or wheezing. Denies chest pains, palpitations and leg swelling Denies abdominal pain, nausea, vomiting,diarrhea or constipation.   Denies dysuria, frequency, hesitancy or incontinence.  Denies headaches, seizures, numbness, or tingling. Denies depression, anxiety or insomnia. Denies skin break down or rash.   PE  BP 116/74   Pulse 91   Resp 15   Ht 5\' 8"  (1.727 m)   Wt 125 lb (56.7 kg)   LMP 07/19/2013   SpO2 100%   BMI 19.01 kg/m   Patient alert and oriented and in no cardiopulmonary distress.  HEENT: No facial asymmetry, EOMI,   oropharynx pink and moist.  Neck supple no JVD, no mass.  Chest: Clear to auscultation bilaterally.  CVS: S1, S2 no murmurs, no S3.Regular rate.  ABD: Soft non tender.   Ext: No edema  MS: Adequate ROM spine,  hips and knees.Mildly decreased ROM right shoulder and RUE  Skin: Intact, no ulcerations or rash noted.  Psych: Good eye contact, normal affect. Memory intact not anxious or depressed appearing.  CNS: CN 2-12 intact, power,  normal throughout.no focal deficits noted.   Assessment &  Plan  Diabetes mellitus, insulin dependent (IDDM), uncontrolled Updated lab needed at/ before next visit.   Shoulder pain, right Rated at 8 , wants ortho re eval, will refer to Ortho of choice  Hyperlipidemia LDL goal <100 Hyperlipidemia:Low fat diet discussed and encouraged.   Lipid Panel  Lab Results  Component Value Date   CHOL 174 05/21/2016   HDL 61 05/21/2016   LDLCALC 90 05/21/2016   TRIG 115 05/21/2016   CHOLHDL 2.9 05/21/2016   Controlled, no change in medication     Generalized anxiety disorder Controlled, no change in medication   Depression with anxiety Controlled, no change in medication

## 2016-09-23 NOTE — Assessment & Plan Note (Signed)
Controlled, no change in medication  

## 2016-09-23 NOTE — Assessment & Plan Note (Signed)
Updated lab needed at/ before next visit.   

## 2016-09-23 NOTE — Patient Instructions (Signed)
F/U in 3.5 month, call if you need me before  Labs today,  Flu vaccine and foot exam today  You are referred for gyne exam and also to see Dr Mardelle Matte  Thank you  for choosing [facility]. We consider it a privelige to serve you.  Delivering excellent health care in a caring and  compassionate way is our goal.  Partnering with you,  so that together we can achieve this goal is our strategy.

## 2016-09-23 NOTE — Assessment & Plan Note (Addendum)
Rated at 8 , wants ortho re eval, will refer to Ortho of choice

## 2016-09-23 NOTE — Assessment & Plan Note (Signed)
Hyperlipidemia:Low fat diet discussed and encouraged.   Lipid Panel  Lab Results  Component Value Date   CHOL 174 05/21/2016   HDL 61 05/21/2016   LDLCALC 90 05/21/2016   TRIG 115 05/21/2016   CHOLHDL 2.9 05/21/2016   Controlled, no change in medication

## 2016-09-24 LAB — MICROALBUMIN / CREATININE URINE RATIO
CREATININE, URINE: 108 mg/dL (ref 20–320)
MICROALB UR: 14.7 mg/dL
Microalb Creat Ratio: 136 mcg/mg creat — ABNORMAL HIGH (ref <30)

## 2016-09-27 ENCOUNTER — Other Ambulatory Visit: Payer: Self-pay | Admitting: Family Medicine

## 2016-10-01 ENCOUNTER — Telehealth: Payer: Self-pay

## 2016-10-01 DIAGNOSIS — IMO0002 Reserved for concepts with insufficient information to code with codable children: Secondary | ICD-10-CM

## 2016-10-01 DIAGNOSIS — E1165 Type 2 diabetes mellitus with hyperglycemia: Secondary | ICD-10-CM

## 2016-10-01 DIAGNOSIS — E118 Type 2 diabetes mellitus with unspecified complications: Secondary | ICD-10-CM

## 2016-10-01 DIAGNOSIS — E785 Hyperlipidemia, unspecified: Secondary | ICD-10-CM

## 2016-10-01 DIAGNOSIS — E1122 Type 2 diabetes mellitus with diabetic chronic kidney disease: Secondary | ICD-10-CM | POA: Insufficient documentation

## 2016-10-01 DIAGNOSIS — N182 Chronic kidney disease, stage 2 (mild): Secondary | ICD-10-CM

## 2016-10-01 DIAGNOSIS — Z794 Long term (current) use of insulin: Secondary | ICD-10-CM

## 2016-10-01 NOTE — Telephone Encounter (Signed)
Aware and lab order mailed

## 2016-11-05 ENCOUNTER — Emergency Department (HOSPITAL_COMMUNITY): Payer: PPO

## 2016-11-05 ENCOUNTER — Encounter (HOSPITAL_COMMUNITY): Payer: Self-pay | Admitting: Emergency Medicine

## 2016-11-05 ENCOUNTER — Emergency Department (HOSPITAL_COMMUNITY)
Admission: EM | Admit: 2016-11-05 | Discharge: 2016-11-05 | Disposition: A | Discharge status: Home / Self Care | Payer: PPO | Attending: Emergency Medicine | Admitting: Emergency Medicine

## 2016-11-05 DIAGNOSIS — Y939 Activity, unspecified: Secondary | ICD-10-CM | POA: Diagnosis not present

## 2016-11-05 DIAGNOSIS — Z87891 Personal history of nicotine dependence: Secondary | ICD-10-CM | POA: Insufficient documentation

## 2016-11-05 DIAGNOSIS — S9031XA Contusion of right foot, initial encounter: Secondary | ICD-10-CM | POA: Diagnosis not present

## 2016-11-05 DIAGNOSIS — W228XXA Striking against or struck by other objects, initial encounter: Secondary | ICD-10-CM | POA: Diagnosis not present

## 2016-11-05 DIAGNOSIS — Y929 Unspecified place or not applicable: Secondary | ICD-10-CM | POA: Insufficient documentation

## 2016-11-05 DIAGNOSIS — M79671 Pain in right foot: Secondary | ICD-10-CM | POA: Diagnosis not present

## 2016-11-05 DIAGNOSIS — Z794 Long term (current) use of insulin: Secondary | ICD-10-CM | POA: Insufficient documentation

## 2016-11-05 DIAGNOSIS — Y999 Unspecified external cause status: Secondary | ICD-10-CM | POA: Diagnosis not present

## 2016-11-05 DIAGNOSIS — E119 Type 2 diabetes mellitus without complications: Secondary | ICD-10-CM | POA: Insufficient documentation

## 2016-11-05 DIAGNOSIS — Z79899 Other long term (current) drug therapy: Secondary | ICD-10-CM | POA: Insufficient documentation

## 2016-11-05 MED ORDER — HYDROCODONE-ACETAMINOPHEN 5-325 MG PO TABS: 2.0000 | ORAL_TABLET | ORAL | 0 refills | Status: DC | PRN

## 2016-11-05 NOTE — ED Provider Notes (Signed)
[facility] DEPT Provider Note   CSN: [id number] Arrival date & time: 11/05/16  1620     History   Chief Complaint Chief Complaint  Patient presents with  . Foot Pain    HPI Ashley Armstrong is a 50 y.o. female.  The history is provided by the patient.  Foot Pain  This is a new problem. The current episode started 12 to 24 hours ago (she kicked a garbage can yesterday and since has had foot pain along with swelling and bruising.). The problem occurs constantly. The problem has not changed since onset.The symptoms are aggravated by walking and standing. The symptoms are relieved by rest and ice. She has tried a cold compress for the symptoms.    Past Medical History:  Diagnosis Date  . Anxiety   . Dyslipidemia   . Gastroparesis due to DM ([place]) OCT 2014   75% AT 2 HRS, GLU >   . Headache(784.0)   . IDDM (insulin dependent diabetes mellitus) ([place])   . Neuropathy ([place])   . Nicotine addiction   . TIA (transient ischemic attack)    Dr. Merlene Laughter    Patient Active Problem List   Diagnosis Date Noted  . Uncontrolled diabetes mellitus ([place]) 10/01/2016  . Shoulder pain, right 09/23/2016  . GERD (gastroesophageal reflux disease) 09/05/2016  . Constipation 10/12/2015  . Seasonal allergies 11/09/2014  . Depression with anxiety 06/18/2014  . Tight fascia 01/17/2014  . Neck pain 12/07/2013  . Degenerative disc disease, cervical 12/07/2013  . Thoracic back pain 07/31/2013  . CTS (carpal tunnel syndrome) 07/08/2013  . Polyneuropathy ([place]) 07/08/2013  . Diabetic nephropathy ([place]) 06/23/2013  . TIA (transient ischemic attack) 04/28/2013  . Tennis elbow syndrome 04/15/2013  . Rotator cuff syndrome of left shoulder 04/15/2013  . Vitamin D deficiency 03/20/2013  . Onychomycosis of toenail 03/20/2013  . Left arm pain 03/19/2013  . HEADACHE 11/30/2008  . DIABETES MELLITUS, WITH RENAL COMPLICATIONS 123XX123  . Chronic fatigue 10/24/2008  . Gastroparesis 06/16/2008  . Diabetes  mellitus, insulin dependent (IDDM), uncontrolled ([place]) 04/13/2008  . Hyperlipidemia LDL goal <100 04/13/2008  . Generalized anxiety disorder 04/13/2008    Past Surgical History:  Procedure Laterality Date  . COLONOSCOPY N/A 05/09/2016   Procedure: COLONOSCOPY;  Surgeon: Danie Binder, MD;  Location: [facility] SUITE;  Service: Endoscopy;  Laterality: N/A;  130  . ESOPHAGOGASTRODUODENOSCOPY N/A 08/31/2013   SLF: 1. [place]y satiety nausea may be due to Gastroparesis/pyloric channel stenosis. 2. small hiatal hernia 3. Moderate erosive gastritis.   Marland Kitchen laser surgery bilateral eye      OB History    No data available       Home Medications    Prior to Admission medications   Medication Sig Start Date End Date Taking? Authorizing Provider  ACCU-CHEK SOFTCLIX LANCETS lancets USE TO TEST 4 TIMES DAILY. 09/27/16   Fayrene Helper, MD  diazepam (VALIUM) 10 MG tablet Take 10 mg by mouth daily as needed for anxiety.     Historical Provider, MD  DULoxetine (CYMBALTA) 60 MG capsule Take 1 capsule (60 mg total) by mouth daily. 05/23/16   Fayrene Helper, MD  glucose blood (ACCU-CHEK AVIVA) test strip Use as instructed four times daily testing E11.65 01/26/16   Fayrene Helper, MD  HYDROcodone-acetaminophen (NORCO/VICODIN) 5-325 MG tablet Take 2 tablets by mouth every 4 (four) hours as needed. 11/05/16   Evalee Jefferson, PA-C  Insulin Glargine (LANTUS SOLOSTAR) 100 UNIT/ML Solostar Pen Inject 70 Units into the skin  daily. 05/23/16   Fayrene Helper, MD  Insulin Pen Needle 32G X 4 MM MISC For use with insulin four times daily DX E11.29 06/18/16   Fayrene Helper, MD  linaclotide [facility]) 145 MCG CAPS capsule 1 PO 30 mins WITH your first meal 09/05/16   Danie Binder, MD  lisinopril (PRINIVIL,ZESTRIL) 2.5 MG tablet TAKE ONE TABLET ONCE DAILY 05/06/16   Fayrene Helper, MD  mometasone (NASONEX) 50 MCG/ACT nasal spray Place 2 sprays into the nose daily. Patient taking differently: Place 2 sprays into  the nose daily as needed (Allergies).  11/09/14   Fayrene Helper, MD  montelukast (SINGULAIR) 10 MG tablet TAKE ONE TABLET BY MOUTH EVERY NIGHT AT BEDTIME 10/16/15   Fayrene Helper, MD  NOVOLOG FLEXPEN 100 UNIT/ML FlexPen INJECT 10 TO 20 UNITS THREE TIMES A DAY WITH MEALS 09/10/16   Fayrene Helper, MD  omeprazole (PRILOSEC) 40 MG capsule TAKE ONE CAPSULE BY MOUTH 30 MINUTES PRIOR TO BREAKFAST AND SUPPER 08/08/16   Annitta Needs, NP  ondansetron (ZOFRAN) 4 MG tablet TAKE ONE TABLET BY MOUTH EVERY EIGHT HOURS AS NEEDED FOR NAUSEA OR VOMITING 06/17/16   Carlis Stable, NP  ondansetron (ZOFRAN) 4 MG tablet TAKE ONE TABLET BY MOUTH EVERY EIGHT HOURS AS NEEDED FOR NAUSEA OR VOMITING 08/08/16   Annitta Needs, NP  pravastatin (PRAVACHOL) 80 MG tablet TAKE ONE TABLET BY MOUTH IN THE EVENING. 04/16/16   Fayrene Helper, MD    Family History Family History  Problem Relation Age of Onset  . Hypertension Mother   . Diabetes Mother   . Hyperlipidemia Mother   . Rosacea Mother   . Hypertension Father   . Colon cancer Neg Hx     Social History Social History  Substance Use Topics  . Smoking status: Former Research scientist (life sciences)  . Smokeless tobacco: Never Used     Comment: August 25, 2011 QUIT.   Marland Kitchen Alcohol use No     Allergies   Patient has no known allergies.   Review of Systems Review of Systems  Constitutional: Negative for fever.  Musculoskeletal: Positive for arthralgias and joint swelling. Negative for myalgias.  Skin: Positive for color change. Negative for wound.  Neurological: Negative for weakness and numbness.     Physical Exam Updated Vital Signs BP 160/85 (BP Location: Left Arm)   Pulse 93   Temp 97.7 F (36.5 C) (Oral)   Resp 16   Ht 5\' 8"  (1.727 m)   Wt 56.2 kg   LMP 07/19/2013   SpO2 99%   BMI 18.85 kg/m   Physical Exam  Constitutional: She appears well-developed and well-nourished.  HENT:  Head: Atraumatic.  Neck: Normal range of motion.  Cardiovascular:  Pulses  equal bilaterally  Musculoskeletal: She exhibits tenderness.       Right foot: There is decreased range of motion, bony tenderness and swelling. There is normal capillary refill, no crepitus, no deformity and no laceration.       Feet:  Moderate edema and bruising noted dorsal foot extending into the 2nd toe.  Distal sensation intact.  Neurological: She is alert. She has normal strength. She displays normal reflexes. No sensory deficit.  Skin: Skin is warm and dry.  Psychiatric: She has a normal mood and affect.     ED Treatments / Results  Labs (all labs ordered are listed, but only abnormal results are displayed) Labs Reviewed - No data to display  EKG  EKG Interpretation None  Radiology Dg Foot Complete Right  Result Date: 11/05/2016 CLINICAL DATA:  Pain, swelling, bruising, and erythema in the right foot after kicking a trash can yesterday. Diabetes. EXAM: RIGHT FOOT COMPLETE - 3+ VIEW COMPARISON:  01/12/2015 FINDINGS: There is no evidence of fracture or dislocation. There is no evidence of arthropathy or other focal bone abnormality. Soft tissues are unremarkable. IMPRESSION: Negative. Electronically Signed   By: Van Clines M.D.   On: 11/05/2016 17:49    Procedures Procedures (including critical care time)  Medications Ordered in ED Medications - No data to display   Initial Impression / Assessment and Plan / ED Course  I have reviewed the triage vital signs and the nursing notes.  Pertinent labs & imaging results that were available during my care of the patient were reviewed by me and considered in my medical decision making (see chart for details).  Clinical Course     Pt presents with a post op shoe from prior foot injury. Encouraged use.  Ice, elevation, ibuprofen. Few hydrocodone prescribed.  [place] controlled substance database reviewed.   Final Clinical Impressions(s) / ED Diagnoses   Final diagnoses:  Contusion of right foot, initial  encounter    New Prescriptions Discharge Medication List as of 11/05/2016  7:25 PM    START taking these medications   Details  HYDROcodone-acetaminophen (NORCO/VICODIN) 5-325 MG tablet Take 2 tablets by mouth every 4 (four) hours as needed., Starting Tue 11/05/2016, Print         Evalee Jefferson, PA-C 11/06/16 Deputy, MD 11/07/16 [phone]

## 2016-11-05 NOTE — ED Triage Notes (Signed)
Pt states she got mad yesterday and kicked a trash can. Pt has bruising and edema to her R foot.

## 2016-11-05 NOTE — Discharge Instructions (Signed)
You may take the hydrocodone prescribed for pain relief.  This will make you drowsy - do not drive within 4 hours of taking this medication.  Ice and elevation may help with pain.  Wear your postop shoe as needed for pain.  X-rays are negative for acute fracture.

## 2016-11-12 ENCOUNTER — Ambulatory Visit (INDEPENDENT_AMBULATORY_CARE_PROVIDER_SITE_OTHER): Payer: PPO | Admitting: Sports Medicine

## 2016-11-12 DIAGNOSIS — M79672 Pain in left foot: Secondary | ICD-10-CM | POA: Diagnosis not present

## 2016-11-12 DIAGNOSIS — M79671 Pain in right foot: Secondary | ICD-10-CM | POA: Diagnosis not present

## 2016-11-12 DIAGNOSIS — E1142 Type 2 diabetes mellitus with diabetic polyneuropathy: Secondary | ICD-10-CM

## 2016-11-12 DIAGNOSIS — B351 Tinea unguium: Secondary | ICD-10-CM | POA: Diagnosis not present

## 2016-11-12 DIAGNOSIS — M7989 Other specified soft tissue disorders: Secondary | ICD-10-CM

## 2016-11-12 NOTE — Progress Notes (Signed)
Patient ID: Ashley Armstrong, female   DOB: 09-30-1966, 51 y.o.   MRN: [id number]   Subjective: Ashley Armstrong is a 51 y.o. female patient with history of diabetes who presents to office today complaining of long, painful nails  while ambulating in shoes; unable to trim. Patient states that the glucose reading this morning was not recorded. Reports hit toe on trash can and went to ER on 11-05-16 had xray without fracture but toe is still swollen. Patient denies any new changes in medication or other new problems.  Patient Active Problem List   Diagnosis Date Noted  . Uncontrolled diabetes mellitus ([place]) 10/01/2016  . Shoulder pain, right 09/23/2016  . GERD (gastroesophageal reflux disease) 09/05/2016  . Constipation 10/12/2015  . Seasonal allergies 11/09/2014  . Depression with anxiety 06/18/2014  . Tight fascia 01/17/2014  . Neck pain 12/07/2013  . Degenerative disc disease, cervical 12/07/2013  . Thoracic back pain 07/31/2013  . CTS (carpal tunnel syndrome) 07/08/2013  . Polyneuropathy ([place]) 07/08/2013  . Diabetic nephropathy ([place]) 06/23/2013  . TIA (transient ischemic attack) 04/28/2013  . Tennis elbow syndrome 04/15/2013  . Rotator cuff syndrome of left shoulder 04/15/2013  . Vitamin D deficiency 03/20/2013  . Onychomycosis of toenail 03/20/2013  . Left arm pain 03/19/2013  . HEADACHE 11/30/2008  . DIABETES MELLITUS, WITH RENAL COMPLICATIONS 123XX123  . Chronic fatigue 10/24/2008  . Gastroparesis 06/16/2008  . Diabetes mellitus, insulin dependent (IDDM), uncontrolled ([place]) 04/13/2008  . Hyperlipidemia LDL goal <100 04/13/2008  . Generalized anxiety disorder 04/13/2008   Current Outpatient Prescriptions on File Prior to Visit  Medication Sig Dispense Refill  . ACCU-CHEK SOFTCLIX LANCETS lancets USE TO TEST 4 TIMES DAILY. 100 each 5  . diazepam (VALIUM) 10 MG tablet Take 10 mg by mouth daily as needed for anxiety.     . DULoxetine (CYMBALTA) 60 MG capsule Take 1 capsule (60 mg  total) by mouth daily. 30 capsule 4  . glucose blood (ACCU-CHEK AVIVA) test strip Use as instructed four times daily testing E11.65 200 each 5  . HYDROcodone-acetaminophen (NORCO/VICODIN) 5-325 MG tablet Take 2 tablets by mouth every 4 (four) hours as needed. 6 tablet 0  . Insulin Glargine (LANTUS SOLOSTAR) 100 UNIT/ML Solostar Pen Inject 70 Units into the skin daily. 15 mL 11  . Insulin Pen Needle 32G X 4 MM MISC For use with insulin four times daily DX E11.29 150 each 5  . linaclotide (LINZESS) 145 MCG CAPS capsule 1 PO 30 mins WITH your first meal 30 capsule 11  . lisinopril (PRINIVIL,ZESTRIL) 2.5 MG tablet TAKE ONE TABLET ONCE DAILY 90 tablet 1  . mometasone (NASONEX) 50 MCG/ACT nasal spray Place 2 sprays into the nose daily. (Patient taking differently: Place 2 sprays into the nose daily as needed (Allergies). ) 17 g 12  . montelukast (SINGULAIR) 10 MG tablet TAKE ONE TABLET BY MOUTH EVERY NIGHT AT BEDTIME 30 tablet 3  . NOVOLOG FLEXPEN 100 UNIT/ML FlexPen INJECT 10 TO 20 UNITS THREE TIMES A DAY WITH MEALS 15 mL 5  . omeprazole (PRILOSEC) 40 MG capsule TAKE ONE CAPSULE BY MOUTH 30 MINUTES PRIOR TO BREAKFAST AND SUPPER 180 capsule 3  . ondansetron (ZOFRAN) 4 MG tablet TAKE ONE TABLET BY MOUTH EVERY EIGHT HOURS AS NEEDED FOR NAUSEA OR VOMITING 60 tablet 1  . ondansetron (ZOFRAN) 4 MG tablet TAKE ONE TABLET BY MOUTH EVERY EIGHT HOURS AS NEEDED FOR NAUSEA OR VOMITING 60 tablet 1  . pravastatin (PRAVACHOL) 80 MG tablet TAKE ONE  TABLET BY MOUTH IN THE EVENING. 30 tablet 3   No current facility-administered medications on file prior to visit.    No Known Allergies  Recent Results (from the past 2160 hour(s))  TSH     Status: None   Collection Time: 09/23/16 10:08 AM  Result Value Ref Range   TSH 2.08 mIU/L    Comment:   Reference Range   > or = 20 Years  0.40-4.50   Pregnancy Range First trimester  0.26-2.66 Second trimester 0.55-2.73 Third trimester  0.43-2.91     Microalbumin /  creatinine urine ratio     Status: Abnormal   Collection Time: 09/23/16 10:08 AM  Result Value Ref Range   Creatinine, Urine 108 20 - 320 mg/dL   Microalb, Ur 14.7 Not estab mg/dL   Microalb Creat Ratio 136 (H) <30 mcg/mg creat    Comment: The ADA has defined abnormalities in albumin excretion as follows:           Category           Result                            (mcg/mg creatinine)                 Normal:    <30       Microalbuminuria:    30 - 299   Clinical albuminuria:    > or = 300   The ADA recommends that at least two of three specimens collected within a 3 - 6 month period be abnormal before considering a patient to be within a diagnostic category.     BASIC METABOLIC PANEL WITH GFR     Status: Abnormal   Collection Time: 09/23/16 10:08 AM  Result Value Ref Range   Sodium 137 135 - 146 mmol/L   Potassium 4.4 3.5 - 5.3 mmol/L   Chloride 101 98 - 110 mmol/L   CO2 28 20 - 31 mmol/L   Glucose, Bld 195 (H) 65 - 99 mg/dL   BUN 19 7 - 25 mg/dL   Creat 1.08 0.50 - 1.10 mg/dL   Calcium 9.5 8.6 - 10.2 mg/dL   GFR, Est African American 70 >=60 mL/min   GFR, Est Non African American 60 >=60 mL/min  Hemoglobin A1c     Status: Abnormal   Collection Time: 09/23/16 10:08 AM  Result Value Ref Range   Hgb A1c MFr Bld 9.0 (H) <5.7 %    Comment:   For someone without known diabetes, a hemoglobin A1c value of 6.5% or greater indicates that they may have diabetes and this should be confirmed with a follow-up test.   For someone with known diabetes, a value <7% indicates that their diabetes is well controlled and a value greater than or equal to 7% indicates suboptimal control. A1c targets should be individualized based on duration of diabetes, age, comorbid conditions, and other considerations.   Currently, no consensus exists for use of hemoglobin A1c for diagnosis of diabetes for children.      Mean Plasma Glucose 212 mg/dL    Objective: General: Patient is awake, alert,  and oriented x 3 and in no acute distress.  Integument: Skin is warm, dry and supple bilateral. Nails are tender, long, thickened and dystrophic with subungual debris, consistent with onychomycosis, 1-5 bilateral. No signs of infection. No open lesions or preulcerative lesions present bilateral. + swelling at right 2nd toe. Remaining integument  unremarkable.  Vasculature:  Dorsalis Pedis pulse 1/4 bilateral. Posterior Tibial pulse 1/4 bilateral.  Capillary fill time <3 sec 1-5 bilateral. Decreased hair growth to the level of the digits. Temperature gradient within normal limits. Mild varicosities present bilateral.  Neurology: The patient has absent sensation measured with a 5.07/10g Semmes Weinstein Monofilament at all pedal sites bilateral . Vibratory sensation absent bilateral with tuning fork. No Babinski sign present bilateral.   Musculoskeletal: + Swelling at right 2nd toe with minimal pain. Asymptomatic varus 4-5 hammertoes pedal deformities noted bilateral. No pain to plantar fascia bilateral. Muscular strength 4/5 in all lower extremity muscular groups bilateral without pain on range of motion. No tenderness with calf compression bilateral.  Right foot xray 11-05-16 IMPRESSION: Negative.  Assessment and Plan: Problem List Items Addressed This Visit    None    Visit Diagnoses    Dermatophytosis of nail    -  Primary   Diabetic polyneuropathy associated with type 2 diabetes mellitus (HCC)       Foot pain, bilateral       Swelling of toe of right foot       2nd toe      -Examined patient. -Right foot xrays reviewed -Applied buddy taping to assist with edema control and will xray at next visit to make sure there is not a delayed fracture to toe -Discussed and educated patient on diabetic foot care, especially with regards to the vascular, neurological and musculoskeletal systems.  -Stressed the importance of good glycemic control and the detriment of not controlling glucose  levels in relation to the foot. -Mechanically debrided all nails 1-5 bilateral using sterile nail nipper and filed with dremel without incident  -Answered all patient questions -Patient to return  in 5 weeks for at risk foot care/follow up on right 2nd toe -Patient advised to call the office if any problems or questions arise in the meantime.  Landis Martins, DPM

## 2016-11-13 ENCOUNTER — Other Ambulatory Visit: Payer: Self-pay | Admitting: Family Medicine

## 2016-11-13 MED FILL — Hydrocodone-Acetaminophen Tab 5-325 MG: ORAL | Qty: 6 | Status: AC

## 2016-11-15 ENCOUNTER — Other Ambulatory Visit: Payer: Self-pay

## 2016-11-15 MED ORDER — INSULIN GLARGINE 100 UNIT/ML SOLOSTAR PEN: 80.0000 [IU] | PEN_INJECTOR | Freq: Every day | SUBCUTANEOUS | 11 refills | Status: DC

## 2016-11-25 ENCOUNTER — Other Ambulatory Visit: Payer: Self-pay

## 2016-11-25 ENCOUNTER — Other Ambulatory Visit: Payer: Self-pay | Admitting: Gastroenterology

## 2016-11-25 MED ORDER — INSULIN GLARGINE 100 UNIT/ML SOLOSTAR PEN: 80.0000 [IU] | PEN_INJECTOR | Freq: Every day | SUBCUTANEOUS | 11 refills | Status: DC

## 2016-11-26 DIAGNOSIS — H02839 Dermatochalasis of unspecified eye, unspecified eyelid: Secondary | ICD-10-CM | POA: Diagnosis not present

## 2016-11-26 DIAGNOSIS — H18413 Arcus senilis, bilateral: Secondary | ICD-10-CM | POA: Diagnosis not present

## 2016-11-26 DIAGNOSIS — H35372 Puckering of macula, left eye: Secondary | ICD-10-CM | POA: Diagnosis not present

## 2016-11-26 DIAGNOSIS — E113393 Type 2 diabetes mellitus with moderate nonproliferative diabetic retinopathy without macular edema, bilateral: Secondary | ICD-10-CM | POA: Diagnosis not present

## 2016-11-26 DIAGNOSIS — H2513 Age-related nuclear cataract, bilateral: Secondary | ICD-10-CM | POA: Diagnosis not present

## 2016-11-26 DIAGNOSIS — H2512 Age-related nuclear cataract, left eye: Secondary | ICD-10-CM | POA: Diagnosis not present

## 2016-12-04 ENCOUNTER — Ambulatory Visit: Payer: PPO | Admitting: Family Medicine

## 2016-12-04 ENCOUNTER — Encounter: Payer: Self-pay | Admitting: Family Medicine

## 2016-12-12 DIAGNOSIS — F410 Panic disorder [episodic paroxysmal anxiety] without agoraphobia: Secondary | ICD-10-CM | POA: Diagnosis not present

## 2016-12-13 ENCOUNTER — Encounter: Payer: Self-pay | Admitting: Gastroenterology

## 2016-12-13 ENCOUNTER — Ambulatory Visit (INDEPENDENT_AMBULATORY_CARE_PROVIDER_SITE_OTHER): Payer: PPO | Admitting: Gastroenterology

## 2016-12-13 VITALS — BP 106/72 | HR 86 | Temp 98.0°F | Ht 68.0 in | Wt 125.4 lb

## 2016-12-13 DIAGNOSIS — R140 Abdominal distension (gaseous): Secondary | ICD-10-CM | POA: Diagnosis not present

## 2016-12-13 DIAGNOSIS — K824 Cholesterolosis of gallbladder: Secondary | ICD-10-CM | POA: Insufficient documentation

## 2016-12-13 DIAGNOSIS — R197 Diarrhea, unspecified: Secondary | ICD-10-CM

## 2016-12-13 NOTE — Assessment & Plan Note (Signed)
51 year old female with history of GERD, constipation, gastroparesis who presents with one week history of abdominal distention/bloating without nausea or vomiting, diarrhea. Mother and daughter with vomiting and diarrhea for 2 days. Patient denies any nausea and in fact states she has been eating very well. No postprandial abdominal pain. Doubt we are dealing with bowel obstruction. May be functional versus viral versus gastroparesis. She does have history of gallbladder polyp, will follow-up with abdominal ultrasound. As far as constipation, she does not tolerate Linzess 1105mcg daily.  We will back down to 72 g daily which she will resume after  Current diarrheal illness subsides.

## 2016-12-13 NOTE — Progress Notes (Signed)
Primary Care Physician: [name], MD  Primary Gastroenterologist:  Barney Drain, MD   Chief Complaint  Patient presents with  . Bloated    x1 week, no pain  . Diarrhea    yesterday    HPI: Ashley Armstrong is a 51 y.o. female here for  One-week history of abdominal bloating and  Diarrhea. She was last seen in October. Patient tells me she was doing very well up until one week ago. She started having significant abdominal distention, discomfort into the back/burning quality. No nausea. Eating well. No heartburn. Symptoms different than typical gastroparesis. She was having bowel movement once per day felt complete. Last couple days she's been having diarrhea, 2-3 loose stools daily although this seems to have subsided yesterday. No blood in stool or melena and no dysuria. Held Linzess due to diarrhea.  Does not tolerate 145 g dose because of diarrhea. Takes it about every other day. Her mother and her daughter both have vomiting and diarrhea that lasted for about 2 days recently.   Current Outpatient Prescriptions  Medication Sig Dispense Refill  . ACCU-CHEK SOFTCLIX LANCETS lancets USE TO TEST 4 TIMES DAILY. 100 each 5  . diazepam (VALIUM) 10 MG tablet Take 10 mg by mouth daily as needed for anxiety.     . DULoxetine (CYMBALTA) 60 MG capsule Take 1 capsule (60 mg total) by mouth daily. 30 capsule 4  . glucose blood (ACCU-CHEK AVIVA) test strip Use as instructed four times daily testing E11.65 200 each 5  . Insulin Glargine (LANTUS SOLOSTAR) 100 UNIT/ML Solostar Pen Inject 80 Units into the skin daily. 15 mL 11  . Insulin Pen Needle 32G X 4 MM MISC For use with insulin four times daily DX E11.29 150 each 5  . linaclotide (LINZESS) 145 MCG CAPS capsule 1 PO 30 mins WITH your first meal 30 capsule 11  . lisinopril (PRINIVIL,ZESTRIL) 2.5 MG tablet TAKE ONE (1) TABLET EACH DAY 90 tablet 1  . mometasone (NASONEX) 50 MCG/ACT nasal spray Place 2 sprays into the nose daily. (Patient  taking differently: Place 2 sprays into the nose daily as needed (Allergies). ) 17 g 12  . montelukast (SINGULAIR) 10 MG tablet TAKE ONE TABLET BY MOUTH EVERY NIGHT AT BEDTIME 30 tablet 3  . NOVOLOG FLEXPEN 100 UNIT/ML FlexPen INJECT 10 TO 20 UNITS THREE TIMES A DAY WITH MEALS 15 mL 5  . omeprazole (PRILOSEC) 40 MG capsule TAKE ONE CAPSULE BY MOUTH 30 MINUTES PRIOR TO BREAKFAST AND SUPPER 180 capsule 3  . ondansetron (ZOFRAN) 4 MG tablet TAKE ONE TABLET BY MOUTH EVERY EIGHT HOURS AS NEEDED FOR NAUSEA OR VOMITING 60 tablet 1  . ondansetron (ZOFRAN) 4 MG tablet TAKE ONE TABLET EVERY 8 HOURS AS NEEDED FOR NAUSEA OR VOMITING 60 tablet 3  . pravastatin (PRAVACHOL) 80 MG tablet TAKE 1 TABLET BY MOUTH IN THE EVENING 90 tablet 1   No current facility-administered medications for this visit.     Allergies as of 12/13/2016  . (No Known Allergies)    ROS:  General: Negative for anorexia, weight loss, fever, chills, fatigue, weakness. ENT: Negative for hoarseness, difficulty swallowing , nasal congestion. CV: Negative for chest pain, angina, palpitations, dyspnea on exertion, peripheral edema.  Respiratory: Negative for dyspnea at rest, dyspnea on exertion, cough, sputum, wheezing.  GI: See history of present illness. GU:  Negative for dysuria, hematuria, urinary incontinence, urinary frequency, nocturnal urination.  Endo: Negative for unusual weight change.  Physical Examination:   BP 106/72   Pulse 86   Temp 98 F (36.7 C) (Other (Comment))   Ht 5\' 8"  (1.727 m)   Wt 125 lb 6.4 oz (56.9 kg)   LMP 07/26/2013   BMI 19.07 kg/m   General: Well-nourished, well-developed in no acute distress.  Eyes: No icterus. Mouth: Oropharyngeal mucosa moist and pink , no lesions erythema or exudate. Lungs: Clear to auscultation bilaterally.  Heart: Regular rate and rhythm, no murmurs rubs or gallops.  Abdomen: Bowel sounds are normal,  Moderately distended, epigastric tenderness, no hepatosplenomegaly  or masses, no abdominal bruits or hernia , no rebound or guarding.   Extremities: No lower extremity edema. No clubbing or deformities. Neuro: Alert and oriented x 4   Skin: Warm and dry, no jaundice.   Psych: Alert and cooperative, normal mood and affect.  Labs:  Lab Results  Component Value Date   ALT 17 05/21/2016   AST 17 05/21/2016   ALKPHOS 70 05/21/2016   BILITOT 0.4 05/21/2016   Lab Results  Component Value Date   CREATININE 1.08 09/23/2016   BUN 19 09/23/2016   NA 137 09/23/2016   K 4.4 09/23/2016   CL 101 09/23/2016   CO2 28 09/23/2016   Lab Results  Component Value Date   WBC 6.5 08/07/2015   HGB 12.3 08/07/2015   HCT 36.8 08/07/2015   MCV 84.6 08/07/2015   PLT 256 08/07/2015   Lab Results  Component Value Date   HGBA1C 9.0 (H) 09/23/2016    Imaging Studies: No results found.

## 2016-12-13 NOTE — Patient Instructions (Signed)
1. Abdominal ultrasound as schedule.  2. If your bloating gets worse, you develop abdominal pain, nausea let me know. We would do an abdominal xray and labs. 3. Stop Linzess 175mcg, start 73mcg once daily once your diarrhea resolves. If you tolerate without diarrhea, we can call in RX.

## 2016-12-16 NOTE — Progress Notes (Signed)
CC'ED TO PCP 

## 2016-12-17 ENCOUNTER — Ambulatory Visit (INDEPENDENT_AMBULATORY_CARE_PROVIDER_SITE_OTHER): Payer: PPO | Admitting: Sports Medicine

## 2016-12-17 ENCOUNTER — Encounter: Payer: Self-pay | Admitting: Sports Medicine

## 2016-12-17 ENCOUNTER — Ambulatory Visit (INDEPENDENT_AMBULATORY_CARE_PROVIDER_SITE_OTHER): Payer: PPO

## 2016-12-17 DIAGNOSIS — E1142 Type 2 diabetes mellitus with diabetic polyneuropathy: Secondary | ICD-10-CM | POA: Diagnosis not present

## 2016-12-17 DIAGNOSIS — M7989 Other specified soft tissue disorders: Secondary | ICD-10-CM | POA: Diagnosis not present

## 2016-12-17 DIAGNOSIS — S92501A Displaced unspecified fracture of right lesser toe(s), initial encounter for closed fracture: Secondary | ICD-10-CM

## 2016-12-17 NOTE — Progress Notes (Signed)
Subjective: Ashley Armstrong is a 51 y.o. Diabetic female patient who returns to office for evaluation of Right foot pain patient had xrays in December which was negative but hit foot on trash can with more swelling to already swollen toe from December. Patient denies any other pedal complaints.   Patient Active Problem List   Diagnosis Date Noted  . Bloating 12/13/2016  . Gallbladder polyp 12/13/2016  . Uncontrolled diabetes mellitus ([place]) 10/01/2016  . Shoulder pain, right 09/23/2016  . GERD (gastroesophageal reflux disease) 09/05/2016  . Constipation 10/12/2015  . Seasonal allergies 11/09/2014  . Diarrhea 06/30/2014  . Depression with anxiety 06/18/2014  . Tight fascia 01/17/2014  . Neck pain 12/07/2013  . Degenerative disc disease, cervical 12/07/2013  . Thoracic back pain 07/31/2013  . CTS (carpal tunnel syndrome) 07/08/2013  . Polyneuropathy ([place]) 07/08/2013  . Diabetic nephropathy ([place]) 06/23/2013  . TIA (transient ischemic attack) 04/28/2013  . Tennis elbow syndrome 04/15/2013  . Rotator cuff syndrome of left shoulder 04/15/2013  . Vitamin D deficiency 03/20/2013  . Onychomycosis of toenail 03/20/2013  . Left arm pain 03/19/2013  . HEADACHE 11/30/2008  . DIABETES MELLITUS, WITH RENAL COMPLICATIONS [id number]  . Chronic fatigue 10/24/2008  . Gastroparesis 06/16/2008  . Diabetes mellitus, insulin dependent (IDDM), uncontrolled ([place]) 04/13/2008  . Hyperlipidemia LDL goal <100 04/13/2008  . Generalized anxiety disorder 04/13/2008    Current Outpatient Prescriptions on File Prior to Visit  Medication Sig Dispense Refill  . ACCU-CHEK SOFTCLIX LANCETS lancets USE TO TEST 4 TIMES DAILY. 100 each 5  . diazepam (VALIUM) 10 MG tablet Take 10 mg by mouth daily as needed for anxiety.     . DULoxetine (CYMBALTA) 60 MG capsule Take 1 capsule (60 mg total) by mouth daily. 30 capsule 4  . glucose blood (ACCU-CHEK AVIVA) test strip Use as instructed four times daily testing E11.65 200  each 5  . Insulin Glargine (LANTUS SOLOSTAR) 100 UNIT/ML Solostar Pen Inject 80 Units into the skin daily. 15 mL 11  . Insulin Pen Needle 32G X 4 MM MISC For use with insulin four times daily DX E11.29 150 each 5  . linaclotide (LINZESS) 145 MCG CAPS capsule 1 PO 30 mins WITH your first meal 30 capsule 11  . lisinopril (PRINIVIL,ZESTRIL) 2.5 MG tablet TAKE ONE (1) TABLET EACH DAY 90 tablet 1  . mometasone (NASONEX) 50 MCG/ACT nasal spray Place 2 sprays into the nose daily. (Patient taking differently: Place 2 sprays into the nose daily as needed (Allergies). ) 17 g 12  . montelukast (SINGULAIR) 10 MG tablet TAKE ONE TABLET BY MOUTH EVERY NIGHT AT BEDTIME 30 tablet 3  . NOVOLOG FLEXPEN 100 UNIT/ML FlexPen INJECT 10 TO 20 UNITS THREE TIMES A DAY WITH MEALS 15 mL 5  . omeprazole (PRILOSEC) 40 MG capsule TAKE ONE CAPSULE BY MOUTH 30 MINUTES PRIOR TO BREAKFAST AND SUPPER 180 capsule 3  . ondansetron (ZOFRAN) 4 MG tablet TAKE ONE TABLET BY MOUTH EVERY EIGHT HOURS AS NEEDED FOR NAUSEA OR VOMITING 60 tablet 1  . ondansetron (ZOFRAN) 4 MG tablet TAKE ONE TABLET EVERY 8 HOURS AS NEEDED FOR NAUSEA OR VOMITING 60 tablet 3  . pravastatin (PRAVACHOL) 80 MG tablet TAKE 1 TABLET BY MOUTH IN THE EVENING 90 tablet 1   No current facility-administered medications on file prior to visit.     No Known Allergies  Objective:  General: Alert and oriented x3 in no acute distress  Dermatology: No open lesions bilateral lower extremities, no webspace macerations,  no ecchymosis bilateral, all nails x 10 are well manicured.  Vascular: Focal edema noted to right 2nd toe. Dorsalis Pedis and Posterior Tibial pedal pulses 1/4, Capillary Fill Time 3 seconds,(-) pedal hair growth bilateral, Temperature gradient within normal limits.  Neurology: Gross sensation intact via light touch bilateral, Protective sensation absent  with Thornell Mule Monofilament to all pedal sites, No babinski sign present bilateral. (- )Tinels  sign right foot.   Musculoskeletal: There is tenderness with palpation at 2nd toe on right, Varus lesser hammertoes. No pain with calf compression bilateral. All joint range of motion is within normal limits except at 2nd toe on right where there is pain, Strength within normal limits in all groups bilateral.   Gait: Unassisted, Antalgic gait  Xrays  Right Foot   Impression: Closed nondisplaced fracture at head of proximal phalanx of 2nd toe on right foot    Assessment and Plan: Problem List Items Addressed This Visit    None    Visit Diagnoses    Swelling of toe of right foot    -  Primary   Relevant Orders   DG Foot 2 Views Right   Closed fracture of phalanx of right second toe, initial encounter       Diabetic polyneuropathy associated with type 2 diabetes mellitus (HCC)           -Complete examination performed -Xrays reviewed -Discussed treatement options for fracture; risks, alternatives, and benefits explained. -Applied Toe splint and instructed patient to do the same daily  -Dispensed post op shoe to patient to wear at all times and instructed on use -Recommend protection, rest, elevation daily until symptoms improve -Patient to return to office in 5-6 weeks for serial x-rays to assess healing  or sooner if condition worsens.  Landis Martins, DPM

## 2016-12-18 ENCOUNTER — Ambulatory Visit (HOSPITAL_COMMUNITY)
Admission: RE | Admit: 2016-12-18 | Discharge: 2016-12-18 | Disposition: A | Discharge status: Home / Self Care | Payer: PPO | Source: Ambulatory Visit | Attending: Gastroenterology | Admitting: Gastroenterology

## 2016-12-18 DIAGNOSIS — K824 Cholesterolosis of gallbladder: Secondary | ICD-10-CM | POA: Diagnosis not present

## 2016-12-18 DIAGNOSIS — R197 Diarrhea, unspecified: Secondary | ICD-10-CM | POA: Insufficient documentation

## 2016-12-18 DIAGNOSIS — R140 Abdominal distension (gaseous): Secondary | ICD-10-CM | POA: Insufficient documentation

## 2016-12-22 NOTE — Progress Notes (Signed)
Please let patient know the gallbladder polyp in unchanged in size since 2016, only 33mm.  Unlikely source of recent gi symptoms.   According to guidelines, her gb polyps is likely benign given small size and has not grown since 2016. If she wanted to discuss with a surgeon, we could offer consultation.

## 2016-12-23 NOTE — Progress Notes (Signed)
Pt is aware and does not want to see surgeon at this time.

## 2016-12-24 ENCOUNTER — Telehealth: Payer: Self-pay | Admitting: Gastroenterology

## 2016-12-24 NOTE — Telephone Encounter (Signed)
Pt said the linzess was working for her and to call a prescription into [facility].

## 2016-12-24 NOTE — Progress Notes (Signed)
LMOM to call.

## 2016-12-24 NOTE — Progress Notes (Signed)
Noted. Would repeat ruq u/s once more in one year.

## 2016-12-25 NOTE — Progress Notes (Signed)
Pt aware and sending to Manuela Schwartz to nic for one year.

## 2016-12-25 NOTE — Telephone Encounter (Signed)
Tried to call pt, no answer. The last Linzess given to her was 72 mcg.  Forwarding to refill box.

## 2016-12-26 ENCOUNTER — Other Ambulatory Visit: Payer: Self-pay | Admitting: Nurse Practitioner

## 2016-12-26 MED ORDER — LINACLOTIDE 72 MCG PO CAPS: ORAL_CAPSULE | ORAL | 5 refills | Status: DC

## 2016-12-26 NOTE — Telephone Encounter (Signed)
Rx sent to pharmacy   

## 2016-12-26 NOTE — Telephone Encounter (Signed)
Called and informed pt that rx for Linzess had been sent to [facility].

## 2016-12-26 NOTE — Addendum Note (Signed)
Addended by: Gordy Levan, ERIC A on: 12/26/2016 03:31 PM   Modules accepted: Orders

## 2016-12-31 ENCOUNTER — Other Ambulatory Visit: Payer: Self-pay

## 2016-12-31 MED ORDER — GLUCOSE BLOOD VI STRP: ORAL_STRIP | 5 refills | Status: DC

## 2017-01-07 ENCOUNTER — Encounter: Payer: Self-pay | Admitting: Family Medicine

## 2017-01-07 ENCOUNTER — Ambulatory Visit (INDEPENDENT_AMBULATORY_CARE_PROVIDER_SITE_OTHER): Payer: PPO | Admitting: Family Medicine

## 2017-01-07 VITALS — BP 120/80 | HR 87 | Resp 15 | Ht 68.0 in | Wt 126.0 lb

## 2017-01-07 DIAGNOSIS — J302 Other seasonal allergic rhinitis: Secondary | ICD-10-CM

## 2017-01-07 DIAGNOSIS — K2100 Gastro-esophageal reflux disease with esophagitis, without bleeding: Secondary | ICD-10-CM

## 2017-01-07 DIAGNOSIS — E785 Hyperlipidemia, unspecified: Secondary | ICD-10-CM

## 2017-01-07 DIAGNOSIS — F418 Other specified anxiety disorders: Secondary | ICD-10-CM

## 2017-01-07 DIAGNOSIS — E103299 Type 1 diabetes mellitus with mild nonproliferative diabetic retinopathy without macular edema, unspecified eye: Secondary | ICD-10-CM

## 2017-01-07 DIAGNOSIS — E1065 Type 1 diabetes mellitus with hyperglycemia: Secondary | ICD-10-CM

## 2017-01-07 DIAGNOSIS — R0789 Other chest pain: Secondary | ICD-10-CM

## 2017-01-07 NOTE — Progress Notes (Signed)
Ashley Armstrong     MRN: [id number]      DOB: 06/20/1966   HPI Ashley Armstrong is here for follow up and re-evaluation of chronic medical conditions, medication management and review of any available recent lab and radiology data.  Preventive health is updated, specifically  Cancer screening and Immunization.   Questions or concerns regarding consultations or procedures which the PT has had in the interim are  Addressed. Bilateral cataract surgery March 12 and 26. Being followed for broken right 2nd toe  The PT denies any adverse reactions to current medications since the last visit.  C/o bilateral ear fullness intermittently which makes her feel dizzy at times , also c/o exertional fatigue x 2weeks, denies specific chest pain, has increased CVD risk  ROS Denies recent fever or chills. Denies sinus pressure, nasal congestion,  or sore throat. Denies chest congestion, productive cough or wheezing. Denies chest pains, palpitations and leg swelling Denies abdominal pain, nausea, vomiting,diarrhea or constipation.   Denies dysuria, frequency, hesitancy or incontinence. Denies joint pain, swelling and limitation in mobility. Denies headaches, seizures, numbness, or tingling. Denies depression, anxiety or insomnia. Denies skin break down or rash.   PE  BP 120/80   Pulse 87   Resp 15   Ht 5\' 8"  (1.727 m)   Wt 126 lb (57.2 kg)   LMP 07/26/2013   SpO2 97%   BMI 19.16 kg/m   Patient alert and oriented and in no cardiopulmonary distress.  HEENT: No facial asymmetry, EOMI,   oropharynx pink and moist.  Neck supple no JVD, no mass.TM clear bilaterallu, good light reflex, no cerumen impaction  Chest: Clear to auscultation bilaterally.  CVS: S1, S2 no murmurs, no S3.Regular rate.  ABD: Soft non tender.   Ext: No edema  MS: Adequate ROM spine, shoulders, hips and knees.  Skin: Intact, no ulcerations or rash noted.  Psych: Good eye contact, normal affect. Memory intact not anxious or  depressed appearing.  CNS: CN 2-12 intact, power,  normal throughout.no focal deficits noted.   Assessment & Plan  Diabetes mellitus, insulin dependent (IDDM), uncontrolled Ashley Armstrong is reminded of the importance of commitment to daily physical activity for 30 minutes or more, as able and the need to limit carbohydrate intake to 30 to 60 grams per meal to help with blood sugar control.   The need to take medication as prescribed, test blood sugar as directed, and to call between visits if there is a concern that blood sugar is uncontrolled is also discussed.   Ashley Armstrong is reminded of the importance of daily foot exam, annual eye examination, and good blood sugar, blood pressure and cholesterol control. Uncontrolled,. Updated lab needed    Diabetic Labs Latest Ref Rng & Units 09/23/2016 05/21/2016 02/06/2016 11/09/2015 08/07/2015  HbA1c <5.7 % 9.0(H) 8.4(H) 8.1(H) 8.3(H) 7.9(H)  Microalbumin Not estab mg/dL 14.7 - - - 17.9(H)  Micro/Creat Ratio <30 mcg/mg creat 136(H) - - - 188.4(H)  Chol 125 - 200 mg/dL - 174 - - -  HDL >=46 mg/dL - 61 - - -  Calc LDL <130 mg/dL - 90 - - -  Triglycerides <150 mg/dL - 115 - - -  Creatinine 0.50 - 1.10 mg/dL 1.08 1.12(H) 1.17(H) 1.00 1.20(H)   BP/Weight 01/07/2017 12/13/2016 11/05/2016 09/23/2016 09/05/2016 05/23/2016 123456  Systolic BP 123456 [id number] A999333 [id number]  Diastolic BP 80 72 85 74 71 70 71  Wt. (Lbs) 126 125.4 124 125 123.2 124.04 124  BMI  19.16 19.07 18.85 19.01 18.73 18.86 18.86   Foot/eye exam completion dates Latest Ref Rng & Units 09/23/2016 06/08/2015  Eye Exam No Retinopathy - -  Foot Form Completion - Done Done        Hyperlipidemia LDL goal <100 Hyperlipidemia:Low fat diet discussed and encouraged.   Lipid Panel  Lab Results  Component Value Date   CHOL 174 05/21/2016   HDL 61 05/21/2016   LDLCALC 90 05/21/2016   TRIG 115 05/21/2016   CHOLHDL 2.9 05/21/2016   Updated lab needed .     Depression with  anxiety Controlled no med change, describes unhealthy living conditions and needs to work on this  GERD (gastroesophageal reflux disease) Controlled, no change in medication   Seasonal allergies Controlled, no change in medication   Atypical chest pain 2 week h/o increased exertional fatigue and light headedness, she has multiple CV risk factors, refer to cardiology

## 2017-01-07 NOTE — Patient Instructions (Addendum)
F/u in 3 month, call if you need me before   HBAC, chem 7 and EGFR, CBC today  Fasting lipid, cmp and EGFr, hBa1C in 3 month  Pls call and schedule you pap  Ear exam is normal  You will be referred to cardiology due to increased exertional fatigue x 2 week  Thank you  for choosing [facility]. We consider it a privelige to serve you.  Delivering excellent health care in a caring and  compassionate way is our goal.  Partnering with you,  so that together we can achieve this goal is our strategy.

## 2017-01-09 ENCOUNTER — Encounter: Payer: Self-pay | Admitting: Gastroenterology

## 2017-01-12 DIAGNOSIS — R0789 Other chest pain: Secondary | ICD-10-CM | POA: Insufficient documentation

## 2017-01-12 NOTE — Assessment & Plan Note (Signed)
Controlled no med change, describes unhealthy living conditions and needs to work on this

## 2017-01-12 NOTE — Assessment & Plan Note (Signed)
Controlled, no change in medication  

## 2017-01-12 NOTE — Assessment & Plan Note (Signed)
Hyperlipidemia:Low fat diet discussed and encouraged.   Lipid Panel  Lab Results  Component Value Date   CHOL 174 05/21/2016   HDL 61 05/21/2016   LDLCALC 90 05/21/2016   TRIG 115 05/21/2016   CHOLHDL 2.9 05/21/2016   Updated lab needed .

## 2017-01-12 NOTE — Assessment & Plan Note (Signed)
Ashley Armstrong is reminded of the importance of commitment to daily physical activity for 30 minutes or more, as able and the need to limit carbohydrate intake to 30 to 60 grams per meal to help with blood sugar control.   The need to take medication as prescribed, test blood sugar as directed, and to call between visits if there is a concern that blood sugar is uncontrolled is also discussed.   Ashley Armstrong is reminded of the importance of daily foot exam, annual eye examination, and good blood sugar, blood pressure and cholesterol control. Uncontrolled,. Updated lab needed    Diabetic Labs Latest Ref Rng & Units 09/23/2016 05/21/2016 02/06/2016 11/09/2015 08/07/2015  HbA1c <5.7 % 9.0(H) 8.4(H) 8.1(H) 8.3(H) 7.9(H)  Microalbumin Not estab mg/dL 14.7 - - - 17.9(H)  Micro/Creat Ratio <30 mcg/mg creat 136(H) - - - 188.4(H)  Chol 125 - 200 mg/dL - 174 - - -  HDL >=46 mg/dL - 61 - - -  Calc LDL <130 mg/dL - 90 - - -  Triglycerides <150 mg/dL - 115 - - -  Creatinine 0.50 - 1.10 mg/dL 1.08 1.12(H) 1.17(H) 1.00 1.20(H)   BP/Weight 01/07/2017 12/13/2016 11/05/2016 09/23/2016 09/05/2016 05/23/2016 123456  Systolic BP 123456 A999333 0000000 [id number] A999333 [id number]  Diastolic BP 80 72 85 74 71 70 71  Wt. (Lbs) 126 125.4 124 125 123.2 124.04 124  BMI 19.16 19.07 18.85 19.01 18.73 18.86 18.86   Foot/eye exam completion dates Latest Ref Rng & Units 09/23/2016 06/08/2015  Eye Exam No Retinopathy - -  Foot Form Completion - Done Done

## 2017-01-12 NOTE — Assessment & Plan Note (Signed)
2 week h/o increased exertional fatigue and light headedness, she has multiple CV risk factors, refer to cardiology

## 2017-01-15 ENCOUNTER — Ambulatory Visit (INDEPENDENT_AMBULATORY_CARE_PROVIDER_SITE_OTHER): Payer: PPO | Admitting: Family Medicine

## 2017-01-15 ENCOUNTER — Other Ambulatory Visit: Payer: Self-pay | Admitting: Family Medicine

## 2017-01-15 ENCOUNTER — Encounter: Payer: Self-pay | Admitting: Family Medicine

## 2017-01-15 VITALS — BP 110/70 | HR 86 | Ht 68.0 in | Wt 126.0 lb

## 2017-01-15 DIAGNOSIS — J302 Other seasonal allergic rhinitis: Secondary | ICD-10-CM | POA: Diagnosis not present

## 2017-01-15 DIAGNOSIS — J0100 Acute maxillary sinusitis, unspecified: Secondary | ICD-10-CM

## 2017-01-15 DIAGNOSIS — E103299 Type 1 diabetes mellitus with mild nonproliferative diabetic retinopathy without macular edema, unspecified eye: Secondary | ICD-10-CM | POA: Diagnosis not present

## 2017-01-15 DIAGNOSIS — E1065 Type 1 diabetes mellitus with hyperglycemia: Secondary | ICD-10-CM | POA: Diagnosis not present

## 2017-01-15 DIAGNOSIS — R0781 Pleurodynia: Secondary | ICD-10-CM

## 2017-01-15 LAB — COMPLETE METABOLIC PANEL WITH GFR
ALK PHOS: 84 U/L (ref 33–115)
ALT: 13 U/L (ref 6–29)
AST: 15 U/L (ref 10–35)
Albumin: 3.7 g/dL (ref 3.6–5.1)
BILIRUBIN TOTAL: 0.4 mg/dL (ref 0.2–1.2)
BUN: 14 mg/dL (ref 7–25)
CALCIUM: 9.3 mg/dL (ref 8.6–10.2)
CO2: 27 mmol/L (ref 20–31)
CREATININE: 1.07 mg/dL (ref 0.50–1.10)
Chloride: 106 mmol/L (ref 98–110)
GFR, EST AFRICAN AMERICAN: 70 mL/min (ref >=60)
GFR, EST NON AFRICAN AMERICAN: 61 mL/min (ref >=60)
Glucose, Bld: 108 mg/dL — ABNORMAL HIGH (ref 65–99)
Potassium: 4.3 mmol/L (ref 3.5–5.3)
Sodium: 142 mmol/L (ref 135–146)
TOTAL PROTEIN: 6.4 g/dL (ref 6.1–8.1)

## 2017-01-15 LAB — CBC
HCT: 36.6 % (ref 35.0–45.0)
Hemoglobin: 12.1 g/dL (ref 11.7–15.5)
MCH: 28.6 pg (ref 27.0–33.0)
MCHC: 33.1 g/dL (ref 32.0–36.0)
MCV: 86.5 fL (ref 80.0–100.0)
MPV: 9.5 fL (ref 7.5–12.5)
PLATELETS: 236 10*3/uL (ref 140–400)
RBC: 4.23 MIL/uL (ref 3.80–5.10)
RDW: 13.8 % (ref 11.0–15.0)
WBC: 5.3 10*3/uL (ref 3.8–10.8)

## 2017-01-15 LAB — HEMOGLOBIN A1C
Hgb A1c MFr Bld: 8.8 % — ABNORMAL HIGH (ref <5.7)
MEAN PLASMA GLUCOSE: 206 mg/dL

## 2017-01-15 MED ORDER — AZELASTINE HCL 0.1 % NA SOLN: 2.0000 | Freq: Two times a day (BID) | NASAL | 12 refills | Status: DC

## 2017-01-15 MED ORDER — AZITHROMYCIN 250 MG PO TABS: ORAL_TABLET | ORAL | 0 refills | Status: DC

## 2017-01-15 NOTE — Patient Instructions (Signed)
F/u as before   Add an additional nasal  Spray daily for uncontrolled allergies. Z pack prescribed for sinus infection  Please get X ray this morning  Thank you  for choosing [facility]. We consider it a privelige to serve you.  Delivering excellent health care in a caring and  compassionate way is our goal.  Partnering with you,  so that together we can achieve this goal is our strategy.

## 2017-01-15 NOTE — Assessment & Plan Note (Signed)
6 day h/o localized left lower  rib pain after bending to pick a shirt

## 2017-01-15 NOTE — Assessment & Plan Note (Signed)
Z pack prescribed 

## 2017-01-15 NOTE — Progress Notes (Signed)
   Ashley Armstrong     MRN: [id number]      DOB: [birth year]   HPI Ashley Armstrong is here with a 6 day h/o sinus pressure, foul tasting/ smelling pos nasal drainage and sore throat. Denies ear pain, ahs increased cough but no sputum C/o increased sneezing and watery eyes and drainage C/o localized left rib pain which is reproducible, first experienced when she bent to pick up a T shirt, not aggravated by breathing or upper body movemement  ROS C/o recent fever and chills 5 days ago  Denies abdominal pain, nausea, vomiting,diarrhea or constipation.   Denies dysuria, frequency, hesitancy or incontinence.  Denies headaches, seizures, numbness, or tingling. Denies depression, anxiety or insomnia. Denies skin break down or rash.   PE  BP 110/70   Pulse 86   Ht 5\' 8"  (1.727 m)   Wt 126 lb (57.2 kg)   LMP 07/26/2013   SpO2 99%   BMI 19.16 kg/m   Patient alert and oriented and in no cardiopulmonary distress.  HEENT: No facial asymmetry, EOMI,   oropharynx pink and moist.  Neck supple no JVD, no mass.Maxillary sinus tenderness, TM clear, erythema of oropharynx  Chest: Clear to auscultation bilaterally.Reproducible localized left lower rib tenderness, anterolateral aspect  CVS: S1, S2 no murmurs, no S3.Regular rate.  ABD: Soft non tender.   Ext: No edema  MS: Adequate ROM spine, shoulders, hips and knees.  Skin: Intact, no ulcerations or rash noted.  Psych: Good eye contact, normal affect. Memory intact not anxious or depressed appearing.  CNS: CN 2-12 intact, power,  normal throughout.no focal deficits noted.   Assessment & Plan  Rib pain on left side 6 day h/o localized left lower  rib pain after bending to pick a shirt  Seasonal allergies uncontrolled add astelin  Acute sinusitis Z pack prescribed

## 2017-01-15 NOTE — Assessment & Plan Note (Signed)
uncontrolled add astelin

## 2017-01-16 ENCOUNTER — Encounter: Payer: Self-pay | Admitting: Family Medicine

## 2017-01-16 ENCOUNTER — Other Ambulatory Visit: Payer: Self-pay | Admitting: Family Medicine

## 2017-01-16 MED ORDER — INSULIN GLARGINE 100 UNITS/ML SOLOSTAR PEN: PEN_INJECTOR | SUBCUTANEOUS | 11 refills | Status: DC

## 2017-01-20 DIAGNOSIS — H2512 Age-related nuclear cataract, left eye: Secondary | ICD-10-CM | POA: Diagnosis not present

## 2017-01-20 DIAGNOSIS — H25042 Posterior subcapsular polar age-related cataract, left eye: Secondary | ICD-10-CM | POA: Diagnosis not present

## 2017-01-21 ENCOUNTER — Encounter: Payer: Self-pay | Admitting: Sports Medicine

## 2017-01-21 ENCOUNTER — Ambulatory Visit (INDEPENDENT_AMBULATORY_CARE_PROVIDER_SITE_OTHER): Payer: PPO

## 2017-01-21 ENCOUNTER — Ambulatory Visit (INDEPENDENT_AMBULATORY_CARE_PROVIDER_SITE_OTHER): Payer: PPO | Admitting: Sports Medicine

## 2017-01-21 DIAGNOSIS — H2511 Age-related nuclear cataract, right eye: Secondary | ICD-10-CM | POA: Diagnosis not present

## 2017-01-21 DIAGNOSIS — S92501D Displaced unspecified fracture of right lesser toe(s), subsequent encounter for fracture with routine healing: Secondary | ICD-10-CM | POA: Diagnosis not present

## 2017-01-21 DIAGNOSIS — M79671 Pain in right foot: Secondary | ICD-10-CM

## 2017-01-21 DIAGNOSIS — E1142 Type 2 diabetes mellitus with diabetic polyneuropathy: Secondary | ICD-10-CM

## 2017-01-21 NOTE — Progress Notes (Signed)
Subjective: Ashley Armstrong is a 51 y.o. Diabetic female patient who returns to office for evaluation of Right foot pain and swelling secondary to fracture of 2nd toe. Patient denies any other pedal complaints.   Diagnosed by me on 12-17-16 after negative xrays in ER on 11-05-16  Patient Active Problem List   Diagnosis Date Noted  . Rib pain on left side 01/15/2017  . Atypical chest pain 01/12/2017  . Gallbladder polyp 12/13/2016  . Shoulder pain, right 09/23/2016  . GERD (gastroesophageal reflux disease) 09/05/2016  . Constipation 10/12/2015  . Seasonal allergies 11/09/2014  . Depression with anxiety 06/18/2014  . Tight fascia 01/17/2014  . Degenerative disc disease, cervical 12/07/2013  . Thoracic back pain 07/31/2013  . CTS (carpal tunnel syndrome) 07/08/2013  . Polyneuropathy ([place]) 07/08/2013  . Diabetic nephropathy ([place]) 06/23/2013  . TIA (transient ischemic attack) 04/28/2013  . Tennis elbow syndrome 04/15/2013  . Rotator cuff syndrome of left shoulder 04/15/2013  . Vitamin D deficiency 03/20/2013  . Onychomycosis of toenail 03/20/2013  . Acute sinusitis 11/21/2009  . DIABETES MELLITUS, WITH RENAL COMPLICATIONS 89/21/1941  . Gastroparesis 06/16/2008  . Diabetes mellitus, insulin dependent (IDDM), uncontrolled ([place]) 04/13/2008  . Hyperlipidemia LDL goal <100 04/13/2008  . Generalized anxiety disorder 04/13/2008    Current Outpatient Prescriptions on File Prior to Visit  Medication Sig Dispense Refill  . azelastine (ASTELIN) 0.1 % nasal spray Place 2 sprays into both nostrils 2 (two) times daily. Use in each nostril as directed 30 mL 12  . azithromycin (ZITHROMAX) 250 MG tablet Two tablets on day one, then one tablet once daily fro an additional 4 days 6 tablet 0  . diazepam (VALIUM) 10 MG tablet Take 10 mg by mouth daily as needed for anxiety.     . DULoxetine (CYMBALTA) 60 MG capsule Take 1 capsule (60 mg total) by mouth daily. 30 capsule 4  . glucose blood (ONE TOUCH  ULTRA TEST) test strip Use as instructed 4 times daily dx e11.65 200 each 5  . insulin glargine (LANTUS) 100 unit/mL SOPN Inject 90 units into the skin daily 15 mL 11  . Insulin Pen Needle 32G X 4 MM MISC For use with insulin four times daily DX E11.29 150 each 5  . linaclotide (LINZESS) 72 MCG capsule 1 PO 30 mins WITH your first meal 30 capsule 5  . lisinopril (PRINIVIL,ZESTRIL) 2.5 MG tablet TAKE ONE (1) TABLET EACH DAY 90 tablet 1  . mometasone (NASONEX) 50 MCG/ACT nasal spray Place 2 sprays into the nose daily. (Patient taking differently: Place 2 sprays into the nose daily as needed (Allergies). ) 17 g 12  . montelukast (SINGULAIR) 10 MG tablet TAKE ONE TABLET BY MOUTH EVERY NIGHT AT BEDTIME 90 tablet 1  . NOVOLOG FLEXPEN 100 UNIT/ML FlexPen INJECT 10 TO 20 UNITS THREE TIMES A DAY WITH MEALS 15 mL 5  . omeprazole (PRILOSEC) 40 MG capsule TAKE ONE CAPSULE BY MOUTH 30 MINUTES PRIOR TO BREAKFAST AND SUPPER 180 capsule 3  . ondansetron (ZOFRAN) 4 MG tablet TAKE ONE TABLET BY MOUTH EVERY EIGHT HOURS AS NEEDED FOR NAUSEA OR VOMITING 60 tablet 1  . ondansetron (ZOFRAN) 4 MG tablet TAKE ONE TABLET EVERY 8 HOURS AS NEEDED FOR NAUSEA OR VOMITING 60 tablet 3  . pravastatin (PRAVACHOL) 80 MG tablet TAKE 1 TABLET BY MOUTH IN THE EVENING 90 tablet 1   No current facility-administered medications on file prior to visit.     No Known Allergies  Objective:  General: Alert  and oriented x3 in no acute distress  Dermatology: No open lesions bilateral lower extremities, no webspace macerations, no ecchymosis bilateral, all nails x 10 are well manicured.  Vascular: Focal edema noted to right 2nd toe. Dorsalis Pedis and Posterior Tibial pedal pulses 1/4, Capillary Fill Time 3 seconds,(-) pedal hair growth bilateral, Temperature gradient within normal limits.  Neurology: Gross sensation intact via light touch bilateral, Protective sensation absent with Thornell Mule Monofilament to all pedal sites, No  babinski sign present bilateral. (- )Tinels sign right foot.   Musculoskeletal: There is tenderness with palpation at 2nd toe on right, Varus lesser hammertoes. No pain with calf compression bilateral. All joint range of motion is within normal limits except at 2nd toe on right where there is pain, Strength within normal limits in all groups bilateral.   Gait: Unassisted, Antalgic gait  Xrays  Right Foot   Impression: Closed nondisplaced fracture at head of proximal phalanx of 2nd toe on right foot with minimal healing     Assessment and Plan: Problem List Items Addressed This Visit    None    Visit Diagnoses    Right foot pain    -  Primary   Relevant Orders   DG Foot Complete Right (Completed)   Closed fracture of second toe of right foot, with routine healing, subsequent encounter       Diabetic polyneuropathy associated with type 2 diabetes mellitus (HCC)           -Complete examination performed -Xrays reviewed -Discussed treatement options for fracture; risks, alternatives, and benefits explained. -Applied Toe splint and instructed patient to do the same daily  -Recommend continue with post op shoe to patient to wear at all times and instructed on use or a stiff sole shoe -Recommend protection, rest, elevation daily until symptoms improve -Patient to return to office in 5-6 weeks for serial x-rays to assess healing  or sooner if condition worsens. Patient eligible for bone stim after 03-16-17.   Landis Martins, DPM

## 2017-02-03 DIAGNOSIS — H2512 Age-related nuclear cataract, left eye: Secondary | ICD-10-CM | POA: Diagnosis not present

## 2017-02-03 DIAGNOSIS — H2511 Age-related nuclear cataract, right eye: Secondary | ICD-10-CM | POA: Diagnosis not present

## 2017-02-06 ENCOUNTER — Other Ambulatory Visit: Payer: Self-pay | Admitting: Family Medicine

## 2017-02-07 ENCOUNTER — Ambulatory Visit (INDEPENDENT_AMBULATORY_CARE_PROVIDER_SITE_OTHER): Payer: PPO | Admitting: Cardiovascular Disease

## 2017-02-07 ENCOUNTER — Encounter: Payer: Self-pay | Admitting: Cardiovascular Disease

## 2017-02-07 VITALS — BP 128/76 | HR 90 | Ht 68.0 in | Wt 128.0 lb

## 2017-02-07 DIAGNOSIS — E782 Mixed hyperlipidemia: Secondary | ICD-10-CM | POA: Diagnosis not present

## 2017-02-07 DIAGNOSIS — R5383 Other fatigue: Secondary | ICD-10-CM

## 2017-02-07 DIAGNOSIS — I10 Essential (primary) hypertension: Secondary | ICD-10-CM

## 2017-02-07 DIAGNOSIS — E1165 Type 2 diabetes mellitus with hyperglycemia: Secondary | ICD-10-CM | POA: Diagnosis not present

## 2017-02-07 DIAGNOSIS — R0609 Other forms of dyspnea: Secondary | ICD-10-CM

## 2017-02-07 NOTE — Progress Notes (Signed)
CARDIOLOGY CONSULT NOTE  Patient ID: NEVAEHA FINERTY MRN: [id number] DOB/AGE: 05/17/1966 51 y.o.  Admit date: (Not on file) Primary Physician: [name], MD Referring Physician: Moshe Cipro  Reason for Consultation: exertional fatigue and chest pain  HPI: CASHMERE DINGLEY is a 51 y.o. female who is being seen today for the evaluation of exertional fatigue and chest pain at the request of Fayrene Helper, MD.  Pertinent past medical history includes insulin-dependent diabetes mellitus, hypertension, and hyperlipidemia.  She tells me that about a month ago she had significant shortness of breath when mopping her floor. She wasn't sure if it was due to gastroparesis and abdominal distention causing her to feel this way. She denies exertional chest pain. When she sits down and stands up she gets lightheaded and dizzy on occasion and has come close to passing out but never lost consciousness.  She denies leg swelling and orthopnea. She has awoken in the middle of night feeling short of breath but wasn't sure if this was due to anxiety.  She says she has a history of "bad anxiety ".  She smoked for 28 years and quit on 08/24/11.  She also wonders if her exertional dyspnea is due to being in active as she has broken toes in her right foot and wears a boot.  Echocardiogram 04/28/13 demonstrated normal left ventricular systolic function and regional wall motion, LVEF 60-65%.  Labs on 01/15/17 demonstrated the following: Elevated HbA1c of 8.8%, BUN 14, creatinine 1.07, hemoglobin 12.1.  Lipids 05/21/16: Total cholesterol 174, triglycerides 115, HDL 61, LDL 90.  ECG performed in the office today which I ordered and personally interpreted demonstrates normal sinus rhythm with no ischemic ST segment or T-wave abnormalities, nor any arrhythmias.    No Known Allergies  Current Outpatient Prescriptions  Medication Sig Dispense Refill  . azelastine (ASTELIN) 0.1 % nasal spray Place 2  sprays into both nostrils 2 (two) times daily. Use in each nostril as directed 30 mL 12  . azithromycin (ZITHROMAX) 250 MG tablet Two tablets on day one, then one tablet once daily fro an additional 4 days 6 tablet 0  . diazepam (VALIUM) 10 MG tablet Take 10 mg by mouth daily as needed for anxiety.     . DULoxetine (CYMBALTA) 60 MG capsule Take 1 capsule (60 mg total) by mouth daily. 30 capsule 4  . glucose blood (ONE TOUCH ULTRA TEST) test strip Use as instructed 4 times daily dx e11.65 200 each 5  . insulin glargine (LANTUS) 100 unit/mL SOPN Inject 90 units into the skin daily 15 mL 11  . Insulin Pen Needle 32G X 4 MM MISC For use with insulin four times daily DX E11.29 150 each 5  . linaclotide (LINZESS) 72 MCG capsule 1 PO 30 mins WITH your first meal 30 capsule 5  . lisinopril (PRINIVIL,ZESTRIL) 2.5 MG tablet TAKE ONE (1) TABLET EACH DAY 90 tablet 1  . mometasone (NASONEX) 50 MCG/ACT nasal spray Place 2 sprays into the nose daily. (Patient taking differently: Place 2 sprays into the nose daily as needed (Allergies). ) 17 g 12  . montelukast (SINGULAIR) 10 MG tablet TAKE ONE TABLET BY MOUTH EVERY NIGHT AT BEDTIME 90 tablet 1  . NOVOLOG FLEXPEN 100 UNIT/ML FlexPen INJECT 10 TO 20 UNITS THREE TIMES A DAY WITH MEALS 15 mL 5  . omeprazole (PRILOSEC) 40 MG capsule TAKE ONE CAPSULE BY MOUTH 30 MINUTES PRIOR TO BREAKFAST AND SUPPER 180 capsule 3  .  ondansetron (ZOFRAN) 4 MG tablet TAKE ONE TABLET BY MOUTH EVERY EIGHT HOURS AS NEEDED FOR NAUSEA OR VOMITING 60 tablet 1  . ondansetron (ZOFRAN) 4 MG tablet TAKE ONE TABLET EVERY 8 HOURS AS NEEDED FOR NAUSEA OR VOMITING 60 tablet 3  . pravastatin (PRAVACHOL) 80 MG tablet TAKE 1 TABLET BY MOUTH IN THE EVENING 90 tablet 1   No current facility-administered medications for this visit.     Past Medical History:  Diagnosis Date  . Anxiety   . Dyslipidemia   . Gastroparesis due to DM ([place]) OCT 2014   75% AT 2 HRS, GLU >   . Headache(784.0)   . IDDM  (insulin dependent diabetes mellitus) ([place])   . Neuropathy ([place])   . Nicotine addiction   . TIA (transient ischemic attack)    Dr. Merlene Laughter    Past Surgical History:  Procedure Laterality Date  . COLONOSCOPY N/A 05/09/2016   Procedure: COLONOSCOPY;  Surgeon: Danie Binder, MD;  Location: [facility];  Service: Endoscopy;  Laterality: N/A;  130  . ESOPHAGOGASTRODUODENOSCOPY N/A 08/31/2013   SLF: 1. [place]y satiety nausea may be due to Gastroparesis/pyloric channel stenosis. 2. small hiatal hernia 3. Moderate erosive gastritis.   Marland Kitchen laser surgery bilateral eye      Social History   Social History  . Marital status: Single    Spouse name: N/A  . Number of children: N/A  . Years of education: N/A   Occupational History  . Not on file.   Social History Main Topics  . Smoking status: Former Research scientist (life sciences)  . Smokeless tobacco: Never Used     Comment: August 25, 2011 QUIT.   Marland Kitchen Alcohol use No  . Drug use: No  . Sexual activity: Yes    Birth control/ protection: None   Other Topics Concern  . Not on file   Social History Narrative  . No narrative on file     No family history of premature CAD in 1st degree relatives.  Prior to Admission medications   Medication Sig Start Date End Date Taking? Authorizing Provider  azelastine (ASTELIN) 0.1 % nasal spray Place 2 sprays into both nostrils 2 (two) times daily. Use in each nostril as directed 01/15/17  Yes Fayrene Helper, MD  azithromycin [facility]) 250 MG tablet Two tablets on day one, then one tablet once daily fro an additional 4 days 01/15/17  Yes Fayrene Helper, MD  diazepam (VALIUM) 10 MG tablet Take 10 mg by mouth daily as needed for anxiety.    Yes Historical Provider, MD  DULoxetine (CYMBALTA) 60 MG capsule Take 1 capsule (60 mg total) by mouth daily. 05/23/16  Yes Fayrene Helper, MD  glucose blood (ONE TOUCH ULTRA TEST) test strip Use as instructed 4 times daily dx e11.65 12/31/16  Yes Fayrene Helper, MD  insulin  glargine (LANTUS) 100 unit/mL SOPN Inject 90 units into the skin daily 01/16/17  Yes Fayrene Helper, MD  Insulin Pen Needle 32G X 4 MM MISC For use with insulin four times daily DX E11.29 06/18/16  Yes Fayrene Helper, MD  linaclotide [facility]) 72 MCG capsule 1 PO 30 mins WITH your first meal 12/26/16  Yes Carlis Stable, NP  lisinopril (PRINIVIL,ZESTRIL) 2.5 MG tablet TAKE ONE (1) TABLET EACH DAY 11/13/16  Yes Fayrene Helper, MD  mometasone (NASONEX) 50 MCG/ACT nasal spray Place 2 sprays into the nose daily. Patient taking differently: Place 2 sprays into the nose daily as needed (Allergies).  11/09/14  Yes Fayrene Helper, MD  montelukast (SINGULAIR) 10 MG tablet TAKE ONE TABLET BY MOUTH EVERY NIGHT AT BEDTIME 01/15/17  Yes Fayrene Helper, MD  NOVOLOG FLEXPEN 100 UNIT/ML FlexPen INJECT 10 TO 20 UNITS THREE TIMES A DAY WITH MEALS 09/10/16  Yes Fayrene Helper, MD  omeprazole (PRILOSEC) 40 MG capsule TAKE ONE CAPSULE BY MOUTH 30 MINUTES PRIOR TO BREAKFAST AND SUPPER 08/08/16  Yes Annitta Needs, NP  ondansetron (ZOFRAN) 4 MG tablet TAKE ONE TABLET BY MOUTH EVERY EIGHT HOURS AS NEEDED FOR NAUSEA OR VOMITING 06/17/16  Yes Carlis Stable, NP  ondansetron (ZOFRAN) 4 MG tablet TAKE ONE TABLET EVERY 8 HOURS AS NEEDED FOR NAUSEA OR VOMITING 11/26/16  Yes Mahala Menghini, PA-C  pravastatin (PRAVACHOL) 80 MG tablet TAKE 1 TABLET BY MOUTH IN THE EVENING 11/13/16  Yes Fayrene Helper, MD     Review of systems complete and found to be negative unless listed above in HPI     Physical exam Blood pressure 128/76, pulse 90, height 5\' 8"  (1.727 m), weight 128 lb (58.1 kg), last menstrual period 07/26/2013, SpO2 99 %. General: NAD Neck: No JVD, no thyromegaly or thyroid nodule.  Lungs: Clear to auscultation bilaterally with normal respiratory effort. CV: Nondisplaced PMI. Regular rate and rhythm, normal S1/S2, no S3/S4, no murmur.  No peripheral edema.  No carotid bruit.  Normal pedal pulses.  Abdomen: Soft,  nontender, no hepatosplenomegaly, no distention.  Skin: Intact without lesions or rashes.  Neurologic: Alert and oriented x 3.  Psych: Normal affect. Extremities: No clubbing or cyanosis.  HEENT: Normal.   ECG: Most recent ECG reviewed.  Telemetry: Independently reviewed.  Labs:   Lab Results  Component Value Date   WBC 5.3 01/15/2017   HGB 12.1 01/15/2017   HCT 36.6 01/15/2017   MCV 86.5 01/15/2017   PLT 236 01/15/2017   No results for input(s): NA, K, CL, CO2, BUN, CREATININE, CALCIUM, PROT, BILITOT, ALKPHOS, ALT, AST, GLUCOSE in the last 168 hours.  Invalid input(s): LABALBU No results found for: CKTOTAL, CKMB, CKMBINDEX, TROPONINI  Lab Results  Component Value Date   CHOL 174 05/21/2016   CHOL 163 06/01/2014   CHOL 243 (H) 04/28/2013   Lab Results  Component Value Date   HDL 61 05/21/2016   HDL 62 06/01/2014   HDL 83 04/28/2013   Lab Results  Component Value Date   LDLCALC 90 05/21/2016   LDLCALC 80 06/01/2014   LDLCALC 126 (H) 04/28/2013   Lab Results  Component Value Date   TRIG 115 05/21/2016   TRIG 106 06/01/2014   TRIG 170 (H) 04/28/2013   Lab Results  Component Value Date   CHOLHDL 2.9 05/21/2016   CHOLHDL 2.6 06/01/2014   CHOLHDL 2.9 04/28/2013   No results found for: LDLDIRECT       Studies: No results found.  ASSESSMENT AND PLAN:  1. Exertional dyspnea and fatigue: She has multiple risk factors for heart or vascular disease including poorly controlled diabetes, hypertension, and hyperlipidemia, as well as a prior history of tobacco abuse. I will proceed with a nuclear myocardial perfusion imaging study to evaluate for ischemic heart disease (Lexiscan Myoview).  2. Hypertension: Controlled. No changes to therapy.  3. Hyperlipidemia: Continue statin therapy. Lipids reviewed above.  4. Poorly controlled diabetes mellitus: Currently on insulin. HbA1c reviewed above.  Disposition: Follow-up in 6 weeks.    Signed: Kate Sable,  M.D., F.A.C.C.  02/07/2017, 9:04 AM

## 2017-02-07 NOTE — Patient Instructions (Signed)
Your physician recommends that you schedule a follow-up appointment in:  6 weeks    Your physician recommends that you continue on your current medications as directed. Please refer to the Current Medication list given to you today.     Your physician has requested that you have a lexiscan myoview. For further information please visit [email or web address]. Please follow instruction sheet, as given.        Thank you for choosing [facility] !

## 2017-02-10 ENCOUNTER — Other Ambulatory Visit: Payer: Self-pay

## 2017-02-10 ENCOUNTER — Other Ambulatory Visit: Payer: Self-pay | Admitting: Family Medicine

## 2017-02-10 DIAGNOSIS — M79602 Pain in left arm: Secondary | ICD-10-CM

## 2017-02-10 MED ORDER — INSULIN ASPART 100 UNIT/ML FLEXPEN: PEN_INJECTOR | SUBCUTANEOUS | 5 refills | Status: DC

## 2017-02-12 ENCOUNTER — Ambulatory Visit (INDEPENDENT_AMBULATORY_CARE_PROVIDER_SITE_OTHER): Payer: PPO

## 2017-02-12 VITALS — BP 120/70 | HR 80 | Temp 98.6°F | Ht 68.0 in | Wt 128.1 lb

## 2017-02-12 DIAGNOSIS — Z1231 Encounter for screening mammogram for malignant neoplasm of breast: Secondary | ICD-10-CM | POA: Diagnosis not present

## 2017-02-12 DIAGNOSIS — Z0000 Encounter for general adult medical examination without abnormal findings: Secondary | ICD-10-CM | POA: Diagnosis not present

## 2017-02-12 DIAGNOSIS — Z1239 Encounter for other screening for malignant neoplasm of breast: Secondary | ICD-10-CM

## 2017-02-12 NOTE — Patient Instructions (Signed)
Ashley Armstrong , Thank you for taking time to come for your Medicare Wellness Visit. I appreciate your ongoing commitment to your health goals. Please review the following plan we discussed and let me know if I can assist you in the future.   Screening recommendations/referrals: Colonoscopy: Up to date, next due 04/2026  Mammogram: Due this month, please call [facility] and schedule as soon as you are able Bone Density: Due at age 50 Diabetic eye Exam: Overdue, patient will call and schedule Recommended yearly dental visit for hygiene and checkup  Vaccinations: Influenza vaccine: Up to date, next due 06/2017 Pneumococcal vaccine: Due at age 77 Tdap vaccine: Up to date, next due 06/2024  Shingles vaccine: Up to date    Advanced directives: Advance directive discussed with you today. I have provided a copy for you to complete at home and have notarized. Once this is complete please bring a copy in to our office so we can scan it into your chart.  Conditions/risks identified: Diabetic retinopathy, recommend you get a diabetic eye exam at least once a year.  Next appointment: Follow up with Dr. Moshe Cipro on 04/15/2017 at 10:20 am. Follow up in 1 year for your annual wellness visit.  Preventive Care 40-64 Years, Female Preventive care refers to lifestyle choices and visits with your health care provider that can promote health and wellness. What does preventive care include?  A yearly physical exam. This is also called an annual well check.  Dental exams once or twice a year.  Routine eye exams. Ask your health care provider how often you should have your eyes checked.  Personal lifestyle choices, including:  Daily care of your teeth and gums.  Regular physical activity.  Eating a healthy diet.  Avoiding tobacco and drug use.  Limiting alcohol use.  Practicing safe sex.  Taking low-dose aspirin daily starting at age 30.  Taking vitamin and mineral supplements as recommended by your  health care provider. What happens during an annual well check? The services and screenings done by your health care provider during your annual well check will depend on your age, overall health, lifestyle risk factors, and family history of disease. Counseling  Your health care provider may ask you questions about your:  Alcohol use.  Tobacco use.  Drug use.  Emotional well-being.  Home and relationship well-being.  Sexual activity.  Eating habits.  Work and work Statistician.  Method of birth control.  Menstrual cycle.  Pregnancy history. Screening  You may have the following tests or measurements:  Height, weight, and BMI.  Blood pressure.  Lipid and cholesterol levels. These may be checked every 5 years, or more frequently if you are over 80 years old.  Skin check.  Lung cancer screening. You may have this screening every year starting at age 54 if you have a 30-pack-year history of smoking and currently smoke or have quit within the past 15 years.  Fecal occult blood test (FOBT) of the stool. You may have this test every year starting at age 2.  Flexible sigmoidoscopy or colonoscopy. You may have a sigmoidoscopy every 5 years or a colonoscopy every 10 years starting at age 14.  Hepatitis C blood test.  Hepatitis B blood test.  Sexually transmitted disease (STD) testing.  Diabetes screening. This is done by checking your blood sugar (glucose) after you have not eaten for a while (fasting). You may have this done every 1-3 years.  Mammogram. This may be done every 1-2 years. Talk to  your health care provider about when you should start having regular mammograms. This may depend on whether you have a family history of breast cancer.  BRCA-related cancer screening. This may be done if you have a family history of breast, ovarian, tubal, or peritoneal cancers.  Pelvic exam and Pap test. This may be done every 3 years starting at age 47. Starting at age 76,  this may be done every 5 years if you have a Pap test in combination with an HPV test.  Bone density scan. This is done to screen for osteoporosis. You may have this scan if you are at high risk for osteoporosis. Discuss your test results, treatment options, and if necessary, the need for more tests with your health care provider. Vaccines  Your health care provider may recommend certain vaccines, such as:  Influenza vaccine. This is recommended every year.  Tetanus, diphtheria, and acellular pertussis (Tdap, Td) vaccine. You may need a Td booster every 10 years.  Zoster vaccine. You may need this after age 73.  Pneumococcal 13-valent conjugate (PCV13) vaccine. You may need this if you have certain conditions and were not previously vaccinated.  Pneumococcal polysaccharide (PPSV23) vaccine. You may need one or two doses if you smoke cigarettes or if you have certain conditions. Talk to your health care provider about which screenings and vaccines you need and how often you need them. This information is not intended to replace advice given to you by your health care provider. Make sure you discuss any questions you have with your health care provider. Document Released: 11/24/2015 Document Revised: 07/17/2016 Document Reviewed: 08/29/2015 Elsevier Interactive Patient Education  2017 [employer] Prevention in the Home Falls can cause injuries. They can happen to people of all ages. There are many things you can do to make your home safe and to help prevent falls. What can I do on the outside of my home?  Regularly fix the edges of walkways and driveways and fix any cracks.  Remove anything that might make you trip as you walk through a door, such as a raised step or threshold.  Trim any bushes or trees on the path to your home.  Use bright outdoor lighting.  Clear any walking paths of anything that might make someone trip, such as rocks or tools.  Regularly check to see  if handrails are loose or broken. Make sure that both sides of any steps have handrails.  Any raised decks and porches should have guardrails on the edges.  Have any leaves, snow, or ice cleared regularly.  Use sand or salt on walking paths during winter.  Clean up any spills in your garage right away. This includes oil or grease spills. What can I do in the bathroom?  Use night lights.  Install grab bars by the toilet and in the tub and shower. Do not use towel bars as grab bars.  Use non-skid mats or decals in the tub or shower.  If you need to sit down in the shower, use a plastic, non-slip stool.  Keep the floor dry. Clean up any water that spills on the floor as soon as it happens.  Remove soap buildup in the tub or shower regularly.  Attach bath mats securely with double-sided non-slip rug tape.  Do not have throw rugs and other things on the floor that can make you trip. What can I do in the bedroom?  Use night lights.  Make sure that you have  a light by your bed that is easy to reach.  Do not use any sheets or blankets that are too big for your bed. They should not hang down onto the floor.  Have a firm chair that has side arms. You can use this for support while you get dressed.  Do not have throw rugs and other things on the floor that can make you trip. What can I do in the kitchen?  Clean up any spills right away.  Avoid walking on wet floors.  Keep items that you use a lot in easy-to-reach places.  If you need to reach something above you, use a strong step stool that has a grab bar.  Keep electrical cords out of the way.  Do not use floor polish or wax that makes floors slippery. If you must use wax, use non-skid floor wax.  Do not have throw rugs and other things on the floor that can make you trip. What can I do with my stairs?  Do not leave any items on the stairs.  Make sure that there are handrails on both sides of the stairs and use them.  Fix handrails that are broken or loose. Make sure that handrails are as long as the stairways.  Check any carpeting to make sure that it is firmly attached to the stairs. Fix any carpet that is loose or worn.  Avoid having throw rugs at the top or bottom of the stairs. If you do have throw rugs, attach them to the floor with carpet tape.  Make sure that you have a light switch at the top of the stairs and the bottom of the stairs. If you do not have them, ask someone to add them for you. What else can I do to help prevent falls?  Wear shoes that:  Do not have high heels.  Have rubber bottoms.  Are comfortable and fit you well.  Are closed at the toe. Do not wear sandals.  If you use a stepladder:  Make sure that it is fully opened. Do not climb a closed stepladder.  Make sure that both sides of the stepladder are locked into place.  Ask someone to hold it for you, if possible.  Clearly mark and make sure that you can see:  Any grab bars or handrails.  First and last steps.  Where the edge of each step is.  Use tools that help you move around (mobility aids) if they are needed. These include:  Canes.  Walkers.  Scooters.  Crutches.  Turn on the lights when you go into a dark area. Replace any light bulbs as soon as they burn out.  Set up your furniture so you have a clear path. Avoid moving your furniture around.  If any of your floors are uneven, fix them.  If there are any pets around you, be aware of where they are.  Review your medicines with your doctor. Some medicines can make you feel dizzy. This can increase your chance of falling. Ask your doctor what other things that you can do to help prevent falls. This information is not intended to replace advice given to you by your health care provider. Make sure you discuss any questions you have with your health care provider. Document Released: 08/24/2009 Document Revised: 04/04/2016 Document Reviewed:  12/02/2014 Elsevier Interactive Patient Education  2017 [employer].

## 2017-02-12 NOTE — Progress Notes (Signed)
Subjective:   Ashley Armstrong is a 51 y.o. female who presents for an Initial Medicare Annual Wellness Visit.  Review of Systems    Cardiac Risk Factors include: diabetes mellitus;dyslipidemia;sedentary lifestyle     Objective:    Today's Vitals   02/12/17 0917  BP: 120/70  Pulse: 80  Temp: 98.6 F (37 C)  TempSrc: Oral  SpO2: 96%  Weight: 128 lb 1.3 oz (58.1 kg)  Height: 5\' 8"  (1.727 m)   Body mass index is 19.47 kg/m.   Current Medications (verified) Outpatient Encounter Prescriptions as of 02/12/2017  Medication Sig  . azelastine (ASTELIN) 0.1 % nasal spray Place 2 sprays into both nostrils 2 (two) times daily. Use in each nostril as directed (Patient taking differently: Place 2 sprays into both nostrils 2 (two) times daily as needed for allergies. Use in each nostril as directed)  . BESIVANCE 0.6 % SUSP Apply 1 drop to eye 3 (three) times daily.  . diazepam (VALIUM) 10 MG tablet Take 10 mg by mouth daily as needed for anxiety.   . DULoxetine (CYMBALTA) 60 MG capsule TAKE ONE CAPSULE BY MOUTH DAILY  . DUREZOL 0.05 % EMUL Apply 1 drop to eye daily at 8 pm.  . glucose blood (ONE TOUCH ULTRA TEST) test strip Use as instructed 4 times daily dx e11.65  . insulin aspart (NOVOLOG FLEXPEN) 100 UNIT/ML FlexPen INJECT 10 TO 20 UNITS THREE TIMES A DAY WITH MEALS (Patient taking differently: INJECT 10 TO 30 UNITS THREE TIMES A DAY WITH MEALS)  . insulin glargine (LANTUS) 100 unit/mL SOPN Inject 90 units into the skin daily  . Insulin Pen Needle 32G X 4 MM MISC For use with insulin four times daily DX E11.29  . linaclotide (LINZESS) 72 MCG capsule 1 PO 30 mins WITH your first meal  . lisinopril (PRINIVIL,ZESTRIL) 2.5 MG tablet TAKE ONE (1) TABLET EACH DAY  . mirtazapine (REMERON) 30 MG tablet Take 1 tablet by mouth daily at 8 pm.  . mometasone (NASONEX) 50 MCG/ACT nasal spray Place 2 sprays into the nose daily. (Patient taking differently: Place 2 sprays into the nose daily as needed  (Allergies). )  . montelukast (SINGULAIR) 10 MG tablet TAKE ONE TABLET BY MOUTH EVERY NIGHT AT BEDTIME  . omeprazole (PRILOSEC) 40 MG capsule TAKE ONE CAPSULE BY MOUTH 30 MINUTES PRIOR TO BREAKFAST AND SUPPER  . ondansetron (ZOFRAN) 4 MG tablet TAKE ONE TABLET BY MOUTH EVERY EIGHT HOURS AS NEEDED FOR NAUSEA OR VOMITING  . pravastatin (PRAVACHOL) 80 MG tablet TAKE 1 TABLET BY MOUTH IN THE EVENING  . PROLENSA 0.07 % SOLN Apply 1 drop to eye 2 (two) times daily.  . [DISCONTINUED] azithromycin (ZITHROMAX) 250 MG tablet Two tablets on day one, then one tablet once daily fro an additional 4 days  . [DISCONTINUED] ondansetron (ZOFRAN) 4 MG tablet TAKE ONE TABLET EVERY 8 HOURS AS NEEDED FOR NAUSEA OR VOMITING   No facility-administered encounter medications on file as of 02/12/2017.     Allergies (verified) Patient has no known allergies.   History: Past Medical History:  Diagnosis Date  . Anxiety   . Dyslipidemia   . Gastroparesis due to DM ([place]) OCT 2014   75% AT 2 HRS, GLU >   . Headache(784.0)   . IDDM (insulin dependent diabetes mellitus) ([place])   . Neuropathy ([place])   . Nicotine addiction   . TIA (transient ischemic attack)    Dr. Merlene Laughter   Past Surgical History:  Procedure Laterality Date  .  Cataract surgery Bilateral 01/20/2017, 02/03/2017  . COLONOSCOPY N/A 05/09/2016   Procedure: COLONOSCOPY;  Surgeon: Danie Binder, MD;  [facility];  Service: Endoscopy;  Laterality: N/A;  130  . ESOPHAGOGASTRODUODENOSCOPY N/A 08/31/2013   SLF: 1. Earky satiety nausea may be due to Gastroparesis/pyloric channel stenosis. 2. small hiatal hernia 3. Moderate erosive gastritis.   Marland Kitchen laser surgery bilateral eye     Family History  Problem Relation Age of Onset  . Hypertension Mother   . Diabetes Mother   . Hyperlipidemia Mother   . Rosacea Mother   . Hypertension Father   . Diabetes Brother   . Colon cancer Neg Hx    Social History   Occupational History  . Not on file.    Social History Main Topics  . Smoking status: Former Smoker    Packs/day: 1.50    Years: 24.00    Types: Cigarettes    Quit date: 08/24/2011  . Smokeless tobacco: Never Used  . Alcohol use No  . Drug use: No  . Sexual activity: Not Currently    Birth control/ protection: None    Tobacco Counseling Counseling given: Not Answered   Activities of Daily Living In your present state of health, do you have any difficulty performing the following activities: 02/12/2017 05/23/2016  Hearing? N N  Vision? Y N  Difficulty concentrating or making decisions? Y N  Walking or climbing stairs? Y N  Dressing or bathing? Y N  Doing errands, shopping? N N  Preparing Food and eating ? N -  Using the Toilet? N -  In the past six months, have you accidently leaked urine? N -  Do you have problems with loss of bowel control? N -  Managing your Medications? N -  Managing your Finances? N -  Housekeeping or managing your Housekeeping? N -  Some recent data might be hidden    Immunizations and Health Maintenance Immunization History  Administered Date(s) Administered  . H1N1 10/24/2008  . Influenza Whole 09/24/2007, 08/09/2010  . Influenza,inj,Quad PF,36+ Mos 07/29/2013, 10/18/2014, 08/29/2015, 09/23/2016  . Pneumococcal Polysaccharide-23 03/19/2013  . Td 04/11/2004  . Tdap 06/16/2014   Health Maintenance Due  Topic Date Due  . PAP SMEAR  07/29/2016    Patient Care Team: Fayrene Helper, MD as PCP - General Danie Binder, MD as Consulting Physician (Gastroenterology) Sherlynn Stalls, MD as Consulting Physician (Ophthalmology) Darleen Crocker, MD as Consulting Physician (Ophthalmology) Herminio Commons, MD as Attending Physician (Cardiology) Landis Martins, DPM as Consulting Physician (Podiatry)  Indicate any recent Medical Services you may have received from other than [facility] providers in the past year (date may be approximate).     Assessment:   This is a routine wellness  examination for [name].  Hearing/Vision screen No exam data present  Dietary issues and exercise activities discussed: Current Exercise Habits: The patient does not participate in regular exercise at present, Exercise limited by: None identified  Goals    . Exercise 3x per week (30 min per time)          Recommend starting a routine exercise program at least 3 days a week for 30-45 minutes at a time as tolerated.        Depression Screen PHQ 2/9 Scores 02/12/2017 09/23/2016 07/27/2015 06/16/2014  PHQ - 2 Score 4 5 6 5   PHQ- 9 Score 7 21 22 18     Fall Risk Fall Risk  02/12/2017 07/27/2015  Falls in the past year? No  No    Cognitive Function: Normal   6CIT Screen 02/12/2017  What Year? 0 points  What month? 0 points  What time? 0 points  Count back from 20 0 points  Months in reverse 0 points  Repeat phrase 0 points  Total Score 0    Screening Tests Health Maintenance  Topic Date Due  . PAP SMEAR  07/29/2016  . OPHTHALMOLOGY EXAM  04/16/2017 (Originally 07/09/2015)  . INFLUENZA VACCINE  06/11/2017  . HEMOGLOBIN A1C  07/18/2017  . FOOT EXAM  09/23/2017  . PNEUMOCOCCAL POLYSACCHARIDE VACCINE (2) 03/19/2018  . TETANUS/TDAP  06/16/2024  . HIV Screening  Completed      Plan:  I have personally reviewed and addressed the Medicare Annual Wellness questionnaire and have noted the following in the patient's chart:  A. Medical and social history B. Use of alcohol, tobacco or illicit drugs  C. Current medications and supplements D. Functional ability and status E.  Nutritional status F.  Physical activity G. Advance directives H. List of other physicians I.  Hospitalizations, surgeries, and ER visits in previous 12 months J.  [place] to include cognitive, depression, and falls L. Referrals and appointments - Mammogram ordered today, patient will call and schedule.  In addition, I have reviewed and discussed with patient certain preventive protocols, quality  metrics, and best practice recommendations. A written personalized care plan for preventive services as well as general preventive health recommendations were provided to patient.  Signed,   Stormy Fabian, LPN Lead Nurse Health Advisor

## 2017-02-12 NOTE — Progress Notes (Signed)
Dr. Merrily Pew is requesting you send a referral to a gynecologist in [place] so she can get a pap smear.

## 2017-02-18 ENCOUNTER — Other Ambulatory Visit: Payer: Self-pay | Admitting: Family Medicine

## 2017-02-18 ENCOUNTER — Ambulatory Visit: Payer: PPO | Admitting: Sports Medicine

## 2017-02-19 ENCOUNTER — Telehealth: Payer: Self-pay | Admitting: Family Medicine

## 2017-02-19 ENCOUNTER — Other Ambulatory Visit: Payer: Self-pay

## 2017-02-19 MED ORDER — GLUCOSE BLOOD VI STRP: ORAL_STRIP | 5 refills | Status: DC

## 2017-02-19 MED ORDER — ONETOUCH DELICA LANCETS 33G MISC: 5 refills | Status: DC

## 2017-02-19 NOTE — Telephone Encounter (Signed)
Ashley Armstrong is asking if her Lancets and Test Strips could be called in to the pharmacy, please advise?

## 2017-02-19 NOTE — Telephone Encounter (Signed)
Refill sent.

## 2017-02-19 NOTE — Progress Notes (Signed)
delica

## 2017-02-24 ENCOUNTER — Encounter (HOSPITAL_COMMUNITY): Payer: Medicare Other

## 2017-02-24 ENCOUNTER — Other Ambulatory Visit (HOSPITAL_COMMUNITY): Payer: Medicare Other

## 2017-03-03 ENCOUNTER — Encounter (HOSPITAL_COMMUNITY): Payer: Medicare Other

## 2017-03-03 DIAGNOSIS — F410 Panic disorder [episodic paroxysmal anxiety] without agoraphobia: Secondary | ICD-10-CM | POA: Diagnosis not present

## 2017-03-04 ENCOUNTER — Ambulatory Visit: Payer: PPO | Admitting: Sports Medicine

## 2017-03-05 DIAGNOSIS — E10311 Type 1 diabetes mellitus with unspecified diabetic retinopathy with macular edema: Secondary | ICD-10-CM | POA: Diagnosis not present

## 2017-03-06 ENCOUNTER — Encounter (HOSPITAL_COMMUNITY): Admission: RE | Admit: 2017-03-06 | Payer: PPO | Source: Ambulatory Visit

## 2017-03-06 ENCOUNTER — Inpatient Hospital Stay (HOSPITAL_COMMUNITY): Admission: RE | Admit: 2017-03-06 | Payer: Medicare Other | Source: Ambulatory Visit

## 2017-03-07 DIAGNOSIS — H3562 Retinal hemorrhage, left eye: Secondary | ICD-10-CM | POA: Diagnosis not present

## 2017-03-07 DIAGNOSIS — E113591 Type 2 diabetes mellitus with proliferative diabetic retinopathy without macular edema, right eye: Secondary | ICD-10-CM | POA: Diagnosis not present

## 2017-03-07 DIAGNOSIS — E113512 Type 2 diabetes mellitus with proliferative diabetic retinopathy with macular edema, left eye: Secondary | ICD-10-CM | POA: Diagnosis not present

## 2017-03-07 DIAGNOSIS — H35373 Puckering of macula, bilateral: Secondary | ICD-10-CM | POA: Diagnosis not present

## 2017-03-12 DIAGNOSIS — E113512 Type 2 diabetes mellitus with proliferative diabetic retinopathy with macular edema, left eye: Secondary | ICD-10-CM | POA: Diagnosis not present

## 2017-03-25 ENCOUNTER — Ambulatory Visit: Payer: PPO | Admitting: Cardiovascular Disease

## 2017-03-26 ENCOUNTER — Encounter: Payer: Self-pay | Admitting: Gastroenterology

## 2017-03-26 ENCOUNTER — Ambulatory Visit (INDEPENDENT_AMBULATORY_CARE_PROVIDER_SITE_OTHER): Payer: PPO | Admitting: Gastroenterology

## 2017-03-26 DIAGNOSIS — K3184 Gastroparesis: Secondary | ICD-10-CM | POA: Diagnosis not present

## 2017-03-26 DIAGNOSIS — K5901 Slow transit constipation: Secondary | ICD-10-CM

## 2017-03-26 DIAGNOSIS — K219 Gastro-esophageal reflux disease without esophagitis: Secondary | ICD-10-CM

## 2017-03-26 MED ORDER — ONDANSETRON HCL 4 MG PO TABS: 4.0000 mg | ORAL_TABLET | Freq: Three times a day (TID) | ORAL | 3 refills | Status: DC | PRN

## 2017-03-26 MED ORDER — METOCLOPRAMIDE HCL 5 MG PO TABS: ORAL_TABLET | ORAL | 3 refills | Status: DC

## 2017-03-26 NOTE — Assessment & Plan Note (Signed)
SYMPTOMS CONTROLLED/RESOLVED.  CONTINUE OMEPRAZOLE.  TAKE 30 MINUTES PRIOR TO YOUR MEALS TWICE DAILY. CONTINUE TO MONITOR SYMPTOMS. 

## 2017-03-26 NOTE — Assessment & Plan Note (Signed)
SYMPTOMS NOT IDEALLY CONTROLLED DUE TO DIETARY CHOICES.  DRINK WATER TO KEEP YOUR URINE LIGHT YELLOW. ADD REGLAN BEFORE MEALS if needed THREE TIMES A DAY AND AT BEDTIME to relieve nausea. USE ZOFRAN if NEEDED FOR NAUSEA OR VOMITING. ENCOURAGED PT TO FOLLOW A GASTROPARESIS DIET: STEP 1 FOR SEVERE NAUSEA AND VOMITING, STEP 2 FOR MODERATE NAUSEA OR VOMITING STEP 3 WITH MILD NAUSEA OR VOMITING.  HANDOUT GIVEN. CALL WITH QUESTIONS OR CONCERNS.  FOLLOW UP IN 4 MOS.

## 2017-03-26 NOTE — Assessment & Plan Note (Signed)
SYMPTOMS NOT IDEALLY CONTROLLED AT TIMES ON LINZESS 72 MCG TABLETS.  DRINK WATER TO KEEP YOUR URINE LIGHT YELLOW. IF HAS A #1 STOOL, OPEN LINZESS CAPSULE(145 MCG). PLACE GRANULES IN 4 TEASPOONS OF WATER. STIR IT FOR 30 SECONDS. TAKE 3 TSP OF THE WATER DAILY.  YOU DO NOT NEED TO TAKE THE GRANULES THE MEDICINE IS IN THE WATER.  IF HAVING A #5 STOOL, USE LINZESS 72 MCG TABLETS. CALL WITH QUESTIONS OR CONCERNS.  FOLLOW UP IN 4 MOS.

## 2017-03-26 NOTE — Progress Notes (Signed)
Subjective:    Patient ID: Ashley Armstrong, female    DOB: 15-Mar-1966, 51 y.o.   MRN: [id number]  Fayrene Helper, MD  HPI FEELS LIKE SUGAR GOES DOWN AFTER EATING. HAS TROUBLE WITH #1 AND #5. FELT BETTER AFTER EGD/PYLORIC DILATION. HEARTBURN: NOT FLARING AT THIS POINT. MAY EAT-ORANGES, APPLES, BAKED FISH, GREEN BEANS, HAMBURGER RARE, EGGS TOAST BACON TOMATOES. DRINKING ENSURE PLUS. GAINED 4 LBS SINCE LAST YEAR. FEELING LIKE SHE BLOATED AND FULL OF GAS. FOLLOWING GASTROPARESIS MOST OF THE TIME BUT WHEN SHE FLARES SHE FOLLOWS IT MORE CLOSELY. RARE NAUSEA. RARE SEEING BLOOD AFTER WIPING AMOUNT(#1 BM). STAYS SWOLLEN IN LYMPH NODES A LOT.  PT DENIES FEVER, CHILLS, vomiting, melena, diarrhea, CHEST PAIN, SHORTNESS OF BREATH,  CHANGE IN BOWEL IN HABITS, abdominal pain, problems swallowing, OR heartburn or indigestion.   Past Medical History:  Diagnosis Date  . Anxiety   . Dyslipidemia   . Gastroparesis due to DM ([place]) OCT 2014   75% AT 2 HRS, GLU >   . Headache(784.0)   . IDDM (insulin dependent diabetes mellitus) ([place])   . Neuropathy   . Nicotine addiction   . TIA (transient ischemic attack)    Dr. Merlene Laughter    Past Surgical History:  Procedure Laterality Date  . Cataract surgery Bilateral 01/20/2017, 02/03/2017  . COLONOSCOPY N/A 05/09/2016   Procedure: COLONOSCOPY;  Surgeon: Danie Binder, MD;  [facility];  Service: Endoscopy;  Laterality: N/A;  130  . ESOPHAGOGASTRODUODENOSCOPY N/A 08/31/2013   SLF: 1. Earky satiety nausea may be due to Gastroparesis/pyloric channel stenosis. 2. small hiatal hernia 3. Moderate erosive gastritis.   Marland Kitchen laser surgery bilateral eye     No Known Allergies  Current Outpatient Prescriptions  Medication Sig Dispense Refill  . azelastine (ASTELIN) 0.1 % nasal spray Place 2 sprays BID    . BESIVANCE 0.6 % SUSP Apply 1 drop to eye 3 (three) times daily.    .      . diazepam 10 MG tablet Take QD PRN     . DULoxetine  60 MG capsule TAKE ONE  DAILY    . DUREZOL 0.05 % EMUL Apply 1 drop to eye daily at 8 pm.    .      . iNOVOLOG FLEXPEN 100 UNIT/ML FlexPen INJECT 10 TO 20 UNITS THREE TIMES A DAY WITH MEALS     . insulin glargine (LANTUS) 100 unit/mL SOPN Inject 90 units into the skin daily    .      . linaclotide (LINZESS) 72 MCG capsule 1 PO 30 mins WITH your first meal    . lisinopril (PRINIVIL,ZESTRIL) 2.5 MG tablet TAKE ONE (1) TABLET EACH DAY    . mirtazapine (REMERON) 30 MG tablet Take 1 tablet by mouth daily at 8 pm.    . mometasone (NASONEX) 50 MCG/ACT nasal spray Place 2 sprays into the nose daily.     . montelukast (SINGULAIR) 10 MG tablet TAKE ONE TABLET BY MOUTH EVERY NIGHT AT BEDTIME    . omeprazole (PRILOSEC) 40 MG capsule TAKE ONE 30 MINS PRIOR TO BREAKFAST AND SUPPER    . ZOFRAN 4 MG tablet TAKE ONE Q8H PRN  DAILY TO NONE   .      Marland Kitchen      Marland Kitchen PRAVACHOL 80 MG tablet TAKE 1 TABLET BY MOUTH IN THE EVENING    . PROLENSA 0.07 % SOLN Apply 1 drop to eye 2 (two) times daily.     Review of  Systems PER HPI OTHERWISE ALL SYSTEMS ARE NEGATIVE. C/O R NECK PAIN. JUST HAD CATARACT SURGERY    Objective:   Physical Exam  Constitutional: She is oriented to person, place, and time. She appears well-developed and well-nourished. No distress.  HENT:  Head: Normocephalic and atraumatic.  Mouth/Throat: Oropharynx is clear and moist. No oropharyngeal exudate.  Eyes: Pupils are equal, round, and reactive to light. No scleral icterus.  Neck: Normal range of motion. Neck supple.  Cardiovascular: Normal rate, regular rhythm and normal heart sounds.   Pulmonary/Chest: Effort normal and breath sounds normal. No respiratory distress.  Abdominal: Soft. Bowel sounds are normal. She exhibits distension (MILD). There is no tenderness.  Musculoskeletal: She exhibits no edema.  Lymphadenopathy:    She has no cervical adenopathy.  Neurological: She is alert and oriented to person, place, and time.  NO  NEW FOCAL DEFICITS  Psychiatric: She has  a normal mood and affect.  Vitals reviewed.     Assessment & Plan:

## 2017-03-26 NOTE — Patient Instructions (Signed)
DRINK WATER TO KEEP YOUR URINE LIGHT YELLOW.  TAKE REGLAN BEFORE MEALS if needed THREE TIMES A DAY AND AT BEDTIME to relieve nausea.  USE ZOFRAN if NEEDED FOR NAUSEA OR VOMITING.  FOLLOW A GASTROPARESIS DIET: STEP 1 FOR SEVERE NAUSEA AND VOMITING, STEP 2 FOR MODERATE NAUSEA OR VOMITING STEP 3 WITH MILD NAUSEA OR VOMITING. SEE HANDOUT.  IF YOU HAVE A #1 STOOL, OPEN LINZESS CAPSULE(145 MCG). PLACE GRANULES IN 4 TEASPOONS OF WATER. STIR IT FOR 30 SECONDS. TAKE 3 TSP OF THE WATER DAILY.  YOU DO NOT NEED TO TAKE THE GRANULES THE MEDICINE IS IN THE WATER.   IF YOU ARE HAVING A #5 STOOL, USE LINZESS 72 MCG TABLETS.  PLEASE CALL WITH QUESTIONS OR CONCERNS.  FOLLOW UP IN 4 MOS.    Gastroparesis  Gastroparesis is also called slowed stomach emptying (delayed gastric emptying). It is a condition in which the stomach takes too long to empty its contents.   CAUSES  Gastroparesis happens when nerves to the stomach are damaged or stop working. When the nerves are damaged, the muscles of the stomach and intestines do not work normally. The movement of food is slowed or stopped.   SYMPTOMS   Heartburn.   Feeling sick to your stomach (nausea).   Vomiting of undigested food.   An early feeling of fullness when eating.   Weight loss.   Abdominal bloating.   Erratic blood glucose levels.   Lack of appetite.   Gastroesophageal reflux.   Spasms of the stomach wall.    TREATMENT   Treatments include:   Exercise.   Medicines to control nausea and vomiting: ZOFRAN.   Medicines to stimulate stomach muscles: REGLAN.  Changes in what and when you eat. FOLLOW A GASTROPARESIS DIET.   EAT 4 TO 6 SMALL meals EVERY DAY.   Eating low-fiber forms of high-fiber foods, such as eating cooked vegetables instead of raw vegetables.   Eating low-fat foods.   Consuming liquids, which are easier to digest.    It is important to note that in most cases, treatment does not cure gastroparesis. It  is usually a lasting (chronic) condition. Treatment helps you manage the condition so that you can be as healthy and comfortable as possible.

## 2017-03-26 NOTE — Progress Notes (Signed)
cc'ed to pcp °

## 2017-03-31 ENCOUNTER — Inpatient Hospital Stay (HOSPITAL_COMMUNITY): Admission: RE | Admit: 2017-03-31 | Payer: PPO | Source: Ambulatory Visit

## 2017-03-31 ENCOUNTER — Encounter (HOSPITAL_COMMUNITY): Payer: PPO

## 2017-03-31 NOTE — Progress Notes (Signed)
ON RECALL  °

## 2017-04-01 ENCOUNTER — Telehealth: Payer: Self-pay | Admitting: *Deleted

## 2017-04-01 ENCOUNTER — Ambulatory Visit (INDEPENDENT_AMBULATORY_CARE_PROVIDER_SITE_OTHER): Payer: PPO

## 2017-04-01 ENCOUNTER — Ambulatory Visit (INDEPENDENT_AMBULATORY_CARE_PROVIDER_SITE_OTHER): Payer: PPO | Admitting: Sports Medicine

## 2017-04-01 DIAGNOSIS — S92501D Displaced unspecified fracture of right lesser toe(s), subsequent encounter for fracture with routine healing: Secondary | ICD-10-CM | POA: Diagnosis not present

## 2017-04-01 DIAGNOSIS — E1142 Type 2 diabetes mellitus with diabetic polyneuropathy: Secondary | ICD-10-CM | POA: Diagnosis not present

## 2017-04-01 DIAGNOSIS — B351 Tinea unguium: Secondary | ICD-10-CM | POA: Diagnosis not present

## 2017-04-01 DIAGNOSIS — M79676 Pain in unspecified toe(s): Secondary | ICD-10-CM | POA: Diagnosis not present

## 2017-04-01 DIAGNOSIS — S92511K Displaced fracture of proximal phalanx of right lesser toe(s), subsequent encounter for fracture with nonunion: Secondary | ICD-10-CM

## 2017-04-01 DIAGNOSIS — M79671 Pain in right foot: Secondary | ICD-10-CM

## 2017-04-01 NOTE — Telephone Encounter (Addendum)
-----   Message from Landis Martins, [place] sent at 04/01/2017 10:33 AM EDT ----- Regarding: right 2nd toe fracture Nonunion 2nd toe fracture on right. Dr. Cannon Kettle order Exogen Bone Growth Stimulator. I ordered the Exogen Bone Growth stimulator. [place] picked up demographics and clinicals.04/03/2017-Trey - Exogen picked up LOV 04/01/2017.

## 2017-04-01 NOTE — Progress Notes (Signed)
Subjective: Ashley Armstrong is a 51 y.o. Diabetic female patient who returns to office for evaluation of Right foot pain and swelling secondary to fracture of 2nd toe and for diabetic nail trim. Patient admits death in her family. Patient denies any other pedal complaints.   Patient Active Problem List   Diagnosis Date Noted  . Rib pain on left side 01/15/2017  . Atypical chest pain 01/12/2017  . Gallbladder polyp 12/13/2016  . Shoulder pain, right 09/23/2016  . GERD (gastroesophageal reflux disease) 09/05/2016  . Constipation 10/12/2015  . Seasonal allergies 11/09/2014  . Depression with anxiety 06/18/2014  . Tight fascia 01/17/2014  . Degenerative disc disease, cervical 12/07/2013  . Thoracic back pain 07/31/2013  . CTS (carpal tunnel syndrome) 07/08/2013  . Polyneuropathy 07/08/2013  . Diabetic nephropathy ([place]) 06/23/2013  . TIA (transient ischemic attack) 04/28/2013  . Tennis elbow syndrome 04/15/2013  . Rotator cuff syndrome of left shoulder 04/15/2013  . Vitamin D deficiency 03/20/2013  . Onychomycosis of toenail 03/20/2013  . Acute sinusitis 11/21/2009  . DIABETES MELLITUS, WITH RENAL COMPLICATIONS 00/93/8182  . Gastroparesis 06/16/2008  . Diabetes mellitus, insulin dependent (IDDM), uncontrolled ([place]) 04/13/2008  . Hyperlipidemia LDL goal <100 04/13/2008  . Generalized anxiety disorder 04/13/2008    Current Outpatient Prescriptions on File Prior to Visit  Medication Sig Dispense Refill  . azelastine (ASTELIN) 0.1 % nasal spray Place 2 sprays into both nostrils 2 (two) times daily. Use in each nostril as directed (Patient taking differently: Place 2 sprays into both nostrils 2 (two) times daily as needed for allergies. Use in each nostril as directed) 30 mL 12  . BESIVANCE 0.6 % SUSP Apply 1 drop to eye 3 (three) times daily.    . Blood Glucose Monitoring Suppl (ONE TOUCH ULTRA MINI) w/Device KIT USE DAILY AS DIRECTED. 1 each 0  . diazepam (VALIUM) 10 MG tablet Take 10  mg by mouth daily as needed for anxiety.     . DULoxetine (CYMBALTA) 60 MG capsule TAKE ONE CAPSULE BY MOUTH DAILY 90 capsule 1  . DUREZOL 0.05 % EMUL Apply 1 drop to eye daily at 8 pm.    . glucose blood (ONE TOUCH ULTRA TEST) test strip Use as instructed 4 times daily dx e11.65 150 each 5  . insulin aspart (NOVOLOG FLEXPEN) 100 UNIT/ML FlexPen INJECT 10 TO 20 UNITS THREE TIMES A DAY WITH MEALS (Patient taking differently: INJECT 10 TO 30 UNITS THREE TIMES A DAY WITH MEALS) 15 mL 5  . insulin glargine (LANTUS) 100 unit/mL SOPN Inject 90 units into the skin daily 15 mL 11  . Insulin Pen Needle 32G X 4 MM MISC For use with insulin four times daily DX E11.29 150 each 5  . linaclotide (LINZESS) 72 MCG capsule 1 PO 30 mins WITH your first meal 30 capsule 5  . lisinopril (PRINIVIL,ZESTRIL) 2.5 MG tablet TAKE ONE (1) TABLET EACH DAY 90 tablet 1  . metoCLOPramide (REGLAN) 5 MG tablet 1-2 po q6h If needed for nausea or vomiting 60 tablet 3  . mirtazapine (REMERON) 30 MG tablet Take 1 tablet by mouth daily at 8 pm.    . mometasone (NASONEX) 50 MCG/ACT nasal spray Place 2 sprays into the nose daily. (Patient taking differently: Place 2 sprays into the nose daily as needed (Allergies). ) 17 g 12  . montelukast (SINGULAIR) 10 MG tablet TAKE ONE TABLET BY MOUTH EVERY NIGHT AT BEDTIME 90 tablet 1  . omeprazole (PRILOSEC) 40 MG capsule TAKE ONE CAPSULE BY  MOUTH 30 MINUTES PRIOR TO BREAKFAST AND SUPPER 180 capsule 3  . ondansetron (ZOFRAN) 4 MG tablet Take 1 tablet (4 mg total) by mouth every 8 (eight) hours as needed for nausea or vomiting. 60 tablet 3  . ONETOUCH DELICA LANCETS 19C MISC USE FOUR TIMES A DAY AS DIRECTED. 100 each 11  . ONETOUCH DELICA LANCETS 02C MISC Test four times daily dx e11.65 150 each 5  . pravastatin (PRAVACHOL) 80 MG tablet TAKE 1 TABLET BY MOUTH IN THE EVENING 90 tablet 1  . PROLENSA 0.07 % SOLN Apply 1 drop to eye 2 (two) times daily.     No current facility-administered medications  on file prior to visit.     No Known Allergies  Objective:  General: Alert and oriented x3 in no acute distress  Dermatology: No open lesions bilateral lower extremities, no webspace macerations, no ecchymosis bilateral, all nails x 10 are elongated and minimally dystrophic.   Vascular: Focal edema noted to right 2nd toe. Dorsalis Pedis and Posterior Tibial pedal pulses 1/4, Capillary Fill Time 3 seconds,(-) pedal hair growth bilateral, Temperature gradient within normal limits.  Neurology: Gross sensation intact via light touch bilateral, Protective sensation absent with Thornell Mule Monofilament to all pedal sites, No babinski sign present bilateral. (- )Tinels sign right foot.   Musculoskeletal: There is tenderness with palpation at 2nd toe on right, Varus lesser hammertoes. No pain with calf compression bilateral. All joint range of motion is within normal limits except at 2nd toe on right where there is pain, Strength within normal limits in all groups bilateral.   Gait: Unassisted, Antalgic gait  Xrays  Right Foot   Impression: Closed nondisplaced fracture at head of proximal phalanx of 2nd toe on right foot with 1 mm fracture gap.     Assessment and Plan: Problem List Items Addressed This Visit    None    Visit Diagnoses    Closed displaced fracture of proximal phalanx of lesser toe of right foot with nonunion, subsequent encounter    -  Primary   Relevant Orders   DG Foot Complete Right (Completed)   Right foot pain       Diabetic polyneuropathy associated with type 2 diabetes mellitus (HCC)       Dermatophytosis of nail           -Complete examination performed -Mechanically debrided nails using sterile nail nipper without incident  -Xrays reviewed -Discussed treatement options for fracture; risks, alternatives, and benefits explained. -Rx Exogen bone strim -Recommend continue with post op shoe to patient to wear at all times  -Recommend protection, rest,  elevation daily until symptoms improve -Patient to return to office in 4 weeks for serial x-rays to assess healing  or sooner if condition worsens.   Landis Martins, DPM

## 2017-04-02 DIAGNOSIS — H35373 Puckering of macula, bilateral: Secondary | ICD-10-CM | POA: Diagnosis not present

## 2017-04-02 DIAGNOSIS — H4312 Vitreous hemorrhage, left eye: Secondary | ICD-10-CM | POA: Diagnosis not present

## 2017-04-02 DIAGNOSIS — H4321 Crystalline deposits in vitreous body, right eye: Secondary | ICD-10-CM | POA: Diagnosis not present

## 2017-04-02 DIAGNOSIS — E103513 Type 1 diabetes mellitus with proliferative diabetic retinopathy with macular edema, bilateral: Secondary | ICD-10-CM | POA: Diagnosis not present

## 2017-04-15 ENCOUNTER — Ambulatory Visit (INDEPENDENT_AMBULATORY_CARE_PROVIDER_SITE_OTHER): Payer: PPO | Admitting: Family Medicine

## 2017-04-15 ENCOUNTER — Encounter: Payer: Self-pay | Admitting: Family Medicine

## 2017-04-15 VITALS — BP 90/60 | HR 82 | Resp 16 | Ht 68.0 in | Wt 130.0 lb

## 2017-04-15 DIAGNOSIS — E1065 Type 1 diabetes mellitus with hyperglycemia: Secondary | ICD-10-CM

## 2017-04-15 DIAGNOSIS — E785 Hyperlipidemia, unspecified: Secondary | ICD-10-CM

## 2017-04-15 DIAGNOSIS — F418 Other specified anxiety disorders: Secondary | ICD-10-CM

## 2017-04-15 DIAGNOSIS — K219 Gastro-esophageal reflux disease without esophagitis: Secondary | ICD-10-CM

## 2017-04-15 DIAGNOSIS — K3184 Gastroparesis: Secondary | ICD-10-CM

## 2017-04-15 DIAGNOSIS — E1022 Type 1 diabetes mellitus with diabetic chronic kidney disease: Secondary | ICD-10-CM

## 2017-04-15 DIAGNOSIS — E103299 Type 1 diabetes mellitus with mild nonproliferative diabetic retinopathy without macular edema, unspecified eye: Secondary | ICD-10-CM | POA: Diagnosis not present

## 2017-04-15 LAB — COMPLETE METABOLIC PANEL WITH GFR
ALBUMIN: 3.6 g/dL (ref 3.6–5.1)
ALK PHOS: 85 U/L (ref 33–115)
ALT: 18 U/L (ref 6–29)
AST: 21 U/L (ref 10–35)
BILIRUBIN TOTAL: 0.4 mg/dL (ref 0.2–1.2)
BUN: 12 mg/dL (ref 7–25)
CALCIUM: 9.3 mg/dL (ref 8.6–10.2)
CHLORIDE: 104 mmol/L (ref 98–110)
CO2: 30 mmol/L (ref 20–31)
CREATININE: 1.15 mg/dL — AB (ref 0.50–1.10)
GFR, EST AFRICAN AMERICAN: 65 mL/min (ref >=60)
GFR, Est Non African American: 56 mL/min — ABNORMAL LOW (ref >=60)
Glucose, Bld: 172 mg/dL — ABNORMAL HIGH (ref 65–99)
Potassium: 4.6 mmol/L (ref 3.5–5.3)
Sodium: 140 mmol/L (ref 135–146)
Total Protein: 6.4 g/dL (ref 6.1–8.1)

## 2017-04-15 LAB — LIPID PANEL
CHOLESTEROL: 180 mg/dL (ref <200)
HDL: 63 mg/dL (ref >50)
LDL Cholesterol: 87 mg/dL (ref <100)
TRIGLYCERIDES: 148 mg/dL (ref <150)
Total CHOL/HDL Ratio: 2.9 Ratio (ref <5.0)
VLDL: 30 mg/dL (ref <30)

## 2017-04-15 NOTE — Patient Instructions (Addendum)
F/u in 3 months, call if you need me before  Pap is past due call and schedule yu are already referred  pls schedule mammogram    Your lab result will be sent omn my chart  It is important that you exercise regularly at least 30 minutes 7 times a week. If you develop chest pain, have severe difficulty breathing, or feel very tired, stop exercising immediately and seek medical attention    Futiure labs will be mailed

## 2017-04-15 NOTE — Progress Notes (Signed)
REBIE PEALE     MRN: [id number]      DOB: [birth year]   HPI Ms. Kunda is here for follow up and re-evaluation of chronic medical conditions, medication management and review of any available recent lab and radiology data.  Preventive health is updated, specifically  Cancer screening and Immunization.  Still needs pap   Questions or concerns regarding consultations or procedures which the PT has had in the interim are  addressed. The PT denies any adverse reactions to current medications since the last visit.  There are no new concerns.  Denies polyuria, polydipsia, blurred vision , or hypoglycemic episodes. FBG avg 100 ROS Denies recent fever or chills. Denies sinus pressure, nasal congestion, ear pain or sore throat. Denies chest congestion, productive cough or wheezing. Denies chest pains, palpitations and leg swelling Denies abdominal pain, nausea, vomiting,diarrhea or constipation.   Denies dysuria, frequency, hesitancy or incontinence. Denies joint pain, swelling and limitation in mobility. Denies headaches, seizures, numbness, or tingling. Denies depression, anxiety or insomnia. Denies skin break down or rash.   PE  BP 90/60   Pulse 82   Resp 16   Ht 5\' 8"  (1.727 m)   Wt 130 lb (59 kg)   LMP 07/26/2013   SpO2 98%   BMI 19.77 kg/m   Patient alert and oriented and in no cardiopulmonary distress.  HEENT: No facial asymmetry, EOMI,   oropharynx pink and moist.  Neck supple no JVD, no mass.  Chest: Clear to auscultation bilaterally.  CVS: S1, S2 no murmurs, no S3.Regular rate.  ABD: Soft non tender.   Ext: No edema  MS: Adequate ROM spine, shoulders, hips and knees.  Skin: Intact, no ulcerations or rash noted.  Psych: Good eye contact, normal affect. Memory intact not anxious or depressed appearing.  CNS: CN 2-12 intact, power,  normal throughout.no focal deficits noted.   Assessment & Plan  Diabetes mellitus, insulin dependent (IDDM), uncontrolled  ([place]) Deteriorated and uncontrolled, pt refusing referral to endo states she "has too many doctors"  Brought in very good diabetic log for past 3 months, where she is testing 4 times daily , iunfortunately hBa1C has worsend. I again reviwed the recommendation to see specialist, requests one additional 3 months,this is her last attempt Ms. Limes is reminded of the importance of commitment to daily physical activity for 30 minutes or more, as able and the need to limit carbohydrate intake to 30 to 60 grams per meal to help with blood sugar control.   The need to take medication as prescribed, test blood sugar as directed, and to call between visits if there is a concern that blood sugar is uncontrolled is also discussed.   Ms. Space is reminded of the importance of daily foot exam, annual eye examination, and good blood sugar, blood pressure and cholesterol control.  Diabetic Labs Latest Ref Rng & Units 04/15/2017 01/15/2017 09/23/2016 05/21/2016 02/06/2016  HbA1c <5.7 % 9.2(H) 8.8(H) 9.0(H) 8.4(H) 8.1(H)  Microalbumin Not estab mg/dL - - 14.7 - -  Micro/Creat Ratio <30 mcg/mg creat - - 136(H) - -  Chol <200 mg/dL 180 - - 174 -  HDL >50 mg/dL 63 - - 61 -  Calc LDL <100 mg/dL 87 - - 90 -  Triglycerides <150 mg/dL 148 - - 115 -  Creatinine 0.50 - 1.10 mg/dL 1.15(H) 1.07 1.08 1.12(H) 1.17(H)   BP/Weight 04/15/2017 03/26/2017 02/12/2017 02/07/2017 01/15/2017 12/05/5807 07/19/3381  Systolic BP 90 97 505 [phone] 379  Diastolic BP  60 66 70 76 70 80 72  Wt. (Lbs) 130 128.8 128.08 128 126 126 125.4  BMI 19.77 19.58 19.47 19.46 19.16 19.16 19.07   Foot/eye exam completion dates Latest Ref Rng & Units 09/23/2016 06/08/2015  Eye Exam No Retinopathy - -  Foot Form Completion - Done Done        Depression with anxiety Controlled, no change in medication   Gastroparesis Managed by GI and recently started on reglan  GERD (gastroesophageal reflux disease) Controlled, no change in  medication   Hyperlipidemia LDL goal <100 Hyperlipidemia:Low fat diet discussed and encouraged.   Lipid Panel  Lab Results  Component Value Date   CHOL 180 04/15/2017   HDL 63 04/15/2017   LDLCALC 87 04/15/2017   TRIG 148 04/15/2017   CHOLHDL 2.9 04/15/2017   Controlled, no change in medication     DIABETES MELLITUS, WITH RENAL COMPLICATIONS Deteiorated, needs endo but is refusing, one last chance to reverse this is being afforded Ms. Benscoter is reminded of the importance of commitment to daily physical activity for 30 minutes or more, as able and the need to limit carbohydrate intake to 30 to 60 grams per meal to help with blood sugar control.   The need to take medication as prescribed, test blood sugar as directed, and to call between visits if there is a concern that blood sugar is uncontrolled is also discussed.   Ms. Gamino is reminded of the importance of daily foot exam, annual eye examination, and good blood sugar, blood pressure and cholesterol control.  Diabetic Labs Latest Ref Rng & Units 04/15/2017 01/15/2017 09/23/2016 05/21/2016 02/06/2016  HbA1c <5.7 % 9.2(H) 8.8(H) 9.0(H) 8.4(H) 8.1(H)  Microalbumin Not estab mg/dL - - 14.7 - -  Micro/Creat Ratio <30 mcg/mg creat - - 136(H) - -  Chol <200 mg/dL 180 - - 174 -  HDL >50 mg/dL 63 - - 61 -  Calc LDL <100 mg/dL 87 - - 90 -  Triglycerides <150 mg/dL 148 - - 115 -  Creatinine 0.50 - 1.10 mg/dL 1.15(H) 1.07 1.08 1.12(H) 1.17(H)   BP/Weight 04/15/2017 03/26/2017 02/12/2017 02/07/2017 01/15/2017 2/75/1700 11/17/4942  Systolic BP 90 97 967 591 638 466 599  Diastolic BP 60 66 70 76 70 80 72  Wt. (Lbs) 130 128.8 128.08 128 126 126 125.4  BMI 19.77 19.58 19.47 19.46 19.16 19.16 19.07   Foot/eye exam completion dates Latest Ref Rng & Units 09/23/2016 06/08/2015  Eye Exam No Retinopathy - -  Foot Form Completion - Done Done

## 2017-04-16 ENCOUNTER — Encounter: Payer: Self-pay | Admitting: Family Medicine

## 2017-04-16 LAB — HEMOGLOBIN A1C
HEMOGLOBIN A1C: 9.2 % — AB (ref <5.7)
MEAN PLASMA GLUCOSE: 217 mg/dL

## 2017-04-17 NOTE — Assessment & Plan Note (Signed)
Controlled, no change in medication  

## 2017-04-17 NOTE — Assessment & Plan Note (Signed)
Hyperlipidemia:Low fat diet discussed and encouraged.   Lipid Panel  Lab Results  Component Value Date   CHOL 180 04/15/2017   HDL 63 04/15/2017   LDLCALC 87 04/15/2017   TRIG 148 04/15/2017   CHOLHDL 2.9 04/15/2017   Controlled, no change in medication

## 2017-04-17 NOTE — Assessment & Plan Note (Signed)
Deteriorated and uncontrolled, pt refusing referral to endo states she "has too many doctors"  Brought in very good diabetic log for past 3 months, where she is testing 4 times daily , iunfortunately hBa1C has worsend. I again reviwed the recommendation to see specialist, requests one additional 3 months,this is her last attempt Ms. Beilfuss is reminded of the importance of commitment to daily physical activity for 30 minutes or more, as able and the need to limit carbohydrate intake to 30 to 60 grams per meal to help with blood sugar control.   The need to take medication as prescribed, test blood sugar as directed, and to call between visits if there is a concern that blood sugar is uncontrolled is also discussed.   Ms. Dizdarevic is reminded of the importance of daily foot exam, annual eye examination, and good blood sugar, blood pressure and cholesterol control.  Diabetic Labs Latest Ref Rng & Units 04/15/2017 01/15/2017 09/23/2016 05/21/2016 02/06/2016  HbA1c <5.7 % 9.2(H) 8.8(H) 9.0(H) 8.4(H) 8.1(H)  Microalbumin Not estab mg/dL - - 14.7 - -  Micro/Creat Ratio <30 mcg/mg creat - - 136(H) - -  Chol <200 mg/dL 180 - - 174 -  HDL >50 mg/dL 63 - - 61 -  Calc LDL <100 mg/dL 87 - - 90 -  Triglycerides <150 mg/dL 148 - - 115 -  Creatinine 0.50 - 1.10 mg/dL 1.15(H) 1.07 1.08 1.12(H) 1.17(H)   BP/Weight 04/15/2017 03/26/2017 02/12/2017 02/07/2017 01/15/2017 0/37/0488 06/19/1693  Systolic BP 90 97 503 888 280 034 917  Diastolic BP 60 66 70 76 70 80 72  Wt. (Lbs) 130 128.8 128.08 128 126 126 125.4  BMI 19.77 19.58 19.47 19.46 19.16 19.16 19.07   Foot/eye exam completion dates Latest Ref Rng & Units 09/23/2016 06/08/2015  Eye Exam No Retinopathy - -  Foot Form Completion - Done Done

## 2017-04-17 NOTE — Assessment & Plan Note (Signed)
Managed by GI and recently started on reglan

## 2017-04-18 ENCOUNTER — Encounter: Payer: Self-pay | Admitting: Cardiovascular Disease

## 2017-04-18 ENCOUNTER — Ambulatory Visit: Payer: PPO | Admitting: Cardiovascular Disease

## 2017-04-18 NOTE — Assessment & Plan Note (Signed)
Deteiorated, needs endo but is refusing, one last chance to reverse this is being afforded Ashley Armstrong is reminded of the importance of commitment to daily physical activity for 30 minutes or more, as able and the need to limit carbohydrate intake to 30 to 60 grams per meal to help with blood sugar control.   The need to take medication as prescribed, test blood sugar as directed, and to call between visits if there is a concern that blood sugar is uncontrolled is also discussed.   Ashley Armstrong is reminded of the importance of daily foot exam, annual eye examination, and good blood sugar, blood pressure and cholesterol control.  Diabetic Labs Latest Ref Rng & Units 04/15/2017 01/15/2017 09/23/2016 05/21/2016 02/06/2016  HbA1c <5.7 % 9.2(H) 8.8(H) 9.0(H) 8.4(H) 8.1(H)  Microalbumin Not estab mg/dL - - 14.7 - -  Micro/Creat Ratio <30 mcg/mg creat - - 136(H) - -  Chol <200 mg/dL 180 - - 174 -  HDL >50 mg/dL 63 - - 61 -  Calc LDL <100 mg/dL 87 - - 90 -  Triglycerides <150 mg/dL 148 - - 115 -  Creatinine 0.50 - 1.10 mg/dL 1.15(H) 1.07 1.08 1.12(H) 1.17(H)   BP/Weight 04/15/2017 03/26/2017 02/12/2017 02/07/2017 01/15/2017 5/46/2703 5/0/0938  Systolic BP 90 97 [phone] 893  Diastolic BP 60 66 70 76 70 80 72  Wt. (Lbs) 130 128.8 128.08 128 126 126 125.4  BMI 19.77 19.58 19.47 19.46 19.16 19.16 19.07   Foot/eye exam completion dates Latest Ref Rng & Units 09/23/2016 06/08/2015  Eye Exam No Retinopathy - -  Foot Form Completion - Done Done

## 2017-04-21 DIAGNOSIS — H26492 Other secondary cataract, left eye: Secondary | ICD-10-CM | POA: Diagnosis not present

## 2017-04-29 ENCOUNTER — Telehealth: Payer: Self-pay | Admitting: Family Medicine

## 2017-04-29 ENCOUNTER — Encounter: Payer: Self-pay | Admitting: Sports Medicine

## 2017-04-29 ENCOUNTER — Ambulatory Visit (INDEPENDENT_AMBULATORY_CARE_PROVIDER_SITE_OTHER): Payer: PPO

## 2017-04-29 ENCOUNTER — Ambulatory Visit (INDEPENDENT_AMBULATORY_CARE_PROVIDER_SITE_OTHER): Payer: PPO | Admitting: Sports Medicine

## 2017-04-29 DIAGNOSIS — M79671 Pain in right foot: Secondary | ICD-10-CM

## 2017-04-29 DIAGNOSIS — S92511K Displaced fracture of proximal phalanx of right lesser toe(s), subsequent encounter for fracture with nonunion: Secondary | ICD-10-CM

## 2017-04-29 DIAGNOSIS — E1142 Type 2 diabetes mellitus with diabetic polyneuropathy: Secondary | ICD-10-CM

## 2017-04-29 NOTE — Progress Notes (Signed)
Subjective: Ashley Armstrong is a 51 y.o. Diabetic female patient who returns to office for evaluation of Right foot pain and swelling secondary to fracture of 2nd toe. Patient admits that there is still swelling to toe however pain is better. Admits ocassional heel pain on right but nothing to bothersome. Patient denies any other pedal complaints.   Patient Active Problem List   Diagnosis Date Noted  . Gallbladder polyp 12/13/2016  . GERD (gastroesophageal reflux disease) 09/05/2016  . Constipation 10/12/2015  . Seasonal allergies 11/09/2014  . Depression with anxiety 06/18/2014  . Tight fascia 01/17/2014  . Degenerative disc disease, cervical 12/07/2013  . CTS (carpal tunnel syndrome) 07/08/2013  . Polyneuropathy 07/08/2013  . Diabetic nephropathy ([place]) 06/23/2013  . Tennis elbow syndrome 04/15/2013  . Rotator cuff syndrome of left shoulder 04/15/2013  . Vitamin D deficiency 03/20/2013  . Onychomycosis of toenail 03/20/2013  . DIABETES MELLITUS, WITH RENAL COMPLICATIONS 27/05/8674  . Gastroparesis 06/16/2008  . Hyperlipidemia LDL goal <100 04/13/2008  . Generalized anxiety disorder 04/13/2008    Current Outpatient Prescriptions on File Prior to Visit  Medication Sig Dispense Refill  . azelastine (ASTELIN) 0.1 % nasal spray Place 2 sprays into both nostrils 2 (two) times daily. Use in each nostril as directed (Patient taking differently: Place 2 sprays into both nostrils 2 (two) times daily as needed for allergies. Use in each nostril as directed) 30 mL 12  . Blood Glucose Monitoring Suppl (ONE TOUCH ULTRA MINI) w/Device KIT USE DAILY AS DIRECTED. 1 each 0  . diazepam (VALIUM) 10 MG tablet Take 10 mg by mouth daily as needed for anxiety.     . DULoxetine (CYMBALTA) 60 MG capsule TAKE ONE CAPSULE BY MOUTH DAILY 90 capsule 1  . DUREZOL 0.05 % EMUL Apply 1 drop to eye daily at 8 pm.    . glucose blood (ONE TOUCH ULTRA TEST) test strip Use as instructed 4 times daily dx e11.65 150 each 5   . insulin aspart (NOVOLOG FLEXPEN) 100 UNIT/ML FlexPen INJECT 10 TO 20 UNITS THREE TIMES A DAY WITH MEALS (Patient taking differently: INJECT 10 TO 30 UNITS THREE TIMES A DAY WITH MEALS) 15 mL 5  . insulin glargine (LANTUS) 100 unit/mL SOPN Inject 90 units into the skin daily 15 mL 11  . linaclotide (LINZESS) 72 MCG capsule 1 PO 30 mins WITH your first meal 30 capsule 5  . lisinopril (PRINIVIL,ZESTRIL) 2.5 MG tablet TAKE ONE (1) TABLET EACH DAY 90 tablet 1  . metoCLOPramide (REGLAN) 5 MG tablet 1-2 po q6h If needed for nausea or vomiting 60 tablet 3  . mirtazapine (REMERON) 30 MG tablet Take 1 tablet by mouth daily at 8 pm.    . mometasone (NASONEX) 50 MCG/ACT nasal spray Place 2 sprays into the nose daily. (Patient taking differently: Place 2 sprays into the nose daily as needed (Allergies). ) 17 g 12  . montelukast (SINGULAIR) 10 MG tablet TAKE ONE TABLET BY MOUTH EVERY NIGHT AT BEDTIME 90 tablet 1  . omeprazole (PRILOSEC) 40 MG capsule TAKE ONE CAPSULE BY MOUTH 30 MINUTES PRIOR TO BREAKFAST AND SUPPER 180 capsule 3  . ondansetron (ZOFRAN) 4 MG tablet Take 1 tablet (4 mg total) by mouth every 8 (eight) hours as needed for nausea or vomiting. 60 tablet 3  . ONETOUCH DELICA LANCETS 44B MISC USE FOUR TIMES A DAY AS DIRECTED. 100 each 11  . ONETOUCH DELICA LANCETS 20F MISC Test four times daily dx e11.65 150 each 5  . pravastatin (  PRAVACHOL) 80 MG tablet TAKE 1 TABLET BY MOUTH IN THE EVENING 90 tablet 1   No current facility-administered medications on file prior to visit.     No Known Allergies  Objective:  General: Alert and oriented x3 in no acute distress  Dermatology: No open lesions bilateral lower extremities, no webspace macerations, no ecchymosis bilateral, all nails x 10 are elongated and minimally dystrophic.   Vascular: Focal edema noted to right 2nd toe. Dorsalis Pedis and Posterior Tibial pedal pulses 1/4, Capillary Fill Time 3 seconds,(-) pedal hair growth bilateral,  Temperature gradient within normal limits.  Neurology: Gross sensation intact via light touch bilateral, Protective sensation absent with Thornell Mule Monofilament to all pedal sites, No babinski sign present bilateral. (- )Tinels sign right foot.   Musculoskeletal: There is tenderness with palpation at 2nd toe on right, Varus lesser hammertoes. No pain with calf compression bilateral. All joint range of motion is within normal limits except at 2nd toe on right where there is pain, minimal pain to R>L fascial band with tightness, Strength within normal limits in all groups bilateral.   Gait: Unassisted, Antalgic gait  Xrays  Right Foot   Impression: Closed nondisplaced fracture at head of proximal phalanx of 2nd toe on right foot with 1 mm fracture gap unchanged from prior.     Assessment and Plan: Problem List Items Addressed This Visit    None    Visit Diagnoses    Closed displaced fracture of proximal phalanx of lesser toe of right foot with nonunion, subsequent encounter    -  Primary   Relevant Orders   DG Foot Complete Right (Completed)   Right foot pain       Diabetic polyneuropathy associated with type 2 diabetes mellitus ([place])           -Complete examination performed -Recommend continue with stretching to prevent fasciitis symptoms from flaring back up  -Xrays reviewed -Discussed treatement options for fracture; risks, alternatives, and benefits explained. -Dispensed Exogen bone strim in office with Rep, Lytle Michaels who educated patient on use at marked area at right 2nd toe -Recommend continue with post op or stiff soled shoe to patient to wear at all times  -Recommend protection, rest, elevation daily until symptoms improve -Patient to return to office in 5-6 weeks for serial x-rays to assess healing  or sooner if condition worsens.   Landis Martins, DPM

## 2017-04-30 ENCOUNTER — Other Ambulatory Visit: Payer: Self-pay

## 2017-04-30 DIAGNOSIS — S92511K Displaced fracture of proximal phalanx of right lesser toe(s), subsequent encounter for fracture with nonunion: Secondary | ICD-10-CM | POA: Diagnosis not present

## 2017-04-30 MED ORDER — ONETOUCH DELICA LANCETS 33G MISC: 1 refills | Status: DC

## 2017-04-30 MED ORDER — GLUCOSE BLOOD VI STRP: ORAL_STRIP | 1 refills | Status: DC

## 2017-04-30 MED ORDER — INSULIN PEN NEEDLE 32G X 4 MM MISC: 3 refills | Status: DC

## 2017-04-30 NOTE — Telephone Encounter (Signed)
Requesting  One touch ultra blue (checks 4x a day) , one touch lancets, and 32 gauge needles.  Fax # : [phone] [facility]

## 2017-04-30 NOTE — Telephone Encounter (Signed)
Faxed in

## 2017-05-02 DIAGNOSIS — H4321 Crystalline deposits in vitreous body, right eye: Secondary | ICD-10-CM | POA: Diagnosis not present

## 2017-05-02 DIAGNOSIS — H35373 Puckering of macula, bilateral: Secondary | ICD-10-CM | POA: Diagnosis not present

## 2017-05-02 DIAGNOSIS — H3562 Retinal hemorrhage, left eye: Secondary | ICD-10-CM | POA: Diagnosis not present

## 2017-05-02 DIAGNOSIS — E103513 Type 1 diabetes mellitus with proliferative diabetic retinopathy with macular edema, bilateral: Secondary | ICD-10-CM | POA: Diagnosis not present

## 2017-05-08 ENCOUNTER — Ambulatory Visit (HOSPITAL_COMMUNITY)
Admission: RE | Admit: 2017-05-08 | Discharge: 2017-05-08 | Disposition: A | Discharge status: Home / Self Care | Payer: PPO | Source: Ambulatory Visit | Attending: Family Medicine | Admitting: Family Medicine

## 2017-05-08 DIAGNOSIS — Z1239 Encounter for other screening for malignant neoplasm of breast: Secondary | ICD-10-CM

## 2017-05-08 DIAGNOSIS — Z1231 Encounter for screening mammogram for malignant neoplasm of breast: Secondary | ICD-10-CM | POA: Diagnosis not present

## 2017-05-19 ENCOUNTER — Other Ambulatory Visit: Payer: Self-pay | Admitting: Family Medicine

## 2017-05-19 NOTE — Telephone Encounter (Signed)
Seen 6 [phone] 18 

## 2017-05-20 ENCOUNTER — Telehealth: Payer: Self-pay | Admitting: Family Medicine

## 2017-05-20 NOTE — Telephone Encounter (Signed)
New Message  Pt voiced she received an automatic call to get her A1C, pt voiced she had A1C's last month or about a month ago and wants to know if she needs to have them done again.  Please f/u

## 2017-05-21 NOTE — Telephone Encounter (Signed)
Patient aware its the automated system and she is up to date

## 2017-06-02 ENCOUNTER — Other Ambulatory Visit: Payer: Self-pay | Admitting: Family Medicine

## 2017-06-03 ENCOUNTER — Ambulatory Visit: Payer: PPO | Admitting: Sports Medicine

## 2017-06-23 ENCOUNTER — Encounter: Payer: Self-pay | Admitting: Gastroenterology

## 2017-06-24 ENCOUNTER — Ambulatory Visit (INDEPENDENT_AMBULATORY_CARE_PROVIDER_SITE_OTHER): Payer: PPO | Admitting: Sports Medicine

## 2017-06-24 ENCOUNTER — Ambulatory Visit (INDEPENDENT_AMBULATORY_CARE_PROVIDER_SITE_OTHER): Payer: PPO

## 2017-06-24 DIAGNOSIS — E1142 Type 2 diabetes mellitus with diabetic polyneuropathy: Secondary | ICD-10-CM

## 2017-06-24 DIAGNOSIS — S92511K Displaced fracture of proximal phalanx of right lesser toe(s), subsequent encounter for fracture with nonunion: Secondary | ICD-10-CM

## 2017-06-24 DIAGNOSIS — M79671 Pain in right foot: Secondary | ICD-10-CM | POA: Diagnosis not present

## 2017-06-24 NOTE — Progress Notes (Signed)
Subjective: Ashley Armstrong is a 51 y.o. Diabetic female patient who returns to office for evaluation of Right foot pain and swelling secondary to fracture of 2nd toe. Patient states that toes feeling better; Has been using Exogen bone stimulator 1 month. Patient denies any other pedal complaints.   Patient Active Problem List   Diagnosis Date Noted  . Gallbladder polyp 12/13/2016  . GERD (gastroesophageal reflux disease) 09/05/2016  . Constipation 10/12/2015  . Seasonal allergies 11/09/2014  . Depression with anxiety 06/18/2014  . Tight fascia 01/17/2014  . Degenerative disc disease, cervical 12/07/2013  . CTS (carpal tunnel syndrome) 07/08/2013  . Polyneuropathy 07/08/2013  . Diabetic nephropathy ([place]) 06/23/2013  . Tennis elbow syndrome 04/15/2013  . Rotator cuff syndrome of left shoulder 04/15/2013  . Vitamin D deficiency 03/20/2013  . Onychomycosis of toenail 03/20/2013  . DIABETES MELLITUS, WITH RENAL COMPLICATIONS 34/19/6222  . Gastroparesis 06/16/2008  . Hyperlipidemia LDL goal <100 04/13/2008  . Generalized anxiety disorder 04/13/2008    Current Outpatient Prescriptions on File Prior to Visit  Medication Sig Dispense Refill  . azelastine (ASTELIN) 0.1 % nasal spray Place 2 sprays into both nostrils 2 (two) times daily. Use in each nostril as directed (Patient taking differently: Place 2 sprays into both nostrils 2 (two) times daily as needed for allergies. Use in each nostril as directed) 30 mL 12  . Blood Glucose Monitoring Suppl (ONE TOUCH ULTRA MINI) w/Device KIT USE DAILY AS DIRECTED. 1 each 0  . diazepam (VALIUM) 10 MG tablet Take 10 mg by mouth daily as needed for anxiety.     . DULoxetine (CYMBALTA) 60 MG capsule TAKE ONE CAPSULE BY MOUTH DAILY 90 capsule 1  . DUREZOL 0.05 % EMUL Apply 1 drop to eye daily at 8 pm.    . glucose blood (ONE TOUCH ULTRA TEST) test strip Use as instructed 4 times daily dx e11.65 350 each 1  . insulin aspart (NOVOLOG FLEXPEN) 100 UNIT/ML  FlexPen INJECT 10 TO 20 UNITS THREE TIMES A DAY WITH MEALS (Patient taking differently: INJECT 10 TO 30 UNITS THREE TIMES A DAY WITH MEALS) 15 mL 5  . insulin glargine (LANTUS) 100 unit/mL SOPN Inject 90 units into the skin daily 15 mL 11  . Insulin Pen Needle (SURE COMFORT PEN NEEDLES) 32G X 4 MM MISC Use daily with insulin 4 times a day 350 each 3  . linaclotide (LINZESS) 72 MCG capsule 1 PO 30 mins WITH your first meal 30 capsule 5  . lisinopril (PRINIVIL,ZESTRIL) 2.5 MG tablet TAKE ONE (1) TABLET EACH DAY 90 tablet 0  . metoCLOPramide (REGLAN) 5 MG tablet 1-2 po q6h If needed for nausea or vomiting 60 tablet 3  . mirtazapine (REMERON) 30 MG tablet Take 1 tablet by mouth daily at 8 pm.    . mometasone (NASONEX) 50 MCG/ACT nasal spray Place 2 sprays into the nose daily. (Patient taking differently: Place 2 sprays into the nose daily as needed (Allergies). ) 17 g 12  . montelukast (SINGULAIR) 10 MG tablet TAKE ONE TABLET BY MOUTH EVERY NIGHT AT BEDTIME 90 tablet 1  . omeprazole (PRILOSEC) 40 MG capsule TAKE ONE CAPSULE BY MOUTH 30 MINUTES PRIOR TO BREAKFAST AND SUPPER 180 capsule 3  . ondansetron (ZOFRAN) 4 MG tablet Take 1 tablet (4 mg total) by mouth every 8 (eight) hours as needed for nausea or vomiting. 60 tablet 3  . ONETOUCH DELICA LANCETS 97L MISC Test four times daily dx e11.65 150 each 5  . [facility] DELICA  LANCETS 33G MISC USE FOUR TIMES A DAY AS DIRECTED. 350 each 1  . pravastatin (PRAVACHOL) 80 MG tablet TAKE ONE TABLET BY MOUTH IN THE EVENING. 90 tablet 1   No current facility-administered medications on file prior to visit.     No Known Allergies  Objective:  General: Alert and oriented x3 in no acute distress  Dermatology: No open lesions bilateral lower extremities, no webspace macerations, no ecchymosis bilateral, all nails x 10 are elongated and minimally dystrophic.   Vascular:Decreased focal edema noted to right 2nd toe. Dorsalis Pedis and Posterior Tibial pedal pulses  1/4, Capillary Fill Time 3 seconds,(-) pedal hair growth bilateral, Temperature gradient within normal limits.  Neurology: Gross sensation intact via light touch bilateral, Protective sensation absent with Thornell Mule Monofilament to all pedal sites, No babinski sign present bilateral. (- )Tinels sign right foot.   Musculoskeletal: There is decreased tenderness with palpation at 2nd toe on right, Varus lesser hammertoes. No pain with calf compression bilateral. All joint range of motion is within normal limits except at 2nd toe on right where there is pain, minimal pain to R>L fascial band with tightness, Strength within normal limits in all groups bilateral.   Gait: Unassisted, Antalgic gait  Xrays  Right Foot   Impression: Closed nondisplaced fracture at head of proximal phalanx of 2nd toe on right foot with 1 mm fracture gap that is healing in, with evidence of bone callus much improved from prior.     Assessment and Plan: Problem List Items Addressed This Visit    None    Visit Diagnoses    Closed displaced fracture of proximal phalanx of lesser toe of right foot with nonunion, subsequent encounter    -  Primary   Relevant Orders   DG Foot Complete Right   Right foot pain       Diabetic polyneuropathy associated with type 2 diabetes mellitus ([facility])           -Complete examination performed -Xrays reviewed -Discussed continued care for fracture; risks, alternatives, and benefits explained. -Continue with Exogen bone stim x 1 more month at right 2nd toe -Recommend continue with post op or stiff soled shoe to patient to wear at all times x 1 month , then may transition back to a stiff soled tennis shoe thereafter -Recommend protection, rest, elevation daily until symptoms are completely resolved -Patient to return to office in 8 weeks for diabetic foot care and for final serial x-rays to assess final healing at right second toe or sooner if condition worsens.   Landis Martins,  DPM

## 2017-07-04 DIAGNOSIS — H3562 Retinal hemorrhage, left eye: Secondary | ICD-10-CM | POA: Diagnosis not present

## 2017-07-04 DIAGNOSIS — H4321 Crystalline deposits in vitreous body, right eye: Secondary | ICD-10-CM | POA: Diagnosis not present

## 2017-07-04 DIAGNOSIS — H35373 Puckering of macula, bilateral: Secondary | ICD-10-CM | POA: Diagnosis not present

## 2017-07-04 DIAGNOSIS — E103513 Type 1 diabetes mellitus with proliferative diabetic retinopathy with macular edema, bilateral: Secondary | ICD-10-CM | POA: Diagnosis not present

## 2017-07-04 LAB — HM DIABETES EYE EXAM

## 2017-07-11 ENCOUNTER — Telehealth: Payer: Self-pay | Admitting: *Deleted

## 2017-07-11 DIAGNOSIS — E785 Hyperlipidemia, unspecified: Secondary | ICD-10-CM

## 2017-07-11 LAB — CBC
HCT: 36.4 % (ref 35.0–45.0)
Hemoglobin: 11.9 g/dL (ref 11.7–15.5)
MCH: 28.5 pg (ref 27.0–33.0)
MCHC: 32.7 g/dL (ref 32.0–36.0)
MCV: 87.3 fL (ref 80.0–100.0)
MPV: 9.7 fL (ref 7.5–12.5)
PLATELETS: 280 10*3/uL (ref 140–400)
RBC: 4.17 MIL/uL (ref 3.80–5.10)
RDW: 13.5 % (ref 11.0–15.0)
WBC: 6.2 10*3/uL (ref 3.8–10.8)

## 2017-07-11 NOTE — Telephone Encounter (Signed)
Vaughan Basta called from [place] and stated the patient came in and the orders was expired. She needs new lab orders. Thank you .

## 2017-07-12 LAB — URINALYSIS, ROUTINE W REFLEX MICROSCOPIC

## 2017-07-12 LAB — COMPLETE METABOLIC PANEL WITH GFR
ALT: 14 U/L (ref 6–29)
AST: 18 U/L (ref 10–35)
Albumin: 3.8 g/dL (ref 3.6–5.1)
Alkaline Phosphatase: 105 U/L (ref 33–115)
BILIRUBIN TOTAL: 0.3 mg/dL (ref 0.2–1.2)
BUN: 25 mg/dL (ref 7–25)
CHLORIDE: 105 mmol/L (ref 98–110)
CO2: 24 mmol/L (ref 20–32)
CREATININE: 1.38 mg/dL — AB (ref 0.50–1.10)
Calcium: 9.1 mg/dL (ref 8.6–10.2)
GFR, Est African American: 52 mL/min — ABNORMAL LOW (ref >=60)
GFR, Est Non African American: 45 mL/min — ABNORMAL LOW (ref >=60)
GLUCOSE: 196 mg/dL — AB (ref 65–99)
Potassium: 5 mmol/L (ref 3.5–5.3)
SODIUM: 140 mmol/L (ref 135–146)
TOTAL PROTEIN: 6.3 g/dL (ref 6.1–8.1)

## 2017-07-12 LAB — LIPID PANEL
Cholesterol: 183 mg/dL (ref <200)
HDL: 59 mg/dL (ref >50)
LDL CALC: 107 mg/dL — AB (ref <100)
Total CHOL/HDL Ratio: 3.1 Ratio (ref <5.0)
Triglycerides: 87 mg/dL (ref <150)
VLDL: 17 mg/dL (ref <30)

## 2017-07-14 LAB — MICROALBUMIN / CREATININE URINE RATIO

## 2017-07-15 ENCOUNTER — Telehealth: Payer: Self-pay | Admitting: Family Medicine

## 2017-07-15 NOTE — Telephone Encounter (Signed)
Iona Beard from [place] left message regarding patient. He stated he had a lab review message. Returned call-    Patient unable to void- advised to return for collection. Microalbumin for urine.    Would you like for me to call patient back into office for this?

## 2017-07-15 NOTE — Telephone Encounter (Signed)
When able to void at next oV , this is not an emergency

## 2017-07-16 ENCOUNTER — Ambulatory Visit (INDEPENDENT_AMBULATORY_CARE_PROVIDER_SITE_OTHER): Payer: PPO | Admitting: Family Medicine

## 2017-07-16 ENCOUNTER — Encounter: Payer: Self-pay | Admitting: Family Medicine

## 2017-07-16 VITALS — BP 112/72 | HR 91 | Temp 98.4°F | Resp 18 | Ht 68.0 in | Wt 131.2 lb

## 2017-07-16 DIAGNOSIS — E785 Hyperlipidemia, unspecified: Secondary | ICD-10-CM

## 2017-07-16 DIAGNOSIS — Z23 Encounter for immunization: Secondary | ICD-10-CM | POA: Diagnosis not present

## 2017-07-16 DIAGNOSIS — F418 Other specified anxiety disorders: Secondary | ICD-10-CM | POA: Diagnosis not present

## 2017-07-16 DIAGNOSIS — E1029 Type 1 diabetes mellitus with other diabetic kidney complication: Secondary | ICD-10-CM | POA: Diagnosis not present

## 2017-07-16 DIAGNOSIS — E1065 Type 1 diabetes mellitus with hyperglycemia: Secondary | ICD-10-CM | POA: Diagnosis not present

## 2017-07-16 DIAGNOSIS — IMO0002 Reserved for concepts with insufficient information to code with codable children: Secondary | ICD-10-CM

## 2017-07-16 NOTE — Telephone Encounter (Signed)
microalb not due

## 2017-07-16 NOTE — Patient Instructions (Signed)
F/u in 3 month, call if you need me  Before  You will likely be referred to endocrinology    HBA1C today and flu vaccine

## 2017-07-17 DIAGNOSIS — E118 Type 2 diabetes mellitus with unspecified complications: Secondary | ICD-10-CM | POA: Diagnosis not present

## 2017-07-18 ENCOUNTER — Telehealth: Payer: Self-pay | Admitting: Family Medicine

## 2017-07-18 DIAGNOSIS — E118 Type 2 diabetes mellitus with unspecified complications: Secondary | ICD-10-CM

## 2017-07-18 DIAGNOSIS — E785 Hyperlipidemia, unspecified: Secondary | ICD-10-CM

## 2017-07-18 DIAGNOSIS — E103512 Type 1 diabetes mellitus with proliferative diabetic retinopathy with macular edema, left eye: Secondary | ICD-10-CM | POA: Diagnosis not present

## 2017-07-18 LAB — HEMOGLOBIN A1C
EAG (MMOL/L): 11.2 (calc)
Hgb A1c MFr Bld: 8.7 % of total Hgb — ABNORMAL HIGH (ref <5.7)
Mean Plasma Glucose: 203 (calc)

## 2017-07-18 NOTE — Telephone Encounter (Signed)
Patient would like a1c result.

## 2017-07-19 ENCOUNTER — Other Ambulatory Visit: Payer: Self-pay | Admitting: Family Medicine

## 2017-07-19 DIAGNOSIS — M79602 Pain in left arm: Secondary | ICD-10-CM

## 2017-07-20 ENCOUNTER — Encounter: Payer: Self-pay | Admitting: Family Medicine

## 2017-07-20 ENCOUNTER — Other Ambulatory Visit: Payer: Self-pay | Admitting: Family Medicine

## 2017-07-20 MED ORDER — INSULIN GLARGINE 100 UNIT/ML SOLOSTAR PEN: 100.0000 [IU] | PEN_INJECTOR | Freq: Every day | SUBCUTANEOUS | 11 refills | Status: DC

## 2017-07-20 NOTE — Telephone Encounter (Signed)
I spoke with her, pls mail an order for non fast hBa1C, chem 7 and EGFr to be drawn on 12/6 or shortly after

## 2017-07-20 NOTE — Assessment & Plan Note (Signed)
Controlled, no change in medication Managed by psychiatry 

## 2017-07-20 NOTE — Assessment & Plan Note (Signed)
Ashley Armstrong is reminded of the importance of commitment to daily physical activity for 30 minutes or more, as able and the need to limit carbohydrate intake to 30 to 60 grams per meal to help with blood sugar control.   The need to take medication as prescribed, test blood sugar as directed, and to call between visits if there is a concern that blood sugar is uncontrolled is also discussed.   Ashley Armstrong is reminded of the importance of daily foot exam, annual eye examination, and good blood sugar, blood pressure and cholesterol control.  Diabetic Labs Latest Ref Rng & Units 07/17/2017 07/11/2017 04/15/2017 01/15/2017 09/23/2016  HbA1c <5.7 % of total Hgb 8.7(H) - 9.2(H) 8.8(H) 9.0(H)  Microalbumin Not estab mg/dL - CANCELED - - 14.7  Micro/Creat Ratio <30 mcg/mg creat - CANCELED - - 136(H)  Chol <200 mg/dL - 183 180 - -  HDL >50 mg/dL - 59 63 - -  Calc LDL <100 mg/dL - 107(H) 87 - -  Triglycerides <150 mg/dL - 87 148 - -  Creatinine 0.50 - 1.10 mg/dL - 1.38(H) 1.15(H) 1.07 1.08   BP/Weight 07/16/2017 04/15/2017 03/26/2017 02/12/2017 02/07/2017 01/15/2017 05/12/6377  Systolic BP 588 90 97 502 774 128 786  Diastolic BP 72 60 66 70 76 70 80  Wt. (Lbs) 131.25 130 128.8 128.08 128 126 126  BMI 19.96 19.77 19.58 19.47 19.46 19.16 19.16   Foot/eye exam completion dates Latest Ref Rng & Units 09/23/2016 06/08/2015  Eye Exam No Retinopathy - -  Foot Form Completion - Done Done   Slight;ly improved, increase lantus to 100 units, and more diligence with diet, refused sweing diabetic educator, she is aware that her renal function has deteriorated

## 2017-07-20 NOTE — Assessment & Plan Note (Signed)
Uncontrolled No med change but dietary change needed Hyperlipidemia:Low fat diet discussed and encouraged.   Lipid Panel  Lab Results  Component Value Date   CHOL 183 07/11/2017   HDL 59 07/11/2017   LDLCALC 107 (H) 07/11/2017   TRIG 87 07/11/2017   CHOLHDL 3.1 07/11/2017

## 2017-07-20 NOTE — Progress Notes (Signed)
Ashley Armstrong     MRN: [id number]      DOB: 05-12-1966   HPI Ashley Armstrong is here for follow up and re-evaluation of chronic medical conditions, medication management and review of any available recent lab and radiology data.  Preventive health is updated, specifically  Cancer screening and Immunization.   Questions or concerns regarding consultations or procedures which the PT has had in the interim are  addressed. The PT denies any adverse reactions to current medications since the last visit.  Presents with log which shows elevated blood sugars and she does admit to dietary indiscretion On lab review I make it clear that ig her HBa1C is 9 or above she will NEED to see endo as her renal function has deteriorated and I explain that all of er health problems are because of uncontrolled blood sugar, she agrees to this and states  That if the local endo will "take her back" she will return. Denies polyuria, polydipsia, blurred vision , or hypoglycemic episodes.  ROS Denies recent fever or chills. Denies sinus pressure, nasal congestion, ear pain or sore throat. Denies chest congestion, productive cough or wheezing. Denies chest pains, palpitations and leg swelling Denies abdominal pain, nausea, vomiting,diarrhea or constipation.   Denies dysuria, frequency, hesitancy or incontinence. Denies joint pain, swelling and limitation in mobility. Denies headaches, seizures, numbness, or tingling. Denies uncontrolled  depression, anxiety or insomnia. Denies skin break down or rash.   PE  BP 112/72 (BP Location: Left Arm, Patient Position: Sitting, Cuff Size: Normal)   Pulse 91   Temp 98.4 F (36.9 C) (Other (Comment))   Resp 18   Ht 5\' 8"  (1.727 m)   Wt 131 lb 4 oz (59.5 kg)   LMP 07/26/2013   SpO2 95%   BMI 19.96 kg/m   Patient alert and oriented and in no cardiopulmonary distress.  HEENT: No facial asymmetry, EOMI,   oropharynx pink and moist.  Neck supple no JVD, no mass.  Chest:  Clear to auscultation bilaterally.  CVS: S1, S2 no murmurs, no S3.Regular rate.  ABD: Soft non tender.   Ext: No edema  MS: Adequate ROM spine, shoulders, hips and knees.  Skin: Intact, no ulcerations or rash noted.  Psych: Good eye contact, normal affect. Memory intact not anxious or depressed appearing.  CNS: CN 2-12 intact, power,  normal throughout.no focal deficits noted.   Assessment & Plan  DIABETES MELLITUS, WITH RENAL COMPLICATIONS Ashley Armstrong is reminded of the importance of commitment to daily physical activity for 30 minutes or more, as able and the need to limit carbohydrate intake to 30 to 60 grams per meal to help with blood sugar control.   The need to take medication as prescribed, test blood sugar as directed, and to call between visits if there is a concern that blood sugar is uncontrolled is also discussed.   Ashley Armstrong is reminded of the importance of daily foot exam, annual eye examination, and good blood sugar, blood pressure and cholesterol control.  Diabetic Labs Latest Ref Rng & Units 07/17/2017 07/11/2017 04/15/2017 01/15/2017 09/23/2016  HbA1c <5.7 % of total Hgb 8.7(H) - 9.2(H) 8.8(H) 9.0(H)  Microalbumin Not estab mg/dL - CANCELED - - 14.7  Micro/Creat Ratio <30 mcg/mg creat - CANCELED - - 136(H)  Chol <200 mg/dL - 183 180 - -  HDL >50 mg/dL - 59 63 - -  Calc LDL <100 mg/dL - 107(H) 87 - -  Triglycerides <150 mg/dL - 87 148 - -  Creatinine 0.50 - 1.10 mg/dL - 1.38(H) 1.15(H) 1.07 1.08   BP/Weight 07/16/2017 04/15/2017 03/26/2017 02/12/2017 02/07/2017 01/15/2017 3/81/7711  Systolic BP 657 90 97 903 833 383 291  Diastolic BP 72 60 66 70 76 70 80  Wt. (Lbs) 131.25 130 128.8 128.08 128 126 126  BMI 19.96 19.77 19.58 19.47 19.46 19.16 19.16   Foot/eye exam completion dates Latest Ref Rng & Units 09/23/2016 06/08/2015  Eye Exam No Retinopathy - -  Foot Form Completion - Done Done   Slight;ly improved, increase lantus to 100 units, and more diligence with diet, refused  sweing diabetic educator, she is aware that her renal function has deteriorated     Hyperlipidemia LDL goal <100 Uncontrolled No med change but dietary change needed Hyperlipidemia:Low fat diet discussed and encouraged.   Lipid Panel  Lab Results  Component Value Date   CHOL 183 07/11/2017   HDL 59 07/11/2017   LDLCALC 107 (H) 07/11/2017   TRIG 87 07/11/2017   CHOLHDL 3.1 07/11/2017       Diabetic nephropathy detreriorated  Depression with anxiety Controlled, no change in medication Managed by psychiatry

## 2017-07-20 NOTE — Assessment & Plan Note (Signed)
detreriorated

## 2017-07-21 NOTE — Telephone Encounter (Signed)
Seen 9 5 18 

## 2017-07-21 NOTE — Telephone Encounter (Signed)
Done

## 2017-07-23 DIAGNOSIS — F410 Panic disorder [episodic paroxysmal anxiety] without agoraphobia: Secondary | ICD-10-CM | POA: Diagnosis not present

## 2017-07-29 ENCOUNTER — Other Ambulatory Visit: Payer: Self-pay | Admitting: Family Medicine

## 2017-07-30 MED ORDER — LISINOPRIL 2.5 MG PO TABS: 2.5000 mg | ORAL_TABLET | Freq: Every day | ORAL | 0 refills | Status: DC

## 2017-08-04 ENCOUNTER — Encounter: Payer: Self-pay | Admitting: Family Medicine

## 2017-08-08 ENCOUNTER — Other Ambulatory Visit: Payer: Self-pay | Admitting: Family Medicine

## 2017-08-11 MED ORDER — ONETOUCH ULTRA MINI W/DEVICE KIT: PACK | 0 refills | Status: DC

## 2017-08-15 DIAGNOSIS — H35373 Puckering of macula, bilateral: Secondary | ICD-10-CM | POA: Diagnosis not present

## 2017-08-15 DIAGNOSIS — E103513 Type 1 diabetes mellitus with proliferative diabetic retinopathy with macular edema, bilateral: Secondary | ICD-10-CM | POA: Diagnosis not present

## 2017-08-15 DIAGNOSIS — H4321 Crystalline deposits in vitreous body, right eye: Secondary | ICD-10-CM | POA: Diagnosis not present

## 2017-08-15 DIAGNOSIS — E103511 Type 1 diabetes mellitus with proliferative diabetic retinopathy with macular edema, right eye: Secondary | ICD-10-CM | POA: Diagnosis not present

## 2017-08-15 DIAGNOSIS — H3562 Retinal hemorrhage, left eye: Secondary | ICD-10-CM | POA: Diagnosis not present

## 2017-08-26 ENCOUNTER — Encounter: Payer: PPO | Admitting: Sports Medicine

## 2017-08-26 ENCOUNTER — Ambulatory Visit: Payer: PPO

## 2017-08-27 NOTE — Progress Notes (Signed)
Patient canceled appointment  This encounter was created in error - please disregard.

## 2017-09-04 ENCOUNTER — Other Ambulatory Visit: Payer: Self-pay | Admitting: Family Medicine

## 2017-09-04 NOTE — Telephone Encounter (Signed)
Seen 9 5 18 

## 2017-09-10 ENCOUNTER — Ambulatory Visit: Payer: PPO | Admitting: Gastroenterology

## 2017-09-10 ENCOUNTER — Encounter: Payer: Self-pay | Admitting: Gastroenterology

## 2017-09-10 NOTE — Progress Notes (Deleted)
   Subjective:    Patient ID: Ashley Armstrong, female    DOB: 06-Jan-1966, 51 y.o.   MRN: [id number]  HPI    Review of Systems     Objective:   Physical Exam        Assessment & Plan:

## 2017-09-15 DIAGNOSIS — F329 Major depressive disorder, single episode, unspecified: Secondary | ICD-10-CM | POA: Diagnosis not present

## 2017-09-16 ENCOUNTER — Ambulatory Visit (INDEPENDENT_AMBULATORY_CARE_PROVIDER_SITE_OTHER): Payer: PPO

## 2017-09-16 ENCOUNTER — Encounter: Payer: Self-pay | Admitting: Sports Medicine

## 2017-09-16 ENCOUNTER — Ambulatory Visit (INDEPENDENT_AMBULATORY_CARE_PROVIDER_SITE_OTHER): Payer: PPO | Admitting: Sports Medicine

## 2017-09-16 DIAGNOSIS — M79671 Pain in right foot: Secondary | ICD-10-CM | POA: Diagnosis not present

## 2017-09-16 DIAGNOSIS — M79672 Pain in left foot: Secondary | ICD-10-CM | POA: Diagnosis not present

## 2017-09-16 DIAGNOSIS — B351 Tinea unguium: Secondary | ICD-10-CM

## 2017-09-16 DIAGNOSIS — E1142 Type 2 diabetes mellitus with diabetic polyneuropathy: Secondary | ICD-10-CM

## 2017-09-16 DIAGNOSIS — S92511K Displaced fracture of proximal phalanx of right lesser toe(s), subsequent encounter for fracture with nonunion: Secondary | ICD-10-CM

## 2017-09-16 NOTE — Progress Notes (Signed)
  Subjective: Ashley Armstrong is a 51 y.o. Diabetic female patient who returns to office for evaluation of Right foot pain and swelling secondary to fracture of 2nd toe. Patient states that toes feeling better; Has been using Exogen bone stimulator 3 months. Patient is also here for nail trim. Patient denies any other pedal complaints.   FBS 165  Patient Active Problem List   Diagnosis Date Noted  . Gallbladder polyp 12/13/2016  . GERD (gastroesophageal reflux disease) 09/05/2016  . Constipation 10/12/2015  . Seasonal allergies 11/09/2014  . Depression with anxiety 06/18/2014  . Tight fascia 01/17/2014  . Degenerative disc disease, cervical 12/07/2013  . CTS (carpal tunnel syndrome) 07/08/2013  . Polyneuropathy 07/08/2013  . Diabetic nephropathy (HCC) 06/23/2013  . Tennis elbow syndrome 04/15/2013  . Rotator cuff syndrome of left shoulder 04/15/2013  . Vitamin D deficiency 03/20/2013  . Onychomycosis of toenail 03/20/2013  . DIABETES MELLITUS, WITH RENAL COMPLICATIONS 10/24/2008  . Gastroparesis 06/16/2008  . Hyperlipidemia LDL goal <100 04/13/2008  . Generalized anxiety disorder 04/13/2008    Current Outpatient Medications on File Prior to Visit  Medication Sig Dispense Refill  . azelastine (ASTELIN) 0.1 % nasal spray Place 2 sprays into both nostrils 2 (two) times daily. Use in each nostril as directed (Patient taking differently: Place 2 sprays into both nostrils 2 (two) times daily as needed for allergies. Use in each nostril as directed) 30 mL 12  . Blood Glucose Monitoring Suppl (ONE TOUCH ULTRA MINI) w/Device KIT Use as directed 1 each 0  . diazepam (VALIUM) 10 MG tablet Take 10 mg by mouth daily as needed for anxiety.     . DULoxetine (CYMBALTA) 60 MG capsule TAKE ONE (1) CAPSULE EACH DAY 90 capsule 1  . DUREZOL 0.05 % EMUL Apply 1 drop to eye daily at 8 pm.    . glucose blood (ONE TOUCH ULTRA TEST) test strip Use as instructed 4 times daily dx e11.65 350 each 1  .  Insulin Glargine (LANTUS) 100 UNIT/ML Solostar Pen Inject 100 Units into the skin daily at 10 pm. 15 mL 11  . Insulin Pen Needle (SURE COMFORT PEN NEEDLES) 32G X 4 MM MISC Use daily with insulin 4 times a day 350 each 3  . linaclotide (LINZESS) 72 MCG capsule 1 PO 30 mins WITH your first meal 30 capsule 5  . lisinopril (PRINIVIL,ZESTRIL) 2.5 MG tablet Take 1 tablet (2.5 mg total) by mouth daily. 90 tablet 0  . metoCLOPramide (REGLAN) 5 MG tablet 1-2 po q6h If needed for nausea or vomiting 60 tablet 3  . mirtazapine (REMERON) 30 MG tablet Take 1 tablet by mouth daily at 8 pm.    . mometasone (NASONEX) 50 MCG/ACT nasal spray Place 2 sprays into the nose daily. (Patient taking differently: Place 2 sprays into the nose daily as needed (Allergies). ) 17 g 12  . montelukast (SINGULAIR) 10 MG tablet TAKE ONE TABLET BY MOUTH EVERY NIGHT AT BEDTIME 90 tablet 1  . NOVOLOG FLEXPEN 100 UNIT/ML FlexPen INJECT 10 TO 20 UNITS THREE TIMES A DAY WITH MEALS 15 mL 3  . omeprazole (PRILOSEC) 40 MG capsule TAKE ONE CAPSULE BY MOUTH 30 MINUTES PRIOR TO BREAKFAST AND SUPPER 180 capsule 3  . ondansetron (ZOFRAN) 4 MG tablet Take 1 tablet (4 mg total) by mouth every 8 (eight) hours as needed for nausea or vomiting. 60 tablet 3  . ONETOUCH DELICA LANCETS 33G MISC Test four times daily dx e11.65 150 each 5  .   ONETOUCH DELICA LANCETS 81X MISC USE FOUR TIMES A DAY AS DIRECTED. 350 each 1  . pravastatin (PRAVACHOL) 80 MG tablet TAKE ONE TABLET BY MOUTH IN THE EVENING. 90 tablet 1   No current facility-administered medications on file prior to visit.     No Known Allergies  Objective:  General: Alert and oriented x3 in no acute distress  Dermatology: No open lesions bilateral lower extremities, no webspace macerations, no ecchymosis bilateral, all nails x 10 are elongated and minimally dystrophic.   Vascular:Decreased focal edema noted to right 2nd toe. Dorsalis Pedis and Posterior Tibial pedal pulses 1/4, Capillary Fill  Time 3 seconds,(-) pedal hair growth bilateral, Temperature gradient within normal limits.  Neurology: Gross sensation intact via light touch bilateral, Protective sensation absent with Thornell Mule Monofilament to all pedal sites, No babinski sign present bilateral. (- )Tinels sign right foot.   Musculoskeletal: There is decreased tenderness with palpation at 2nd toe on right, Varus lesser hammertoes. No pain with calf compression bilateral. All joint range of motion is within normal limits except at 2nd toe on right where there is pain, minimal pain to R>L fascial band with tightness, Strength within normal limits in all groups bilateral.   Gait: Unassisted, Antalgic gait  Xrays  Right Foot   Impression: Healed fracture at head of proximal phalanx of 2nd toe on right foot. No other acute findings.   Assessment and Plan: Problem List Items Addressed This Visit    None    Visit Diagnoses    Closed displaced fracture of proximal phalanx of lesser toe of right foot with nonunion, subsequent encounter    -  Primary   Relevant Orders   DG Foot Complete Right (Completed)   Right foot pain       Dermatophytosis of nail       Diabetic polyneuropathy associated with type 2 diabetes mellitus (HCC)       Foot pain, bilateral           -Complete examination performed -Xrays reviewed -Discussed continued care for healed fracture; risks, alternatives, and benefits explained. -May discontinue Exogen since fracture is healed  -Recommend continue with good supportive shoes  -Mechanically debrided all nails using sterile nail nipper without incident -Patient to return to office in 10-12 weeks for diabetic foot care or sooner if condition worsens.   Landis Martins, DPM

## 2017-09-23 ENCOUNTER — Other Ambulatory Visit: Payer: Self-pay | Admitting: Gastroenterology

## 2017-09-26 DIAGNOSIS — H4312 Vitreous hemorrhage, left eye: Secondary | ICD-10-CM | POA: Diagnosis not present

## 2017-09-26 DIAGNOSIS — H35373 Puckering of macula, bilateral: Secondary | ICD-10-CM | POA: Diagnosis not present

## 2017-09-26 DIAGNOSIS — H43822 Vitreomacular adhesion, left eye: Secondary | ICD-10-CM | POA: Diagnosis not present

## 2017-09-26 DIAGNOSIS — E103513 Type 1 diabetes mellitus with proliferative diabetic retinopathy with macular edema, bilateral: Secondary | ICD-10-CM | POA: Diagnosis not present

## 2017-10-15 ENCOUNTER — Other Ambulatory Visit: Payer: Self-pay

## 2017-10-15 ENCOUNTER — Telehealth: Payer: Self-pay | Admitting: Family Medicine

## 2017-10-15 MED ORDER — GLUCOSE BLOOD VI STRP: ORAL_STRIP | 1 refills | Status: DC

## 2017-10-15 MED ORDER — ONETOUCH DELICA LANCETS 33G MISC: 1 refills | Status: DC

## 2017-10-15 NOTE — Telephone Encounter (Signed)
Med refilled.

## 2017-10-15 NOTE — Telephone Encounter (Signed)
Patient is requesting one touch strips and lancets 400 count (90 day supply)  [facility]  Cb#: [phone]

## 2017-10-16 ENCOUNTER — Other Ambulatory Visit: Payer: Self-pay

## 2017-10-16 ENCOUNTER — Other Ambulatory Visit: Payer: Self-pay | Admitting: Family Medicine

## 2017-10-16 MED ORDER — ONETOUCH DELICA LANCETS 33G MISC: 1 refills | Status: DC

## 2017-10-16 MED ORDER — INSULIN PEN NEEDLE 32G X 4 MM MISC: 1 refills | Status: DC

## 2017-10-18 ENCOUNTER — Other Ambulatory Visit: Payer: Self-pay | Admitting: Family Medicine

## 2017-10-18 DIAGNOSIS — E785 Hyperlipidemia, unspecified: Secondary | ICD-10-CM | POA: Diagnosis not present

## 2017-10-18 DIAGNOSIS — E118 Type 2 diabetes mellitus with unspecified complications: Secondary | ICD-10-CM | POA: Diagnosis not present

## 2017-10-20 LAB — COMPLETE METABOLIC PANEL WITH GFR
AG Ratio: 1.4 (calc) (ref 1.0–2.5)
ALBUMIN MSPROF: 3.9 g/dL (ref 3.6–5.1)
ALT: 9 U/L (ref 6–29)
AST: 14 U/L (ref 10–35)
Alkaline phosphatase (APISO): 106 U/L (ref 33–130)
BUN/Creatinine Ratio: 13 (calc) (ref 6–22)
BUN: 17 mg/dL (ref 7–25)
CALCIUM: 9.8 mg/dL (ref 8.6–10.4)
CO2: 28 mmol/L (ref 20–32)
Chloride: 103 mmol/L (ref 98–110)
Creat: 1.28 mg/dL — ABNORMAL HIGH (ref 0.50–1.05)
GFR, EST AFRICAN AMERICAN: 56 mL/min/{1.73_m2} — AB (ref >=60)
GFR, EST NON AFRICAN AMERICAN: 49 mL/min/{1.73_m2} — AB (ref >=60)
GLUCOSE: 161 mg/dL — AB (ref 65–99)
Globulin: 2.7 g/dL (calc) (ref 1.9–3.7)
Potassium: 4.5 mmol/L (ref 3.5–5.3)
Sodium: 139 mmol/L (ref 135–146)
TOTAL PROTEIN: 6.6 g/dL (ref 6.1–8.1)
Total Bilirubin: 0.4 mg/dL (ref 0.2–1.2)

## 2017-10-20 LAB — HEMOGLOBIN A1C
EAG (MMOL/L): 10.6 (calc)
Hgb A1c MFr Bld: 8.3 % of total Hgb — ABNORMAL HIGH (ref <5.7)
Mean Plasma Glucose: 192 (calc)

## 2017-10-21 ENCOUNTER — Ambulatory Visit: Payer: PPO | Admitting: Family Medicine

## 2017-10-23 ENCOUNTER — Other Ambulatory Visit: Payer: Self-pay

## 2017-10-23 ENCOUNTER — Telehealth: Payer: Self-pay | Admitting: Family Medicine

## 2017-10-23 MED ORDER — GLUCOSE BLOOD VI STRP: ORAL_STRIP | 1 refills | Status: DC

## 2017-10-23 NOTE — Telephone Encounter (Signed)
Resent

## 2017-10-23 NOTE — Telephone Encounter (Signed)
Patient called in upset because she hasnt received her refills for needles, lancets, and test strips. I called [facility] to check the status of this request because according to our records they confirmed receiving all requests. I spoke w/ Barbaraann Faster, needles were delivered on 10/21/17 at 12:12pm. Lancets are on the way  Test strips were denied by Dr.Simpson according to [facility] at [facility]  Cb#: [phone] Fax# [phone]

## 2017-10-27 ENCOUNTER — Telehealth: Payer: Self-pay | Admitting: Family Medicine

## 2017-10-27 NOTE — Telephone Encounter (Signed)
Patient left a voicemail on the machine friday 10/24/17, states the pharmacy needs more information about her test strips.  Ashley Armstrong is addressing this now 10/27/17 9:03am (called [facility])

## 2017-10-28 ENCOUNTER — Other Ambulatory Visit: Payer: Self-pay | Admitting: Family Medicine

## 2017-10-28 ENCOUNTER — Other Ambulatory Visit: Payer: Self-pay | Admitting: Gastroenterology

## 2017-10-28 NOTE — Telephone Encounter (Signed)
Seen 9 5 18 

## 2017-11-12 ENCOUNTER — Telehealth: Payer: Self-pay | Admitting: Gastroenterology

## 2017-11-12 ENCOUNTER — Other Ambulatory Visit: Payer: Self-pay

## 2017-11-12 NOTE — Telephone Encounter (Signed)
Letter mailed

## 2017-11-12 NOTE — Telephone Encounter (Signed)
Recall for ruq ultrasound 

## 2017-11-19 ENCOUNTER — Telehealth: Payer: Self-pay | Admitting: Gastroenterology

## 2017-11-19 ENCOUNTER — Encounter: Payer: Self-pay | Admitting: *Deleted

## 2017-11-19 DIAGNOSIS — K824 Cholesterolosis of gallbladder: Secondary | ICD-10-CM

## 2017-11-19 NOTE — Telephone Encounter (Signed)
Ultrasound scheduled for 12/19/17 at 9:30am, arrival 9:15 am, NPO after midnight. Patient aware of appt details. Letter mailed to her. Confirmed with pt she still has [employer] insurance. No PA is required for U/S.

## 2017-11-19 NOTE — Telephone Encounter (Signed)
Pt received letter that it was time to schedule [place] of Abdomen. Please call her back at [phone]

## 2017-11-24 ENCOUNTER — Ambulatory Visit: Payer: PPO | Admitting: Family Medicine

## 2017-11-27 ENCOUNTER — Encounter: Payer: Self-pay | Admitting: Family Medicine

## 2017-11-27 ENCOUNTER — Ambulatory Visit (INDEPENDENT_AMBULATORY_CARE_PROVIDER_SITE_OTHER): Payer: PPO | Admitting: Family Medicine

## 2017-11-27 VITALS — BP 114/74 | HR 78 | Resp 16 | Ht 68.0 in | Wt 134.0 lb

## 2017-11-27 DIAGNOSIS — F418 Other specified anxiety disorders: Secondary | ICD-10-CM | POA: Diagnosis not present

## 2017-11-27 DIAGNOSIS — E785 Hyperlipidemia, unspecified: Secondary | ICD-10-CM | POA: Diagnosis not present

## 2017-11-27 DIAGNOSIS — E1065 Type 1 diabetes mellitus with hyperglycemia: Secondary | ICD-10-CM

## 2017-11-27 DIAGNOSIS — Z01419 Encounter for gynecological examination (general) (routine) without abnormal findings: Secondary | ICD-10-CM

## 2017-11-27 DIAGNOSIS — K3184 Gastroparesis: Secondary | ICD-10-CM | POA: Diagnosis not present

## 2017-11-27 DIAGNOSIS — K219 Gastro-esophageal reflux disease without esophagitis: Secondary | ICD-10-CM

## 2017-11-27 NOTE — Patient Instructions (Addendum)
Wellness with nurse April 4 or after  Fasting lipid, cmp and EGFr, hBA1c last week in March  MD follow up in end June  Goal is under 7.5 No sugar or flour  Pls schedule  Pap and get this done

## 2017-11-29 ENCOUNTER — Encounter: Payer: Self-pay | Admitting: Family Medicine

## 2017-11-29 NOTE — Progress Notes (Signed)
Ashley Armstrong     MRN: [id number]      DOB: Sep 06, 1966   HPI [name] is here for follow up and re-evaluation of chronic medical conditions, medication management and review of any available recent lab and radiology data.  Preventive health is updated, specifically  Cancer screening and Immunization.   Tests 4 times daily and in writing them down, she is steadily improving her HBA1C states she also feels better. Denies hypoglycemic episodes, and actually is now able to make sense of her numbers and how they relate to her diet, increasingly motivated to getting her blood sugar right Has all of her log sheets for the past several months where she tests and records 4 times daily, one is being scanned into her record,  ROS Denies recent fever or chills. Denies sinus pressure, nasal congestion, ear pain or sore throat. Denies chest congestion, productive cough or wheezing. Denies chest pains, palpitations and leg swelling C/o chronic GERD  And nausea which she takes medication for.   Denies dysuria, frequency, hesitancy or incontinence. Denies joint pain, swelling and limitation in mobility.  Denies uncontrolled  depression or , anxiety c/o insomnia. Denies skin break down or rash.c/o thickened toenail  PE  BP 114/74   Pulse 78   Resp 16   Ht 5\' 8"  (1.727 m)   Wt 134 lb (60.8 kg)   LMP 07/26/2013   SpO2 98%   BMI 20.37 kg/m   Patient alert and oriented and in no cardiopulmonary distress.  HEENT: No facial asymmetry, EOMI,   oropharynx pink and moist.  Neck supple no JVD, no mass.  Chest: Clear to auscultation bilaterally.  CVS: S1, S2 no murmurs, no S3.Regular rate.  ABD: Soft non tender.   Ext: No edema  MS: Adequate ROM spine, shoulders, hips and knees.  Skin: Intact, no ulcerations or rash noted.  Psych: Good eye contact, normal affect. Memory intact not anxious or depressed appearing.  CNS: CN 2-12 intact, power,  normal throughout.no focal deficits  noted.   Assessment & Plan  DIABETES MELLITUS, WITH RENAL COMPLICATIONS Improving gradually, pt applauded and encourafged to aim for hBA1C of 7.5 or less at next visit [name] is reminded of the importance of commitment to daily physical activity for 30 minutes or more, as able and the need to limit carbohydrate intake to 30 to 60 grams per meal to help with blood sugar control.   The need to take medication as prescribed, test blood sugar as directed, and to call between visits if there is a concern that blood sugar is uncontrolled is also discussed.   [name] is reminded of the importance of daily foot exam, annual eye examination, and good blood sugar, blood pressure and cholesterol control.  Diabetic Labs Latest Ref Rng & Units 10/18/2017 07/17/2017 07/11/2017 04/15/2017 01/15/2017  HbA1c <5.7 % of total Hgb 8.3(H) 8.7(H) - 9.2(H) 8.8(H)  Microalbumin Not estab mg/dL - - CANCELED - -  Micro/Creat Ratio <30 mcg/mg creat - - CANCELED - -  Chol <200 mg/dL - - 183 180 -  HDL >50 mg/dL - - 59 63 -  Calc LDL <100 mg/dL - - 107(H) 87 -  Triglycerides <150 mg/dL - - 87 148 -  Creatinine 0.50 - 1.05 mg/dL 1.28(H) - 1.38(H) 1.15(H) 1.07   BP/Weight 11/27/2017 07/16/2017 04/15/2017 03/26/2017 02/12/2017 12/21/4707 04/12/8365  Systolic BP 294 765 90 97 465 035 465  Diastolic BP 74 72 60 66 70 76 70  Wt. (Lbs)  134 131.25 130 128.8 128.08 128 126  BMI 20.37 19.96 19.77 19.58 19.47 19.46 19.16   Foot/eye exam completion dates Latest Ref Rng & Units 11/27/2017 07/04/2017  Eye Exam No Retinopathy - Retinopathy(A)  Foot Form Completion - Done -        Hyperlipidemia LDL goal <100 Hyperlipidemia:Low fat diet discussed and encouraged.   Lipid Panel  Lab Results  Component Value Date   CHOL 183 07/11/2017   HDL 59 07/11/2017   LDLCALC 107 (H) 07/11/2017   TRIG 87 07/11/2017   CHOLHDL 3.1 07/11/2017    Updated lab needed at/ before next visit.   GERD (gastroesophageal reflux disease) Managed by  GI and she reports fair control  Gastroparesis Improving as blood sugar is improving  Depression with anxiety Reports insomnia, otherwise doing fairly well, psychiatry treats the patient, she is not suicidal or homicidal

## 2017-11-29 NOTE — Assessment & Plan Note (Signed)
Reports insomnia, otherwise doing fairly well, psychiatry treats the patient, she is not suicidal or homicidal

## 2017-11-29 NOTE — Assessment & Plan Note (Signed)
Hyperlipidemia:Low fat diet discussed and encouraged.   Lipid Panel  Lab Results  Component Value Date   CHOL 183 07/11/2017   HDL 59 07/11/2017   LDLCALC 107 (H) 07/11/2017   TRIG 87 07/11/2017   CHOLHDL 3.1 07/11/2017    Updated lab needed at/ before next visit.

## 2017-11-29 NOTE — Assessment & Plan Note (Signed)
Improving as blood sugar is improving

## 2017-11-29 NOTE — Assessment & Plan Note (Signed)
Improving gradually, pt applauded and encourafged to aim for hBA1C of 7.5 or less at next visit Ashley Armstrong is reminded of the importance of commitment to daily physical activity for 30 minutes or more, as able and the need to limit carbohydrate intake to 30 to 60 grams per meal to help with blood sugar control.   The need to take medication as prescribed, test blood sugar as directed, and to call between visits if there is a concern that blood sugar is uncontrolled is also discussed.   Ashley Armstrong is reminded of the importance of daily foot exam, annual eye examination, and good blood sugar, blood pressure and cholesterol control.  Diabetic Labs Latest Ref Rng & Units 10/18/2017 07/17/2017 07/11/2017 04/15/2017 01/15/2017  HbA1c <5.7 % of total Hgb 8.3(H) 8.7(H) - 9.2(H) 8.8(H)  Microalbumin Not estab mg/dL - - CANCELED - -  Micro/Creat Ratio <30 mcg/mg creat - - CANCELED - -  Chol <200 mg/dL - - 183 180 -  HDL >50 mg/dL - - 59 63 -  Calc LDL <100 mg/dL - - 107(H) 87 -  Triglycerides <150 mg/dL - - 87 148 -  Creatinine 0.50 - 1.05 mg/dL 1.28(H) - 1.38(H) 1.15(H) 1.07   BP/Weight 11/27/2017 07/16/2017 04/15/2017 03/26/2017 02/12/2017 1/76/1607 01/15/1061  Systolic BP 694 854 90 97 627 035 009  Diastolic BP 74 72 60 66 70 76 70  Wt. (Lbs) 134 131.25 130 128.8 128.08 128 126  BMI 20.37 19.96 19.77 19.58 19.47 19.46 19.16   Foot/eye exam completion dates Latest Ref Rng & Units 11/27/2017 07/04/2017  Eye Exam No Retinopathy - Retinopathy(A)  Foot Form Completion - Done -

## 2017-11-29 NOTE — Assessment & Plan Note (Signed)
Managed by GI and she reports fair control

## 2017-12-02 DIAGNOSIS — F329 Major depressive disorder, single episode, unspecified: Secondary | ICD-10-CM | POA: Diagnosis not present

## 2017-12-09 ENCOUNTER — Other Ambulatory Visit: Payer: Self-pay | Admitting: Family Medicine

## 2017-12-10 ENCOUNTER — Telehealth: Payer: Self-pay | Admitting: Family Medicine

## 2017-12-10 NOTE — Telephone Encounter (Signed)
Pt needs 17 days one box, pt needs 2 box per pharmacy to make it a 30 day supply, this is for lancet.

## 2017-12-16 ENCOUNTER — Ambulatory Visit: Payer: PPO | Admitting: Sports Medicine

## 2017-12-16 NOTE — Telephone Encounter (Signed)
close

## 2017-12-19 ENCOUNTER — Ambulatory Visit (HOSPITAL_COMMUNITY): Admission: RE | Admit: 2017-12-19 | Payer: PPO | Source: Ambulatory Visit

## 2017-12-25 ENCOUNTER — Ambulatory Visit (HOSPITAL_COMMUNITY)
Admission: RE | Admit: 2017-12-25 | Discharge: 2017-12-25 | Disposition: A | Discharge status: Home / Self Care | Payer: PPO | Source: Ambulatory Visit | Attending: Gastroenterology | Admitting: Gastroenterology

## 2017-12-25 DIAGNOSIS — K824 Cholesterolosis of gallbladder: Secondary | ICD-10-CM | POA: Insufficient documentation

## 2018-01-01 ENCOUNTER — Other Ambulatory Visit: Payer: Self-pay | Admitting: Family Medicine

## 2018-01-01 ENCOUNTER — Other Ambulatory Visit: Payer: Self-pay

## 2018-01-01 MED ORDER — INSULIN ASPART 100 UNIT/ML FLEXPEN: PEN_INJECTOR | SUBCUTANEOUS | 5 refills | Status: DC

## 2018-01-02 ENCOUNTER — Encounter: Payer: Self-pay | Admitting: Family Medicine

## 2018-01-02 DIAGNOSIS — E103513 Type 1 diabetes mellitus with proliferative diabetic retinopathy with macular edema, bilateral: Secondary | ICD-10-CM | POA: Diagnosis not present

## 2018-01-02 DIAGNOSIS — H43822 Vitreomacular adhesion, left eye: Secondary | ICD-10-CM | POA: Diagnosis not present

## 2018-01-02 DIAGNOSIS — H4312 Vitreous hemorrhage, left eye: Secondary | ICD-10-CM | POA: Diagnosis not present

## 2018-01-02 DIAGNOSIS — H35373 Puckering of macula, bilateral: Secondary | ICD-10-CM | POA: Diagnosis not present

## 2018-01-02 LAB — HM DIABETES EYE EXAM

## 2018-01-04 NOTE — Progress Notes (Signed)
Gallbladder polyp remains unchanged since 2016. No further work up needed.

## 2018-01-05 NOTE — Progress Notes (Signed)
Pt is aware.  

## 2018-01-09 DIAGNOSIS — Z9889 Other specified postprocedural states: Secondary | ICD-10-CM

## 2018-01-09 HISTORY — PX: EYE SURGERY: SHX253

## 2018-01-09 HISTORY — DX: Other specified postprocedural states: Z98.890

## 2018-01-19 DIAGNOSIS — E113512 Type 2 diabetes mellitus with proliferative diabetic retinopathy with macular edema, left eye: Secondary | ICD-10-CM | POA: Diagnosis not present

## 2018-01-19 DIAGNOSIS — E103512 Type 1 diabetes mellitus with proliferative diabetic retinopathy with macular edema, left eye: Secondary | ICD-10-CM | POA: Diagnosis not present

## 2018-01-19 DIAGNOSIS — H35372 Puckering of macula, left eye: Secondary | ICD-10-CM | POA: Diagnosis not present

## 2018-01-20 DIAGNOSIS — E103512 Type 1 diabetes mellitus with proliferative diabetic retinopathy with macular edema, left eye: Secondary | ICD-10-CM | POA: Diagnosis not present

## 2018-01-26 DIAGNOSIS — E103512 Type 1 diabetes mellitus with proliferative diabetic retinopathy with macular edema, left eye: Secondary | ICD-10-CM | POA: Diagnosis not present

## 2018-01-30 ENCOUNTER — Other Ambulatory Visit: Payer: Self-pay | Admitting: Family Medicine

## 2018-02-03 ENCOUNTER — Telehealth: Payer: Self-pay | Admitting: Family Medicine

## 2018-02-03 MED ORDER — ONETOUCH DELICA LANCETS 33G MISC: 1 refills | Status: DC

## 2018-02-03 MED ORDER — GLUCOSE BLOOD VI STRP: ORAL_STRIP | 1 refills | Status: DC

## 2018-02-03 MED ORDER — INSULIN PEN NEEDLE 32G X 4 MM MISC: 1 refills | Status: DC

## 2018-02-03 NOTE — Telephone Encounter (Signed)
Onetouch Delica lancets 60R Insulin Pen Needles  Ultra test --[facility]- and she needs diabetic logs --please leave at front desk

## 2018-02-03 NOTE — Telephone Encounter (Signed)
Done

## 2018-02-04 NOTE — Telephone Encounter (Signed)
They were sent in for 3 months.

## 2018-02-04 NOTE — Telephone Encounter (Signed)
Please call in for 92mths

## 2018-02-12 DIAGNOSIS — E1321 Other specified diabetes mellitus with diabetic nephropathy: Secondary | ICD-10-CM | POA: Diagnosis not present

## 2018-02-12 DIAGNOSIS — E785 Hyperlipidemia, unspecified: Secondary | ICD-10-CM | POA: Diagnosis not present

## 2018-02-13 LAB — COMPLETE METABOLIC PANEL WITH GFR
AG RATIO: 1.5 (calc) (ref 1.0–2.5)
ALKALINE PHOSPHATASE (APISO): 99 U/L (ref 33–130)
ALT: 14 U/L (ref 6–29)
AST: 17 U/L (ref 10–35)
Albumin: 3.8 g/dL (ref 3.6–5.1)
BILIRUBIN TOTAL: 0.4 mg/dL (ref 0.2–1.2)
BUN/Creatinine Ratio: 15 (calc) (ref 6–22)
BUN: 17 mg/dL (ref 7–25)
CHLORIDE: 105 mmol/L (ref 98–110)
CO2: 32 mmol/L (ref 20–32)
Calcium: 9.2 mg/dL (ref 8.6–10.4)
Creat: 1.14 mg/dL — ABNORMAL HIGH (ref 0.50–1.05)
GFR, Est African American: 65 mL/min/{1.73_m2} (ref >=60)
GFR, Est Non African American: 56 mL/min/{1.73_m2} — ABNORMAL LOW (ref >=60)
GLUCOSE: 170 mg/dL — AB (ref 65–99)
Globulin: 2.6 g/dL (calc) (ref 1.9–3.7)
POTASSIUM: 4.3 mmol/L (ref 3.5–5.3)
Sodium: 142 mmol/L (ref 135–146)
Total Protein: 6.4 g/dL (ref 6.1–8.1)

## 2018-02-13 LAB — HEMOGLOBIN A1C
EAG (MMOL/L): 11.6 (calc)
HEMOGLOBIN A1C: 8.9 %{Hb} — AB (ref <5.7)
MEAN PLASMA GLUCOSE: 209 (calc)

## 2018-02-13 LAB — LIPID PANEL
CHOL/HDL RATIO: 2.7 (calc) (ref <5.0)
Cholesterol: 197 mg/dL (ref <200)
HDL: 72 mg/dL (ref >50)
LDL Cholesterol (Calc): 106 mg/dL (calc) — ABNORMAL HIGH
Non-HDL Cholesterol (Calc): 125 mg/dL (calc) (ref <130)
TRIGLYCERIDES: 92 mg/dL (ref <150)

## 2018-02-16 ENCOUNTER — Ambulatory Visit: Payer: PPO

## 2018-02-17 DIAGNOSIS — E103513 Type 1 diabetes mellitus with proliferative diabetic retinopathy with macular edema, bilateral: Secondary | ICD-10-CM | POA: Diagnosis not present

## 2018-02-19 DIAGNOSIS — F329 Major depressive disorder, single episode, unspecified: Secondary | ICD-10-CM | POA: Diagnosis not present

## 2018-02-26 ENCOUNTER — Telehealth: Payer: Self-pay

## 2018-02-26 DIAGNOSIS — E1165 Type 2 diabetes mellitus with hyperglycemia: Secondary | ICD-10-CM

## 2018-02-26 NOTE — Telephone Encounter (Signed)
-----   Message from Fayrene Helper, MD sent at 02/13/2018  3:20 AM EDT ----- Pleaseexpalin to ptient that her blood sugar has deteriorated, she NEEDS to be treated by an endocrinologist  She needs to let me know if now that she is taking her health so seriously if she will return to Dr Dorris Fetch, since he is local I recommend that she do that, plks refer her I will sign if she agrees.to seeing him, however she ABSOLUTELY needs endo to treat her diabetois

## 2018-03-09 ENCOUNTER — Other Ambulatory Visit: Payer: Self-pay

## 2018-03-09 ENCOUNTER — Telehealth: Payer: Self-pay | Admitting: Family Medicine

## 2018-03-09 MED ORDER — GLUCOSE BLOOD VI STRP: ORAL_STRIP | 1 refills | Status: DC

## 2018-03-09 MED ORDER — ONETOUCH DELICA LANCETS 33G MISC: 1 refills | Status: DC

## 2018-03-09 NOTE — Telephone Encounter (Signed)
Patient says she only received 150 test strips to [facility], and she usually receives 350.Marland Kitchen She said they can only do 350 max, not 400.  [facility]

## 2018-03-09 NOTE — Telephone Encounter (Signed)
Done. Sent in for 350

## 2018-03-11 ENCOUNTER — Ambulatory Visit (INDEPENDENT_AMBULATORY_CARE_PROVIDER_SITE_OTHER): Payer: PPO

## 2018-03-11 VITALS — BP 114/54 | HR 83 | Temp 98.2°F | Resp 14 | Ht 68.0 in | Wt 136.1 lb

## 2018-03-11 DIAGNOSIS — Z0000 Encounter for general adult medical examination without abnormal findings: Secondary | ICD-10-CM

## 2018-03-11 NOTE — Patient Instructions (Addendum)
Ashley Armstrong , Thank you for taking time to come for your Medicare Wellness Visit. I appreciate your ongoing commitment to your health goals. Please review the following plan we discussed and let me know if I can assist you in the future.   Screening recommendations/referrals: Colonoscopy: Due 2027 Mammogram: UTD (Next due June 2020) Bone Density:You're not eligible yet for this test Recommended yearly ophthalmology/optometry visit for glaucoma screening and checkup: This is up to date, you've had eye surgery in March, keep all of your follow up appointments with your specialist Recommended yearly dental visit for hygiene and checkup: UTD  Vaccinations: Influenza vaccine: Due in the Fall of 2019 Pneumococcal vaccine: Call your insurance company to see if this is a vaccine they will cover Tdap vaccine: UTD (Due 2025) Shingles vaccine: Call your insurance company to see if this is a vaccine they will cover   Advanced directives: We discussed this during your visit. If you change your mind about getting the paperwork, please do not hesitate to ask our office for a packet and we will give it to you to complete.   Conditions/risks identified: Discussed during your visit  Next appointment: May 04, 2018 at 9:00am with Dr.Simpson.   Preventive Care 40-64 Years, Female Preventive care refers to lifestyle choices and visits with your health care provider that can promote health and wellness. What does preventive care include?  A yearly physical exam. This is also called an annual well check.  Dental exams once or twice a year.  Routine eye exams. Ask your health care provider how often you should have your eyes checked.  Personal lifestyle choices, including:  Daily care of your teeth and gums.  Regular physical activity.  Eating a healthy diet.  Avoiding tobacco and drug use.  Limiting alcohol use.  Practicing safe sex.  Taking low-dose aspirin daily starting at age 86.  Taking  vitamin and mineral supplements as recommended by your health care provider. What happens during an annual well check? The services and screenings done by your health care provider during your annual well check will depend on your age, overall health, lifestyle risk factors, and family history of disease. Counseling  Your health care provider may ask you questions about your:  Alcohol use.  Tobacco use.  Drug use.  Emotional well-being.  Home and relationship well-being.  Sexual activity.  Eating habits.  Work and work Statistician.  Method of birth control.  Menstrual cycle.  Pregnancy history. Screening  You may have the following tests or measurements:  Height, weight, and BMI.  Blood pressure.  Lipid and cholesterol levels. These may be checked every 5 years, or more frequently if you are over 77 years old.  Skin check.  Lung cancer screening. You may have this screening every year starting at age 61 if you have a 30-pack-year history of smoking and currently smoke or have quit within the past 15 years.  Fecal occult blood test (FOBT) of the stool. You may have this test every year starting at age 34.  Flexible sigmoidoscopy or colonoscopy. You may have a sigmoidoscopy every 5 years or a colonoscopy every 10 years starting at age 39.  Hepatitis C blood test.  Hepatitis B blood test.  Sexually transmitted disease (STD) testing.  Diabetes screening. This is done by checking your blood sugar (glucose) after you have not eaten for a while (fasting). You may have this done every 1-3 years.  Mammogram. This may be done every 1-2 years. Talk to your  health care provider about when you should start having regular mammograms. This may depend on whether you have a family history of breast cancer.  BRCA-related cancer screening. This may be done if you have a family history of breast, ovarian, tubal, or peritoneal cancers.  Pelvic exam and Pap test. This may be done  every 3 years starting at age 63. Starting at age 59, this may be done every 5 years if you have a Pap test in combination with an HPV test.  Bone density scan. This is done to screen for osteoporosis. You may have this scan if you are at high risk for osteoporosis. Discuss your test results, treatment options, and if necessary, the need for more tests with your health care provider. Vaccines  Your health care provider may recommend certain vaccines, such as:  Influenza vaccine. This is recommended every year.  Tetanus, diphtheria, and acellular pertussis (Tdap, Td) vaccine. You may need a Td booster every 10 years.  Zoster vaccine. You may need this after age 75.  Pneumococcal 13-valent conjugate (PCV13) vaccine. You may need this if you have certain conditions and were not previously vaccinated.  Pneumococcal polysaccharide (PPSV23) vaccine. You may need one or two doses if you smoke cigarettes or if you have certain conditions. Talk to your health care provider about which screenings and vaccines you need and how often you need them. This information is not intended to replace advice given to you by your health care provider. Make sure you discuss any questions you have with your health care provider. Document Released: 11/24/2015 Document Revised: 07/17/2016 Document Reviewed: 08/29/2015 Elsevier Interactive Patient Education  2017 [place] Prevention in the Home Falls can cause injuries. They can happen to people of all ages. There are many things you can do to make your home safe and to help prevent falls. What can I do on the outside of my home?  Regularly fix the edges of walkways and driveways and fix any cracks.  Remove anything that might make you trip as you walk through a door, such as a raised step or threshold.  Trim any bushes or trees on the path to your home.  Use bright outdoor lighting.  Clear any walking paths of anything that might make someone  trip, such as rocks or tools.  Regularly check to see if handrails are loose or broken. Make sure that both sides of any steps have handrails.  Any raised decks and porches should have guardrails on the edges.  Have any leaves, snow, or ice cleared regularly.  Use sand or salt on walking paths during winter.  Clean up any spills in your garage right away. This includes oil or grease spills. What can I do in the bathroom?  Use night lights.  Install grab bars by the toilet and in the tub and shower. Do not use towel bars as grab bars.  Use non-skid mats or decals in the tub or shower.  If you need to sit down in the shower, use a plastic, non-slip stool.  Keep the floor dry. Clean up any water that spills on the floor as soon as it happens.  Remove soap buildup in the tub or shower regularly.  Attach bath mats securely with double-sided non-slip rug tape.  Do not have throw rugs and other things on the floor that can make you trip. What can I do in the bedroom?  Use night lights.  Make sure that you have a  light by your bed that is easy to reach.  Do not use any sheets or blankets that are too big for your bed. They should not hang down onto the floor.  Have a firm chair that has side arms. You can use this for support while you get dressed.  Do not have throw rugs and other things on the floor that can make you trip. What can I do in the kitchen?  Clean up any spills right away.  Avoid walking on wet floors.  Keep items that you use a lot in easy-to-reach places.  If you need to reach something above you, use a strong step stool that has a grab bar.  Keep electrical cords out of the way.  Do not use floor polish or wax that makes floors slippery. If you must use wax, use non-skid floor wax.  Do not have throw rugs and other things on the floor that can make you trip. What can I do with my stairs?  Do not leave any items on the stairs.  Make sure that there  are handrails on both sides of the stairs and use them. Fix handrails that are broken or loose. Make sure that handrails are as long as the stairways.  Check any carpeting to make sure that it is firmly attached to the stairs. Fix any carpet that is loose or worn.  Avoid having throw rugs at the top or bottom of the stairs. If you do have throw rugs, attach them to the floor with carpet tape.  Make sure that you have a light switch at the top of the stairs and the bottom of the stairs. If you do not have them, ask someone to add them for you. What else can I do to help prevent falls?  Wear shoes that:  Do not have high heels.  Have rubber bottoms.  Are comfortable and fit you well.  Are closed at the toe. Do not wear sandals.  If you use a stepladder:  Make sure that it is fully opened. Do not climb a closed stepladder.  Make sure that both sides of the stepladder are locked into place.  Ask someone to hold it for you, if possible.  Clearly mark and make sure that you can see:  Any grab bars or handrails.  First and last steps.  Where the edge of each step is.  Use tools that help you move around (mobility aids) if they are needed. These include:  Canes.  Walkers.  Scooters.  Crutches.  Turn on the lights when you go into a dark area. Replace any light bulbs as soon as they burn out.  Set up your furniture so you have a clear path. Avoid moving your furniture around.  If any of your floors are uneven, fix them.  If there are any pets around you, be aware of where they are.  Review your medicines with your doctor. Some medicines can make you feel dizzy. This can increase your chance of falling. Ask your doctor what other things that you can do to help prevent falls. This information is not intended to replace advice given to you by your health care provider. Make sure you discuss any questions you have with your health care provider. Document Released:  08/24/2009 Document Revised: 04/04/2016 Document Reviewed: 12/02/2014 Elsevier Interactive Patient Education  2017 [employer].

## 2018-03-11 NOTE — Progress Notes (Signed)
Subjective:   Ashley Armstrong is a 52 y.o. female who presents for Medicare Annual (Subsequent) preventive examination.  Review of Systems:   Cardiac Risk Factors include: diabetes mellitus     Objective:     Vitals: BP (!) 114/54 (BP Location: Left Arm, Patient Position: Sitting, Cuff Size: Normal)   Pulse 83   Temp 98.2 F (36.8 C) (Oral)   Resp 14   Ht _0  (1.727 m)   Wt 136 lb 1.9 oz (61.7 kg)   LMP 07/26/2013   SpO2 97%   BMI 20.70 kg/m   Body mass index is 20.7 kg/m.  Advanced Directives 03/11/2018 02/12/2017 11/05/2016 05/09/2016 07/27/2015 04/08/2015 04/28/2013  Does Patient Have a Medical Advance Directive? _1  No Patient does not have advance directive  Would patient like information on creating a medical advance directive? No - Patient declined Yes (MAU/Ambulatory/Procedural Areas - Information given) No - Patient declined No - patient declined information - No - patient declined information -  Pre-existing out of facility DNR order (yellow form or pink MOST form) - - - - - - No    Tobacco Social History   Tobacco Use  Smoking Status Former Smoker  . Packs/day: 1.50  . Years: 24.00  . Pack years: 36.00  . Types: Cigarettes  . Last attempt to quit: 08/24/2011  . Years since quitting: 6.5  Smokeless Tobacco Never Used     Counseling given: Yes   Clinical Intake:  Pre-visit preparation completed: Yes  Pain : 0-10 Pain Score: 5  Pain Type: Chronic pain Pain Location: Neck(neck and head) Pain Radiating Towards: head C4-C5 Pain Descriptors / Indicators: Aching, Constant Pain Onset: More than a month ago Pain Frequency: Constant Pain Relieving Factors: when patient rubs it and Tylenol slows it down some Effect of Pain on Daily Activities: Patient has a lot of trouble completing daily activities.   Pain Relieving Factors: when patient rubs it and Tylenol slows it down some  BMI - recorded: 20.7 Nutritional Status: BMI of 19-24   Normal Diabetes: Yes CBG done?: No Did pt. bring in CBG monitor from home?: No  How often do you need to have someone help you when you read instructions, pamphlets, or other written materials from your doctor or pharmacy?: 3 - Sometimes(has trouble concentrating) What is the last grade level you completed in school?: 9th  Interpreter Needed?: No  Information entered by :: Vilinda Blanks  Past Medical History:  Diagnosis Date  . Anxiety   . Dyslipidemia   . Gastroparesis due to DM ([place]) OCT 2014   75% AT 2 HRS, GLU >   . Headache(784.0)   . IDDM (insulin dependent diabetes mellitus) ([place])   . Neuropathy   . Nicotine addiction   . TIA (transient ischemic attack)    Dr. Merlene Laughter   Past Surgical History:  Procedure Laterality Date  . Cataract surgery Bilateral 01/20/2017, 02/03/2017  . COLONOSCOPY N/A 05/09/2016   Procedure: COLONOSCOPY;  Surgeon: Danie Binder, MD;  Location: [facility];  Service: Endoscopy;  Laterality: N/A;  130  . ESOPHAGOGASTRODUODENOSCOPY N/A 08/31/2013   SLF: 1. E[place]y satiety nausea may be due to Gastroparesis/pyloric channel stenosis. 2. small hiatal hernia 3. Moderate erosive gastritis.   Marland Kitchen laser surgery bilateral eye     Family History  Problem Relation Age of Onset  . Hypertension Mother   . Diabetes Mother   . Hyperlipidemia Mother   . Rosacea Mother   . Hypertension  Father   . Diabetes Brother   . Colon cancer Neg Hx    Social History   Socioeconomic History  . Marital status: Single    Spouse name: Not on file  . Number of children: Not on file  . Years of education: Not on file  . Highest education level: Not on file  Occupational History  . Occupation: disabled  Social Needs  . Financial resource strain: Not very hard  . Food insecurity:    Worry: Sometimes true    Inability: Sometimes true  . Transportation needs:    Medical: No    Non-medical: No  Tobacco Use  . Smoking status: Former Smoker    Packs/day: 1.50    Years:  24.00    Pack years: 36.00    Types: Cigarettes    Last attempt to quit: 08/24/2011    Years since quitting: 6.5  . Smokeless tobacco: Never Used  Substance and Sexual Activity  . Alcohol use: No  . Drug use: No  . Sexual activity: Not Currently    Birth control/protection: None  Lifestyle  . Physical activity:    Days per week: 2 days    Minutes per session: 20 min  . Stress: Very much  Relationships  . Social connections:    Talks on phone: More than three times a week    Gets together: More than three times a week    Attends religious service: More than 4 times per year    Active member of club or organization: No    Attends meetings of clubs or organizations: Never    Relationship status: Never married  Other Topics Concern  . Not on file  Social History Narrative   Disabled, lives with a room mate    Outpatient Encounter Medications as of 03/11/2018  Medication Sig  . azelastine (ASTELIN) 0.1 % nasal spray Place 2 sprays into both nostrils 2 (two) times daily. Use in each nostril as directed (Patient taking differently: Place 2 sprays into both nostrils 2 (two) times daily as needed for allergies. Use in each nostril as directed)  . Blood Glucose Monitoring Suppl (ONE TOUCH ULTRA MINI) w/Device KIT Use as directed  . busPIRone (BUSPAR) 10 MG tablet Take 5 mg by mouth 2 (two) times daily.  . calcium-vitamin D (OSCAL WITH D) 500-200 MG-UNIT TABS tablet Take 1 tablet by mouth daily.  . diazepam (VALIUM) 10 MG tablet Take 10 mg by mouth daily as needed for anxiety.   . DUREZOL 0.05 % EMUL Apply 1 drop to eye daily at 8 pm.  . glucose blood (ONE TOUCH ULTRA TEST) test strip Use as instructed 4 times daily dx e11.65  . insulin aspart (NOVOLOG FLEXPEN) 100 UNIT/ML FlexPen INJECT 10 TO 20 UNITS THREE TIMES A DAY WITH MEALS  . Insulin Pen Needle (SURE COMFORT PEN NEEDLES) 32G X 4 MM MISC Use daily with insulin 4 times a day  . LANTUS SOLOSTAR 100 UNIT/ML Solostar Pen INJECT 90  UNITS INTO THE SKIN DAILY  . linaclotide (LINZESS) 72 MCG capsule 1 PO 30 mins WITH your first meal  . lisinopril (PRINIVIL,ZESTRIL) 2.5 MG tablet TAKE ONE (1) TABLET BY MOUTH EVERY DAY  . metoCLOPramide (REGLAN) 5 MG tablet TAKE ONE TO TWO TABLETS EVERY SIX HOURS AS NEEDED FOR NAUSEA OR VOMITING  . mirtazapine (REMERON) 30 MG tablet Take 1 tablet by mouth daily at 8 pm.  . mometasone (NASONEX) 50 MCG/ACT nasal spray Place 2 sprays into the nose daily. (  Patient taking differently: Place 2 sprays into the nose daily as needed (Allergies). )  . montelukast (SINGULAIR) 10 MG tablet TAKE ONE TABLET BY MOUTH EVERY NIGHT AT BEDTIME  . Omega-3 Fatty Acids (FISH OIL) 1000 MG CAPS Take 1 capsule by mouth daily.  Marland Kitchen omeprazole (PRILOSEC) 40 MG capsule TAKE ONE CAPSULE BY MOUTH 30 MINUTES PRIOR TO BREAKFAST AND SUPPER.  Marland Kitchen ondansetron (ZOFRAN) 4 MG tablet TAKE ONE TABLET BY MOUTH EVERY EIGHT HOURS AS NEEDED FOR NAUSEA OR VOMITING  . ONETOUCH DELICA LANCETS 77A MISC USE FOUR TIMES A DAY AS DIRECTED.  Marland Kitchen ONETOUCH DELICA LANCETS 12I MISC Test four times daily dx e11.65  . pravastatin (PRAVACHOL) 80 MG tablet TAKE ONE TABLET BY MOUTH IN THE EVENING.  . vitamin B-12 (CYANOCOBALAMIN) 100 MCG tablet Take 1 tablet by mouth daily.  . DULoxetine (CYMBALTA) 60 MG capsule TAKE ONE (1) CAPSULE EACH DAY (Patient not taking: Reported on 03/11/2018)   No facility-administered encounter medications on file as of 03/11/2018.     Activities of Daily Living In your present state of health, do you have any difficulty performing the following activities: 03/11/2018  Hearing? N  Vision? Y  Difficulty concentrating or making decisions? Y  Walking or climbing stairs? Y  Comment sometimes  Dressing or bathing? N  Doing errands, shopping? Y  Preparing Food and eating ? N  Using the Toilet? N  In the past six months, have you accidently leaked urine? Y  Do you have problems with loss of bowel control? Y  Managing your Medications?  N  Managing your Finances? N  Housekeeping or managing your Housekeeping? Y  Comment room mate helps out  Some recent data might be hidden    Patient Care Team: Fayrene Helper, MD as PCP - [facility], Marga Melnick, MD as Consulting Physician (Gastroenterology) Sherlynn Stalls, MD as Consulting Physician (Ophthalmology) Darleen Crocker, MD as Consulting Physician (Ophthalmology) Herminio Commons, MD as Attending Physician (Cardiology) Landis Martins, DPM as Consulting Physician (Podiatry)    Assessment:   This is a routine wellness examination for [place].  Exercise Activities and Dietary recommendations Current Exercise Habits: The patient does not participate in regular exercise at present, Exercise limited by: orthopedic condition(s)  Goals    . Exercise 3x per week (30 min per time)     Recommend starting a routine exercise program at least 3 days a week for 30-45 minutes at a time as tolerated.         Fall Risk Fall Risk  03/11/2018 07/16/2017 02/12/2017 07/27/2015  Falls in the past year? No No No No   Is the patient's home free of loose throw rugs in walkways, pet beds, electrical cords, etc?   yes      Grab bars in the bathroom? no      Handrails on the stairs?   no single level home      Adequate lighting?   no not enough in the kitchen   Depression Screen PHQ 2/9 Scores 03/11/2018 07/16/2017 02/12/2017 09/23/2016  PHQ - 2 Score _0 PHQ- 9 Score _1 Cognitive Function     6CIT Screen 03/11/2018 02/12/2017  What Year? 0 points 0 points  What month? 0 points 0 points  What time? 0 points 0 points  Count back from 20 0 points 0 points  Months in reverse 0 points 0 points  Repeat phrase 0 points 0 points  Total Score  0 0    Immunization History  Administered Date(s) Administered  . H1N1 10/24/2008  . Influenza Whole 09/24/2007, 08/09/2010  . Influenza,inj,Quad PF,6+ Mos 07/29/2013, 10/18/2014, 08/29/2015, 09/23/2016, 07/16/2017  . Pneumococcal  Polysaccharide-23 03/19/2013  . Td 04/11/2004  . Tdap 06/16/2014    Qualifies for Shingles Vaccine?Patient will call her insurance company to see if this is a covered vaccine.  Screening Tests Health Maintenance  Topic Date Due  . PAP SMEAR  07/29/2016  . PNEUMOCOCCAL POLYSACCHARIDE VACCINE (2) 03/19/2018  . INFLUENZA VACCINE  06/11/2018  . HEMOGLOBIN A1C  08/14/2018  . FOOT EXAM  11/29/2018  . OPHTHALMOLOGY EXAM  01/02/2019  . MAMMOGRAM  05/09/2019  . TETANUS/TDAP  06/16/2024  . COLONOSCOPY  05/09/2026  . HIV Screening  Completed    Cancer Screenings: Lung: Low Dose CT Chest recommended if Age 58-80 years, 30 pack-year currently smoking OR have quit w/in 15years. Patient does not qualify. Breast:  Up to date on Mammogram? Yes   Up to date of Bone Density/Dexa? No does not qualify Colorectal: UTD     Plan:    I have personally reviewed and noted the following in the patient's chart:   . Medical and social history . Use of alcohol, tobacco or illicit drugs  . Current medications and supplements . Functional ability and status . Nutritional status . Physical activity . Advanced directives . List of other physicians . Hospitalizations, surgeries, and ER visits in previous 12 months . Vitals . Screenings to include cognitive, depression, and falls . Referrals and appointments  In addition, I have reviewed and discussed with patient certain preventive protocols, quality metrics, and best practice recommendations. A written personalized care plan for preventive services as well as general preventive health recommendations were provided to patient.     Tod Persia, [place]  03/11/2018

## 2018-03-12 ENCOUNTER — Telehealth: Payer: Self-pay | Admitting: Family Medicine

## 2018-03-12 NOTE — Telephone Encounter (Signed)
Let her know its scheduled for a 20 minute appt but I put in her notes that she would like a pap at that visit.

## 2018-03-12 NOTE — Telephone Encounter (Signed)
[phone]/ [phone]  Patient wants to know if she can get a pap swear at her next appt.

## 2018-03-12 NOTE — Telephone Encounter (Signed)
Changed appt to accommodate CPE\PAP.

## 2018-03-24 ENCOUNTER — Encounter: Payer: Self-pay | Admitting: Sports Medicine

## 2018-03-24 ENCOUNTER — Ambulatory Visit (INDEPENDENT_AMBULATORY_CARE_PROVIDER_SITE_OTHER): Payer: PPO | Admitting: Sports Medicine

## 2018-03-24 DIAGNOSIS — M79671 Pain in right foot: Secondary | ICD-10-CM

## 2018-03-24 DIAGNOSIS — B351 Tinea unguium: Secondary | ICD-10-CM | POA: Diagnosis not present

## 2018-03-24 DIAGNOSIS — M79672 Pain in left foot: Secondary | ICD-10-CM

## 2018-03-24 DIAGNOSIS — E1142 Type 2 diabetes mellitus with diabetic polyneuropathy: Secondary | ICD-10-CM

## 2018-03-24 NOTE — Progress Notes (Signed)
Patient ID: Ashley Armstrong, female   DOB: 03/17/1966, 52 y.o.   MRN: [id number] Subjective: Ashley Armstrong is a 52 y.o. female patient with history of diabetes who presents to office today complaining of long, painful nails  while ambulating in shoes; unable to trim. Patient states that the glucose reading this morning was not recorded, a1c 8.9. Patient admits that she had eye surgery in march. Otherwise is doing good.   Patient Active Problem List   Diagnosis Date Noted  . Gallbladder polyp 12/13/2016  . GERD (gastroesophageal reflux disease) 09/05/2016  . Constipation 10/12/2015  . Seasonal allergies 11/09/2014  . Depression with anxiety 06/18/2014  . Tight fascia 01/17/2014  . Degenerative disc disease, cervical 12/07/2013  . CTS (carpal tunnel syndrome) 07/08/2013  . Polyneuropathy 07/08/2013  . Diabetic nephropathy ([place]) 06/23/2013  . Tennis elbow syndrome 04/15/2013  . Rotator cuff syndrome of left shoulder 04/15/2013  . Vitamin D deficiency 03/20/2013  . Onychomycosis of toenail 03/20/2013  . DIABETES MELLITUS, WITH RENAL COMPLICATIONS 46/50/3546  . Gastroparesis 06/16/2008  . Hyperlipidemia LDL goal <100 04/13/2008  . Generalized anxiety disorder 04/13/2008   Current Outpatient Medications on File Prior to Visit  Medication Sig Dispense Refill  . azelastine (ASTELIN) 0.1 % nasal spray Place 2 sprays into both nostrils 2 (two) times daily. Use in each nostril as directed (Patient taking differently: Place 2 sprays into both nostrils 2 (two) times daily as needed for allergies. Use in each nostril as directed) 30 mL 12  . Blood Glucose Monitoring Suppl (ONE TOUCH ULTRA MINI) w/Device KIT Use as directed 1 each 0  . busPIRone (BUSPAR) 10 MG tablet Take 5 mg by mouth 2 (two) times daily.    . calcium-vitamin D (OSCAL WITH D) 500-200 MG-UNIT TABS tablet Take 1 tablet by mouth daily.    . diazepam (VALIUM) 10 MG tablet Take 10 mg by mouth daily as needed for anxiety.     . DULoxetine  (CYMBALTA) 60 MG capsule TAKE ONE (1) CAPSULE EACH DAY (Patient not taking: Reported on 03/11/2018) 90 capsule 1  . DUREZOL 0.05 % EMUL Apply 1 drop to eye daily at 8 pm.    . glucose blood (ONE TOUCH ULTRA TEST) test strip Use as instructed 4 times daily dx e11.65 350 each 1  . insulin aspart (NOVOLOG FLEXPEN) 100 UNIT/ML FlexPen INJECT 10 TO 20 UNITS THREE TIMES A DAY WITH MEALS 15 mL 5  . Insulin Pen Needle (SURE COMFORT PEN NEEDLES) 32G X 4 MM MISC Use daily with insulin 4 times a day 400 each 1  . LANTUS SOLOSTAR 100 UNIT/ML Solostar Pen INJECT 90 UNITS INTO THE SKIN DAILY 15 mL 11  . linaclotide (LINZESS) 72 MCG capsule 1 PO 30 mins WITH your first meal 30 capsule 5  . lisinopril (PRINIVIL,ZESTRIL) 2.5 MG tablet TAKE ONE (1) TABLET BY MOUTH EVERY DAY 90 tablet 0  . metoCLOPramide (REGLAN) 5 MG tablet TAKE ONE TO TWO TABLETS EVERY SIX HOURS AS NEEDED FOR NAUSEA OR VOMITING 60 tablet 3  . mirtazapine (REMERON) 30 MG tablet Take 1 tablet by mouth daily at 8 pm.    . mometasone (NASONEX) 50 MCG/ACT nasal spray Place 2 sprays into the nose daily. (Patient taking differently: Place 2 sprays into the nose daily as needed (Allergies). ) 17 g 12  . montelukast (SINGULAIR) 10 MG tablet TAKE ONE TABLET BY MOUTH EVERY NIGHT AT BEDTIME 90 tablet 1  . Omega-3 Fatty Acids (FISH OIL) 1000 MG CAPS Take  1 capsule by mouth daily.    Marland Kitchen omeprazole (PRILOSEC) 40 MG capsule TAKE ONE CAPSULE BY MOUTH 30 MINUTES PRIOR TO BREAKFAST AND SUPPER. 180 capsule 3  . ondansetron (ZOFRAN) 4 MG tablet TAKE ONE TABLET BY MOUTH EVERY EIGHT HOURS AS NEEDED FOR NAUSEA OR VOMITING 60 tablet 3  . ONETOUCH DELICA LANCETS 09W MISC USE FOUR TIMES A DAY AS DIRECTED. 400 each 1  . ONETOUCH DELICA LANCETS 11B MISC Test four times daily dx e11.65 350 each 1  . pravastatin (PRAVACHOL) 80 MG tablet TAKE ONE TABLET BY MOUTH IN THE EVENING. 90 tablet 1  . vitamin B-12 (CYANOCOBALAMIN) 100 MCG tablet Take 1 tablet by mouth daily.     No current  facility-administered medications on file prior to visit.    Not on File  Recent Results (from the past 2160 hour(s))  [facility] DIABETES EYE EXAM     Status: Abnormal   Collection Time: 01/02/18 10:02 AM  Result Value Ref Range   [facility] Diabetic Eye Exam Retinopathy (A) No Retinopathy  Lipid panel     Status: Abnormal   Collection Time: 02/12/18  7:41 AM  Result Value Ref Range   Cholesterol 197 <200 mg/dL   HDL 72 >50 mg/dL   Triglycerides 92 <150 mg/dL   LDL Cholesterol (Calc) 106 (H) mg/dL (calc)    Comment: Reference range: <100 . Desirable range <100 mg/dL for primary prevention;   <70 mg/dL for patients with CHD or diabetic patients  with > or = 2 CHD risk factors. Marland Kitchen LDL-C is now calculated using the Martin-Hopkins  calculation, which is a validated novel method providing  better accuracy than the Friedewald equation in the  estimation of LDL-C.  Cresenciano Genre et al. Annamaria Helling. 1478;295(62): 2061-2068  (http://education.[facility].com/faq/FAQ164)    Total CHOL/HDL Ratio 2.7 <5.0 (calc)   Non-HDL Cholesterol (Calc) 125 <130 mg/dL (calc)    Comment: For patients with diabetes plus 1 major ASCVD risk  factor, treating to a non-HDL-C goal of <100 mg/dL  (LDL-C of <70 mg/dL) is considered a therapeutic  option.   COMPLETE METABOLIC PANEL WITH GFR     Status: Abnormal   Collection Time: 02/12/18  7:41 AM  Result Value Ref Range   Glucose, Bld 170 (H) 65 - 99 mg/dL    Comment: .            Fasting reference interval . For someone without known diabetes, a glucose value >125 mg/dL indicates that they may have diabetes and this should be confirmed with a follow-up test. .    BUN 17 7 - 25 mg/dL   Creat 1.14 (H) 0.50 - 1.05 mg/dL    Comment: For patients >50 years of age, the reference limit for Creatinine is approximately 13% higher for people identified as African-American. .    GFR, Est Non African American 56 (L) > OR = 60 mL/min/1.65m   GFR, Est African American 65 > OR  = 60 mL/min/1.745m  BUN/Creatinine Ratio 15 6 - 22 (calc)   Sodium 142 135 - 146 mmol/L   Potassium 4.3 3.5 - 5.3 mmol/L   Chloride 105 98 - 110 mmol/L   CO2 32 20 - 32 mmol/L   Calcium 9.2 8.6 - 10.4 mg/dL   Total Protein 6.4 6.1 - 8.1 g/dL   Albumin 3.8 3.6 - 5.1 g/dL   Globulin 2.6 1.9 - 3.7 g/dL (calc)   AG Ratio 1.5 1.0 - 2.5 (calc)   Total Bilirubin 0.4 0.2 - 1.2  mg/dL   Alkaline phosphatase (APISO) 99 33 - 130 U/L   AST 17 10 - 35 U/L   ALT 14 6 - 29 U/L  Hemoglobin A1c     Status: Abnormal   Collection Time: 02/12/18  7:41 AM  Result Value Ref Range   Hgb A1c MFr Bld 8.9 (H) <5.7 % of total Hgb    Comment: For someone without known diabetes, a hemoglobin A1c value of 6.5% or greater indicates that they may have  diabetes and this should be confirmed with a follow-up  test. . For someone with known diabetes, a value <7% indicates  that their diabetes is well controlled and a value  greater than or equal to 7% indicates suboptimal  control. A1c targets should be individualized based on  duration of diabetes, age, comorbid conditions, and  other considerations. . Currently, no consensus exists regarding use of hemoglobin A1c for diagnosis of diabetes for children. .    Mean Plasma Glucose 209 (calc)   eAG (mmol/L) 11.6 (calc)    Objective: General: Patient is awake, alert, and oriented x 3 and in no acute distress.  Integument: Skin is warm, dry and supple bilateral. Nails are tender, long, thickened and  dystrophic with subungual debris, consistent with onychomycosis, 1-5 bilateral. No signs of infection. No open lesions or preulcerative lesions present bilateral. Remaining integument unremarkable.  Vasculature:  Dorsalis Pedis pulse 1/4 bilateral. Posterior Tibial pulse 1/4 bilateral.  Capillary fill time <3 sec 1-5 bilateral. Decreased hair growth to the level of the digits. Temperature gradient within normal limits. Mild varicosities present bilateral. No  edema present bilateral.   Neurology: The patient has absent sensation measured with a 5.07/10g Semmes Weinstein Monofilament at all pedal sites bilateral . Vibratory sensation absent bilateral with tuning fork. No Babinski sign present bilateral.   Musculoskeletal: Asymptomatic varus 4-5 hammertoes pedal deformities noted bilateral. Muscular strength 4/5 in all lower extremity muscular groups bilateral without pain on range of motion . No tenderness with calf compression bilateral.  Assessment and Plan: Problem List Items Addressed This Visit    None    Visit Diagnoses    Dermatophytosis of nail    -  Primary   Diabetic polyneuropathy associated with type 2 diabetes mellitus (HCC)       Foot pain, bilateral          -Examined patient. -Discussed and educated patient on diabetic foot care, especially with  regards to the vascular, neurological and musculoskeletal systems.  -Stressed the importance of good glycemic control and the detriment of not  controlling glucose levels in relation to the foot. -Mechanically debrided all nails 1-5 bilateral using sterile nail nipper and filed with dremel without incident  -Answered all patient questions -Patient to return  in 3 months for at risk foot care -Patient advised to call the office if any problems or questions arise in the meantime.  Landis Martins, DPM

## 2018-04-01 ENCOUNTER — Encounter: Payer: Self-pay | Admitting: "Endocrinology

## 2018-04-01 ENCOUNTER — Ambulatory Visit (INDEPENDENT_AMBULATORY_CARE_PROVIDER_SITE_OTHER): Payer: PPO | Admitting: "Endocrinology

## 2018-04-01 VITALS — BP 124/80 | HR 74 | Ht 68.0 in | Wt 135.0 lb

## 2018-04-01 DIAGNOSIS — E782 Mixed hyperlipidemia: Secondary | ICD-10-CM | POA: Diagnosis not present

## 2018-04-01 DIAGNOSIS — I10 Essential (primary) hypertension: Secondary | ICD-10-CM | POA: Diagnosis not present

## 2018-04-01 DIAGNOSIS — N182 Chronic kidney disease, stage 2 (mild): Secondary | ICD-10-CM | POA: Diagnosis not present

## 2018-04-01 DIAGNOSIS — Z9119 Patient's noncompliance with other medical treatment and regimen: Secondary | ICD-10-CM | POA: Diagnosis not present

## 2018-04-01 DIAGNOSIS — E1022 Type 1 diabetes mellitus with diabetic chronic kidney disease: Secondary | ICD-10-CM | POA: Diagnosis not present

## 2018-04-01 DIAGNOSIS — Z91199 Patient's noncompliance with other medical treatment and regimen due to unspecified reason: Secondary | ICD-10-CM | POA: Insufficient documentation

## 2018-04-01 MED ORDER — GLUCOSE BLOOD VI STRP: ORAL_STRIP | 1 refills | Status: DC

## 2018-04-01 MED ORDER — INSULIN PEN NEEDLE 32G X 4 MM MISC: 1 refills | Status: DC

## 2018-04-01 MED ORDER — ONETOUCH DELICA LANCETS 33G MISC
1 refills | Status: DC
Start: 2018-04-01 — End: 2018-06-24

## 2018-04-01 MED ORDER — INSULIN GLARGINE 100 UNIT/ML SOLOSTAR PEN: 40.0000 [IU] | PEN_INJECTOR | Freq: Every day | SUBCUTANEOUS | 2 refills | Status: DC

## 2018-04-01 MED ORDER — INSULIN ASPART 100 UNIT/ML FLEXPEN: PEN_INJECTOR | SUBCUTANEOUS | 2 refills | Status: DC

## 2018-04-01 NOTE — Progress Notes (Signed)
Endocrinology Consult Note       04/01/2018, 4:07 PM   Subjective:    Patient ID: Ashley Armstrong, female    DOB: January 08, 1966.  Ashley Armstrong is being seen in consultation for management of currently uncontrolled symptomatic diabetes requested by  Fayrene Helper, MD.   Past Medical History:  Diagnosis Date  . Anxiety   . Dyslipidemia   . Gastroparesis due to DM ([place]) OCT 2014   75% AT 2 HRS, GLU >   . Headache(784.0)   . IDDM (insulin dependent diabetes mellitus) ([place])   . Neuropathy   . Nicotine addiction   . TIA (transient ischemic attack)    Dr. Merlene Laughter   Past Surgical History:  Procedure Laterality Date  . Cataract surgery Bilateral 01/20/2017, 02/03/2017  . COLONOSCOPY N/A 05/09/2016   Procedure: COLONOSCOPY;  Surgeon: Danie Binder, MD;  Location: [facility];  Service: Endoscopy;  Laterality: N/A;  130  . ESOPHAGOGASTRODUODENOSCOPY N/A 08/31/2013   SLF: 1. Earky satiety nausea may be due to Gastroparesis/pyloric channel stenosis. 2. small hiatal hernia 3. Moderate erosive gastritis.   Marland Kitchen laser surgery bilateral eye     Social History   Socioeconomic History  . Marital status: Single    Spouse name: Not on file  . Number of children: Not on file  . Years of education: Not on file  . Highest education level: Not on file  Occupational History  . Occupation: disabled  Social Needs  . Financial resource strain: Not very hard  . Food insecurity:    Worry: Sometimes true    Inability: Sometimes true  . Transportation needs:    Medical: No    Non-medical: No  Tobacco Use  . Smoking status: Former Smoker    Packs/day: 1.50    Years: 24.00    Pack years: 36.00    Types: Cigarettes    Last attempt to quit: 08/24/2011    Years since quitting: 6.6  . Smokeless tobacco: Never Used  Substance and Sexual Activity  . Alcohol use: No  . Drug use: No  . Sexual activity: Not Currently     Birth control/protection: None  Lifestyle  . Physical activity:    Days per week: 2 days    Minutes per session: 20 min  . Stress: Very much  Relationships  . Social connections:    Talks on phone: More than three times a week    Gets together: More than three times a week    Attends religious service: More than 4 times per year    Active member of club or organization: No    Attends meetings of clubs or organizations: Never    Relationship status: Never married  Other Topics Concern  . Not on file  Social History Narrative   Disabled, lives with a room mate   Outpatient Encounter Medications as of 04/01/2018  Medication Sig  . azelastine (ASTELIN) 0.1 % nasal spray Place 2 sprays into both nostrils 2 (two) times daily. Use in each nostril as directed (Patient taking differently: Place 2 sprays into both nostrils 2 (two) times daily as needed for  allergies. Use in each nostril as directed)  . Blood Glucose Monitoring Suppl (ONE TOUCH ULTRA MINI) w/Device KIT Use as directed  . busPIRone (BUSPAR) 10 MG tablet Take 5 mg by mouth 2 (two) times daily.  . calcium-vitamin D (OSCAL WITH D) 500-200 MG-UNIT TABS tablet Take 1 tablet by mouth daily.  . diazepam (VALIUM) 10 MG tablet Take 10 mg by mouth daily as needed for anxiety.   . DUREZOL 0.05 % EMUL Apply 1 drop to eye daily at 8 pm.  . glucose blood (ONE TOUCH ULTRA TEST) test strip Use as instructed 4 times daily dx e11.65  . insulin aspart (NOVOLOG FLEXPEN) 100 UNIT/ML FlexPen INJECT 10 TO 20 UNITS THREE TIMES A DAY WITH MEALS  . Insulin Glargine (LANTUS SOLOSTAR) 100 UNIT/ML Solostar Pen Inject 40 Units into the skin daily at 10 pm.  . Insulin Pen Needle (SURE COMFORT PEN NEEDLES) 32G X 4 MM MISC Use daily with insulin 4 times a day  . linaclotide (LINZESS) 72 MCG capsule 1 PO 30 mins WITH your first meal  . lisinopril (PRINIVIL,ZESTRIL) 2.5 MG tablet TAKE ONE (1) TABLET BY MOUTH EVERY DAY  . metoCLOPramide (REGLAN) 5 MG tablet  TAKE ONE TO TWO TABLETS EVERY SIX HOURS AS NEEDED FOR NAUSEA OR VOMITING  . mirtazapine (REMERON) 30 MG tablet Take 1 tablet by mouth daily at 8 pm.  . mometasone (NASONEX) 50 MCG/ACT nasal spray Place 2 sprays into the nose daily. (Patient taking differently: Place 2 sprays into the nose daily as needed (Allergies). )  . montelukast (SINGULAIR) 10 MG tablet TAKE ONE TABLET BY MOUTH EVERY NIGHT AT BEDTIME  . Omega-3 Fatty Acids (FISH OIL) 1000 MG CAPS Take 1 capsule by mouth daily.  Marland Kitchen omeprazole (PRILOSEC) 40 MG capsule TAKE ONE CAPSULE BY MOUTH 30 MINUTES PRIOR TO BREAKFAST AND SUPPER.  Marland Kitchen ondansetron (ZOFRAN) 4 MG tablet TAKE ONE TABLET BY MOUTH EVERY EIGHT HOURS AS NEEDED FOR NAUSEA OR VOMITING  . ONETOUCH DELICA LANCETS 17P MISC USE FOUR TIMES A DAY AS DIRECTED.  Marland Kitchen pravastatin (PRAVACHOL) 80 MG tablet TAKE ONE TABLET BY MOUTH IN THE EVENING.  . vitamin B-12 (CYANOCOBALAMIN) 100 MCG tablet Take 1 tablet by mouth daily.  . [DISCONTINUED] DULoxetine (CYMBALTA) 60 MG capsule TAKE ONE (1) CAPSULE EACH DAY (Patient not taking: Reported on 03/11/2018)  . [DISCONTINUED] glucose blood (ONE TOUCH ULTRA TEST) test strip Use as instructed 4 times daily dx e11.65  . [DISCONTINUED] insulin aspart (NOVOLOG FLEXPEN) 100 UNIT/ML FlexPen INJECT 10 TO 20 UNITS THREE TIMES A DAY WITH MEALS  . [DISCONTINUED] Insulin Pen Needle (SURE COMFORT PEN NEEDLES) 32G X 4 MM MISC Use daily with insulin 4 times a day  . [DISCONTINUED] LANTUS SOLOSTAR 100 UNIT/ML Solostar Pen INJECT 90 UNITS INTO THE SKIN DAILY  . [DISCONTINUED] ONETOUCH DELICA LANCETS 10C MISC USE FOUR TIMES A DAY AS DIRECTED.  . [DISCONTINUED] ONETOUCH DELICA LANCETS 58N MISC Test four times daily dx e11.65   No facility-administered encounter medications on file as of 04/01/2018.     ALLERGIES: No Known Allergies  VACCINATION STATUS: Immunization History  Administered Date(s) Administered  . H1N1 10/24/2008  . Influenza Whole 09/24/2007, 08/09/2010  .  Influenza,inj,Quad PF,6+ Mos 07/29/2013, 10/18/2014, 08/29/2015, 09/23/2016, 07/16/2017  . Pneumococcal Polysaccharide-23 03/19/2013  . Td 04/11/2004  . Tdap 06/16/2014    Diabetes  She presents for her initial diabetic visit. She has type 1 diabetes mellitus. Onset time: She was diagnosed at approximate age of 89 years. Her  disease course has been worsening. There are no hypoglycemic associated symptoms. Pertinent negatives for hypoglycemia include no confusion, headaches, pallor or seizures. Associated symptoms include blurred vision, fatigue, polydipsia and polyuria. Pertinent negatives for diabetes include no chest pain and no polyphagia. There are no hypoglycemic complications. Symptoms are worsening. Diabetic complications include nephropathy and retinopathy. Risk factors for coronary artery disease include dyslipidemia, diabetes mellitus, hypertension, sedentary lifestyle and tobacco exposure. Current diabetic treatment includes insulin injections. Compliance with diabetes treatment: This patient was seen in this clinic in 2016 at which time her A1c was 7.9% on Lantus 25 units nightly and NovoLog 7 units 3 times daily AC.  Returns with A1c of 8.9% claims to be on Lantus 90 units nightly and NovoLog 10 to 20 units 3 times daily AC. Her weight is increasing steadily. She is following a generally unhealthy diet. When asked about meal planning, she reported none. She has had a previous visit with a dietitian. She rarely participates in exercise. (She did not bring any meter nor logs to review.  Her recent A1c was 8.9% on February 12, 2018.) An ACE inhibitor/angiotensin II receptor blocker is being taken. Eye exam is current (She has advanced retinopathy following with [facility] ophthalmology.).  Hyperlipidemia  This is a chronic problem. The current episode started more than 1 year ago. The problem is uncontrolled. Recent lipid tests were reviewed and are variable. Exacerbating diseases include diabetes.  Pertinent negatives include no chest pain, myalgias or shortness of breath. Current antihyperlipidemic treatment includes statins. Risk factors for coronary artery disease include dyslipidemia, diabetes mellitus, hypertension and a sedentary lifestyle.  Hypertension  This is a chronic problem. The current episode started more than 1 year ago. The problem is controlled. Associated symptoms include blurred vision. Pertinent negatives include no chest pain, headaches, palpitations or shortness of breath. Risk factors for coronary artery disease include diabetes mellitus, dyslipidemia, sedentary lifestyle and smoking/tobacco exposure. Past treatments include angiotensin blockers. Hypertensive end-organ damage includes retinopathy.      Review of Systems  Constitutional: Positive for fatigue. Negative for chills, fever and unexpected weight change.  HENT: Negative for trouble swallowing and voice change.   Eyes: Positive for blurred vision. Negative for visual disturbance.  Respiratory: Negative for cough, shortness of breath and wheezing.   Cardiovascular: Negative for chest pain, palpitations and leg swelling.  Gastrointestinal: Negative for diarrhea, nausea and vomiting.  Endocrine: Positive for polydipsia and polyuria. Negative for cold intolerance, heat intolerance and polyphagia.  Musculoskeletal: Negative for arthralgias and myalgias.  Skin: Negative for color change, pallor, rash and wound.  Neurological: Negative for seizures and headaches.  Psychiatric/Behavioral: Negative for confusion and suicidal ideas.    Objective:    BP 124/80   Pulse 74   Ht 5' 8" (1.727 m)   Wt 135 lb (61.2 kg)   LMP 07/26/2013   BMI 20.53 kg/m   Wt Readings from Last 3 Encounters:  04/01/18 135 lb (61.2 kg)  03/11/18 136 lb 1.9 oz (61.7 kg)  11/27/17 134 lb (60.8 kg)     Physical Exam  Constitutional: She is oriented to person, place, and time. She appears well-developed.  HENT:  Head:  Normocephalic and atraumatic.  Eyes: EOM are normal.  Neck: Normal range of motion. Neck supple. No tracheal deviation present. No thyromegaly present.  Cardiovascular: Normal rate and regular rhythm.  Pulmonary/Chest: Effort normal and breath sounds normal.  Abdominal: Soft. Bowel sounds are normal. There is no tenderness. There is no guarding.  Musculoskeletal: Normal range  of motion. She exhibits no edema.  Neurological: She is alert and oriented to person, place, and time. She has normal reflexes. No cranial nerve deficit. Coordination normal.  Skin: Skin is warm and dry. No rash noted. No erythema. No pallor.  Psychiatric: She has a normal mood and affect. Judgment normal.  Reluctant affect.      CMP ( most recent) CMP     Component Value Date/Time   NA 142 02/12/2018 0741   K 4.3 02/12/2018 0741   CL 105 02/12/2018 0741   CO2 32 02/12/2018 0741   GLUCOSE 170 (H) 02/12/2018 0741   BUN 17 02/12/2018 0741   CREATININE 1.14 (H) 02/12/2018 0741   CALCIUM 9.2 02/12/2018 0741   PROT 6.4 02/12/2018 0741   ALBUMIN 3.8 07/11/2017 1518   AST 17 02/12/2018 0741   ALT 14 02/12/2018 0741   ALKPHOS 105 07/11/2017 1518   BILITOT 0.4 02/12/2018 0741   GFRNONAA 56 (L) 02/12/2018 0741   GFRAA 65 02/12/2018 0741     Diabetic Labs (most recent): Lab Results  Component Value Date   HGBA1C 8.9 (H) 02/12/2018   HGBA1C 8.3 (H) 10/18/2017   HGBA1C 8.7 (H) 07/17/2017     Lipid Panel ( most recent) Lipid Panel     Component Value Date/Time   CHOL 197 02/12/2018 0741   TRIG 92 02/12/2018 0741   HDL 72 02/12/2018 0741   CHOLHDL 2.7 02/12/2018 0741   VLDL 17 07/11/2017 1518   LDLCALC 106 (H) 02/12/2018 0741      Lab Results  Component Value Date   TSH 2.08 09/23/2016   TSH 3.289 08/07/2015   TSH 1.853 03/19/2013   TSH 1.388 12/10/2010   TSH 2.758 06/20/2009   TSH 1.164 10/06/2007     Assessment & Plan:   1. Type 1 diabetes mellitus with stage 2 chronic kidney disease,  with long-term current use of insulin (HCC)  - PHARRAH ROTTMAN has currently uncontrolled symptomatic type 1 DM since 52 years of age. -This patient was seen and ~2016 since when she has disappeared from care.  She returns without any meter nor logs, A1c 8.9%.  Recent labs reviewed.  -her diabetes is complicated by advanced retinopathy, noncompliance/nonadherence, renal insufficiency, history of chronic heavy smoking, and LUTICIA TADROS remains at a high risk for more acute and chronic complications which include CAD, CVA, CKD, retinopathy, and neuropathy. These are all discussed in detail with the patient.  - I have counseled her on diet management  by adopting a carbohydrate restricted/protein rich diet.  - Suggestion is made for her to avoid simple carbohydrates  from her diet including Cakes, Sweet Desserts, Ice Cream, Soda (diet and regular), Sweet Tea, Candies, Chips, Cookies, Store Bought Juices, Alcohol in Excess of  1-2 drinks a day, Artificial Sweeteners, and "Sugar-free" Products. This will help patient to have stable blood glucose profile and potentially avoid unintended weight gain.  - I encouraged her to switch to  unprocessed or minimally processed complex starch and increased protein intake (animal or plant source), fruits, and vegetables.  - she is advised to stick to a routine mealtimes to eat 3 meals  a day and avoid unnecessary snacks ( to snack only to correct hypoglycemia).   - she will be scheduled with Jearld Fenton, RDN, CDE for individualized diabetes education.  - I have approached her with the following individualized plan to manage diabetes and patient agrees:   -She promises to do better and engage for self-care.  -  Given her chronically uncontrolled type 1 diabetes, her exclusive choice of treatment is still intensive treatment with basal/bolus insulin.  -Without proper monitoring 90 units of Lantus for basal insulin would be excessive.  -I have advised her to lower  it to 40 units nightly, lower NovoLog to 10 units 3 times a day with meals  for pre-meal BG readings of 90-149m/dl, plus patient specific correction dose for unexpected hyperglycemia above 1562mdl, associated with strict monitoring of glucose 4 times a day-before meals and at bedtime. -She is asked to return in weeks with her meter and logs for evaluation and treatment adjustment if necessary. - Patient is warned not to take insulin without proper monitoring per orders. -Adjustment parameters are given for hypo and hyperglycemia in writing. -Patient is encouraged to call clinic for blood glucose levels less than 70 or above 300 mg /dl. -She is not a candidate for incretin therapy, SGLT2 inhibitors nor metformin therapy. - Patient specific target  A1c;  LDL, HDL, Triglycerides, and  Waist Circumference were discussed in detail.  2) BP/HTN: Her blood pressure is controlled to target.. Continue current medications including lisinopril 2.5 mg p.o. Daily.  3) Lipids/HPL:   Her recent lipid panel shows higher LDL of 106.   Patient is advised to continue pravastatin 80 mg p.o. nightly.  4)  Weight/Diet: She says she has a [place]  exercise, and detailed carbohydrates information provided.  5) Chronic Care/Health Maintenance:  -she  is on ACEI/ARB and Statin medications and  is encouraged to continue to follow up with Ophthalmology, Dentist,  Podiatrist at least yearly or according to recommendations, and advised to  stay away from smoking. I have recommended yearly flu vaccine and pneumonia vaccination at least every 5 years; moderate intensity exercise for up to 150 minutes weekly; and  sleep for at least 7 hours a day.  - I advised patient to maintain close follow up with SiFayrene HelperMD for primary care needs.  - Time spent with the patient: 45 minutes, of which >50% was spent in obtaining information about her symptoms, reviewing her previous labs, evaluations, and  treatments, counseling her about her currently complicated and uncontrolled type 1 diabetes, hyperlipidemia, hypertension, and developing a plan to confirm the diagnosis and long term treatment as necessary.  JoMyer Haffarticipated in the discussions, expressed understanding, and voiced agreement with the above plans.  All questions were answered to her satisfaction. she is encouraged to contact clinic should she have any questions or concerns prior to her return visit.  Follow up plan: - Return in about 2 weeks (around 04/15/2018) for follow up with meter and logs- no labs.  GeGlade LloydMD CoRiverview Ambulatory Surgical Center LLCroup ReWillamette Surgery Center LLC[address] CourteTerral[place] [id number]: [phone]: [phone]  04/01/2018, 4:07 PM  This note was partially dictated with voice recognition software. Similar sounding words can be transcribed inadequately or may not  be corrected upon review.

## 2018-04-21 ENCOUNTER — Ambulatory Visit: Payer: PPO | Admitting: "Endocrinology

## 2018-04-21 ENCOUNTER — Encounter: Payer: Self-pay | Admitting: "Endocrinology

## 2018-05-01 ENCOUNTER — Other Ambulatory Visit: Payer: Self-pay | Admitting: Family Medicine

## 2018-05-04 ENCOUNTER — Ambulatory Visit: Payer: PPO | Admitting: Family Medicine

## 2018-05-04 ENCOUNTER — Encounter: Payer: PPO | Admitting: Family Medicine

## 2018-05-05 ENCOUNTER — Telehealth: Payer: Self-pay | Admitting: Family Medicine

## 2018-05-05 NOTE — Telephone Encounter (Signed)
Patient does not want to see Dr.Nida anymore. She is requesting that Dr.Simpson treat her diabetes. Cb#: [id number] / [id number] She feels more motivated by Dr.Simpson.

## 2018-05-06 NOTE — Telephone Encounter (Signed)
Please explain she has not even stayed with Dr Dorris Fetch for 3 months to be motivated and after over 18 months of trying to get motivated with me she realized that her blood sugar was not getting where it needed to be so she needs to stay with endo

## 2018-05-06 NOTE — Telephone Encounter (Signed)
Needs tpo stay with endo

## 2018-05-07 DIAGNOSIS — F329 Major depressive disorder, single episode, unspecified: Secondary | ICD-10-CM | POA: Diagnosis not present

## 2018-05-18 ENCOUNTER — Emergency Department (HOSPITAL_COMMUNITY): Payer: PPO

## 2018-05-18 ENCOUNTER — Other Ambulatory Visit: Payer: Self-pay

## 2018-05-18 ENCOUNTER — Emergency Department (HOSPITAL_COMMUNITY)
Admission: EM | Admit: 2018-05-18 | Discharge: 2018-05-18 | Disposition: A | Discharge status: Home / Self Care | Payer: PPO | Attending: Emergency Medicine | Admitting: Emergency Medicine

## 2018-05-18 ENCOUNTER — Encounter (HOSPITAL_COMMUNITY): Payer: Self-pay

## 2018-05-18 DIAGNOSIS — R109 Unspecified abdominal pain: Secondary | ICD-10-CM | POA: Diagnosis not present

## 2018-05-18 DIAGNOSIS — Z79899 Other long term (current) drug therapy: Secondary | ICD-10-CM | POA: Diagnosis not present

## 2018-05-18 DIAGNOSIS — Z794 Long term (current) use of insulin: Secondary | ICD-10-CM | POA: Insufficient documentation

## 2018-05-18 DIAGNOSIS — Z8673 Personal history of transient ischemic attack (TIA), and cerebral infarction without residual deficits: Secondary | ICD-10-CM | POA: Insufficient documentation

## 2018-05-18 DIAGNOSIS — Z87891 Personal history of nicotine dependence: Secondary | ICD-10-CM | POA: Insufficient documentation

## 2018-05-18 DIAGNOSIS — R739 Hyperglycemia, unspecified: Secondary | ICD-10-CM | POA: Diagnosis not present

## 2018-05-18 DIAGNOSIS — Z7902 Long term (current) use of antithrombotics/antiplatelets: Secondary | ICD-10-CM | POA: Insufficient documentation

## 2018-05-18 DIAGNOSIS — E1165 Type 2 diabetes mellitus with hyperglycemia: Secondary | ICD-10-CM | POA: Diagnosis not present

## 2018-05-18 DIAGNOSIS — E1040 Type 1 diabetes mellitus with diabetic neuropathy, unspecified: Secondary | ICD-10-CM | POA: Diagnosis not present

## 2018-05-18 LAB — CBC WITH DIFFERENTIAL/PLATELET
Basophils Absolute: 0.1 10*3/uL (ref 0.0–0.1)
Basophils Relative: 1 %
EOS PCT: 2 %
Eosinophils Absolute: 0.2 10*3/uL (ref 0.0–0.7)
HEMATOCRIT: 38 % (ref 36.0–46.0)
Hemoglobin: 12.4 g/dL (ref 12.0–15.0)
LYMPHS PCT: 24 %
Lymphs Abs: 1.8 10*3/uL (ref 0.7–4.0)
MCH: 28.8 pg (ref 26.0–34.0)
MCHC: 32.6 g/dL (ref 30.0–36.0)
MCV: 88.2 fL (ref 78.0–100.0)
MONO ABS: 0.5 10*3/uL (ref 0.1–1.0)
Monocytes Relative: 6 %
Neutro Abs: 5.1 10*3/uL (ref 1.7–7.7)
Neutrophils Relative %: 67 %
PLATELETS: 253 10*3/uL (ref 150–400)
RBC: 4.31 MIL/uL (ref 3.87–5.11)
RDW: 12.8 % (ref 11.5–15.5)
WBC: 7.6 10*3/uL (ref 4.0–10.5)

## 2018-05-18 LAB — COMPREHENSIVE METABOLIC PANEL
ALT: 12 U/L (ref 0–44)
AST: 15 U/L (ref 15–41)
Albumin: 3.8 g/dL (ref 3.5–5.0)
Alkaline Phosphatase: 88 U/L (ref 38–126)
Anion gap: 7 (ref 5–15)
BILIRUBIN TOTAL: 0.5 mg/dL (ref 0.3–1.2)
BUN: 20 mg/dL (ref 6–20)
CHLORIDE: 103 mmol/L (ref 98–111)
CO2: 27 mmol/L (ref 22–32)
CREATININE: 1.01 mg/dL — AB (ref 0.44–1.00)
Calcium: 9.2 mg/dL (ref 8.9–10.3)
Glucose, Bld: 300 mg/dL — ABNORMAL HIGH (ref 70–99)
POTASSIUM: 5 mmol/L (ref 3.5–5.1)
Sodium: 137 mmol/L (ref 135–145)
TOTAL PROTEIN: 7.1 g/dL (ref 6.5–8.1)

## 2018-05-18 LAB — URINALYSIS, ROUTINE W REFLEX MICROSCOPIC
Bilirubin Urine: NEGATIVE
Glucose, UA: 150 mg/dL — AB
Hgb urine dipstick: NEGATIVE
Ketones, ur: NEGATIVE mg/dL
LEUKOCYTES UA: NEGATIVE
NITRITE: NEGATIVE
Protein, ur: NEGATIVE mg/dL
SPECIFIC GRAVITY, URINE: 1.015 (ref 1.005–1.030)
pH: 5 (ref 5.0–8.0)

## 2018-05-18 NOTE — ED Provider Notes (Signed)
[facility] EMERGENCY DEPARTMENT Provider Note   CSN: [id number] Arrival date & time: 05/18/18  1316     History   Chief Complaint Chief Complaint  Patient presents with  . Flank Pain    HPI Ashley Armstrong is a 52 y.o. female.  Left flank pain with radiation to the left inferior lateral abdominal wall for approximately 10 days.  No dysuria, hematuria, fever, sweats, chills.  Patient did report moving a heavy refrigerator which may have contributed to her pain.  She is a type I diabetic and has gastroparesis.  Severity of pain is moderate.  She has taken nothing at home.     Past Medical History:  Diagnosis Date  . Anxiety   . Dyslipidemia   . Gastroparesis due to DM ([place]) OCT 2014   75% AT 2 HRS, GLU >   . Headache(784.0)   . IDDM (insulin dependent diabetes mellitus) ([place])   . Neuropathy   . Nicotine addiction   . TIA (transient ischemic attack)    Dr. Merlene Laughter    Patient Active Problem List   Diagnosis Date Noted  . Personal history of noncompliance with medical treatment, presenting hazards to health 04/01/2018  . Gallbladder polyp 12/13/2016  . GERD (gastroesophageal reflux disease) 09/05/2016  . Constipation 10/12/2015  . Seasonal allergies 11/09/2014  . Depression with anxiety 06/18/2014  . Tight fascia 01/17/2014  . Degenerative disc disease, cervical 12/07/2013  . CTS (carpal tunnel syndrome) 07/08/2013  . Polyneuropathy 07/08/2013  . Essential hypertension, benign 07/04/2013  . Diabetic nephropathy ([place]) 06/23/2013  . Tennis elbow syndrome 04/15/2013  . Rotator cuff syndrome of left shoulder 04/15/2013  . Vitamin D deficiency 03/20/2013  . Onychomycosis of toenail 03/20/2013  . DIABETES MELLITUS, WITH RENAL COMPLICATIONS 34/19/3790  . Gastroparesis 06/16/2008  . Mixed hyperlipidemia 04/13/2008  . Generalized anxiety disorder 04/13/2008    Past Surgical History:  Procedure Laterality Date  . Cataract surgery Bilateral 01/20/2017, 02/03/2017  .  COLONOSCOPY N/A 05/09/2016   Procedure: COLONOSCOPY;  Surgeon: Danie Binder, MD;  Location: [facility];  Service: Endoscopy;  Laterality: N/A;  130  . ESOPHAGOGASTRODUODENOSCOPY N/A 08/31/2013   SLF: 1. Earky satiety nausea may be due to Gastroparesis/pyloric channel stenosis. 2. small hiatal hernia 3. Moderate erosive gastritis.   Marland Kitchen laser surgery bilateral eye       OB History   None      Home Medications    Prior to Admission medications   Medication Sig Start Date End Date Taking? Authorizing Provider  azelastine (ASTELIN) 0.1 % nasal spray Place 2 sprays into both nostrils 2 (two) times daily. Use in each nostril as directed Patient taking differently: Place 2 sprays into both nostrils 2 (two) times daily as needed for allergies. Use in each nostril as directed 01/15/17   Fayrene Helper, MD  Blood Glucose Monitoring Suppl (ONE TOUCH ULTRA MINI) w/Device KIT Use as directed 08/11/17   Fayrene Helper, MD  busPIRone (BUSPAR) 10 MG tablet Take 5 mg by mouth 2 (two) times daily. 02/19/18   [provider]  calcium-vitamin D (OSCAL WITH D) 500-200 MG-UNIT TABS tablet Take 1 tablet by mouth daily.    [provider]  diazepam (VALIUM) 10 MG tablet Take 10 mg by mouth daily as needed for anxiety.     [provider]  DUREZOL 0.05 % EMUL Apply 1 drop to eye daily at 8 pm. 01/21/17   [provider]  glucose blood (ONE TOUCH ULTRA TEST)  test strip Use as instructed 4 times daily dx e11.65 04/01/18   Cassandria Anger, MD  insulin aspart (NOVOLOG FLEXPEN) 100 UNIT/ML FlexPen INJECT 10 TO 20 UNITS THREE TIMES A DAY WITH MEALS 04/01/18   Nida, Marella Chimes, MD  Insulin Glargine (LANTUS SOLOSTAR) 100 UNIT/ML Solostar Pen Inject 40 Units into the skin daily at 10 pm. 04/01/18   Nida, Marella Chimes, MD  Insulin Pen Needle (SURE COMFORT PEN NEEDLES) 32G X 4 MM MISC Use daily with insulin 4 times a day 04/01/18   Cassandria Anger, MD  linaclotide  [facility]) 72 MCG capsule 1 PO 30 mins WITH your first meal 12/26/16   Carlis Stable, NP  lisinopril (PRINIVIL,ZESTRIL) 2.5 MG tablet TAKE ONE (1) TABLET BY MOUTH EVERY DAY 05/04/18   Fayrene Helper, MD  metoCLOPramide (REGLAN) 5 MG tablet TAKE ONE TO TWO TABLETS EVERY SIX HOURS AS NEEDED FOR NAUSEA OR VOMITING 09/25/17   Carlis Stable, NP  mirtazapine (REMERON) 30 MG tablet Take 1 tablet by mouth daily at 8 pm. 12/12/16   [provider]  mometasone (NASONEX) 50 MCG/ACT nasal spray Place 2 sprays into the nose daily. Patient taking differently: Place 2 sprays into the nose daily as needed (Allergies).  11/09/14   Fayrene Helper, MD  montelukast (SINGULAIR) 10 MG tablet TAKE ONE TABLET BY MOUTH EVERY NIGHT AT BEDTIME 01/15/17   Fayrene Helper, MD  Omega-3 Fatty Acids (FISH OIL) 1000 MG CAPS Take 1 capsule by mouth daily.    [provider]  omeprazole (PRILOSEC) 40 MG capsule TAKE ONE CAPSULE BY MOUTH 30 MINUTES PRIOR TO BREAKFAST AND SUPPER. 09/25/17   Carlis Stable, NP  ondansetron (ZOFRAN) 4 MG tablet TAKE ONE TABLET BY MOUTH EVERY EIGHT HOURS AS NEEDED FOR NAUSEA OR VOMITING 10/29/17   Mahala Menghini, PA-C  ONETOUCH DELICA LANCETS 36K MISC USE FOUR TIMES A DAY AS DIRECTED. 04/01/18   Cassandria Anger, MD  pravastatin (PRAVACHOL) 80 MG tablet TAKE ONE TABLET BY MOUTH IN THE EVENING. 12/10/17   Fayrene Helper, MD  vitamin B-12 (CYANOCOBALAMIN) 100 MCG tablet Take 1 tablet by mouth daily.    [provider]    Family History Family History  Problem Relation Age of Onset  . Hypertension Mother   . Diabetes Mother   . Hyperlipidemia Mother   . Rosacea Mother   . Hypertension Father   . Diabetes Brother   . Colon cancer Neg Hx     Social History Social History   Tobacco Use  . Smoking status: Former Smoker    Packs/day: 1.50    Years: 24.00    Pack years: 36.00    Types: Cigarettes    Last attempt to quit: 08/24/2011    Years since quitting:  6.7  . Smokeless tobacco: Never Used  Substance Use Topics  . Alcohol use: No  . Drug use: No     Allergies   Patient has no known allergies.   Review of Systems Review of Systems  All other systems reviewed and are negative.    Physical Exam Updated Vital Signs BP 139/81   Pulse 82   Temp 97.6 F (36.4 C)   Resp 18   Ht 5' 8" (1.727 m)   Wt 61.2 kg (135 lb)   LMP 07/26/2013   SpO2 100%   BMI 20.53 kg/m   Physical Exam  Constitutional: She is oriented to person, place, and time. She appears well-developed and  well-nourished.  nad  HENT:  Head: Normocephalic and atraumatic.  Eyes: Conjunctivae are normal.  Neck: Neck supple.  Cardiovascular: Normal rate and regular rhythm.  Pulmonary/Chest: Effort normal and breath sounds normal.  Abdominal: Soft. Bowel sounds are normal.  Genitourinary:  Genitourinary Comments: Minimal tenderness left flank.  Musculoskeletal: Normal range of motion.  Neurological: She is alert and oriented to person, place, and time.  Skin: Skin is warm and dry.  Psychiatric: She has a normal mood and affect. Her behavior is normal.  Nursing note and vitals reviewed.    ED Treatments / Results  Labs (all labs ordered are listed, but only abnormal results are displayed) Labs Reviewed  COMPREHENSIVE METABOLIC PANEL - Abnormal; Notable for the following components:      Result Value   Glucose, Bld 300 (*)    Creatinine, Ser 1.01 (*)    All other components within normal limits  URINALYSIS, ROUTINE W REFLEX MICROSCOPIC - Abnormal; Notable for the following components:   Glucose, UA 150 (*)    All other components within normal limits  CBC WITH DIFFERENTIAL/PLATELET    EKG None  Radiology Ct Renal Stone Study  Result Date: 05/18/2018 CLINICAL DATA:  Flank pain.  Suspect stone disease. EXAM: CT ABDOMEN AND PELVIS WITHOUT CONTRAST TECHNIQUE: Multidetector CT imaging of the abdomen and pelvis was performed following the standard  protocol without IV contrast. COMPARISON:  CT [facility] 07/05/2014 FINDINGS: Lower chest: No acute abnormality. Hepatobiliary: No focal liver abnormality is seen. No gallstones, gallbladder wall thickening, or biliary dilatation. Pancreas: Unremarkable. No pancreatic ductal dilatation or surrounding inflammatory changes. Spleen: Normal in size without focal abnormality. Adrenals/Urinary Tract: Normal appearance of the adrenal glands. No left renal calculi a or hydronephrosis identified. No kidney mass identified. No ureteral calculi or urinary bladder calculi noted. Stomach/Bowel: Stomach is within normal limits. Appendix appears normal. No evidence of bowel wall thickening, distention, or inflammatory changes. Vascular/Lymphatic: Aortic atherosclerosis. No aneurysm. No abdominal or pelvic adenopathy identified. Reproductive: Uterus and bilateral adnexa are unremarkable. Other: No abdominal wall hernia or abnormality. No abdominopelvic ascites. Musculoskeletal: No acute or significant osseous findings. IMPRESSION: 1. No acute findings. No urinary tract calculi identified to explain patient's left flank pain. 2.  Aortic Atherosclerosis (ICD10-I70.0). Electronically Signed   By: Kerby Moors M.D.   On: 05/18/2018 17:34    Procedures Procedures (including critical care time)  Medications Ordered in ED Medications - No data to display   Initial Impression / Assessment and Plan / ED Course  I have reviewed the triage vital signs and the nursing notes.  Pertinent labs & imaging results that were available during my care of the patient were reviewed by me and considered in my medical decision making (see chart for details).     Patient presents with left flank pain without urinary symptoms.  She is in no acute distress.  Urinalysis and renal CT negative.  Glucose elevated. NAD.  Discussed test results with the patient.  Tylenol for pain.  Final Clinical Impressions(s) / ED Diagnoses   Final diagnoses:    Left flank pain  Hyperglycemia    ED Discharge Orders    None       Nat Christen, MD 05/18/18 [id number]

## 2018-05-18 NOTE — ED Triage Notes (Signed)
Pt reports left flank pain x 1 1/2 weeks.  Denies urinary symptoms.

## 2018-05-18 NOTE — Telephone Encounter (Signed)
Pt.notified

## 2018-05-18 NOTE — ED Notes (Signed)
Pt returned from xray. nad 

## 2018-05-18 NOTE — Discharge Instructions (Addendum)
Tests showed no life-threatening condition.  Your glucose was elevated.  Tylenol for pain.  Follow-up with your primary care doctor.

## 2018-05-19 DIAGNOSIS — H43391 Other vitreous opacities, right eye: Secondary | ICD-10-CM | POA: Diagnosis not present

## 2018-05-19 DIAGNOSIS — E103513 Type 1 diabetes mellitus with proliferative diabetic retinopathy with macular edema, bilateral: Secondary | ICD-10-CM | POA: Diagnosis not present

## 2018-05-19 DIAGNOSIS — H35371 Puckering of macula, right eye: Secondary | ICD-10-CM | POA: Diagnosis not present

## 2018-05-19 DIAGNOSIS — H43821 Vitreomacular adhesion, right eye: Secondary | ICD-10-CM | POA: Diagnosis not present

## 2018-05-21 ENCOUNTER — Telehealth: Payer: Self-pay | Admitting: Family Medicine

## 2018-06-01 ENCOUNTER — Other Ambulatory Visit: Payer: Self-pay | Admitting: Gastroenterology

## 2018-06-02 ENCOUNTER — Other Ambulatory Visit (HOSPITAL_COMMUNITY)
Admission: RE | Admit: 2018-06-02 | Discharge: 2018-06-02 | Disposition: A | Discharge status: Home / Self Care | Payer: PPO | Source: Ambulatory Visit | Attending: Family Medicine | Admitting: Family Medicine

## 2018-06-02 ENCOUNTER — Ambulatory Visit (INDEPENDENT_AMBULATORY_CARE_PROVIDER_SITE_OTHER): Payer: PPO | Admitting: Family Medicine

## 2018-06-02 ENCOUNTER — Other Ambulatory Visit: Payer: Self-pay

## 2018-06-02 ENCOUNTER — Encounter: Payer: Self-pay | Admitting: Family Medicine

## 2018-06-02 VITALS — BP 114/86 | HR 90 | Resp 12 | Ht 68.0 in | Wt 135.0 lb

## 2018-06-02 DIAGNOSIS — Z23 Encounter for immunization: Secondary | ICD-10-CM

## 2018-06-02 DIAGNOSIS — Z0000 Encounter for general adult medical examination without abnormal findings: Secondary | ICD-10-CM

## 2018-06-02 DIAGNOSIS — Z124 Encounter for screening for malignant neoplasm of cervix: Secondary | ICD-10-CM | POA: Diagnosis not present

## 2018-06-02 DIAGNOSIS — E1165 Type 2 diabetes mellitus with hyperglycemia: Secondary | ICD-10-CM

## 2018-06-02 DIAGNOSIS — I10 Essential (primary) hypertension: Secondary | ICD-10-CM

## 2018-06-02 DIAGNOSIS — R1030 Lower abdominal pain, unspecified: Secondary | ICD-10-CM

## 2018-06-02 DIAGNOSIS — R194 Change in bowel habit: Secondary | ICD-10-CM | POA: Diagnosis not present

## 2018-06-02 DIAGNOSIS — E785 Hyperlipidemia, unspecified: Secondary | ICD-10-CM | POA: Diagnosis not present

## 2018-06-02 NOTE — Patient Instructions (Addendum)
F/U with mD in 4 months, call if you need me before  Pneumonia 23 today  You are being referred to Diabetic Dr of your choice  Pap sent today  You are being referred to Dr Oneida Alar re abdominal pain and poor bowel movements  Foot exam shows poor sensation so please examine daily  Labs needed, hBA1C, lipid, cmp and EGFR tSH and vit D fasting asap  Please schedule your mammogram

## 2018-06-05 ENCOUNTER — Encounter: Payer: Self-pay | Admitting: Family Medicine

## 2018-06-05 DIAGNOSIS — R194 Change in bowel habit: Secondary | ICD-10-CM | POA: Insufficient documentation

## 2018-06-05 LAB — CYTOLOGY - PAP
DIAGNOSIS: NEGATIVE
HPV (WINDOPATH): NOT DETECTED

## 2018-06-05 NOTE — Assessment & Plan Note (Signed)
After informed consent Pneumonia 23 administered by nurse

## 2018-06-05 NOTE — Assessment & Plan Note (Signed)
Annual exam as documented. Counseling done  re healthy lifestyle involving commitment to 150 minutes exercise per week, heart healthy diet, and attaining healthy weight.The importance of adequate sleep also discussed.  Immunization and cancer screening needs are specifically addressed at this visit.  

## 2018-06-05 NOTE — Assessment & Plan Note (Signed)
Uncontrolled diabetes, pateint requests change in treating Physician Ms. Heskett is reminded of the importance of commitment to daily physical activity for 30 minutes or more, as able and the need to limit carbohydrate intake to 30 to 60 grams per meal to help with blood sugar control.   The need to take medication as prescribed, test blood sugar as directed, and to call between visits if there is a concern that blood sugar is uncontrolled is also discussed.   Ms. Priestly is reminded of the importance of daily foot exam, annual eye examination, and good blood sugar, blood pressure and cholesterol control.  Diabetic Labs Latest Ref Rng & Units 05/18/2018 02/12/2018 10/18/2017 07/17/2017 07/11/2017  HbA1c <5.7 % of total Hgb - 8.9(H) 8.3(H) 8.7(H) -  Microalbumin Not estab mg/dL - - - - CANCELED  Micro/Creat Ratio <30 mcg/mg creat - - - - CANCELED  Chol <200 mg/dL - 197 - - 183  HDL >50 mg/dL - 72 - - 59  Calc LDL mg/dL (calc) - 106(H) - - 107(H)  Triglycerides <150 mg/dL - 92 - - 87  Creatinine 0.44 - 1.00 mg/dL 1.01(H) 1.14(H) 1.28(H) - 1.38(H)   BP/Weight 06/02/2018 05/18/2018 04/01/2018 03/11/2018 11/27/2017 11/18/3356 12/16/1896  Systolic BP 421 031 281 188 677 373 90  Diastolic BP 86 81 80 54 74 72 60  Wt. (Lbs) 135 135 135 136.12 134 131.25 130  BMI 20.53 20.53 20.53 20.7 20.37 19.96 19.77   Foot/eye exam completion dates Latest Ref Rng & Units 01/02/2018 11/27/2017  Eye Exam No Retinopathy Retinopathy(A) -  Foot Form Completion - - Done   Will refer per request to Dr Forde Dandy

## 2018-06-05 NOTE — Assessment & Plan Note (Signed)
pHQ 9 score of 18, has a  therapist, needs to establish with psychiatry as this is inadequate will need to contact her to further  address, not suicidal or homicidal

## 2018-06-05 NOTE — Progress Notes (Signed)
Ashley Armstrong     MRN: [id number]      DOB: 1966/02/02  HPI: Patient is in for annual physical exam. Requests referral to new endocrinologist C/o lower abdominal pain and poor bowel movements, needs to see gI , has gastroparesis Immunization is reviewed , and  updated if needed.   PE: BP 114/86 (BP Location: Left Arm, Patient Position: Sitting, Cuff Size: Normal)   Pulse 90   Ht 5\' 8"  (1.727 m)   Wt 135 lb (61.2 kg)   LMP 07/26/2013   SpO2 98%   BMI 20.53 kg/m   Pleasant  female, alert and oriented x 3, in no cardio-pulmonary distress. Afebrile. HEENT No facial trauma or asymetry. Sinuses non tender.  Extra occullar muscles intact,. External ears normal, tympanic membranes clear. Oropharynx moist, no exudate. Neck: supple, no adenopathy,JVD or thyromegaly.No bruits.  Chest: Clear to ascultation bilaterally.No crackles or wheezes. Non tender to palpation  Breast: No asymetry,no masses or lumps. No tenderness. No nipple discharge or inversion. No axillary or supraclavicular adenopathy  Cardiovascular system; Heart sounds normal,  S1 and  S2 ,no S3.  No murmur, or thrill. Apical beat not displaced Peripheral pulses normal.  Abdomen: Soft, non tender, no organomegaly or masses. No bruits. Bowel sounds normal. No guarding, tenderness or rebound. Needs 3 stool cards   GU: External genitalia normal female genitalia , normal female distribution of hair. No lesions. Urethral meatus normal in size, no  Prolapse, no lesions visibly  Present. Bladder non tender. Vagina pink and moist , with no visible lesions , discharge present . Adequate pelvic support no  cystocele or rectocele noted Cervix pink and appears healthy, no lesions or ulcerations noted, no discharge noted from os Uterus normal size, no adnexal masses, no cervical motion or adnexal tenderness.   Musculoskeletal exam: Full ROM of spine, hips , shoulders and knees. No deformity ,swelling or crepitus  noted. No muscle wasting or atrophy.   Neurologic: Cranial nerves 2 to 12 intact. Power, tone ,sensation and reflexes normal throughout. No disturbance in gait. No tremor.  Skin: Intact, no ulceration, erythema ,thickened great toenails Pigmentation normal throughout  Psych; Normal mood and affect. Judgement and concentration normal   Assessment & Plan:  Annual physical exam Annual exam as documented. Counseling done  re healthy lifestyle involving commitment to 150 minutes exercise per week, heart healthy diet, and attaining healthy weight.The importance of adequate sleep also discussed.  Immunization and cancer screening needs are specifically addressed at this visit.   Depression with anxiety pHQ 9 score of 18, has a  therapist, needs to establish with psychiatry as this is inadequate will need to contact her to further  address, not suicidal or homicidal  Need for vaccination for Strep pneumoniae After informed consent Pneumonia 23 administered by nurse  Diabetes mellitus, insulin dependent (IDDM), uncontrolled ([place]) Uncontrolled diabetes, pateint requests change in treating Physician Ms. Halvorsen is reminded of the importance of commitment to daily physical activity for 30 minutes or more, as able and the need to limit carbohydrate intake to 30 to 60 grams per meal to help with blood sugar control.   The need to take medication as prescribed, test blood sugar as directed, and to call between visits if there is a concern that blood sugar is uncontrolled is also discussed.   Ms. Tarnow is reminded of the importance of daily foot exam, annual eye examination, and good blood sugar, blood pressure and cholesterol control.  Diabetic Labs Latest Ref Rng &  Units 05/18/2018 02/12/2018 10/18/2017 07/17/2017 07/11/2017  HbA1c <5.7 % of total Hgb - 8.9(H) 8.3(H) 8.7(H) -  Microalbumin Not estab mg/dL - - - - CANCELED  Micro/Creat Ratio <30 mcg/mg creat - - - - CANCELED  Chol <200 mg/dL - 197 -  - 183  HDL >50 mg/dL - 72 - - 59  Calc LDL mg/dL (calc) - 106(H) - - 107(H)  Triglycerides <150 mg/dL - 92 - - 87  Creatinine 0.44 - 1.00 mg/dL 1.01(H) 1.14(H) 1.28(H) - 1.38(H)   BP/Weight 06/02/2018 05/18/2018 04/01/2018 03/11/2018 11/27/2017 06/12/600 03/16/1536  Systolic BP 943 276 147 092 957 473 90  Diastolic BP 86 81 80 54 74 72 60  Wt. (Lbs) 135 135 135 136.12 134 131.25 130  BMI 20.53 20.53 20.53 20.7 20.37 19.96 19.77   Foot/eye exam completion dates Latest Ref Rng & Units 01/02/2018 11/27/2017  Eye Exam No Retinopathy Retinopathy(A) -  Foot Form Completion - - Done   Will refer per request to Dr Forde Dandy     Change in stool habits C./o change in bowel habits and lower abdominal pain, has established diagnosis of gastroparesis, will refer to gI for re eval

## 2018-06-05 NOTE — Assessment & Plan Note (Signed)
C./o change in bowel habits and lower abdominal pain, has established diagnosis of gastroparesis, will refer to gI for re eval

## 2018-06-08 ENCOUNTER — Encounter: Payer: Self-pay | Admitting: Gastroenterology

## 2018-06-17 ENCOUNTER — Telehealth: Payer: Self-pay | Admitting: Family Medicine

## 2018-06-17 NOTE — Telephone Encounter (Signed)
Do you want her to get this from the prescriber (Nida) or ok to refill?

## 2018-06-17 NOTE — Telephone Encounter (Signed)
Pt is seeing Dr Dwyane Dee, Sept 20, and needs a script for Novolog and a refill, until she gets in to see him.. Please send to [facility].

## 2018-06-17 NOTE — Telephone Encounter (Signed)
Pt LVM for [place] to call, I returned call and LVM

## 2018-06-17 NOTE — Telephone Encounter (Signed)
P[ls send the script in

## 2018-06-18 ENCOUNTER — Other Ambulatory Visit: Payer: Self-pay

## 2018-06-18 MED ORDER — INSULIN ASPART 100 UNIT/ML FLEXPEN: PEN_INJECTOR | SUBCUTANEOUS | 0 refills | Status: DC

## 2018-06-18 NOTE — Telephone Encounter (Signed)
Done

## 2018-06-24 ENCOUNTER — Other Ambulatory Visit: Payer: Self-pay | Admitting: Family Medicine

## 2018-06-25 ENCOUNTER — Other Ambulatory Visit: Payer: Self-pay | Admitting: Family Medicine

## 2018-06-30 ENCOUNTER — Ambulatory Visit: Payer: PPO | Admitting: Sports Medicine

## 2018-07-16 ENCOUNTER — Other Ambulatory Visit: Payer: Self-pay | Admitting: Family Medicine

## 2018-07-16 DIAGNOSIS — Z1231 Encounter for screening mammogram for malignant neoplasm of breast: Secondary | ICD-10-CM

## 2018-07-22 DIAGNOSIS — F329 Major depressive disorder, single episode, unspecified: Secondary | ICD-10-CM | POA: Diagnosis not present

## 2018-07-24 ENCOUNTER — Ambulatory Visit (HOSPITAL_COMMUNITY)
Admission: RE | Admit: 2018-07-24 | Discharge: 2018-07-24 | Disposition: A | Discharge status: Home / Self Care | Payer: PPO | Source: Ambulatory Visit | Attending: Family Medicine | Admitting: Family Medicine

## 2018-07-24 ENCOUNTER — Encounter (HOSPITAL_COMMUNITY): Payer: Self-pay

## 2018-07-24 DIAGNOSIS — Z1231 Encounter for screening mammogram for malignant neoplasm of breast: Secondary | ICD-10-CM | POA: Insufficient documentation

## 2018-07-27 ENCOUNTER — Ambulatory Visit (HOSPITAL_COMMUNITY): Payer: PPO

## 2018-07-30 ENCOUNTER — Other Ambulatory Visit: Payer: Self-pay | Admitting: Family Medicine

## 2018-07-30 NOTE — Progress Notes (Signed)
Patient ID: Ashley Armstrong, female   DOB: 1965/12/31, 52 y.o.   MRN: [id number]          Reason for Appointment: Consultation for Type 1 Diabetes  Referring physician: Dr. [name]   History of Present Illness:          Date of diagnosis of type 1 diabetes mellitus: Age 43       Background history:  She has been on insulin practically since her diagnosis was made.  Initially she had symptoms of weight loss and blurred vision For several years has been on Lantus and NovoLog but usually has had poor control with A1c mostly between 8-10%  Recent history:   Most recent A1c is 8.9 done in 9/19   INSULIN regimen is:  Lantus once a day, 60 units at 3 pm.  NovoLog mostly 10-12 units before meals       Current management, blood sugar patterns and problems identified:  She is taking fixed dose of NovoLog at mealtimes and usually taking it right when she is eating a meal  She is reportedly using a sliding scale but has a basal amount of 10 units, appears to be mostly taking 10 units unless blood sugar is over 200 when she takes 12 units  She has been told to take variable amounts of Lantus by her various physicians but she thinks 60 units works better and not clear why she takes it in the afternoon  She has variable readings FASTING but recently averaging only about 150  Blood sugars are mostly variable at lunchtime but more consistently higher at suppertime and bedtime   Occasionally will feel hypoglycemic between 10-11 AM but usually not otherwise and rarely overnight  She thinks that she is probably having high readings because of usually not planning her meals well, eating a lot of frozen foods and relatively high fat meals, she may have done better previously when she was trying to eat more healthy plan meals  She is always been on fingersticks with a glucose monitor and mostly using One Touch recently, has not been advised the CGM for monitoring  Usually rotating her  injection sites although not using her abdomen           Meal times are:  Breakfast is 7 AM at Lunch: 12 PM dinner: 5 PM  Typical meal intake: Breakfast is frequently high fat but otherwise eggs and toast.  Lunch usually a mixed meal and evening meal may be a sandwich with bland food.  Snacks will be cheese and crackers Usually avoiding drinks with sugar Eating out only about once a week at fast food restaurants               Exercise: None    Glucose monitoring:  done 4 times a day         Glucometer: One Touch.       Blood Glucose readings by time of day and averages from record    PREMEAL Breakfast Lunch Dinner Bedtime  Overall   Glucose range:  70-199  112-298  184-238  118-264   Median:        POST-MEAL PC Breakfast PC Lunch PC Dinner  Glucose range:     Median:       Dietician visit, most recent: 11/18  Weight history:  Wt Readings from Last 3 Encounters:  07/31/18 136 lb (61.7 kg)  06/02/18 135 lb (61.2 kg)  05/18/18 135 lb (61.2 kg)    Glycemic  control:   Lab Results  Component Value Date   HGBA1C 8.9 (A) 07/31/2018   HGBA1C 8.9 (H) 02/12/2018   HGBA1C 8.3 (H) 10/18/2017   Lab Results  Component Value Date   MICROALBUR 6.1 (H) 07/31/2018   LDLCALC 106 (H) 02/12/2018   CREATININE 1.01 (H) 05/18/2018   Lab Results  Component Value Date   MICRALBCREAT 4.3 07/31/2018    No results found for: FRUCTOSAMINE  Office Visit on 07/31/2018  Component Date Value Ref Range Status  . Hemoglobin A1C 07/31/2018 8.9* 4.0 - 5.6 % Final  . Color, Urine 07/31/2018 YELLOW  Yellow;Lt. Yellow Final  . APPearance 07/31/2018 CLEAR  Clear Final  . Specific Gravity, Urine 07/31/2018 1.015  1.000 - 1.030 Final  . pH 07/31/2018 6.0  5.0 - 8.0 Final  . Total Protein, Urine 07/31/2018 NEGATIVE  Negative Final  . Urine Glucose 07/31/2018 NEGATIVE  Negative Final  . Ketones, ur 07/31/2018 NEGATIVE  Negative Final  . Bilirubin Urine 07/31/2018 NEGATIVE  Negative Final  . Hgb  urine dipstick 07/31/2018 NEGATIVE  Negative Final  . Urobilinogen, UA 07/31/2018 0.2  0.0 - 1.0 Final  . Leukocytes, UA 07/31/2018 NEGATIVE  Negative Final  . Nitrite 07/31/2018 NEGATIVE  Negative Final  . WBC, UA 07/31/2018 0-2/hpf  0-2/hpf Final  . RBC / HPF 07/31/2018 none seen  0-2/hpf Final  . Squamous Epithelial / LPF 07/31/2018 Rare(0-4/hpf)  Rare(0-4/hpf) Final  . Bacteria, UA 07/31/2018 Rare(<10/hpf)* None Final  . Microalb, Ur 07/31/2018 6.1* 0.0 - 1.9 mg/dL Final  . Creatinine,U 07/31/2018 143.7  mg/dL Final  . Microalb Creat Ratio 07/31/2018 4.3  0.0 - 30.0 mg/g Final    Allergies as of 07/31/2018   No Known Allergies     Medication List        Accurate as of 07/31/18 11:59 PM. Always use your most recent med list.          busPIRone 10 MG tablet Commonly known as:  BUSPAR Take 5 mg by mouth 2 (two) times daily.   calcium-vitamin D 500-200 MG-UNIT Tabs tablet Commonly known as:  OSCAL WITH D Take 1 tablet by mouth daily.   diazepam 10 MG tablet Commonly known as:  VALIUM Take 10 mg by mouth daily as needed for anxiety.   DUREZOL 0.05 % Emul Generic drug:  Difluprednate Apply 1 drop to eye daily at 8 pm.   Fish Oil 1000 MG Caps Take 1 capsule by mouth daily.   FREESTYLE LIBRE 14 DAY READER Devi 1 Device by Does not apply route once for 1 dose.   FREESTYLE LIBRE 14 DAY SENSOR Misc 1 Units by Does not apply route every 14 (fourteen) days.   glucose blood test strip TEST FOUR TIMES A DAY AS DIRECTED.   Insulin Glargine 100 UNIT/ML Solostar Pen Commonly known as:  LANTUS Inject 40 Units into the skin daily at 10 pm.   Insulin Pen Needle 32G X 4 MM Misc Use daily with insulin 4 times a day   linaclotide 72 MCG capsule Commonly known as:  LINZESS 1 PO 30 mins WITH your first meal   lisinopril 2.5 MG tablet Commonly known as:  PRINIVIL,ZESTRIL TAKE ONE (1) TABLET BY MOUTH EVERY DAY   metoCLOPramide 5 MG tablet Commonly known as:  REGLAN TAKE  ONE TO TWO TABLETS EVERY SIX HOURS AS NEEDED FOR NAUSEA OR VOMITING   mirtazapine 30 MG tablet Commonly known as:  REMERON Take 1 tablet by mouth daily at 8 pm.  montelukast 10 MG tablet Commonly known as:  SINGULAIR TAKE ONE TABLET BY MOUTH EVERY NIGHT AT BEDTIME   NOVOLOG FLEXPEN 100 UNIT/ML FlexPen Generic drug:  insulin aspart INJECT 10 TO 20 UNITS THREE TIMES A DAY WITH MEALS   omeprazole 40 MG capsule Commonly known as:  PRILOSEC TAKE ONE CAPSULE BY MOUTH 30 MINUTES PRIOR TO BREAKFAST AND SUPPER.   ondansetron 4 MG tablet Commonly known as:  ZOFRAN TAKE ONE TABLET BY MOUTH EVERY EIGHT HOURS AS NEEDED FOR NAUSEA OR VOMITING   ONE TOUCH ULTRA MINI w/Device Kit Use as directed   ONETOUCH DELICA LANCETS 54M Misc USE FOUR TIMES A DAY AS DIRECTED.   pravastatin 80 MG tablet Commonly known as:  PRAVACHOL TAKE ONE TABLET BY MOUTH IN THE EVENING   vitamin B-12 100 MCG tablet Commonly known as:  CYANOCOBALAMIN Take 1 tablet by mouth daily.       Allergies: No Known Allergies  Past Medical History:  Diagnosis Date  . Anxiety   . Dyslipidemia   . Gastroparesis due to DM ([place]) OCT 2014   75% AT 2 HRS, GLU >   . H/O eye surgery 01/2018  . Headache(784.0)   . IDDM (insulin dependent diabetes mellitus) ([place])   . Neuropathy   . Nicotine addiction   . TIA (transient ischemic attack)    Dr. Merlene Laughter    Past Surgical History:  Procedure Laterality Date  . Cataract surgery Bilateral 01/20/2017, 02/03/2017  . COLONOSCOPY N/A 05/09/2016   Procedure: COLONOSCOPY;  Surgeon: Danie Binder, MD;  Location: [facility];  Service: Endoscopy;  Laterality: N/A;  130  . ESOPHAGOGASTRODUODENOSCOPY N/A 08/31/2013   SLF: 1. [place]y satiety nausea may be due to Gastroparesis/pyloric channel stenosis. 2. small hiatal hernia 3. Moderate erosive gastritis.   Marland Kitchen EYE SURGERY  01/2018  . laser surgery bilateral eye      Family History  Problem Relation Age of Onset  . Hypertension  Mother   . Diabetes Mother   . Hyperlipidemia Mother   . Rosacea Mother   . Hypertension Father   . Diabetes Brother   . Breast cancer Maternal Aunt   . Colon cancer Neg Hx     Social History:  reports that she quit smoking about 6 years ago. Her smoking use included cigarettes. She has a 36.00 pack-year smoking history. She has never used smokeless tobacco. She reports that she does not drink alcohol or use drugs.   Review of Systems  Constitutional: Positive for weight gain.  HENT: Negative for headaches.   Respiratory: Negative for shortness of breath.   Cardiovascular: Negative for leg swelling.  Gastrointestinal: Positive for nausea and constipation.       Has had reflux symptoms.  Reportedly has had confirming gastroparesis, at times has significant fullness or nausea but taking Reglan only randomly early morning and evenings and not before meals  Endocrine: Negative for fatigue.  Genitourinary: Negative for nocturia.  Musculoskeletal: Negative for joint pain.  Skin: Negative for rash.       Occasionally has sweating spells but mostly in her abdomen area  Neurological: Positive for numbness.       Mild burning, recently less  Psychiatric/Behavioral: Positive for depressed mood.       Recently on duloxetine, now only on antianxiety medicine, followed by psychiatrist?     Lipid history: Followed by PCP, currently on pravastatin 80 mg Labs as follows    Lab Results  Component Value Date   CHOL 197 02/12/2018  HDL 72 02/12/2018   LDLCALC 106 (H) 02/12/2018   TRIG 92 02/12/2018   CHOLHDL 2.7 02/12/2018           Hypertension: Has been present  BP Readings from Last 3 Encounters:  07/31/18 132/80  06/02/18 114/86  05/18/18 139/81    Most recent eye exam was in 5/19  Most recent foot exam: 9/19  Currently known complications of diabetes: Gastroparesis, retinopathy, no nephropathy  LABS:  Office Visit on 07/31/2018  Component Date Value Ref Range Status    . Hemoglobin A1C 07/31/2018 8.9* 4.0 - 5.6 % Final  . Color, Urine 07/31/2018 YELLOW  Yellow;Lt. Yellow Final  . APPearance 07/31/2018 CLEAR  Clear Final  . Specific Gravity, Urine 07/31/2018 1.015  1.000 - 1.030 Final  . pH 07/31/2018 6.0  5.0 - 8.0 Final  . Total Protein, Urine 07/31/2018 NEGATIVE  Negative Final  . Urine Glucose 07/31/2018 NEGATIVE  Negative Final  . Ketones, ur 07/31/2018 NEGATIVE  Negative Final  . Bilirubin Urine 07/31/2018 NEGATIVE  Negative Final  . Hgb urine dipstick 07/31/2018 NEGATIVE  Negative Final  . Urobilinogen, UA 07/31/2018 0.2  0.0 - 1.0 Final  . Leukocytes, UA 07/31/2018 NEGATIVE  Negative Final  . Nitrite 07/31/2018 NEGATIVE  Negative Final  . WBC, UA 07/31/2018 0-2/hpf  0-2/hpf Final  . RBC / HPF 07/31/2018 none seen  0-2/hpf Final  . Squamous Epithelial / LPF 07/31/2018 Rare(0-4/hpf)  Rare(0-4/hpf) Final  . Bacteria, UA 07/31/2018 Rare(<10/hpf)* None Final  . Microalb, Ur 07/31/2018 6.1* 0.0 - 1.9 mg/dL Final  . Creatinine,U 07/31/2018 143.7  mg/dL Final  . Microalb Creat Ratio 07/31/2018 4.3  0.0 - 30.0 mg/g Final    Physical Examination:  BP 132/80 (BP Location: Left Arm, Patient Position: Sitting, Cuff Size: Normal)   Pulse 80   Temp 97.9 F (36.6 C) (Oral)   Ht '5\' 8"'  (1.727 m)   Wt 136 lb (61.7 kg)   LMP 07/26/2013   BMI 20.68 kg/m   GENERAL:    She is averagely built and nourished  HEENT:         Eye exam shows normal external appearance.  Fundus exam shows no retinopathy.  Oral exam shows normal mucosa .   NECK:   There is no lymphadenopathy  Thyroid is not enlarged and no nodules felt.   Carotids are normal to palpation and no bruit heard  LUNGS:         Chest is symmetrical. Lungs are clear to auscultation.Marland Kitchen   HEART:         Heart sounds:  S1 and S2 are normal. No murmur or click heard., no S3 or S4.   ABDOMEN:   There is no distention present. Liver and spleen are not palpable.  No other mass or tenderness present.     NEUROLOGICAL:   Ankle jerks are absent bilaterally.    Diabetic Foot Exam - Simple   Simple Foot Form Diabetic Foot exam was performed with the following findings:  Yes   Visual Inspection No deformities, no ulcerations, no other skin breakdown bilaterally:  Yes Sensation Testing See comments:  Yes Pulse Check Posterior Tibialis and Dorsalis pulse intact bilaterally:  Yes Comments Absent monofilament sensation in feet and toes            Vibration sense is practically absent in distal first toes.  MUSCULOSKELETAL:  There is no swelling or deformity of the peripheral joints.     EXTREMITIES:     There is  no ankle edema.  SKIN:       No rash or lesions of concern.        ASSESSMENT:  Diabetes type 1, consistently poorly controlled  Recent A1c 8.9  See history of present illness for detailed discussion of current diabetes management, blood sugar patterns and problems identified   She is on unusually large doses of Lantus once a day but this is not causing hypoglycemia Her blood sugars are relatively good fasting but most of the time around 200 later in the day  However her Lantus may not be lasting 24 hours consistently also with taking it once a day in the mid afternoon She likely needs higher doses of mealtime insulin than what she is taking Currently taking mealtime dose with a fixed amount and no adjustment for carbohydrate intake or type of meals/fat intake Relatively her basal insulin she is taking a small amount of mealtime insulin only  Some of the difficulty with her control is also related to the relatively high fat diet She does need more motivation, currently not exercising also Overall does need more diabetes education  Complications of diabetes: Proliferative retinopathy, gastroparesis, neuropathy with sensory loss  Hypercholesterolemia with last LDL still over 100 on medication, followed by PCP, may benefit from a more potent statin  Hypertension: Mild and  well controlled  PLAN:    Change Lantus to 30 units twice daily for more effective 24 controlled and less variability  Recommend that she should at some point switch to [place] or [place] insulin for more consistent day-to-day control and less variability but she is reluctant to change at this time  She needs to be seen for more diabetes education including meal planning and carbohydrate counting  She will try to look up carbohydrate intake at mealtimes and will try to use 1: 5 carbohydrate coverage along with additional insulin for higher fat meals up to 50%  She will also continue to add additional amounts for high sugars over 150  She was recommended using the freestyle libre system and discussed that she will have much more complete information on her blood sugar patterns along with increased convenience and ability to control postprandial readings better as related at potential hypoglycemia.  She will be instructed on this by diabetes educator  She was recommended an insulin pump also but she is reluctant to consider this now  For her gastroparesis she will need to take her METOCLOPRAMIDE consistently before every meal and may take 10 mg at times if still having nausea  Regular walking for exercise  Better diet with lower fat content  Check urine MICROALBUMIN today  Patient Instructions  METOCLOPRAMIDE, take 1-2 tablets 30 min before each meal  LANTUS insulin: Change to 30 units on waking up and another 30 units about 12 hours later  If your morning sugars are consistently below 90 or over 140 decrease or INCREASE the Lantus in the evening by 2 units every time every 3 days or so  Divide the grams of carbohydrate you eating at each meal by 5 to get the dose of NovoLog For high fat meals add another 50% insulin For example if eating 40 g of carbohydrate with low fat meal take 8 units and for high fat meal take 12 units  Continue to increase the dose of NovoLog above the  mealtime coverage does for high sugars with sliding scale  Start regular walking Cut back on high fat foods     Consultation note has been  sent to the referring physician  Counseling time on subjects discussed in assessment and plan sections is over 50% of today's 60 minute visit  Ashley Armstrong 08/02/2018, 8:55 PM   Note: This office note was prepared with Dragon voice recognition system technology. Any transcriptional errors that result from this process are unintentional.

## 2018-07-31 ENCOUNTER — Encounter: Payer: Self-pay | Admitting: Endocrinology

## 2018-07-31 ENCOUNTER — Ambulatory Visit (INDEPENDENT_AMBULATORY_CARE_PROVIDER_SITE_OTHER): Payer: PPO | Admitting: Endocrinology

## 2018-07-31 VITALS — BP 132/80 | HR 80 | Temp 97.9°F | Ht 68.0 in | Wt 136.0 lb

## 2018-07-31 DIAGNOSIS — E1065 Type 1 diabetes mellitus with hyperglycemia: Secondary | ICD-10-CM | POA: Diagnosis not present

## 2018-07-31 DIAGNOSIS — E1043 Type 1 diabetes mellitus with diabetic autonomic (poly)neuropathy: Secondary | ICD-10-CM | POA: Diagnosis not present

## 2018-07-31 DIAGNOSIS — K3184 Gastroparesis: Secondary | ICD-10-CM

## 2018-07-31 DIAGNOSIS — E1042 Type 1 diabetes mellitus with diabetic polyneuropathy: Secondary | ICD-10-CM

## 2018-07-31 LAB — URINALYSIS, ROUTINE W REFLEX MICROSCOPIC
Bilirubin Urine: NEGATIVE
Hgb urine dipstick: NEGATIVE
Ketones, ur: NEGATIVE
LEUKOCYTES UA: NEGATIVE
Nitrite: NEGATIVE
RBC / HPF: NONE SEEN (ref 0–?)
SPECIFIC GRAVITY, URINE: 1.015 (ref 1.000–1.030)
TOTAL PROTEIN, URINE-UPE24: NEGATIVE
URINE GLUCOSE: NEGATIVE
UROBILINOGEN UA: 0.2 (ref 0.0–1.0)
pH: 6 (ref 5.0–8.0)

## 2018-07-31 LAB — MICROALBUMIN / CREATININE URINE RATIO
Creatinine,U: 143.7 mg/dL
MICROALB UR: 6.1 mg/dL — AB (ref 0.0–1.9)
Microalb Creat Ratio: 4.3 mg/g (ref 0.0–30.0)

## 2018-07-31 LAB — POCT GLYCOSYLATED HEMOGLOBIN (HGB A1C): Hemoglobin A1C: 8.9 % — AB (ref 4.0–5.6)

## 2018-07-31 MED ORDER — FREESTYLE LIBRE 14 DAY SENSOR MISC: 1.0000 [IU] | 4 refills | Status: DC

## 2018-07-31 MED ORDER — FREESTYLE LIBRE 14 DAY READER DEVI: 1.0000 | Freq: Once | 0 refills | Status: DC

## 2018-07-31 NOTE — Patient Instructions (Addendum)
METOCLOPRAMIDE, take 1-2 tablets 30 min before each meal  LANTUS insulin: Change to 30 units on waking up and another 30 units about 12 hours later  If your morning sugars are consistently below 90 or over 140 decrease or INCREASE the Lantus in the evening by 2 units every time every 3 days or so  Divide the grams of carbohydrate you eating at each meal by 5 to get the dose of NovoLog For high fat meals add another 50% insulin For example if eating 40 g of carbohydrate with low fat meal take 8 units and for high fat meal take 12 units  Continue to increase the dose of NovoLog above the mealtime coverage does for high sugars with sliding scale  Start regular walking Cut back on high fat foods

## 2018-08-02 DIAGNOSIS — K3184 Gastroparesis: Secondary | ICD-10-CM

## 2018-08-02 DIAGNOSIS — E1043 Type 1 diabetes mellitus with diabetic autonomic (poly)neuropathy: Secondary | ICD-10-CM | POA: Insufficient documentation

## 2018-08-02 DIAGNOSIS — E1042 Type 1 diabetes mellitus with diabetic polyneuropathy: Secondary | ICD-10-CM | POA: Insufficient documentation

## 2018-08-12 ENCOUNTER — Other Ambulatory Visit: Payer: Self-pay | Admitting: Family Medicine

## 2018-08-12 ENCOUNTER — Other Ambulatory Visit: Payer: Self-pay | Admitting: Endocrinology

## 2018-09-02 ENCOUNTER — Other Ambulatory Visit: Payer: Self-pay | Admitting: Family Medicine

## 2018-09-08 ENCOUNTER — Ambulatory Visit: Payer: PPO | Admitting: Sports Medicine

## 2018-09-11 ENCOUNTER — Ambulatory Visit (INDEPENDENT_AMBULATORY_CARE_PROVIDER_SITE_OTHER): Payer: PPO | Admitting: Internal Medicine

## 2018-09-11 ENCOUNTER — Other Ambulatory Visit: Payer: Self-pay | Admitting: Endocrinology

## 2018-09-11 ENCOUNTER — Ambulatory Visit: Payer: PPO | Admitting: Internal Medicine

## 2018-09-11 ENCOUNTER — Encounter: Payer: Self-pay | Admitting: Internal Medicine

## 2018-09-11 VITALS — BP 122/76 | HR 80 | Ht 68.0 in | Wt 135.0 lb

## 2018-09-11 DIAGNOSIS — E1065 Type 1 diabetes mellitus with hyperglycemia: Secondary | ICD-10-CM

## 2018-09-11 MED ORDER — GLUCOSE BLOOD VI STRP: ORAL_STRIP | 11 refills | Status: DC

## 2018-09-11 NOTE — Progress Notes (Signed)
Name: Ashley Armstrong  Age/ Sex: 52 y.o., female   MRN/ DOB: [id number], 07-06-1966     PCP: Fayrene Helper, MD   Reason for Endocrinology Evaluation: Type 1 Diabetes Mellitus  Initial Endocrine Consultative Visit: 2019    PATIENT IDENTIFIER: Ashley Armstrong is a 52 y.o. female with a past medical history of T1DM. The patient has followed with Endocrinology clinic since 07/31/18 for consultative assistance with management of her diabetes.  DIABETIC HISTORY:  Ashley Armstrong was diagnosed with T1DM at age 31, and started insulin at diagnosis. Her hemoglobin A1c has ranged from 7.9%  in 211, peaking at 10.3% in 2009.   SUBJECTIVE:   During the last visit (07/31/18): Her A1c 8.9%. Lantus changed to 30 units BID and was advised to go from a fixed dose of Novolog 10 units to 1:5 CHO + correctional insulin   Today (09/13/2018): Ashley Armstrong  She checks her blood sugars 2-3  times daily. The patient has not had hypoglycemic episodes since the last clinic visit.  Otherwise, the patient has not required any recent emergency interventions for hypoglycemia and has not had recent hospitalizations secondary to hyper or hypoglycemic episodes.   She eats during the day and night, tends to snack a lot, avoid sugar sweetened beverages .  She used to work a 3rd shift and has accustomed to staying up lat eat night, and hence tends to eat while up at night.   ROS: As per HPI and as detailed below: Review of Systems  Constitutional: Negative.   HENT: Negative.   Respiratory: Negative.   Cardiovascular: Negative.   Skin: Negative.       HOME DIABETES REGIMEN:  Lantus 30 units bid Novolog 14 units TID     CONTINUOUS GLUCOSE MONITORING RECORD INTERPRETATION    Dates of Recording: 10/19-11/1/19  Sensor description: [employer]  Results statistics:   CGM use % of time 66  Average and SD 70.9  Time in range       29  %  % Time Above 180 70  % Time above 250 32  % Time Below target 1    Glycemic patterns summary: Hyperglycemia all day and night  Hyperglycemic episodes  Worse around midnight  Hypoglycemic episodes occurred 0  Overnight periods: high glucose     HISTORY:  Past Medical History:  Past Medical History:  Diagnosis Date  . Anxiety   . Dyslipidemia   . Gastroparesis due to DM ([place]) OCT 2014   75% AT 2 HRS, GLU >   . H/O eye surgery 01/2018  . Headache(784.0)   . IDDM (insulin dependent diabetes mellitus) ([place])   . Neuropathy   . Nicotine addiction   . TIA (transient ischemic attack)    Dr. Merlene Laughter   Past Surgical History:  Past Surgical History:  Procedure Laterality Date  . Cataract surgery Bilateral 01/20/2017, 02/03/2017  . COLONOSCOPY N/A 05/09/2016   Procedure: COLONOSCOPY;  Surgeon: Danie Binder, MD;  Location: [facility];  Service: Endoscopy;  Laterality: N/A;  130  . ESOPHAGOGASTRODUODENOSCOPY N/A 08/31/2013   SLF: 1. Earky satiety nausea may be due to Gastroparesis/pyloric channel stenosis. 2. small hiatal hernia 3. Moderate erosive gastritis.   [facility] EYE SURGERY  01/2018  . laser surgery bilateral eye      Social History:  reports that she quit smoking about 7 years ago. Her smoking use included cigarettes. She has a 36.00 pack-year smoking history. She has never used smokeless tobacco. She  reports that she does not drink alcohol or use drugs. Family History:  Family History  Problem Relation Age of Onset  . Hypertension Mother   . Diabetes Mother   . Hyperlipidemia Mother   . Rosacea Mother   . Hypertension Father   . Diabetes Brother   . Breast cancer Maternal Aunt   . Colon cancer Neg Hx      HOME MEDICATIONS: Allergies as of 09/11/2018   No Known Allergies     Medication List        Accurate as of 09/11/18 11:59 PM. Always use your most recent med list.          busPIRone 10 MG tablet Commonly known as:  BUSPAR Take 5 mg by mouth 2  (two) times daily.   calcium-vitamin D 500-200 MG-UNIT Tabs tablet Commonly known as:  OSCAL WITH D Take 1 tablet by mouth daily.   diazepam 10 MG tablet Commonly known as:  VALIUM Take 10 mg by mouth daily as needed for anxiety.   DUREZOL 0.05 % Emul Generic drug:  Difluprednate Apply 1 drop to eye daily at 8 pm.   Fish Oil 1000 MG Caps Take 1 capsule by mouth daily.   FREESTYLE LIBRE 14 DAY SENSOR Misc 1 Units by Does not apply route every 14 (fourteen) days.   glucose blood test strip TEST FOUR TIMES A DAY AS DIRECTED.   insulin aspart 100 UNIT/ML FlexPen Commonly known as:  NOVOLOG Use 8 to 12 units before each meal as directed   Insulin Glargine 100 UNIT/ML Solostar Pen Commonly known as:  LANTUS Inject 40 Units into the skin daily at 10 pm.   Insulin Pen Needle 32G X 4 MM Misc Use daily with insulin 4 times a day   linaclotide 72 MCG capsule Commonly known as:  LINZESS 1 PO 30 mins WITH your first meal   lisinopril 2.5 MG tablet Commonly known as:  PRINIVIL,ZESTRIL TAKE ONE (1) TABLET BY MOUTH EVERY DAY   metoCLOPramide 5 MG tablet Commonly known as:  REGLAN TAKE ONE TO TWO TABLETS EVERY SIX HOURS AS NEEDED FOR NAUSEA OR VOMITING   mirtazapine 30 MG tablet Commonly known as:  REMERON Take 1 tablet by mouth daily at 8 pm.   montelukast 10 MG tablet Commonly known as:  SINGULAIR TAKE ONE TABLET BY MOUTH EVERY NIGHT AT BEDTIME.   omeprazole 40 MG capsule Commonly known as:  PRILOSEC TAKE ONE CAPSULE BY MOUTH 30 MINUTES PRIOR TO BREAKFAST AND SUPPER.   ondansetron 4 MG tablet Commonly known as:  ZOFRAN TAKE ONE TABLET BY MOUTH EVERY EIGHT HOURS AS NEEDED FOR NAUSEA OR VOMITING   ONE TOUCH ULTRA MINI w/Device Kit Use as directed   ONETOUCH DELICA LANCETS 48N Misc USE FOUR TIMES A DAY AS DIRECTED.   pravastatin 80 MG tablet Commonly known as:  PRAVACHOL TAKE ONE TABLET BY MOUTH IN THE EVENING   vitamin B-12 100 MCG tablet Commonly known as:   CYANOCOBALAMIN Take 1 tablet by mouth daily.        OBJECTIVE:   Vital Signs: BP 122/76 (BP Location: Left Arm)   Pulse 80   Ht '5\' 8"'  (1.727 m)   Wt 135 lb (61.2 kg)   LMP 07/26/2013   SpO2 96%   BMI 20.53 kg/m   Wt Readings from Last 3 Encounters:  09/11/18 135 lb (61.2 kg)  07/31/18 136 lb (61.7 kg)  06/02/18 135 lb (61.2 kg)     Exam: General: Pt appears  well and is in NAD  Hydration: Well-hydrated with moist mucous membranes and good skin turgor  HEENT: Head: Unremarkable with good dentition. Oropharynx clear without exudate.  Eyes: External eye exam normal without stare, lid lag or exophthalmos.  EOM intact.  PERRL.  Neck: General: Supple without adenopathy. Thyroid: Thyroid size normal.  No goiter or nodules appreciated. No thyroid bruit.  Lungs: Clear with good BS bilat with no rales, rhonchi, or wheezes  Heart: RRR with normal S1 and S2 and no gallops; no murmurs; no rub  Abdomen: Normoactive bowel sounds, soft, nontender, without masses or organomegaly palpable  Extremities: No pretibial edema. No tremor. Normal strength and motion throughout. See detailed diabetic foot exam below.  Skin: Normal texture and temperature to palpation. No rash noted. No Acanthosis nigricans/skin tags.   Neuro: MS is good with appropriate affect, pt is alert and Ox3     DATA REVIEWED:  Lab Results  Component Value Date   HGBA1C 8.9 (A) 07/31/2018   HGBA1C 8.9 (H) 02/12/2018   HGBA1C 8.3 (H) 10/18/2017   Lab Results  Component Value Date   MICROALBUR 6.1 (H) 07/31/2018   LDLCALC 106 (H) 02/12/2018   CREATININE 1.01 (H) 05/18/2018   Lab Results  Component Value Date   MICRALBCREAT 4.3 07/31/2018           ASSESSMENT / PLAN / RECOMMENDATIONS:   1) Type 1 Diabetes Mellitus, controlled, With complications - Most recent A1c of 8.9 %. Goal A1c < 7.0 %.   Plan:  - Poorly controlled diabetes is due to dietary indiscretions. We discussed pharmacokinetics of basal/bolus  insulin. I also explained to her that no matter how well she takes her insulin despite proper dosing, if she continues to eat between her meals with no prandial coverage, there will never be proper diabetes control and she is at risk for complications of diabetes such as blindness, ESRD and neuropathy.   - She is scheduled to see a dietician in the upcoming weeks.  - We discussed low carb snack options, if she must.  - May consider prandial coverage for snacks if she is unable to change her habits.  - We will not make any changes, but I will provide her with a written correctional scale, as she was confused about the last one that was provided to her. I emphasized , correctional insulin is to be used at meal times only and to avoid use for bedtime or for post-prandial BG's.  - Unfortunately she has been diagnosed with gastroparesis, which causes erratic glucose readings.  - She had given herself Novolog at times, 30 minutes after she has already finished a meals, patient advised to take Novolog half way through her meal , if she eats 50% of better, she may use the full dose of novolog. She was also advised that she could take Novolog towards the end of her meal, or immediately after the last bite, should she eat a fatty meal.  - Discussed effects of fat on glucose absorption.     MEDICATIONS: - Continue Lantus 30 units Twice a day - Continue Novolog 12 units with meals - Novolog correctional insulin: ADD extra units on insulin to your meal-time Novolog dose if your blood sugars are higher than . Use the scale below to help guide you:   Blood sugar before meal Number of units to inject  Less than 180 0 unit  180 -  209 1 units  210 -  239 2 units  240 -  269 3 units  270-  299 4 units  300 -  329 5 units  > 330 Call the physician    EDUCATION / INSTRUCTIONS:  BG monitoring instructions: Patient is instructed to check her blood sugars 4 times a day, before meals and at bedtime.  Call [facility]  Endocrinology clinic if: BG persistently < 70 or > 300. . I reviewed the Rule of 15 for the treatment of hypoglycemia in detail with the patient. Literature supplied.     Signed electronically by: Mack Guise, MD  [facility] Endocrinology  [facility] [address] [place], [place] [address] Phone: [phone] FAX: [phone]   CC: Fayrene Helper, MD [address] [place] [address] Phone: [phone]  Fax: [phone]  Return to Endocrinology clinic as below: Future Appointments  Date Time Provider [place]  09/15/2018 10:00 AM Carlis Stable, NP [facility]  09/15/2018  3:15 PM Landis Martins, DPM [facility]  10/05/2018  3:20 PM Fayrene Helper, MD [facility]  10/28/2018  1:00 PM Elayne Snare, MD [facility] None

## 2018-09-11 NOTE — Patient Instructions (Addendum)
-   Continue Lantus 30 units Twice a day - Continue Novolog 12 units with meals - Novolog correctional insulin: ADD extra units on insulin to your meal-time Novolog dose if your blood sugars are higher than . Use the scale below to help guide you:   Blood sugar before meal Number of units to inject  Less than 180 0 unit  180 -  209 1 units  210 -  239 2 units  240 -  269 3 units  270-  299 4 units  300 -  329 5 units  > 330 Call the physician     - Choose healthy, lower carb lower calorie snacks: toss salad, cooked vegetables, cottage cheese, peanut butter, low fat cheese / string cheese, lower sodium deli meat, tuna salad or chicken salad    HOW TO TREAT LOW BLOOD SUGARS (Blood sugar LESS THAN 70 MG/DL)  Please follow the RULE OF 15 for the treatment of hypoglycemia treatment (when your (blood sugars are less than 70 mg/dL)    STEP 1: Take 15 grams of carbohydrates when your blood sugar is low, which includes:   3-4 GLUCOSE TABS  OR  3-4 OZ OF JUICE OR REGULAR SODA OR  ONE TUBE OF GLUCOSE GEL     STEP 2: RECHECK blood sugar in 15 MINUTES STEP 3: If your blood sugar is still low at the 15 minute recheck --> then, go back to STEP 1 and treat AGAIN with another 15 grams of carbohydrates.

## 2018-09-14 ENCOUNTER — Telehealth: Payer: Self-pay | Admitting: Endocrinology

## 2018-09-14 NOTE — Telephone Encounter (Signed)
[place] ph# [phone] called re: please call pharmacy to clarify the directions on the RX for Novalog Flex Pen-directions are different on this mornings escript.

## 2018-09-14 NOTE — Telephone Encounter (Signed)
Spoke with pharmacy and she will change the signature for Novolog to read max 12 units daily with each meal and use sliding scale for extra insulin

## 2018-09-15 ENCOUNTER — Ambulatory Visit: Payer: PPO | Admitting: Sports Medicine

## 2018-09-15 ENCOUNTER — Encounter: Payer: Self-pay | Admitting: Sports Medicine

## 2018-09-15 ENCOUNTER — Encounter: Payer: Self-pay | Admitting: Nurse Practitioner

## 2018-09-15 ENCOUNTER — Ambulatory Visit (INDEPENDENT_AMBULATORY_CARE_PROVIDER_SITE_OTHER): Payer: PPO | Admitting: Nurse Practitioner

## 2018-09-15 VITALS — BP 117/79 | HR 89 | Temp 97.0°F | Ht 68.0 in | Wt 133.4 lb

## 2018-09-15 DIAGNOSIS — M79671 Pain in right foot: Secondary | ICD-10-CM

## 2018-09-15 DIAGNOSIS — K5901 Slow transit constipation: Secondary | ICD-10-CM | POA: Diagnosis not present

## 2018-09-15 DIAGNOSIS — R1032 Left lower quadrant pain: Secondary | ICD-10-CM

## 2018-09-15 DIAGNOSIS — K3184 Gastroparesis: Secondary | ICD-10-CM

## 2018-09-15 DIAGNOSIS — M79672 Pain in left foot: Secondary | ICD-10-CM

## 2018-09-15 DIAGNOSIS — B351 Tinea unguium: Secondary | ICD-10-CM

## 2018-09-15 DIAGNOSIS — E1142 Type 2 diabetes mellitus with diabetic polyneuropathy: Secondary | ICD-10-CM | POA: Diagnosis not present

## 2018-09-15 DIAGNOSIS — K219 Gastro-esophageal reflux disease without esophagitis: Secondary | ICD-10-CM

## 2018-09-15 DIAGNOSIS — R109 Unspecified abdominal pain: Secondary | ICD-10-CM | POA: Insufficient documentation

## 2018-09-15 NOTE — Assessment & Plan Note (Signed)
Gastroparesis and notes persistent intermittent bloating.  States she tries to follow a gastroparesis diet but does not follow at 100% and "I tend to pay for it if I do not follow it."  Bloating is only intermittent, likely due to nonadherence to gastroparesis diet.  Recommend she adhere to this as much as possible.  Return for follow-up in 4 months.

## 2018-09-15 NOTE — Assessment & Plan Note (Signed)
Noted constipation and feels that Foley worked too well and causes diarrhea.  We will start her on Amitiza 8 mcg twice daily and request a progress report in 1 week with 1 week of samples provided.  Follow-up in 4 months.  Follows a pattern of irritable bowel syndrome constipation type.  She does occasionally have diarrhea, but finds this typically occurs after having significant constipation and eventual constipated stool.

## 2018-09-15 NOTE — Assessment & Plan Note (Signed)
The patient describes left lower quadrant abdominal pain in the setting of constipation.  When she has a good bowel movement her pain improves.  Linzess worked too well for her.  She is requesting another option.  I will start her on Amitiza 8 mcg twice daily on a full stomach.  We will provide samples for 1 week and request a progress report at that time.  Further recommendations to follow.  Follow-up in 4 months.

## 2018-09-15 NOTE — Progress Notes (Signed)
Patient ID: Ashley Armstrong, female   DOB: 01/31/1966, 52 y.o.   MRN: [id number] Subjective: Ashley Armstrong is a 52 y.o. female patient with history of diabetes who presents to office today complaining of long, painful nails  while ambulating in shoes; unable to trim. Patient states that the glucose reading this morning was not recorded, reports that she has been away helping care for her father and missed her last appointment. Otherwise is doing good.   Patient Active Problem List   Diagnosis Date Noted  . Abdominal pain 09/15/2018  . Diabetic gastroparesis associated with type 1 diabetes mellitus (HCC) 08/02/2018  . Diabetic peripheral neuropathy associated with type 1 diabetes mellitus (HCC) 08/02/2018  . Change in stool habits 06/05/2018  . Gallbladder polyp 12/13/2016  . GERD (gastroesophageal reflux disease) 09/05/2016  . Constipation 10/12/2015  . Seasonal allergies 11/09/2014  . Depression with anxiety 06/18/2014  . Need for vaccination for Strep pneumoniae 06/18/2014  . Tight fascia 01/17/2014  . Degenerative disc disease, cervical 12/07/2013  . Annual physical exam 07/31/2013  . CTS (carpal tunnel syndrome) 07/08/2013  . Polyneuropathy 07/08/2013  . Essential hypertension, benign 07/04/2013  . Diabetic nephropathy (HCC) 06/23/2013  . Tennis elbow syndrome 04/15/2013  . Rotator cuff syndrome of left shoulder 04/15/2013  . Vitamin D deficiency 03/20/2013  . Onychomycosis of toenail 03/20/2013  . Diabetes mellitus, insulin dependent (IDDM), uncontrolled (HCC) 10/24/2008  . Gastroparesis 06/16/2008  . Mixed hyperlipidemia 04/13/2008  . Generalized anxiety disorder 04/13/2008   Current Outpatient Medications on File Prior to Visit  Medication Sig Dispense Refill  . Blood Glucose Monitoring Suppl (ONE TOUCH ULTRA MINI) w/Device KIT Use as directed 1 each 0  . busPIRone (BUSPAR) 10 MG tablet Take 5 mg by mouth 2 (two) times daily.    . calcium-vitamin D (OSCAL WITH D) 500-200  MG-UNIT TABS tablet Take 1 tablet by mouth daily.    . Continuous Blood Gluc Receiver (FREESTYLE LIBRE 14 DAY READER) DEVI     . Continuous Blood Gluc Sensor (FREESTYLE LIBRE 14 DAY SENSOR) MISC 1 Units by Does not apply route every 14 (fourteen) days. 2 each 4  . diazepam (VALIUM) 10 MG tablet Take 10 mg by mouth daily as needed for anxiety.     . DUREZOL 0.05 % EMUL Apply 1 drop to eye daily at 8 pm.    . glucose blood (ONE TOUCH ULTRA TEST) test strip TEST FOUR TIMES A DAY AS DIRECTED. 100 each 11  . insulin aspart (NOVOLOG FLEXPEN) 100 UNIT/ML FlexPen Use 8 to 12 units before each meal as directed 15 mL 1  . Insulin Glargine (LANTUS SOLOSTAR) 100 UNIT/ML Solostar Pen Inject 40 Units into the skin daily at 10 pm. 15 mL 2  . Insulin Pen Needle (SURE COMFORT PEN NEEDLES) 32G X 4 MM MISC Use daily with insulin 4 times a day 400 each 1  . lisinopril (PRINIVIL,ZESTRIL) 2.5 MG tablet TAKE ONE (1) TABLET BY MOUTH EVERY DAY 30 tablet 5  . metoCLOPramide (REGLAN) 5 MG tablet TAKE ONE TO TWO TABLETS EVERY SIX HOURS AS NEEDED FOR NAUSEA OR VOMITING 60 tablet 3  . mirtazapine (REMERON) 30 MG tablet Take 1 tablet by mouth daily at 8 pm.    . montelukast (SINGULAIR) 10 MG tablet TAKE ONE TABLET BY MOUTH EVERY NIGHT AT BEDTIME. 90 tablet 1  . Omega-3 Fatty Acids (FISH OIL) 1000 MG CAPS Take 1 capsule by mouth daily.    . omeprazole (PRILOSEC) 40 MG capsule TAKE   ONE CAPSULE BY MOUTH 30 MINUTES PRIOR TO BREAKFAST AND SUPPER. 180 capsule 3  . ondansetron (ZOFRAN) 4 MG tablet TAKE ONE TABLET BY MOUTH EVERY EIGHT HOURS AS NEEDED FOR NAUSEA OR VOMITING 60 tablet 3  . ONETOUCH DELICA LANCETS 40N MISC USE FOUR TIMES A DAY AS DIRECTED. 100 each 1  . pravastatin (PRAVACHOL) 80 MG tablet TAKE ONE TABLET BY MOUTH IN THE EVENING 90 tablet 2  . vitamin B-12 (CYANOCOBALAMIN) 100 MCG tablet Take 1 tablet by mouth daily.     No current facility-administered medications on file prior to visit.    No Known  Allergies  Recent Results (from the past 2160 hour(s))  HgB A1c     Status: Abnormal   Collection Time: 07/31/18 10:45 AM  Result Value Ref Range   Hemoglobin A1C 8.9 (A) 4.0 - 5.6 %   HbA1c POC (<> result, manual entry)     HbA1c, POC (prediabetic range)     HbA1c, POC (controlled diabetic range)    Urinalysis, Routine w reflex microscopic     Status: Abnormal   Collection Time: 07/31/18 11:59 AM  Result Value Ref Range   Color, Urine YELLOW Yellow;Lt. Yellow   APPearance CLEAR Clear   Specific Gravity, Urine 1.015 1.000 - 1.030   pH 6.0 5.0 - 8.0   Total Protein, Urine NEGATIVE Negative   Urine Glucose NEGATIVE Negative   Ketones, ur NEGATIVE Negative   Bilirubin Urine NEGATIVE Negative   Hgb urine dipstick NEGATIVE Negative   Urobilinogen, UA 0.2 0.0 - 1.0   Leukocytes, UA NEGATIVE Negative   Nitrite NEGATIVE Negative   WBC, UA 0-2/hpf 0-2/hpf   RBC / HPF none seen 0-2/hpf   Squamous Epithelial / LPF Rare(0-4/hpf) Rare(0-4/hpf)   Bacteria, UA Rare(<10/hpf) (A) None  Microalbumin / creatinine urine ratio     Status: Abnormal   Collection Time: 07/31/18 11:59 AM  Result Value Ref Range   Microalb, Ur 6.1 (H) 0.0 - 1.9 mg/dL   Creatinine,U 143.7 mg/dL   Microalb Creat Ratio 4.3 0.0 - 30.0 mg/g    Objective: General: Patient is awake, alert, and oriented x 3 and in no acute distress.  Integument: Skin is warm, dry and supple bilateral. Nails are tender, long, thickened and  dystrophic with subungual debris, consistent with onychomycosis, 1-5 bilateral. No signs of infection. No open lesions or preulcerative lesions present bilateral. Remaining integument unremarkable.  Vasculature:  Dorsalis Pedis pulse 1/4 bilateral. Posterior Tibial pulse 1/4 bilateral.  Capillary fill time <3 sec 1-5 bilateral. Decreased hair growth to the level of the digits. Temperature gradient within normal limits. Mild varicosities present bilateral. No edema present bilateral.   Neurology: The  patient has absent sensation measured with a 5.07/10g Semmes Weinstein Monofilament at all pedal sites bilateral . Vibratory sensation absent bilateral with tuning fork. No Babinski sign present bilateral.   Musculoskeletal: Asymptomatic varus 4-5 hammertoes pedal deformities noted bilateral. Muscular strength 4/5 in all lower extremity muscular groups bilateral without pain on range of motion . No tenderness with calf compression bilateral.  Assessment and Plan: Problem List Items Addressed This Visit    None    Visit Diagnoses    Dermatophytosis of nail    -  Primary   Diabetic polyneuropathy associated with type 2 diabetes mellitus (HCC)       Foot pain, bilateral          -Examined patient. -Discussed and educated patient on diabetic foot care, especially with  regards to the vascular,  neurological and musculoskeletal systems.  -Mechanically debrided all nails 1-5 bilateral using sterile nail nipper and filed with dremel without incident  -Answered all patient questions -Patient to return  in 3 months for at risk foot care -Patient advised to call the office if any problems or questions arise in the meantime.  Titorya Stover, DPM   

## 2018-09-15 NOTE — Progress Notes (Signed)
Referring Provider: Fayrene Helper, MD Primary Care Physician:  Fayrene Helper, MD Primary GI:  Dr.   Chief Complaint  Patient presents with  . Abdominal Pain    left side  . Constipation  . Diarrhea    HPI:   Ashley Armstrong is a 52 y.o. female who presents for abdominal pain, constipation, diarrhea.  The patient was last seen in our office 03/26/2017 for gastroparesis, slow transit constipation, GERD.  At that time GERD symptoms well controlled, noted bloating, increased flatulence, follows gastroparesis diet most of the time.  Recommend he continue omeprazole twice daily, Linzess 145 mcg versus 72 mcg depending on stool softeners.  Follow-up in 4 months.  Right upper quadrant ultrasound due to polyp was performed 12/25/2017 and found common bile duct with normal diameter, 2 mm polyp consistent with no change and noted polyps less than 10 mm do not require specific follow-up.  Normal liver.  Today she states she's doing ok overall. She is having LLQ abdominal discomfort described as sharp, intermittent. Linzess worked "too well". Has constipation and diarrhea, although she finds she typically has diarrhea after passing a constipated stool. Denies other abdominal pain, hematochezia, melena, vomiting. Has intermittent nausea. Does see come scant toilet tissue hematochezia after constipation (colonoscopy up to date 2017 and due 2027).  Has intermittent upper abdominal discomfort, still on PPI. Also with intermittent bloating, tries to follow gastroparesis diet but admits she doesn't follow it 100% and "I pay for it when I don't follow it." GERD doing well overall. Denies chest pain, dyspnea, dizziness, lightheadedness, syncope, near syncope. Denies any other upper or lower GI symptoms.  Past Medical History:  Diagnosis Date  . Anxiety   . Dyslipidemia   . Gastroparesis due to DM ([place]) OCT 2014   75% AT 2 HRS, GLU >   . H/O eye surgery 01/2018  . Headache(784.0)   . IDDM (insulin  dependent diabetes mellitus) ([place])   . Neuropathy   . Nicotine addiction   . TIA (transient ischemic attack)    Dr. Merlene Laughter    Past Surgical History:  Procedure Laterality Date  . Cataract surgery Bilateral 01/20/2017, 02/03/2017  . COLONOSCOPY N/A 05/09/2016   Procedure: COLONOSCOPY;  Surgeon: Danie Binder, MD;  Location: [facility];  Service: Endoscopy;  Laterality: N/A;  130  . ESOPHAGOGASTRODUODENOSCOPY N/A 08/31/2013   SLF: 1. Earky satiety nausea may be due to Gastroparesis/pyloric channel stenosis. 2. small hiatal hernia 3. Moderate erosive gastritis.   Marland Kitchen EYE SURGERY  01/2018  . laser surgery bilateral eye      Current Outpatient Medications  Medication Sig Dispense Refill  . Blood Glucose Monitoring Suppl (ONE TOUCH ULTRA MINI) w/Device KIT Use as directed 1 each 0  . busPIRone (BUSPAR) 10 MG tablet Take 5 mg by mouth 2 (two) times daily.    . calcium-vitamin D (OSCAL WITH D) 500-200 MG-UNIT TABS tablet Take 1 tablet by mouth daily.    . Continuous Blood Gluc Sensor (FREESTYLE LIBRE 14 DAY SENSOR) MISC 1 Units by Does not apply route every 14 (fourteen) days. 2 each 4  . diazepam (VALIUM) 10 MG tablet Take 10 mg by mouth daily as needed for anxiety.     . DUREZOL 0.05 % EMUL Apply 1 drop to eye daily at 8 pm.    . glucose blood (ONE TOUCH ULTRA TEST) test strip TEST FOUR TIMES A DAY AS DIRECTED. 100 each 11  . insulin aspart (NOVOLOG FLEXPEN) 100 UNIT/ML  FlexPen Use 8 to 12 units before each meal as directed 15 mL 1  . Insulin Glargine (LANTUS SOLOSTAR) 100 UNIT/ML Solostar Pen Inject 40 Units into the skin daily at 10 pm. 15 mL 2  . Insulin Pen Needle (SURE COMFORT PEN NEEDLES) 32G X 4 MM MISC Use daily with insulin 4 times a day 400 each 1  . lisinopril (PRINIVIL,ZESTRIL) 2.5 MG tablet TAKE ONE (1) TABLET BY MOUTH EVERY DAY 30 tablet 5  . metoCLOPramide (REGLAN) 5 MG tablet TAKE ONE TO TWO TABLETS EVERY SIX HOURS AS NEEDED FOR NAUSEA OR VOMITING 60 tablet 3  . mirtazapine  (REMERON) 30 MG tablet Take 1 tablet by mouth daily at 8 pm.    . montelukast (SINGULAIR) 10 MG tablet TAKE ONE TABLET BY MOUTH EVERY NIGHT AT BEDTIME. 90 tablet 1  . Omega-3 Fatty Acids (FISH OIL) 1000 MG CAPS Take 1 capsule by mouth daily.    Marland Kitchen omeprazole (PRILOSEC) 40 MG capsule TAKE ONE CAPSULE BY MOUTH 30 MINUTES PRIOR TO BREAKFAST AND SUPPER. 180 capsule 3  . ondansetron (ZOFRAN) 4 MG tablet TAKE ONE TABLET BY MOUTH EVERY EIGHT HOURS AS NEEDED FOR NAUSEA OR VOMITING 60 tablet 3  . ONETOUCH DELICA LANCETS 41D MISC USE FOUR TIMES A DAY AS DIRECTED. 100 each 1  . pravastatin (PRAVACHOL) 80 MG tablet TAKE ONE TABLET BY MOUTH IN THE EVENING 90 tablet 2  . vitamin B-12 (CYANOCOBALAMIN) 100 MCG tablet Take 1 tablet by mouth daily.     No current facility-administered medications for this visit.     Allergies as of 09/15/2018  . (No Known Allergies)    Family History  Problem Relation Age of Onset  . Hypertension Mother   . Diabetes Mother   . Hyperlipidemia Mother   . Rosacea Mother   . Hypertension Father   . Diabetes Brother   . Breast cancer Maternal Aunt   . Colon cancer Neg Hx     Social History   Socioeconomic History  . Marital status: Single    Spouse name: Not on file  . Number of children: Not on file  . Years of education: Not on file  . Highest education level: Not on file  Occupational History  . Occupation: disabled  Social Needs  . Financial resource strain: Not very hard  . Food insecurity:    Worry: Sometimes true    Inability: Sometimes true  . Transportation needs:    Medical: No    Non-medical: No  Tobacco Use  . Smoking status: Former Smoker    Packs/day: 1.50    Years: 24.00    Pack years: 36.00    Types: Cigarettes    Last attempt to quit: 08/24/2011    Years since quitting: 7.0  . Smokeless tobacco: Never Used  Substance and Sexual Activity  . Alcohol use: No  . Drug use: No  . Sexual activity: Not Currently    Birth  control/protection: None  Lifestyle  . Physical activity:    Days per week: 2 days    Minutes per session: 20 min  . Stress: Very much  Relationships  . Social connections:    Talks on phone: More than three times a week    Gets together: More than three times a week    Attends religious service: More than 4 times per year    Active member of club or organization: No    Attends meetings of clubs or organizations: Never    Relationship status:  Never married  Other Topics Concern  . Not on file  Social History Narrative   Disabled, lives with a room mate    Review of Systems: General: Negative for anorexia, weight loss, fever, chills, fatigue, weakness. Eyes: Negative for vision changes.  ENT: Negative for hoarseness, difficulty swallowing , nasal congestion. CV: Negative for chest pain, angina, palpitations, dyspnea on exertion, peripheral edema.  Respiratory: Negative for dyspnea at rest, dyspnea on exertion, cough, sputum, wheezing.  GI: See history of present illness. GU:  Negative for dysuria, hematuria, urinary incontinence, urinary frequency, nocturnal urination.  MS: Negative for joint pain, low back pain.  Derm: Negative for rash or itching.  Neuro: Negative for weakness, abnormal sensation, seizure, frequent headaches, memory loss, confusion.  Psych: Negative for anxiety, depression, suicidal ideation, hallucinations.  Endo: Negative for unusual weight change.  Heme: Negative for bruising or bleeding. Allergy: Negative for rash or hives.   Physical Exam: BP 117/79   Pulse 89   Temp (!) 97 F (36.1 C) (Oral)   Ht 5' 8" (1.727 m)   Wt 133 lb 6.4 oz (60.5 kg)   LMP 07/26/2013   BMI 20.28 kg/m  General:   Alert and oriented. Pleasant and cooperative. Well-nourished and well-developed.  Head:  Normocephalic and atraumatic. Eyes:  Without icterus, sclera clear and conjunctiva pink.  Ears:  Normal auditory acuity. Mouth:  No deformity or lesions, oral mucosa pink.    Throat/Neck:  Supple, without mass or thyromegaly. Cardiovascular:  S1, S2 present without murmurs appreciated. Normal pulses noted. Extremities without clubbing or edema. Respiratory:  Clear to auscultation bilaterally. No wheezes, rales, or rhonchi. No distress.  Gastrointestinal:  +BS, soft, non-tender and non-distended. No HSM noted. No guarding or rebound. No masses appreciated.  Rectal:  Deferred  Musculoskalatal:  Symmetrical without gross deformities. Normal posture. Skin:  Intact without significant lesions or rashes. Neurologic:  Alert and oriented x4;  grossly normal neurologically. Psych:  Alert and cooperative. Normal mood and affect. Heme/Lymph/Immune: No significant cervical adenopathy. No excessive bruising noted.    09/15/2018 10:20 AM   Disclaimer: This note was dictated with voice recognition software. Similar sounding words can inadvertently be transcribed and may not be corrected upon review.

## 2018-09-15 NOTE — Assessment & Plan Note (Signed)
Symptoms generally well controlled on PPI.  Recommend she continue her current medications and follow-up in 4 months.

## 2018-09-15 NOTE — Progress Notes (Signed)
cc'ed to pcp °

## 2018-09-15 NOTE — Patient Instructions (Signed)
1. I am giving you samples of Amitiza 8 mcg.  Take this twice a day, on a full stomach/after meal. 2. Call Ashley Armstrong in 1 week and let Ashley Armstrong know if it is helping her constipation and working well for you. 3. Return for follow-up in 4 months. 4. Call Ashley Armstrong if you have any questions or concerns.  At [facility] we value your feedback. You may receive a survey about your visit today. Please share your experience as we strive to create trusting relationships with our patients to provide genuine, compassionate, quality care.  We appreciate your understanding and patience as we review any laboratory studies, imaging, and other diagnostic tests that are ordered as we care for you. Our office policy is 5 business days for review of these results, and any emergent or urgent results are addressed in a timely manner for your best interest. If you do not hear from our office in 1 week, please contact Ashley Armstrong.   We also encourage the use of MyChart, which contains your medical information for your review as well. If you are not enrolled in this feature, an access code is on this after visit summary for your convenience. Thank you for allowing Ashley Armstrong to be involved in your care.  It was great to meet you today!  I hope you have a great Fall!!

## 2018-09-23 DIAGNOSIS — E103592 Type 1 diabetes mellitus with proliferative diabetic retinopathy without macular edema, left eye: Secondary | ICD-10-CM | POA: Diagnosis not present

## 2018-09-23 DIAGNOSIS — H43391 Other vitreous opacities, right eye: Secondary | ICD-10-CM | POA: Diagnosis not present

## 2018-09-23 DIAGNOSIS — E103511 Type 1 diabetes mellitus with proliferative diabetic retinopathy with macular edema, right eye: Secondary | ICD-10-CM | POA: Diagnosis not present

## 2018-09-23 DIAGNOSIS — H35371 Puckering of macula, right eye: Secondary | ICD-10-CM | POA: Diagnosis not present

## 2018-09-24 ENCOUNTER — Other Ambulatory Visit: Payer: Self-pay | Admitting: Endocrinology

## 2018-10-05 ENCOUNTER — Ambulatory Visit (INDEPENDENT_AMBULATORY_CARE_PROVIDER_SITE_OTHER): Payer: PPO | Admitting: Family Medicine

## 2018-10-05 ENCOUNTER — Encounter: Payer: Self-pay | Admitting: Family Medicine

## 2018-10-05 VITALS — BP 110/60 | HR 81 | Resp 12 | Ht 68.0 in | Wt 135.0 lb

## 2018-10-05 DIAGNOSIS — E785 Hyperlipidemia, unspecified: Secondary | ICD-10-CM

## 2018-10-05 DIAGNOSIS — E1165 Type 2 diabetes mellitus with hyperglycemia: Secondary | ICD-10-CM

## 2018-10-05 DIAGNOSIS — Z794 Long term (current) use of insulin: Secondary | ICD-10-CM

## 2018-10-05 DIAGNOSIS — IMO0001 Reserved for inherently not codable concepts without codable children: Secondary | ICD-10-CM

## 2018-10-05 DIAGNOSIS — Z23 Encounter for immunization: Secondary | ICD-10-CM | POA: Diagnosis not present

## 2018-10-05 NOTE — Patient Instructions (Signed)
F/U in 5 months, call if you need me before  Labs today we will mail appointment  Flu Vaccine today

## 2018-10-07 ENCOUNTER — Encounter: Payer: Self-pay | Admitting: Family Medicine

## 2018-10-07 NOTE — Assessment & Plan Note (Signed)
After obtaining informed consent, the vaccine is  administered by LPN.  

## 2018-10-07 NOTE — Assessment & Plan Note (Signed)
[name] is reminded of the importance of commitment to daily physical activity for 30 minutes or more, as able and the need to limit carbohydrate intake to 30 to 60 grams per meal to help with blood sugar control.   The need to take medication as prescribed, test blood sugar as directed, and to call between visits if there is a concern that blood sugar is uncontrolled is also discussed.   [name] is reminded of the importance of daily foot exam, annual eye examination, and good blood sugar, blood pressure and cholesterol control. Improving though still not at goal, managed by endo  Diabetic Labs Latest Ref Rng & Units 07/31/2018 05/18/2018 02/12/2018 10/18/2017 07/17/2017  HbA1c 4.0 - 5.6 % 8.9(A) - 8.9(H) 8.3(H) 8.7(H)  Microalbumin 0.0 - 1.9 mg/dL 6.1(H) - - - -  Micro/Creat Ratio 0.0 - 30.0 mg/g 4.3 - - - -  Chol <200 mg/dL - - 197 - -  HDL >50 mg/dL - - 72 - -  Calc LDL mg/dL (calc) - - 106(H) - -  Triglycerides <150 mg/dL - - 92 - -  Creatinine 0.44 - 1.00 mg/dL - 1.01(H) 1.14(H) 1.28(H) -   BP/Weight 10/05/2018 09/15/2018 09/11/2018 07/31/2018 06/02/2018 05/18/2018 5/36/4680  Systolic BP 321 224 825 003 704 888 916  Diastolic BP 60 79 76 80 86 81 80  Wt. (Lbs) 135 133.4 135 136 135 135 135  BMI 20.53 20.28 20.53 20.68 20.53 20.53 20.53   Foot/eye exam completion dates Latest Ref Rng & Units 07/31/2018 06/02/2018  Eye Exam No Retinopathy - -  Foot Form Completion - Done Done

## 2018-10-07 NOTE — Assessment & Plan Note (Signed)
Hyperlipidemia:Low fat diet discussed and encouraged.   Lipid Panel  Lab Results  Component Value Date   CHOL 197 02/12/2018   HDL 72 02/12/2018   LDLCALC 106 (H) 02/12/2018   TRIG 92 02/12/2018   CHOLHDL 2.7 02/12/2018  Updated lab needed

## 2018-10-07 NOTE — Progress Notes (Signed)
Ashley Armstrong     MRN: [id number]      DOB: 1966/03/18   HPI [name] is here for follow up and re-evaluation of chronic medical conditions, medication management and review of any available recent lab and radiology data.  Preventive health is updated, specifically  Cancer screening and Immunization.   Reports improvement in blood sugar  Denies polyuria, polydipsia, blurred vision , or hypoglycemic episodes.  The PT denies any adverse reactions to current medications since the last visit.  There are no new concerns.  There are no specific complaints   ROS Denies recent fever or chills. Denies sinus pressure, nasal congestion, ear pain or sore throat. Denies chest congestion, productive cough or wheezing. Denies chest pains, palpitations and leg swelling Denies abdominal pain, nausea, vomiting,diarrhea or constipation.   Denies dysuria, frequency, hesitancy or incontinence. Denies joint pain, swelling and limitation in mobility. Denies headaches, seizures, numbness, or tingling. Denies depression, anxiety or insomnia. Denies skin break down or rash.   PE  BP 110/60 (BP Location: Left Arm, Patient Position: Sitting, Cuff Size: Normal)   Pulse 81   Resp 12   Ht 5\' 8"  (1.727 m)   Wt 135 lb (61.2 kg)   LMP 07/26/2013   SpO2 96% Comment: room air  BMI 20.53 kg/m   Patient alert and oriented and in no cardiopulmonary distress.  HEENT: No facial asymmetry, EOMI,   oropharynx pink and moist.  Neck supple no JVD, no mass.  Chest: Clear to auscultation bilaterally.  CVS: S1, S2 no murmurs, no S3.Regular rate.  ABD: Soft non tender.   Ext: No edema  MS: Adequate ROM spine, shoulders, hips and knees.  Skin: Intact, no ulcerations or rash noted.  Psych: Good eye contact, normal affect. Memory intact not anxious or depressed appearing.  CNS: CN 2-12 intact, power,  normal throughout.no focal deficits noted.   Assessment & Plan  Diabetes mellitus, insulin dependent  (IDDM), uncontrolled ([place]) [name] is reminded of the importance of commitment to daily physical activity for 30 minutes or more, as able and the need to limit carbohydrate intake to 30 to 60 grams per meal to help with blood sugar control.   The need to take medication as prescribed, test blood sugar as directed, and to call between visits if there is a concern that blood sugar is uncontrolled is also discussed.   [name] is reminded of the importance of daily foot exam, annual eye examination, and good blood sugar, blood pressure and cholesterol control. Improving though still not at goal, managed by endo  Diabetic Labs Latest Ref Rng & Units 07/31/2018 05/18/2018 02/12/2018 10/18/2017 07/17/2017  HbA1c 4.0 - 5.6 % 8.9(A) - 8.9(H) 8.3(H) 8.7(H)  Microalbumin 0.0 - 1.9 mg/dL 6.1(H) - - - -  Micro/Creat Ratio 0.0 - 30.0 mg/g 4.3 - - - -  Chol <200 mg/dL - - 197 - -  HDL >50 mg/dL - - 72 - -  Calc LDL mg/dL (calc) - - 106(H) - -  Triglycerides <150 mg/dL - - 92 - -  Creatinine 0.44 - 1.00 mg/dL - 1.01(H) 1.14(H) 1.28(H) -   BP/Weight 10/05/2018 09/15/2018 09/11/2018 07/31/2018 06/02/2018 05/18/2018 02/17/8118  Systolic BP 147 829 562 130 865 784 696  Diastolic BP 60 79 76 80 86 81 80  Wt. (Lbs) 135 133.4 135 136 135 135 135  BMI 20.53 20.28 20.53 20.68 20.53 20.53 20.53   Foot/eye exam completion dates Latest Ref Rng & Units 07/31/2018 06/02/2018  Eye Exam No Retinopathy - -  Foot Form Completion - Done Done         Hyperlipidemia LDL goal <100 Hyperlipidemia:Low fat diet discussed and encouraged.   Lipid Panel  Lab Results  Component Value Date   CHOL 197 02/12/2018   HDL 72 02/12/2018   LDLCALC 106 (H) 02/12/2018   TRIG 92 02/12/2018   CHOLHDL 2.7 02/12/2018  Updated lab needed       Need for immunization against influenza After obtaining informed consent, the vaccine is  administered by LPN.

## 2018-10-12 ENCOUNTER — Other Ambulatory Visit: Payer: Self-pay | Admitting: Family Medicine

## 2018-10-12 DIAGNOSIS — F329 Major depressive disorder, single episode, unspecified: Secondary | ICD-10-CM | POA: Diagnosis not present

## 2018-10-27 NOTE — Progress Notes (Deleted)
Patient ID: Ashley Armstrong, female   DOB: 11/14/1965, 52 y.o.   MRN: [id number]          Reason for Appointment: Consultation for Type 1 Diabetes  Referring physician: Dr. [name]   History of Present Illness:          Date of diagnosis of type 1 diabetes mellitus: Age 39       Background history:  She has been on insulin practically since her diagnosis was made.  Initially she had symptoms of weight loss and blurred vision For several years has been on Lantus and NovoLog but usually has had poor control with A1c mostly between 8-10%  Recent history:   Most recent A1c is 8.9 done in 9/19   INSULIN regimen is:  Lantus once a day, 60 units at 3 pm.  NovoLog mostly 10-12 units before meals       Current management, blood sugar patterns and problems identified:  She is taking fixed dose of NovoLog at mealtimes and usually taking it right when she is eating a meal  She is reportedly using a sliding scale but has a basal amount of 10 units, appears to be mostly taking 10 units unless blood sugar is over 200 when she takes 12 units  She has been told to take variable amounts of Lantus by her various physicians but she thinks 60 units works better and not clear why she takes it in the afternoon  She has variable readings FASTING but recently averaging only about 150  Blood sugars are mostly variable at lunchtime but more consistently higher at suppertime and bedtime   Occasionally will feel hypoglycemic between 10-11 AM but usually not otherwise and rarely overnight  She thinks that she is probably having high readings because of usually not planning her meals well, eating a lot of frozen foods and relatively high fat meals, she may have done better previously when she was trying to eat more healthy plan meals  She is always been on fingersticks with a glucose monitor and mostly using One Touch recently, has not been advised the CGM for monitoring  Usually rotating her  injection sites although not using her abdomen           Meal times are:  Breakfast is 7 AM at Lunch: 12 PM dinner: 5 PM  Typical meal intake: Breakfast is frequently high fat but otherwise eggs and toast.  Lunch usually a mixed meal and evening meal may be a sandwich with bland food.  Snacks will be cheese and crackers Usually avoiding drinks with sugar Eating out only about once a week at fast food restaurants               Exercise: None    Glucose monitoring:  done 4 times a day         Glucometer: One Touch.       Blood Glucose readings by time of day and averages from record    PREMEAL Breakfast Lunch Dinner Bedtime  Overall   Glucose range:  70-199  112-298  184-238  118-264   Median:        POST-MEAL PC Breakfast PC Lunch PC Dinner  Glucose range:     Median:       Dietician visit, most recent: 11/18  Weight history:  Wt Readings from Last 3 Encounters:  10/05/18 135 lb (61.2 kg)  09/15/18 133 lb 6.4 oz (60.5 kg)  09/11/18 135 lb (61.2 kg)  Glycemic control:   Lab Results  Component Value Date   HGBA1C 8.9 (A) 07/31/2018   HGBA1C 8.9 (H) 02/12/2018   HGBA1C 8.3 (H) 10/18/2017   Lab Results  Component Value Date   MICROALBUR 6.1 (H) 07/31/2018   LDLCALC 106 (H) 02/12/2018   CREATININE 1.01 (H) 05/18/2018   Lab Results  Component Value Date   MICRALBCREAT 4.3 07/31/2018    No results found for: FRUCTOSAMINE  No visits with results within 1 Week(s) from this visit.  Latest known visit with results is:  Office Visit on 07/31/2018  Component Date Value Ref Range Status  . Hemoglobin A1C 07/31/2018 8.9* 4.0 - 5.6 % Final  . Color, Urine 07/31/2018 YELLOW  Yellow;Lt. Yellow Final  . APPearance 07/31/2018 CLEAR  Clear Final  . Specific Gravity, Urine 07/31/2018 1.015  1.000 - 1.030 Final  . pH 07/31/2018 6.0  5.0 - 8.0 Final  . Total Protein, Urine 07/31/2018 NEGATIVE  Negative Final  . Urine Glucose 07/31/2018 NEGATIVE  Negative Final  . Ketones,  ur 07/31/2018 NEGATIVE  Negative Final  . Bilirubin Urine 07/31/2018 NEGATIVE  Negative Final  . Hgb urine dipstick 07/31/2018 NEGATIVE  Negative Final  . Urobilinogen, UA 07/31/2018 0.2  0.0 - 1.0 Final  . Leukocytes, UA 07/31/2018 NEGATIVE  Negative Final  . Nitrite 07/31/2018 NEGATIVE  Negative Final  . WBC, UA 07/31/2018 0-2/hpf  0-2/hpf Final  . RBC / HPF 07/31/2018 none seen  0-2/hpf Final  . Squamous Epithelial / LPF 07/31/2018 Rare(0-4/hpf)  Rare(0-4/hpf) Final  . Bacteria, UA 07/31/2018 Rare(<10/hpf)* None Final  . Microalb, Ur 07/31/2018 6.1* 0.0 - 1.9 mg/dL Final  . Creatinine,U 07/31/2018 143.7  mg/dL Final  . Microalb Creat Ratio 07/31/2018 4.3  0.0 - 30.0 mg/g Final    Allergies as of 10/28/2018   No Known Allergies     Medication List       Accurate as of October 27, 2018  9:26 PM. Always use your most recent med list.        busPIRone 10 MG tablet Commonly known as:  BUSPAR Take 5 mg by mouth 2 (two) times daily.   calcium-vitamin D 500-200 MG-UNIT Tabs tablet Commonly known as:  OSCAL WITH D Take 1 tablet by mouth daily.   DUREZOL 0.05 % Emul Generic drug:  Difluprednate Apply 1 drop to eye daily at 8 pm.   Fish Oil 1000 MG Caps Take 1 capsule by mouth daily.   FREESTYLE LIBRE 14 DAY READER Devi USE AS DIRECTED   FREESTYLE LIBRE 14 DAY SENSOR Misc 1 Units by Does not apply route every 14 (fourteen) days.   glucose blood test strip Commonly known as:  ONE TOUCH ULTRA TEST TEST FOUR TIMES A DAY AS DIRECTED.   insulin aspart 100 UNIT/ML FlexPen Commonly known as:  NOVOLOG FLEXPEN Use 8 to 12 units before each meal as directed   Insulin Pen Needle 32G X 4 MM Misc Commonly known as:  SURE COMFORT PEN NEEDLES Use daily with insulin 4 times a day   LANTUS SOLOSTAR 100 UNIT/ML Solostar Pen Generic drug:  Insulin Glargine INJECT 90 UNITS INTO THE SKIN DAILY   lisinopril 2.5 MG tablet Commonly known as:  PRINIVIL,ZESTRIL TAKE ONE (1) TABLET BY  MOUTH EVERY DAY   metoCLOPramide 5 MG tablet Commonly known as:  REGLAN TAKE ONE TO TWO TABLETS EVERY SIX HOURS AS NEEDED FOR NAUSEA OR VOMITING   mirtazapine 30 MG tablet Commonly known as:  REMERON Take 1 tablet  by mouth daily at 8 pm.   montelukast 10 MG tablet Commonly known as:  SINGULAIR TAKE ONE TABLET BY MOUTH EVERY NIGHT AT BEDTIME.   omeprazole 40 MG capsule Commonly known as:  PRILOSEC TAKE ONE CAPSULE BY MOUTH 30 MINUTES PRIOR TO BREAKFAST AND SUPPER.   ondansetron 4 MG tablet Commonly known as:  ZOFRAN TAKE ONE TABLET BY MOUTH EVERY EIGHT HOURS AS NEEDED FOR NAUSEA OR VOMITING   ONE TOUCH ULTRA MINI w/Device Kit Use as directed   ONETOUCH DELICA LANCETS 09B Misc USE FOUR TIMES A DAY AS DIRECTED.   pravastatin 80 MG tablet Commonly known as:  PRAVACHOL TAKE ONE TABLET BY MOUTH IN THE EVENING   vitamin B-12 100 MCG tablet Commonly known as:  CYANOCOBALAMIN Take 1 tablet by mouth daily.       Allergies: No Known Allergies  Past Medical History:  Diagnosis Date  . Anxiety   . Dyslipidemia   . Gastroparesis due to DM ([place]) OCT 2014   75% AT 2 HRS, GLU >   . H/O eye surgery 01/2018  . Headache(784.0)   . IDDM (insulin dependent diabetes mellitus) ([place])   . Neuropathy   . Nicotine addiction   . TIA (transient ischemic attack)    Dr. Merlene Laughter    Past Surgical History:  Procedure Laterality Date  . Cataract surgery Bilateral 01/20/2017, 02/03/2017  . COLONOSCOPY N/A 05/09/2016   Procedure: COLONOSCOPY;  Surgeon: Danie Binder, MD;  Location: [facility];  Service: Endoscopy;  Laterality: N/A;  130  . ESOPHAGOGASTRODUODENOSCOPY N/A 08/31/2013   SLF: 1. Earky satiety nausea may be due to Gastroparesis/pyloric channel stenosis. 2. small hiatal hernia 3. Moderate erosive gastritis.   Marland Kitchen EYE SURGERY  01/2018  . laser surgery bilateral eye      Family History  Problem Relation Age of Onset  . Hypertension Mother   . Diabetes Mother   .  Hyperlipidemia Mother   . Rosacea Mother   . Hypertension Father   . Diabetes Brother   . Breast cancer Maternal Aunt   . Colon cancer Neg Hx     Social History:  reports that she quit smoking about 7 years ago. Her smoking use included cigarettes. She has a 36.00 pack-year smoking history. She has never used smokeless tobacco. She reports that she does not drink alcohol or use drugs.   Review of Systems   Lipid history: Followed by PCP, currently on pravastatin 80 mg Labs as follows    Lab Results  Component Value Date   CHOL 197 02/12/2018   HDL 72 02/12/2018   LDLCALC 106 (H) 02/12/2018   TRIG 92 02/12/2018   CHOLHDL 2.7 02/12/2018           Hypertension: Has been present  BP Readings from Last 3 Encounters:  10/05/18 110/60  09/15/18 117/79  09/11/18 122/76    Most recent eye exam was in 5/19  Most recent foot exam: 9/19  Currently known complications of diabetes: Gastroparesis, retinopathy, no nephropathy  LABS:  No visits with results within 1 Week(s) from this visit.  Latest known visit with results is:  Office Visit on 07/31/2018  Component Date Value Ref Range Status  . Hemoglobin A1C 07/31/2018 8.9* 4.0 - 5.6 % Final  . Color, Urine 07/31/2018 YELLOW  Yellow;Lt. Yellow Final  . APPearance 07/31/2018 CLEAR  Clear Final  . Specific Gravity, Urine 07/31/2018 1.015  1.000 - 1.030 Final  . pH 07/31/2018 6.0  5.0 - 8.0 Final  .  Total Protein, Urine 07/31/2018 NEGATIVE  Negative Final  . Urine Glucose 07/31/2018 NEGATIVE  Negative Final  . Ketones, ur 07/31/2018 NEGATIVE  Negative Final  . Bilirubin Urine 07/31/2018 NEGATIVE  Negative Final  . Hgb urine dipstick 07/31/2018 NEGATIVE  Negative Final  . Urobilinogen, UA 07/31/2018 0.2  0.0 - 1.0 Final  . Leukocytes, UA 07/31/2018 NEGATIVE  Negative Final  . Nitrite 07/31/2018 NEGATIVE  Negative Final  . WBC, UA 07/31/2018 0-2/hpf  0-2/hpf Final  . RBC / HPF 07/31/2018 none seen  0-2/hpf Final  . Squamous  Epithelial / LPF 07/31/2018 Rare(0-4/hpf)  Rare(0-4/hpf) Final  . Bacteria, UA 07/31/2018 Rare(<10/hpf)* None Final  . Microalb, Ur 07/31/2018 6.1* 0.0 - 1.9 mg/dL Final  . Creatinine,U 07/31/2018 143.7  mg/dL Final  . Microalb Creat Ratio 07/31/2018 4.3  0.0 - 30.0 mg/g Final    Physical Examination:  LMP 07/26/2013   GENERAL:    She is averagely built and nourished  HEENT:         Eye exam shows normal external appearance.  Fundus exam shows no retinopathy.  Oral exam shows normal mucosa .   NECK:   There is no lymphadenopathy  Thyroid is not enlarged and no nodules felt.   Carotids are normal to palpation and no bruit heard  LUNGS:         Chest is symmetrical. Lungs are clear to auscultation.Marland Kitchen   HEART:         Heart sounds:  S1 and S2 are normal. No murmur or click heard., no S3 or S4.   ABDOMEN:   There is no distention present. Liver and spleen are not palpable.  No other mass or tenderness present.    NEUROLOGICAL:   Ankle jerks are absent bilaterally.    Diabetic Foot Exam - Simple   No data filed             Vibration sense is practically absent in distal first toes.  MUSCULOSKELETAL:  There is no swelling or deformity of the peripheral joints.     EXTREMITIES:     There is no ankle edema.  SKIN:       No rash or lesions of concern.        ASSESSMENT:  Diabetes type 1, consistently poorly controlled  Recent A1c 8.9  See history of present illness for detailed discussion of current diabetes management, blood sugar patterns and problems identified   She is on unusually large doses of Lantus once a day but this is not causing hypoglycemia Her blood sugars are relatively good fasting but most of the time around 200 later in the day  However her Lantus may not be lasting 24 hours consistently also with taking it once a day in the mid afternoon She likely needs higher doses of mealtime insulin than what she is taking Currently taking mealtime dose with a fixed  amount and no adjustment for carbohydrate intake or type of meals/fat intake Relatively her basal insulin she is taking a small amount of mealtime insulin only  Some of the difficulty with her control is also related to the relatively high fat diet She does need more motivation, currently not exercising also Overall does need more diabetes education  Complications of diabetes: Proliferative retinopathy, gastroparesis, neuropathy with sensory loss  Hypercholesterolemia with last LDL still over 100 on medication, followed by PCP, may benefit from a more potent statin  Hypertension: Mild and well controlled  PLAN:    Change Lantus to 30  units twice daily for more effective 24 controlled and less variability  Recommend that she should at some point switch to [place] or [place] insulin for more consistent day-to-day control and less variability but she is reluctant to change at this time  She needs to be seen for more diabetes education including meal planning and carbohydrate counting  She will try to look up carbohydrate intake at mealtimes and will try to use 1: 5 carbohydrate coverage along with additional insulin for higher fat meals up to 50%  She will also continue to add additional amounts for high sugars over 150  She was recommended using the freestyle libre system and discussed that she will have much more complete information on her blood sugar patterns along with increased convenience and ability to control postprandial readings better as related at potential hypoglycemia.  She will be instructed on this by diabetes educator  She was recommended an insulin pump also but she is reluctant to consider this now  For her gastroparesis she will need to take her METOCLOPRAMIDE consistently before every meal and may take 10 mg at times if still having nausea  Regular walking for exercise  Better diet with lower fat content  Check urine MICROALBUMIN today  There are no Patient  Instructions on file for this visit.   Consultation note has been sent to the referring physician  Counseling time on subjects discussed in assessment and plan sections is over 50% of today's 60 minute visit  Elayne Snare 10/27/2018, 9:26 PM   Note: This office note was prepared with Dragon voice recognition system technology. Any transcriptional errors that result from this process are unintentional.

## 2018-10-28 ENCOUNTER — Ambulatory Visit: Payer: PPO | Admitting: Endocrinology

## 2018-11-02 ENCOUNTER — Other Ambulatory Visit: Payer: Self-pay | Admitting: Endocrinology

## 2018-11-13 ENCOUNTER — Other Ambulatory Visit: Payer: Self-pay | Admitting: Nurse Practitioner

## 2018-11-26 ENCOUNTER — Ambulatory Visit (INDEPENDENT_AMBULATORY_CARE_PROVIDER_SITE_OTHER): Payer: PPO | Admitting: Endocrinology

## 2018-11-26 ENCOUNTER — Encounter: Payer: Self-pay | Admitting: Endocrinology

## 2018-11-26 VITALS — BP 102/62 | HR 88 | Ht 68.0 in | Wt 136.2 lb

## 2018-11-26 DIAGNOSIS — E1065 Type 1 diabetes mellitus with hyperglycemia: Secondary | ICD-10-CM | POA: Diagnosis not present

## 2018-11-26 DIAGNOSIS — K3184 Gastroparesis: Secondary | ICD-10-CM

## 2018-11-26 DIAGNOSIS — E1043 Type 1 diabetes mellitus with diabetic autonomic (poly)neuropathy: Secondary | ICD-10-CM

## 2018-11-26 LAB — POCT GLYCOSYLATED HEMOGLOBIN (HGB A1C): Hemoglobin A1C: 8.6 % — AB (ref 4.0–5.6)

## 2018-11-26 NOTE — Progress Notes (Signed)
Patient ID: Ashley Armstrong, female   DOB: 1966/10/30, 53 y.o.   MRN: [id number]          Reason for Appointment: Follow-up for Type 1 Diabetes  Referring physician: Dr. [name]   History of Present Illness:          Date of diagnosis of type 1 diabetes mellitus: Age 88       Background history:  She has been on insulin practically since her diagnosis was made.  Initially she had symptoms of weight loss and blurred vision For several years has been on Lantus and NovoLog but usually has had poor control with A1c mostly between 8-10%  Recent history:   Most recent A1c is 8.6, previously 8.9   INSULIN regimen is:  Lantus 30 units twice a day, breakfast and pm.  NovoLog mostly 10-15 units before meals       Current management, blood sugar patterns and problems identified:  After her initial visit her Lantus was changed to twice a day instead of once a day  Total doses are unchanged  She is taking mostly fixed dose of NovoLog at mealtimes even though she was told to adjusted somewhat based on carbohydrates  She says that she will take a base of 10 units and at 1 unit per 10 g of carbohydrates to the meal and sometimes more for higher fat meals  Although she thinks she is taking her NovoLog consistently with every meal she has had a regular mealtimes  With starting the freestyle libre sensor she has checked her sugars only very sporadically and blood sugar information is only available for her overnight patterns and some in the early evenings  Overall blood sugars appear to be HIGHER in the afternoons and generally getting lower later at night  Has had HYPOGLYCEMIA once waking up but her blood sugars appear to be still mostly high through the night and usually taking it right when she is eating a meal  She feels that her blood sugars fluctuate more because of her gastroparesis and after meals may be inconsistent for this reason  She has had more stress causing her sugars  to be higher           Meal times are:  Breakfast is 7 AM at Lunch: 12 PM dinner: 5 PM  Typical meal intake: Breakfast is frequently high fat but otherwise eggs and toast.  Lunch usually a mixed meal and evening meal may be a sandwich with bland food.  Snacks will be cheese and crackers Usually avoiding drinks with sugar Eating out only about once a week at fast food restaurants               Exercise: None    Glucose monitoring:  done 4 times a day         Glucometer:  [employer].       Blood Glucose readings by time of day and averages from record   PRE-MEAL Fasting Lunch Dinner Bedtime Overall  Glucose range:       Mean/median:  212  232  225  191 221   POST-MEAL PC Breakfast PC Lunch PC Dinner  Glucose range:     Mean/median:   320  178   PREVIOUS readings:  PREMEAL Breakfast Lunch Dinner Bedtime  Overall   Glucose range:  70-199  112-298  184-238  118-264   Median:        POST-MEAL PC Breakfast PC Lunch PC Dinner  Glucose  range:     Median:       Dietician visit, most recent: 11/18  Weight history:  Wt Readings from Last 3 Encounters:  11/26/18 136 lb 3.2 oz (61.8 kg)  10/05/18 135 lb (61.2 kg)  09/15/18 133 lb 6.4 oz (60.5 kg)    Glycemic control:   Lab Results  Component Value Date   HGBA1C 8.9 (A) 07/31/2018   HGBA1C 8.9 (H) 02/12/2018   HGBA1C 8.3 (H) 10/18/2017   Lab Results  Component Value Date   MICROALBUR 6.1 (H) 07/31/2018   LDLCALC 106 (H) 02/12/2018   CREATININE 1.01 (H) 05/18/2018   Lab Results  Component Value Date   MICRALBCREAT 4.3 07/31/2018    No results found for: FRUCTOSAMINE  No visits with results within 1 Week(s) from this visit.  Latest known visit with results is:  Office Visit on 07/31/2018  Component Date Value Ref Range Status  . Hemoglobin A1C 07/31/2018 8.9* 4.0 - 5.6 % Final  . Color, Urine 07/31/2018 YELLOW  Yellow;Lt. Yellow Final  . APPearance 07/31/2018 CLEAR  Clear Final  . Specific Gravity, Urine  07/31/2018 1.015  1.000 - 1.030 Final  . pH 07/31/2018 6.0  5.0 - 8.0 Final  . Total Protein, Urine 07/31/2018 NEGATIVE  Negative Final  . Urine Glucose 07/31/2018 NEGATIVE  Negative Final  . Ketones, ur 07/31/2018 NEGATIVE  Negative Final  . Bilirubin Urine 07/31/2018 NEGATIVE  Negative Final  . Hgb urine dipstick 07/31/2018 NEGATIVE  Negative Final  . Urobilinogen, UA 07/31/2018 0.2  0.0 - 1.0 Final  . Leukocytes, UA 07/31/2018 NEGATIVE  Negative Final  . Nitrite 07/31/2018 NEGATIVE  Negative Final  . WBC, UA 07/31/2018 0-2/hpf  0-2/hpf Final  . RBC / HPF 07/31/2018 none seen  0-2/hpf Final  . Squamous Epithelial / LPF 07/31/2018 Rare(0-4/hpf)  Rare(0-4/hpf) Final  . Bacteria, UA 07/31/2018 Rare(<10/hpf)* None Final  . Microalb, Ur 07/31/2018 6.1* 0.0 - 1.9 mg/dL Final  . Creatinine,U 07/31/2018 143.7  mg/dL Final  . Microalb Creat Ratio 07/31/2018 4.3  0.0 - 30.0 mg/g Final    Allergies as of 11/26/2018   No Known Allergies     Medication List       Accurate as of November 26, 2018 11:01 AM. Always use your most recent med list.        busPIRone 10 MG tablet Commonly known as:  BUSPAR Take 5 mg by mouth 2 (two) times daily.   calcium-vitamin D 500-200 MG-UNIT Tabs tablet Commonly known as:  OSCAL WITH D Take 1 tablet by mouth daily.   DUREZOL 0.05 % Emul Generic drug:  Difluprednate Apply 1 drop to eye daily at 8 pm.   Fish Oil 1000 MG Caps Take 1 capsule by mouth daily.   FREESTYLE LIBRE 14 DAY READER Devi USE AS DIRECTED   FREESTYLE LIBRE 14 DAY SENSOR Misc 1 Units by Does not apply route every 14 (fourteen) days.   glucose blood test strip Commonly known as:  ONE TOUCH ULTRA TEST TEST FOUR TIMES A DAY AS DIRECTED.   Insulin Pen Needle 32G X 4 MM Misc Commonly known as:  SURE COMFORT PEN NEEDLES Use daily with insulin 4 times a day   LANTUS SOLOSTAR 100 UNIT/ML Solostar Pen Generic drug:  Insulin Glargine INJECT 90 UNITS INTO THE SKIN DAILY     lisinopril 2.5 MG tablet Commonly known as:  PRINIVIL,ZESTRIL TAKE ONE (1) TABLET BY MOUTH EVERY DAY   metoCLOPramide 5 MG tablet Commonly known as:  REGLAN TAKE ONE TO TWO TABLETS BY MOUTH EVERY SIX HOURS AS NEEDED FOR NAUSEAOR VOMITING   mirtazapine 30 MG tablet Commonly known as:  REMERON Take 1 tablet by mouth daily at 8 pm.   montelukast 10 MG tablet Commonly known as:  SINGULAIR TAKE ONE TABLET BY MOUTH EVERY NIGHT AT BEDTIME.   NOVOLOG FLEXPEN 100 UNIT/ML FlexPen Generic drug:  insulin aspart INJECT 8 TO 12 UNITS SUBCUTANEOUSLY BEFORE EACH MEAL AS DIRECTED.ADJUST PER SLIDING SCALE DEPENDING ON SUGAR READINGMAX DOSE PER DAY 51 UNIT   omeprazole 40 MG capsule Commonly known as:  PRILOSEC TAKE ONE CAPSULE BY MOUTH 30 MINUTES PRIOR TO BREAKFAST AND SUPPER.   ondansetron 4 MG tablet Commonly known as:  ZOFRAN TAKE ONE TABLET BY MOUTH EVERY EIGHT HOURS AS NEEDED FOR NAUSEA OR VOMITING   ONE TOUCH ULTRA MINI w/Device Kit Use as directed   ONETOUCH DELICA LANCETS 42A Misc USE FOUR TIMES A DAY AS DIRECTED.   pravastatin 80 MG tablet Commonly known as:  PRAVACHOL TAKE ONE TABLET BY MOUTH IN THE EVENING   vitamin B-12 100 MCG tablet Commonly known as:  CYANOCOBALAMIN Take 1 tablet by mouth daily.       Allergies: No Known Allergies  Past Medical History:  Diagnosis Date  . Anxiety   . Dyslipidemia   . Gastroparesis due to DM ([place]) OCT 2014   75% AT 2 HRS, GLU >   . H/O eye surgery 01/2018  . Headache(784.0)   . IDDM (insulin dependent diabetes mellitus) ([place])   . Neuropathy   . Nicotine addiction   . TIA (transient ischemic attack)    Dr. Merlene Laughter    Past Surgical History:  Procedure Laterality Date  . Cataract surgery Bilateral 01/20/2017, 02/03/2017  . COLONOSCOPY N/A 05/09/2016   Procedure: COLONOSCOPY;  Surgeon: Danie Binder, MD;  Location: [facility];  Service: Endoscopy;  Laterality: N/A;  130  . ESOPHAGOGASTRODUODENOSCOPY N/A 08/31/2013    SLF: 1. Earky satiety nausea may be due to Gastroparesis/pyloric channel stenosis. 2. small hiatal hernia 3. Moderate erosive gastritis.   [facility] EYE SURGERY  01/2018  . laser surgery bilateral eye      Family History  Problem Relation Age of Onset  . Hypertension Mother   . Diabetes Mother   . Hyperlipidemia Mother   . Rosacea Mother   . Hypertension Father   . Diabetes Brother   . Breast cancer Maternal Aunt   . Colon cancer Neg Hx     Social History:  reports that she quit smoking about 7 years ago. Her smoking use included cigarettes. She has a 36.00 pack-year smoking history. She has never used smokeless tobacco. She reports that she does not drink alcohol or use drugs.   Review of Systems   Lipid history: Followed by PCP, currently on pravastatin 80 mg Labs as follows    Lab Results  Component Value Date   CHOL 197 02/12/2018   HDL 72 02/12/2018   LDLCALC 106 (H) 02/12/2018   TRIG 92 02/12/2018   CHOLHDL 2.7 02/12/2018           Hypertension: Recently well controlled  BP Readings from Last 3 Encounters:  11/26/18 102/62  10/05/18 110/60  09/15/18 117/79    Most recent eye exam was in 5/19  Most recent foot exam: 9/19  GASTROPARESIS: Overall her nausea is better but recently at times will still have some nausea and vomiting Followed by gastroenterologist now She is trying to take her metoclopramide  before eating most of the time  Currently known complications of diabetes: Gastroparesis, neuropathy, retinopathy, no nephropathy    LABS:  No visits with results within 1 Week(s) from this visit.  Latest known visit with results is:  Office Visit on 07/31/2018  Component Date Value Ref Range Status  . Hemoglobin A1C 07/31/2018 8.9* 4.0 - 5.6 % Final  . Color, Urine 07/31/2018 YELLOW  Yellow;Lt. Yellow Final  . APPearance 07/31/2018 CLEAR  Clear Final  . Specific Gravity, Urine 07/31/2018 1.015  1.000 - 1.030 Final  . pH 07/31/2018 6.0  5.0 - 8.0 Final  .  Total Protein, Urine 07/31/2018 NEGATIVE  Negative Final  . Urine Glucose 07/31/2018 NEGATIVE  Negative Final  . Ketones, ur 07/31/2018 NEGATIVE  Negative Final  . Bilirubin Urine 07/31/2018 NEGATIVE  Negative Final  . Hgb urine dipstick 07/31/2018 NEGATIVE  Negative Final  . Urobilinogen, UA 07/31/2018 0.2  0.0 - 1.0 Final  . Leukocytes, UA 07/31/2018 NEGATIVE  Negative Final  . Nitrite 07/31/2018 NEGATIVE  Negative Final  . WBC, UA 07/31/2018 0-2/hpf  0-2/hpf Final  . RBC / HPF 07/31/2018 none seen  0-2/hpf Final  . Squamous Epithelial / LPF 07/31/2018 Rare(0-4/hpf)  Rare(0-4/hpf) Final  . Bacteria, UA 07/31/2018 Rare(<10/hpf)* None Final  . Microalb, Ur 07/31/2018 6.1* 0.0 - 1.9 mg/dL Final  . Creatinine,U 07/31/2018 143.7  mg/dL Final  . Microalb Creat Ratio 07/31/2018 4.3  0.0 - 30.0 mg/g Final    Physical Examination:  BP 102/62 (BP Location: Left Arm, Patient Position: Sitting, Cuff Size: Normal)   Pulse 88   Ht '5\' 8"'$  (1.727 m)   Wt 136 lb 3.2 oz (61.8 kg)   LMP 07/26/2013   SpO2 96%   BMI 20.71 kg/m        ASSESSMENT:  Diabetes type 1, poorly controlled with multiple complications  Recent C1E 8.6, previously 8.9  See history of present illness for detailed discussion of current diabetes management, blood sugar patterns and problems identified   Her blood sugars are difficult to assess because of lack of adequate monitoring Also she thinks because of her stress and recent personal issues she has not been able to check her sugar, potential to her diet and distress is increasing her blood sugars Overall blood sugars are higher during the day and somewhat lower overnight without significant hypoglycemia  She also has difficulty estimating how much NovoLog to take She is trying to pay attention to carbohydrate and fat but not following instructions for carbohydrate counting to help her dose the NovoLog dose   PLAN:    Change Lantus to 36 units in the morning and 26  in the evening  May consider switching to [place] in the future  She will use a 1: 3 coverage for her carbohydrates which appears to be approximately what she needs  However she needs more diabetes education and needs to be seen by nutritionist to help her with estimating carbohydrates better and also help assess her blood per carbohydrate ratio Needs to check blood sugars consistently throughout the day  Discussed blood sugar targets both fasting and after meals  To try and make sure she takes her metoclopramide 30 minutes before eating  She will follow-up in 2 months   Counseling time on subjects discussed in assessment and plan sections is over 50% of today's 25 minute visit  Elayne Snare 11/26/2018, 11:01 AM   Note: This office note was prepared with Dragon voice recognition system technology. Any transcriptional errors that  result from this process are unintentional.

## 2018-11-26 NOTE — Patient Instructions (Signed)
Lantus 26 units in pm and 36 in pm  Novolog divide Carbs by 3 plus extra if high

## 2018-12-15 ENCOUNTER — Ambulatory Visit: Payer: PPO | Admitting: Sports Medicine

## 2018-12-17 ENCOUNTER — Telehealth: Payer: Self-pay

## 2018-12-17 DIAGNOSIS — IMO0001 Reserved for inherently not codable concepts without codable children: Secondary | ICD-10-CM

## 2018-12-17 DIAGNOSIS — E785 Hyperlipidemia, unspecified: Secondary | ICD-10-CM

## 2018-12-17 DIAGNOSIS — Z794 Long term (current) use of insulin: Secondary | ICD-10-CM | POA: Diagnosis not present

## 2018-12-17 DIAGNOSIS — E1165 Type 2 diabetes mellitus with hyperglycemia: Secondary | ICD-10-CM | POA: Diagnosis not present

## 2018-12-17 DIAGNOSIS — E559 Vitamin D deficiency, unspecified: Secondary | ICD-10-CM | POA: Diagnosis not present

## 2018-12-17 NOTE — Telephone Encounter (Signed)
Labs reordered and updated

## 2018-12-18 LAB — COMPLETE METABOLIC PANEL WITH GFR
AG RATIO: 1.5 (calc) (ref 1.0–2.5)
ALKALINE PHOSPHATASE (APISO): 98 U/L (ref 37–153)
ALT: 14 U/L (ref 6–29)
AST: 17 U/L (ref 10–35)
Albumin: 4 g/dL (ref 3.6–5.1)
BILIRUBIN TOTAL: 0.3 mg/dL (ref 0.2–1.2)
BUN/Creatinine Ratio: 17 (calc) (ref 6–22)
BUN: 18 mg/dL (ref 7–25)
CO2: 29 mmol/L (ref 20–32)
Calcium: 9.5 mg/dL (ref 8.6–10.4)
Chloride: 104 mmol/L (ref 98–110)
Creat: 1.06 mg/dL — ABNORMAL HIGH (ref 0.50–1.05)
GFR, EST NON AFRICAN AMERICAN: 61 mL/min/{1.73_m2} (ref >=60)
GFR, Est African American: 70 mL/min/{1.73_m2} (ref >=60)
GLOBULIN: 2.7 g/dL (ref 1.9–3.7)
Glucose, Bld: 177 mg/dL — ABNORMAL HIGH (ref 65–99)
POTASSIUM: 4.8 mmol/L (ref 3.5–5.3)
SODIUM: 140 mmol/L (ref 135–146)
Total Protein: 6.7 g/dL (ref 6.1–8.1)

## 2018-12-18 LAB — LIPID PANEL
CHOL/HDL RATIO: 2.9 (calc) (ref <5.0)
Cholesterol: 196 mg/dL (ref <200)
HDL: 67 mg/dL (ref >=50)
LDL Cholesterol (Calc): 109 mg/dL (calc) — ABNORMAL HIGH
NON-HDL CHOLESTEROL (CALC): 129 mg/dL (ref <130)
TRIGLYCERIDES: 105 mg/dL (ref <150)

## 2018-12-18 LAB — VITAMIN D 25 HYDROXY (VIT D DEFICIENCY, FRACTURES): Vit D, 25-Hydroxy: 36 ng/mL (ref 30–100)

## 2018-12-18 LAB — HEMOGLOBIN A1C W/OUT EAG: HEMOGLOBIN A1C: 10.1 %{Hb} — AB (ref <5.7)

## 2018-12-21 DIAGNOSIS — F410 Panic disorder [episodic paroxysmal anxiety] without agoraphobia: Secondary | ICD-10-CM | POA: Diagnosis not present

## 2018-12-28 ENCOUNTER — Other Ambulatory Visit: Payer: Self-pay | Admitting: Nurse Practitioner

## 2018-12-31 DIAGNOSIS — F410 Panic disorder [episodic paroxysmal anxiety] without agoraphobia: Secondary | ICD-10-CM | POA: Diagnosis not present

## 2019-01-02 ENCOUNTER — Other Ambulatory Visit: Payer: Self-pay | Admitting: Endocrinology

## 2019-01-06 ENCOUNTER — Other Ambulatory Visit: Payer: Self-pay | Admitting: Endocrinology

## 2019-01-07 DIAGNOSIS — F410 Panic disorder [episodic paroxysmal anxiety] without agoraphobia: Secondary | ICD-10-CM | POA: Diagnosis not present

## 2019-01-11 ENCOUNTER — Other Ambulatory Visit: Payer: Self-pay

## 2019-01-11 ENCOUNTER — Telehealth: Payer: Self-pay | Admitting: Endocrinology

## 2019-01-11 MED ORDER — FREESTYLE LIBRE 14 DAY READER DEVI: 1.0000 | 0 refills | Status: DC

## 2019-01-11 MED ORDER — FREESTYLE LIBRE 14 DAY SENSOR MISC: 1.0000 [IU] | 4 refills | Status: DC

## 2019-01-11 NOTE — Telephone Encounter (Signed)
Rx was sent to pharmacy and pt was called and notified of this.

## 2019-01-11 NOTE — Telephone Encounter (Signed)
Patient stated that her refill for the freestyle Libra sensors where denied by our office and patient is unsure why  Requesting call back

## 2019-01-12 ENCOUNTER — Ambulatory Visit: Payer: PPO | Admitting: Podiatry

## 2019-01-12 ENCOUNTER — Encounter: Payer: Self-pay | Admitting: Podiatry

## 2019-01-12 DIAGNOSIS — B351 Tinea unguium: Secondary | ICD-10-CM | POA: Diagnosis not present

## 2019-01-12 DIAGNOSIS — M79675 Pain in left toe(s): Secondary | ICD-10-CM

## 2019-01-12 DIAGNOSIS — M79674 Pain in right toe(s): Secondary | ICD-10-CM

## 2019-01-12 DIAGNOSIS — E1142 Type 2 diabetes mellitus with diabetic polyneuropathy: Secondary | ICD-10-CM | POA: Diagnosis not present

## 2019-01-12 NOTE — Progress Notes (Signed)
Complaint:  Visit Type: Patient returns to my office for continued preventative foot care services. Complaint: Patient states" my nails have grown long and thick and become painful to walk and wear shoes" Patient has been diagnosed with DM with neuropathy.. The patient presents for preventative foot care services. No changes to ROS  Podiatric Exam: Vascular: dorsalis pedis and posterior tibial pulses are palpable bilateral. Capillary return is immediate. Temperature gradient is WNL. Skin turgor WNL  Sensorium: Absent  Semmes Weinstein monofilament test. Normal tactile sensation bilaterally. Nail Exam: Pt has thick disfigured discolored nails with subungual debris noted bilateral entire nail hallux through fifth toenails Ulcer Exam: There is no evidence of ulcer or pre-ulcerative changes or infection. Orthopedic Exam: Muscle tone and strength are WNL. No limitations in general ROM. No crepitus or effusions noted. Foot type and digits show no abnormalities. Bony prominences are unremarkable. Skin: No Porokeratosis. No infection or ulcers  Diagnosis:  Onychomycosis, , Pain in right toe, pain in left toes  Diabetes with neuropathy.  Treatment & Plan Procedures and Treatment: Consent by patient was obtained for treatment procedures.   Debridement of mycotic and hypertrophic toenails, 1 through 5 bilateral and clearing of subungual debris. No ulceration, no infection noted.  Return Visit-Office Procedure: Patient instructed to return to the office for a follow up visit 3 months for continued evaluation and treatment.    Rucker Pridgeon DPM 

## 2019-01-14 ENCOUNTER — Ambulatory Visit: Payer: PPO | Admitting: Nurse Practitioner

## 2019-01-21 DIAGNOSIS — F410 Panic disorder [episodic paroxysmal anxiety] without agoraphobia: Secondary | ICD-10-CM | POA: Diagnosis not present

## 2019-01-28 ENCOUNTER — Ambulatory Visit: Payer: PPO | Admitting: Endocrinology

## 2019-01-29 ENCOUNTER — Other Ambulatory Visit: Payer: Self-pay | Admitting: Endocrinology

## 2019-02-01 DIAGNOSIS — F329 Major depressive disorder, single episode, unspecified: Secondary | ICD-10-CM | POA: Diagnosis not present

## 2019-02-11 DIAGNOSIS — F410 Panic disorder [episodic paroxysmal anxiety] without agoraphobia: Secondary | ICD-10-CM | POA: Diagnosis not present

## 2019-02-15 ENCOUNTER — Other Ambulatory Visit: Payer: Self-pay | Admitting: Nurse Practitioner

## 2019-02-18 DIAGNOSIS — F410 Panic disorder [episodic paroxysmal anxiety] without agoraphobia: Secondary | ICD-10-CM | POA: Diagnosis not present

## 2019-02-25 DIAGNOSIS — F410 Panic disorder [episodic paroxysmal anxiety] without agoraphobia: Secondary | ICD-10-CM | POA: Diagnosis not present

## 2019-03-08 ENCOUNTER — Other Ambulatory Visit: Payer: Self-pay

## 2019-03-08 ENCOUNTER — Encounter: Payer: Self-pay | Admitting: Family Medicine

## 2019-03-08 ENCOUNTER — Ambulatory Visit (INDEPENDENT_AMBULATORY_CARE_PROVIDER_SITE_OTHER): Payer: PPO | Admitting: Family Medicine

## 2019-03-08 VITALS — BP 114/66 | HR 88 | Ht 68.0 in | Wt 136.0 lb

## 2019-03-08 DIAGNOSIS — IMO0001 Reserved for inherently not codable concepts without codable children: Secondary | ICD-10-CM

## 2019-03-08 DIAGNOSIS — E1165 Type 2 diabetes mellitus with hyperglycemia: Secondary | ICD-10-CM

## 2019-03-08 DIAGNOSIS — Z794 Long term (current) use of insulin: Secondary | ICD-10-CM | POA: Diagnosis not present

## 2019-03-08 DIAGNOSIS — F418 Other specified anxiety disorders: Secondary | ICD-10-CM | POA: Diagnosis not present

## 2019-03-08 DIAGNOSIS — E785 Hyperlipidemia, unspecified: Secondary | ICD-10-CM

## 2019-03-08 DIAGNOSIS — K219 Gastro-esophageal reflux disease without esophagitis: Secondary | ICD-10-CM | POA: Diagnosis not present

## 2019-03-08 DIAGNOSIS — J302 Other seasonal allergic rhinitis: Secondary | ICD-10-CM | POA: Diagnosis not present

## 2019-03-08 MED ORDER — PRAVASTATIN SODIUM 80 MG PO TABS: 80.0000 mg | ORAL_TABLET | Freq: Every evening | ORAL | 1 refills | Status: DC

## 2019-03-08 MED ORDER — MONTELUKAST SODIUM 10 MG PO TABS: 10.0000 mg | ORAL_TABLET | Freq: Every day | ORAL | 1 refills | Status: DC

## 2019-03-08 MED ORDER — LISINOPRIL 2.5 MG PO TABS: ORAL_TABLET | ORAL | 1 refills | Status: DC

## 2019-03-08 NOTE — Patient Instructions (Addendum)
Wellness due May 2 or after, please schedule  Annual physical exam with MD early October, call if you need me sooner  Mammogram is due in September and we will schedule  Fasting lipid, cmp and eGFR  and tSH  , microalb September 22 or after  My condolence re your recent multiple losses, reach out for all the help that you can get   Please work on blood sugar as control of this is very important for good health  Please check your pharmacy for shingrix vaccine ( 2 parts) they will  Let you know the cost and it is due  It is important that you exercise regularly at least 30 minutes 5 times a week. If you develop chest pain, have severe difficulty breathing, or feel very tired, stop exercising immediately and seek medical attention   Thanks for choosing [facility], we consider it a privelige to serve you.

## 2019-03-08 NOTE — Progress Notes (Signed)
Virtual Visit via Telephone Note  I connected with Ashley Armstrong on 03/08/19 at  1:20 PM EDT by telephone and verified that I am speaking with the correct person using two identifiers.   I discussed the limitations, risks, security and privacy concerns of performing an evaluation and management service by telephone and the availability of in person appointments. I also discussed with the patient that there may be a patient responsible charge related to this service. The patient expressed understanding and agreed to proceed. I am in the office and patient is in her home   History of Present Illness: Denies recent fever or chills. C/o increased sinus drainage which is clear, denies fever ,sore throat or ear pain Denies chest congestion, productive cough or wheezing. Denies chest pains, palpitations and leg swelling Denies abdominal pain, nausea, vomiting,diarrhea or constipation.   Denies dysuria, frequency, hesitancy or incontinence. Denies joint pain, swelling and limitation in mobility. Denies headaches, seizures, numbness, or tingling. Denies uncontrolled  depression, anxiety or insomnia. Denies skin break down or rash. C/o polyuria, polydipsia       Observations/Objective: BP 114/66   Pulse 88   Ht 5\' 8"  (1.727 m)   Wt 136 lb (61.7 kg)   LMP 07/26/2013   BMI 20.68 kg/m  Good communication with no confusion and intact memory. Alert and oriented x 3 No signs of respiratory distress during sppech    Assessment and Plan:  Diabetes mellitus, insulin dependent (IDDM), uncontrolled ([facility]) Deteriorated, reports increased stress and anxiety are the triggers, managed by Endo, encouraged to be diligent with diet and exercise, as welll as medication and blood sugar testing Ms. Cozine is reminded of the importance of commitment to daily physical activity for 30 minutes or more, as able and the need to limit carbohydrate intake to 30 to 60 grams per meal to help with blood sugar  control.   The need to take medication as prescribed, test blood sugar as directed, and to call between visits if there is a concern that blood sugar is uncontrolled is also discussed.   Ms. Benison is reminded of the importance of daily foot exam, annual eye examination, and good blood sugar, blood pressure and cholesterol control.  Diabetic Labs Latest Ref Rng & Units 12/17/2018 11/26/2018 07/31/2018 05/18/2018 02/12/2018  HbA1c <5.7 % of total Hgb 10.1(H) 8.6(A) 8.9(A) - 8.9(H)  Microalbumin 0.0 - 1.9 mg/dL - - 6.1(H) - -  Micro/Creat Ratio 0.0 - 30.0 mg/g - - 4.3 - -  Chol <200 mg/dL 196 - - - 197  HDL > OR = 50 mg/dL 67 - - - 72  Calc LDL mg/dL (calc) 109(H) - - - 106(H)  Triglycerides <150 mg/dL 105 - - - 92  Creatinine 0.50 - 1.05 mg/dL 1.06(H) - - 1.01(H) 1.14(H)   BP/Weight 03/08/2019 11/26/2018 10/05/2018 09/15/2018 09/11/2018 07/31/2018 0/86/5784  Systolic BP 696 295 284 132 440 102 725  Diastolic BP 66 62 60 79 76 80 86  Wt. (Lbs) 136 136.2 135 133.4 135 136 135  BMI 20.68 20.71 20.53 20.28 20.53 20.68 20.53   Foot/eye exam completion dates Latest Ref Rng & Units 07/31/2018 06/02/2018  Eye Exam No Retinopathy - -  Foot Form Completion - Done Done        Depression with anxiety Increased and less controlled in the past 8 months, reports loss of close friend and also her cat as well as 2 family members. Not suicidal or homicidal, but less able to control food choice and  behavior. Treated by mental health, and has good family support, she is encouraged to utilize both  GERD (gastroesophageal reflux disease) Requires chronic high dose PPII  Seasonal allergies Increased and uncontrolled currently , start daily singulair, and  Use face mask outside to lower pollen exposure and also COVID 19 risk  Hyperlipidemia LDL goal <100 Hyperlipidemia:Low fat diet discussed and encouraged.   Lipid Panel  Lab Results  Component Value Date   CHOL 196 12/17/2018   HDL 67 12/17/2018   LDLCALC  109 (H) 12/17/2018   TRIG 105 12/17/2018   CHOLHDL 2.9 12/17/2018   Not at goal needs to lowwer fried and fatty food intake     Follow Up Instructions:    I discussed the assessment and treatment plan with the patient. The patient was provided an opportunity to ask questions and all were answered. The patient agreed with the plan and demonstrated an understanding of the instructions.   The patient was advised to call back or seek an in-person evaluation if the symptoms worsen or if the condition fails to improve as anticipated.  I provided 44minutes of non-face-to-face time during this encounter.   [name], MD

## 2019-03-09 ENCOUNTER — Encounter: Payer: Self-pay | Admitting: Nurse Practitioner

## 2019-03-09 ENCOUNTER — Other Ambulatory Visit: Payer: Self-pay

## 2019-03-09 ENCOUNTER — Other Ambulatory Visit: Payer: Self-pay | Admitting: Endocrinology

## 2019-03-09 ENCOUNTER — Ambulatory Visit (INDEPENDENT_AMBULATORY_CARE_PROVIDER_SITE_OTHER): Payer: PPO | Admitting: Nurse Practitioner

## 2019-03-09 ENCOUNTER — Encounter: Payer: Self-pay | Admitting: Gastroenterology

## 2019-03-09 DIAGNOSIS — K3184 Gastroparesis: Secondary | ICD-10-CM | POA: Diagnosis not present

## 2019-03-09 DIAGNOSIS — K5901 Slow transit constipation: Secondary | ICD-10-CM | POA: Diagnosis not present

## 2019-03-09 DIAGNOSIS — R1032 Left lower quadrant pain: Secondary | ICD-10-CM

## 2019-03-09 NOTE — Assessment & Plan Note (Signed)
Abdominal pain improved, some intermittent flank pain which is likely related to her constipation.  When we are able to get her constipation better control but she still having abdominal pain symptoms we can further evaluate.  Follow-up in 6 months.

## 2019-03-09 NOTE — Assessment & Plan Note (Signed)
Overall doing moderately well with gastroparesis.  She does find it difficult to follow gastroparesis diet.  She was on Reglan and no noted adverse effects.  Recommend she continue her current medications, follow gastroparesis diet strictly as possible and follow-up in 6 months.  Call for any worsening symptoms.

## 2019-03-09 NOTE — Progress Notes (Signed)
Referring Provider: Fayrene Helper, MD Primary Care Physician:  Fayrene Helper, MD Primary GI:  Dr. Oneida Alar  NOTE: Service was provided via telemedicine and was requested by the patient due to COVID-19 pandemic.  Method of visit: Telephone (failed Zoom)  Patient Location: Car (patient not driving)  Provider Location: Office  Reason for Phone Visit: Follow-up  The patient was consented to phone follow-up via telephone encounter including billing of the encounter (yes/no): Yes  Persons present on the phone encounter, with roles: Parents  Total time (minutes) spent on medical discussion: 17 minutes  Chief Complaint  Patient presents with   Abdominal Pain   Constipation    HPI:   Ashley Armstrong is a 53 y.o. female who presents for virtual visit regarding: For follow-up on abdominal pain.  The patient was last seen in our office 09/15/2018 for constipation, GERD, gastroparesis, left lower quadrant abdominal pain.  At her visit noted left lower quadrant abdominal discomfort, Linzess worked "too well".  Has constipation and diarrhea although typically diarrhea after passing a constipated stool intermittent nausea, scant toilet tissue hematochezia after constipation (colonoscopy up-to-date 2017 and next due 2027).  Intermittent/rare upper abdominal discomfort still on PPI.  Intermittent bloating, admits she does not follow a gastroparesis diet 100% of the time and "I pay for it when I do not follow."  GERD generally doing well.  No other GI complaints.  Recommended trial of Amitiza 8 mcg twice daily, progress report in 1 week, follow-up in 4 months.  Progress report was not called as requested.  Today she states she's doing ok overall. Amitiza 8 mcg bid was too much. Is using MiraLAX prn which is working well for her. Has a bowel movement every 2 days, typically soft, sometimes hard. Is taking it every other day. Denies overt abdominal pain, hematochezia, melena, fever, chills,  unintentional weight loss. Denies URI and flu-like symptoms. Denies chest pain, dyspnea, dizziness, lightheadedness, syncope, near syncope. Denies any other upper or lower GI symptoms. Gastroparesis is doing ok, is trying to follow a gastroparesis diet "but it's hard to follow it."   Past Medical History:  Diagnosis Date   Anxiety    Dyslipidemia    Gastroparesis due to DM ([place]) OCT 2014   75% AT 2 HRS, GLU >    H/O eye surgery 01/2018   Headache(784.0)    IDDM (insulin dependent diabetes mellitus) ([place])    Neuropathy    Nicotine addiction    TIA (transient ischemic attack)    Dr. Merlene Laughter    Past Surgical History:  Procedure Laterality Date   Cataract surgery Bilateral 01/20/2017, 02/03/2017   COLONOSCOPY N/A 05/09/2016   Procedure: COLONOSCOPY;  Surgeon: Danie Binder, MD;  Location: [facility];  Service: Endoscopy;  Laterality: N/A;  130   ESOPHAGOGASTRODUODENOSCOPY N/A 08/31/2013   SLF: 1. [place]y satiety nausea may be due to Gastroparesis/pyloric channel stenosis. 2. small hiatal hernia 3. Moderate erosive gastritis.    EYE SURGERY  01/2018   laser surgery bilateral eye      Current Outpatient Medications  Medication Sig Dispense Refill   Blood Glucose Monitoring Suppl (ONE TOUCH ULTRA MINI) w/Device KIT Use as directed 1 each 0   busPIRone (BUSPAR) 10 MG tablet Take 5 mg by mouth 2 (two) times daily.     calcium-vitamin D (OSCAL WITH D) 500-200 MG-UNIT TABS tablet Take 1 tablet by mouth daily.     Continuous Blood Gluc Receiver (FREESTYLE LIBRE 14 DAY READER) DEVI  1 each by Other route See admin instructions. Change sensor every 2 weeks 3 Device 0   Continuous Blood Gluc Sensor (FREESTYLE LIBRE 14 DAY SENSOR) MISC USE AS DIRECTED EVERY 14 DAYS. 2 each 1   diazepam (VALIUM) 10 MG tablet Take one (1) Tablet Once a Day, As Necessary for panic     DUREZOL 0.05 % EMUL Apply 1 drop to eye daily at 8 pm.     glucose blood (ONE TOUCH ULTRA TEST) test strip TEST  FOUR TIMES A DAY AS DIRECTED. 100 each 11   Insulin Glargine (LANTUS SOLOSTAR) 100 UNIT/ML Solostar Pen Inject 36 units in the morning and 20 6 in the evening (Patient taking differently: Inject 31 units in the morning and 21 at  6 in the evening) 15 mL 2   Insulin Pen Needle (SURE COMFORT PEN NEEDLES) 32G X 4 MM MISC Use daily with insulin 4 times a day 400 each 1   lisinopril (ZESTRIL) 2.5 MG tablet TAKE ONE (1) TABLET BY MOUTH EVERY DAY 90 tablet 1   metoCLOPramide (REGLAN) 5 MG tablet TAKE ONE TO TWO TABLETS BY MOUTH EVERY SIX HOURS AS NEEDED FOR NAUSEAOR VOMITING 60 tablet 0   mirtazapine (REMERON) 30 MG tablet Take 1 tablet by mouth daily at 8 pm.     montelukast (SINGULAIR) 10 MG tablet Take 1 tablet (10 mg total) by mouth at bedtime. 90 tablet 1   NOVOLOG FLEXPEN 100 UNIT/ML FlexPen INJECT 8 TO 12 UNITS SUBCUTANEOUSLY BEFORE EACH MEAL AS DIRECTED. ADJUST PERSLIDING SCALE DEPENDING ON SUGAR READING. MAX DOSE PER DAY IS 51 45 mL 1   Omega-3 Fatty Acids (FISH OIL) 1000 MG CAPS Take 1 capsule by mouth daily.     omeprazole (PRILOSEC) 40 MG capsule TAKE ONE CAPSULE BY MOUTH 30 MINUTES PRIOR TO BREAKFAST AND SUPPER 180 capsule 3   ondansetron (ZOFRAN) 4 MG tablet TAKE ONE TABLET BY MOUTH EVERY EIGHT HOURS AS NEEDED FOR NAUSEA OR VOMITING 60 tablet 3   ONETOUCH DELICA LANCETS 87O MISC USE FOUR TIMES A DAY AS DIRECTED. 100 each 1   polyethylene glycol (MIRALAX / GLYCOLAX) 17 g packet Take 17 g by mouth daily as needed.     pravastatin (PRAVACHOL) 80 MG tablet Take 1 tablet (80 mg total) by mouth every evening. 90 tablet 1   vitamin B-12 (CYANOCOBALAMIN) 100 MCG tablet Take 1 tablet by mouth daily.     No current facility-administered medications for this visit.     Allergies as of 03/09/2019   (No Known Allergies)    Family History  Problem Relation Age of Onset   Hypertension Mother    Diabetes Mother    Hyperlipidemia Mother    Rosacea Mother    Hypertension Father     Diabetes Brother    Breast cancer Maternal Aunt    Colon cancer Neg Hx     Social History   Socioeconomic History   Marital status: Single    Spouse name: Not on file   Number of children: Not on file   Years of education: Not on file   Highest education level: Not on file  Occupational History   Occupation: disabled  Social Designer, fashion/clothing strain: Not very hard   Food insecurity:    Worry: Sometimes true    Inability: Sometimes true   Transportation needs:    Medical: No    Non-medical: No  Tobacco Use   Smoking status: Former Smoker    Packs/day: 1.50  Years: 24.00    Pack years: 36.00    Types: Cigarettes    Last attempt to quit: 08/24/2011    Years since quitting: 7.5   Smokeless tobacco: Never Used  Substance and Sexual Activity   Alcohol use: No   Drug use: No   Sexual activity: Not Currently    Birth control/protection: None  Lifestyle   Physical activity:    Days per week: 2 days    Minutes per session: 20 min   Stress: Very much  Relationships   Social connections:    Talks on phone: More than three times a week    Gets together: More than three times a week    Attends religious service: More than 4 times per year    Active member of club or organization: No    Attends meetings of clubs or organizations: Never    Relationship status: Never married  Other Topics Concern   Not on file  Social History Narrative   Disabled, lives with a room mate    Review of Systems: Complete ROS negative except as per HPI.  Physical Exam: Note: limited exam due to virtual visit General:   Alert and oriented. Pleasant and cooperative. Ears:  Normal auditory acuity. Skin:  Intact without facial significant lesions or rashes. Neurologic:  Alert and oriented x4 Psych:  Alert and cooperative. Normal mood and affect.

## 2019-03-09 NOTE — Assessment & Plan Note (Signed)
Constipation was started on Amitiza 8 mcg twice daily which worked too well for her.  She is now taking MiraLAX every other day and doing moderately well with this.  She does have a bowel movement every other day, occasional hard stools.  Recommended she try once daily dosing and if this is too much, back off to every other day and then a as needed dose when she does have a hard stool.  Follow-up in 6 months.

## 2019-03-09 NOTE — Patient Instructions (Signed)
Your health issues we discussed today were:   Abdominal pain and constipation: 1. As we discussed, try MiraLAX once a day. 2. If this is too much you can back off to every other day and then take an additional dose "as needed" for hard stools 3. Call if you have any severe worsening symptoms  Gastroparesis (slow emptying stomach): 1. Continue your best attempts to follow a gastroparesis diet 2. Continue your current medications 3. Call us if you have any severe worsening symptoms  Overall I recommend:  1. Continue your other medications 2. Call if you have any questions or concerns 3. Return for follow-up in 6 months   Because of recent events of COVID-19 ("Coronavirus"), follow CDC recommendations:  1. Wash your hand frequently 2. Avoid touching your face 3. Stay away from people who are sick 4. If you have symptoms such as fever, cough, shortness of breath then call your healthcare provider for further guidance 5. If you are sick, STAY AT HOME unless otherwise directed by your healthcare provider. 6. Follow directions from state and national officials regarding staying safe  At [facility] we value your feedback. You may receive a survey about your visit today. Please share your experience as we strive to create trusting relationships with our patients to provide genuine, compassionate, quality care.  We appreciate your understanding and patience as we review any laboratory studies, imaging, and other diagnostic tests that are ordered as we care for you. Our office policy is 5 business days for review of these results, and any emergent or urgent results are addressed in a timely manner for your best interest. If you do not hear from our office in 1 week, please contact us.   We also encourage the use of MyChart, which contains your medical information for your review as well. If you are not enrolled in this feature, an access code is on this after visit summary for  your convenience. Thank you for allowing [place] to be involved in your care.  It was great to see you today!  I hope you have a great day!!

## 2019-03-09 NOTE — Progress Notes (Signed)
cc'ed to pcp °

## 2019-03-13 ENCOUNTER — Encounter: Payer: Self-pay | Admitting: Family Medicine

## 2019-03-13 NOTE — Assessment & Plan Note (Signed)
Requires chronic high dose PPII

## 2019-03-13 NOTE — Assessment & Plan Note (Signed)
Increased and less controlled in the past 8 months, reports loss of close friend and also her cat as well as 2 family members. Not suicidal or homicidal, but less able to control food choice and behavior. Treated by mental health, and has good family support, she is encouraged to utilize both

## 2019-03-13 NOTE — Assessment & Plan Note (Signed)
Deteriorated, reports increased stress and anxiety are the triggers, managed by Endo, encouraged to be diligent with diet and exercise, as welll as medication and blood sugar testing Ashley Armstrong is reminded of the importance of commitment to daily physical activity for 30 minutes or more, as able and the need to limit carbohydrate intake to 30 to 60 grams per meal to help with blood sugar control.   The need to take medication as prescribed, test blood sugar as directed, and to call between visits if there is a concern that blood sugar is uncontrolled is also discussed.   Ashley Armstrong is reminded of the importance of daily foot exam, annual eye examination, and good blood sugar, blood pressure and cholesterol control.  Diabetic Labs Latest Ref Rng & Units 12/17/2018 11/26/2018 07/31/2018 05/18/2018 02/12/2018  HbA1c <5.7 % of total Hgb 10.1(H) 8.6(A) 8.9(A) - 8.9(H)  Microalbumin 0.0 - 1.9 mg/dL - - 6.1(H) - -  Micro/Creat Ratio 0.0 - 30.0 mg/g - - 4.3 - -  Chol <200 mg/dL 196 - - - 197  HDL > OR = 50 mg/dL 67 - - - 72  Calc LDL mg/dL (calc) 109(H) - - - 106(H)  Triglycerides <150 mg/dL 105 - - - 92  Creatinine 0.50 - 1.05 mg/dL 1.06(H) - - 1.01(H) 1.14(H)   BP/Weight 03/08/2019 11/26/2018 10/05/2018 09/15/2018 09/11/2018 07/31/2018 03/30/8021  Systolic BP 336 122 449 753 005 110 211  Diastolic BP 66 62 60 79 76 80 86  Wt. (Lbs) 136 136.2 135 133.4 135 136 135  BMI 20.68 20.71 20.53 20.28 20.53 20.68 20.53   Foot/eye exam completion dates Latest Ref Rng & Units 07/31/2018 06/02/2018  Eye Exam No Retinopathy - -  Foot Form Completion - Done Done

## 2019-03-13 NOTE — Assessment & Plan Note (Signed)
Increased and uncontrolled currently , start daily singulair, and  Use face mask outside to lower pollen exposure and also COVID 19 risk

## 2019-03-13 NOTE — Assessment & Plan Note (Signed)
Hyperlipidemia:Low fat diet discussed and encouraged.   Lipid Panel  Lab Results  Component Value Date   CHOL 196 12/17/2018   HDL 67 12/17/2018   LDLCALC 109 (H) 12/17/2018   TRIG 105 12/17/2018   CHOLHDL 2.9 12/17/2018   Not at goal needs to lowwer fried and fatty food intake

## 2019-03-16 ENCOUNTER — Encounter: Payer: Self-pay | Admitting: Endocrinology

## 2019-03-16 ENCOUNTER — Ambulatory Visit (INDEPENDENT_AMBULATORY_CARE_PROVIDER_SITE_OTHER): Payer: PPO | Admitting: Endocrinology

## 2019-03-16 DIAGNOSIS — E1065 Type 1 diabetes mellitus with hyperglycemia: Secondary | ICD-10-CM

## 2019-03-16 NOTE — Progress Notes (Addendum)
Patient ID: Ashley Armstrong, female   DOB: January 31, 1966, 53 y.o.   MRN: [id number]          Reason for Appointment: Follow-up for Type 1 Diabetes  Referring physician: Dr. [name]   Today's office visit was provided via telemedicine using video technique Explained to the patient and the the limitations of evaluation and management by telemedicine and the availability of in person appointments.  The patient understood the limitations and agreed to proceed. Patient also understood that the telehealth visit is billable. . Location of the patient: Home . Location of the provider: Office Only the patient and myself were participating in the encounter    History of Present Illness:          Date of diagnosis of type 1 diabetes mellitus: Age 45       Background history:  She has been on insulin practically since her diagnosis was made.  Initially she had symptoms of weight loss and blurred vision For several years has been on Lantus and NovoLog but usually has had poor control with A1c mostly between 8-10%  Recent history:   Most recent A1c is 10.1   INSULIN regimen is:  Lantus 30 units twice a day, breakfast and pm.  NovoLog mostly 15-20 units before meals       Current management, blood sugar patterns and problems identified:  She was told to increase her Lantus by 6 units and reduce evening dose by 4 units but she has only changed her doses by 1 unit  Difficult to know what her blood sugar patterns are as she does not know how to upload her freestyle libre  Also she has a regular mealtimes and not clear which readings are after meals  Although her A1c was over 10% previously her average blood sugar for 30 days is reportedly around 217 according to the patient  She thinks her HIGHEST blood sugars are after her meal in the mid afternoon around 3 PM  However not clear if she is monitoring or recording her sugars after evening meal  She has been reportedly told to try and  estimate her mealtime coverage based on meal size and carbohydrate intake and use a carbohydrate ratio of 1: 3 but she is still not doing this  She is mostly adjusting her NovoLog based on pre-meal blood sugar and ranging from 15-20  However overall taking more NovoLog than she used to  She still has tendency to occasionally getting low blood sugars but recently documented only once early morning and not recently getting low sugars overnight  She cannot explain why a couple of days her sugars were close to 400  She has had more stress again causing her sugars to be higher           Meal times are:  Breakfast is 7 AM at Lunch: 12 PM dinner: 5 PM  Typical meal intake: Breakfast is sometimes high fat but otherwise eggs and toast.  Lunch usually a mixed meal and evening meal may be a sandwich with bland food.  Snacks will be cheese and crackers Usually avoiding drinks with sugar             Exercise: None    Glucose monitoring:  done 4 times a day         Glucometer:  Freestyle libre.       Blood Glucose readings by time of day and averages from record   PRE-MEAL Fasting Lunch Dinner Bedtime Overall  Glucose range:  68-293  115-370   236   Mean/median:      217   POST-MEAL PC Breakfast PC Lunch PC Dinner  Glucose range:   111-248  110  Mean/median:      Previous readings:  PRE-MEAL Fasting Lunch Dinner Bedtime Overall  Glucose range:       Mean/median:  212  232  225  191 221   POST-MEAL PC Breakfast PC Lunch PC Dinner  Glucose range:     Mean/median:   320  178   Dietician visit, most recent: 11/18  Weight history:  Wt Readings from Last 3 Encounters:  03/08/19 136 lb (61.7 kg)  11/26/18 136 lb 3.2 oz (61.8 kg)  10/05/18 135 lb (61.2 kg)    Glycemic control:   Lab Results  Component Value Date   HGBA1C 10.1 (H) 12/17/2018   HGBA1C 8.6 (A) 11/26/2018   HGBA1C 8.9 (A) 07/31/2018   Lab Results  Component Value Date   MICROALBUR 6.1 (H) 07/31/2018   LDLCALC  109 (H) 12/17/2018   CREATININE 1.06 (H) 12/17/2018   Lab Results  Component Value Date   MICRALBCREAT 4.3 07/31/2018    No results found for: FRUCTOSAMINE  No visits with results within 1 Week(s) from this visit.  Latest known visit with results is:  Telephone on 12/17/2018  Component Date Value Ref Range Status  . Glucose, Bld 12/17/2018 177* 65 - 99 mg/dL Final   Comment: .            Fasting reference interval . For someone without known diabetes, a glucose value >125 mg/dL indicates that they may have diabetes and this should be confirmed with a follow-up test. .   . BUN 12/17/2018 18  7 - 25 mg/dL Final  . Creat 12/17/2018 1.06* 0.50 - 1.05 mg/dL Final   Comment: For patients >2 years of age, the reference limit for Creatinine is approximately 13% higher for people identified as African-American. .   . GFR, Est Non African American 12/17/2018 61  > OR = 60 mL/min/1.39m Final  . GFR, Est African American 12/17/2018 70  > OR = 60 mL/min/1.765mFinal  . BUN/Creatinine Ratio 12/17/2018 17  6 - 22 (calc) Final  . Sodium 12/17/2018 140  135 - 146 mmol/L Final  . Potassium 12/17/2018 4.8  3.5 - 5.3 mmol/L Final  . Chloride 12/17/2018 104  98 - 110 mmol/L Final  . CO2 12/17/2018 29  20 - 32 mmol/L Final  . Calcium 12/17/2018 9.5  8.6 - 10.4 mg/dL Final  . Total Protein 12/17/2018 6.7  6.1 - 8.1 g/dL Final  . Albumin 12/17/2018 4.0  3.6 - 5.1 g/dL Final  . Globulin 12/17/2018 2.7  1.9 - 3.7 g/dL (calc) Final  . AG Ratio 12/17/2018 1.5  1.0 - 2.5 (calc) Final  . Total Bilirubin 12/17/2018 0.3  0.2 - 1.2 mg/dL Final  . Alkaline phosphatase (APISO) 12/17/2018 98  37 - 153 U/L Final  . AST 12/17/2018 17  10 - 35 U/L Final  . ALT 12/17/2018 14  6 - 29 U/L Final  . Vit D, 25-Hydroxy 12/17/2018 36  30 - 100 ng/mL Final   Comment: Vitamin D Status         25-OH Vitamin D: . Deficiency:                    <20 ng/mL Insufficiency:             20 -  29 ng/mL Optimal:                  > or = 30 ng/mL . For 25-OH Vitamin D testing on patients on  D2-supplementation and patients for whom quantitation  of D2 and D3 fractions is required, the QuestAssureD(TM) 25-OH VIT D, (D2,D3), LC/MS/MS is recommended: order  code [id number] (patients >66yr). . For more information on this test, go to: http://education.questdiagnostics.com/faq/FAQ163 (This link is being provided for  informational/educational purposes only.)   . Cholesterol 12/17/2018 196  <200 mg/dL Final  . HDL 12/17/2018 67  > OR = 50 mg/dL Final  . Triglycerides 12/17/2018 105  <150 mg/dL Final  . LDL Cholesterol (Calc) 12/17/2018 109* mg/dL (calc) Final   Comment: Reference range: <100 . Desirable range <100 mg/dL for primary prevention;   <70 mg/dL for patients with CHD or diabetic patients  with > or = 2 CHD risk factors. .Marland KitchenLDL-C is now calculated using the Martin-Hopkins  calculation, which is a validated novel method providing  better accuracy than the Friedewald equation in the  estimation of LDL-C.  MCresenciano Genreet al. JAnnamaria Helling 20940;768(08: [id number]  (http://education.[facility].com/faq/FAQ164)   . Total CHOL/HDL Ratio 12/17/2018 2.9  <5.0 (calc) Final  . Non-HDL Cholesterol (Calc) 12/17/2018 129  <130 mg/dL (calc) Final   Comment: For patients with diabetes plus 1 major ASCVD risk  factor, treating to a non-HDL-C goal of <100 mg/dL  (LDL-C of <70 mg/dL) is considered a therapeutic  option.   . Hgb A1c MFr Bld 12/17/2018 10.1* <5.7 % of total Hgb Final   Comment: For someone without known diabetes, a hemoglobin A1c value of 6.5% or greater indicates that they may have  diabetes and this should be confirmed with a follow-up  test. . For someone with known diabetes, a value <7% indicates  that their diabetes is well controlled and a value  greater than or equal to 7% indicates suboptimal  control. A1c targets should be individualized based on  duration of diabetes, age, comorbid conditions,  and  other considerations. . Currently, no consensus exists regarding use of hemoglobin A1c for diagnosis of diabetes for children. .     Allergies as of 03/16/2019   No Known Allergies     Medication List       Accurate as of Mar 16, 2019 10:02 AM. Always use your most recent med list.        busPIRone 10 MG tablet Commonly known as:  BUSPAR Take 5 mg by mouth 2 (two) times daily.   calcium-vitamin D 500-200 MG-UNIT Tabs tablet Commonly known as:  OSCAL WITH D Take 1 tablet by mouth daily.   Durezol 0.05 % Emul Generic drug:  Difluprednate Apply 1 drop to eye daily at 8 pm.   Fish Oil 1000 MG Caps Take 1 capsule by mouth daily.   FreeStyle Libre 14 Day Reader DKerrin Mo1 each by Other route See admin instructions. Change sensor every 2 weeks   FreeStyle Libre 14 Day Sensor Misc USE AS DIRECTED EVERY 14 DAYS.   glucose blood test strip Commonly known as:  ONE TOUCH ULTRA TEST TEST FOUR TIMES A DAY AS DIRECTED.   Insulin Glargine 100 UNIT/ML Solostar Pen Commonly known as:  Lantus SoloStar Inject 31 units in the morning and 21 at  6 in the evening   Insulin Pen Needle 32G X 4 MM Misc Commonly known as:  Sure Comfort Pen Needles Use daily with insulin 4 times a day  lisinopril 2.5 MG tablet Commonly known as:  ZESTRIL TAKE ONE (1) TABLET BY MOUTH EVERY DAY   metoCLOPramide 5 MG tablet Commonly known as:  REGLAN TAKE ONE TO TWO TABLETS BY MOUTH EVERY SIX HOURS AS NEEDED FOR NAUSEAOR VOMITING   mirtazapine 30 MG tablet Commonly known as:  REMERON Take 1 tablet by mouth daily at 8 pm.   montelukast 10 MG tablet Commonly known as:  SINGULAIR Take 1 tablet (10 mg total) by mouth at bedtime.   NovoLOG FlexPen 100 UNIT/ML FlexPen Generic drug:  insulin aspart INJECT 8 TO 12 UNITS SUBCUTANEOUSLY BEFORE EACH MEAL AS DIRECTED. ADJUST PERSLIDING SCALE DEPENDING ON SUGAR READING. MAX DOSE PER DAY IS 51   omeprazole 40 MG capsule Commonly known as:  PRILOSEC TAKE  ONE CAPSULE BY MOUTH 30 MINUTES PRIOR TO BREAKFAST AND SUPPER   ondansetron 4 MG tablet Commonly known as:  ZOFRAN TAKE ONE TABLET BY MOUTH EVERY EIGHT HOURS AS NEEDED FOR NAUSEA OR VOMITING   ONE TOUCH ULTRA MINI w/Device Kit Use as directed   OneTouch Delica Lancets 02M Misc USE FOUR TIMES A DAY AS DIRECTED.   polyethylene glycol 17 g packet Commonly known as:  MIRALAX / GLYCOLAX Take 17 g by mouth daily as needed.   pravastatin 80 MG tablet Commonly known as:  PRAVACHOL Take 1 tablet (80 mg total) by mouth every evening.   Valium 10 MG tablet Generic drug:  diazepam Take one (1) Tablet Once a Day, As Necessary for panic   vitamin B-12 100 MCG tablet Commonly known as:  CYANOCOBALAMIN Take 1 tablet by mouth daily.       Allergies: No Known Allergies  Past Medical History:  Diagnosis Date  . Anxiety   . Dyslipidemia   . Gastroparesis due to DM ([place]) OCT 2014   75% AT 2 HRS, GLU >   . H/O eye surgery 01/2018  . Headache(784.0)   . IDDM (insulin dependent diabetes mellitus) ([place])   . Neuropathy   . Nicotine addiction   . TIA (transient ischemic attack)    Dr. Merlene Laughter    Past Surgical History:  Procedure Laterality Date  . Cataract surgery Bilateral 01/20/2017, 02/03/2017  . COLONOSCOPY N/A 05/09/2016   Procedure: COLONOSCOPY;  Surgeon: Danie Binder, MD;  Location: [facility];  Service: Endoscopy;  Laterality: N/A;  130  . ESOPHAGOGASTRODUODENOSCOPY N/A 08/31/2013   SLF: 1. Earky satiety nausea may be due to Gastroparesis/pyloric channel stenosis. 2. small hiatal hernia 3. Moderate erosive gastritis.   Marland Kitchen EYE SURGERY  01/2018  . laser surgery bilateral eye      Family History  Problem Relation Age of Onset  . Hypertension Mother   . Diabetes Mother   . Hyperlipidemia Mother   . Rosacea Mother   . Hypertension Father   . Diabetes Brother   . Breast cancer Maternal Aunt   . Colon cancer Neg Hx     Social History:  reports that she quit smoking  about 7 years ago. Her smoking use included cigarettes. She has a 36.00 pack-year smoking history. She has never used smokeless tobacco. She reports that she does not drink alcohol or use drugs.   Review of Systems   Lipid history: Followed by PCP, currently on pravastatin 80 mg Labs as follows    Lab Results  Component Value Date   CHOL 196 12/17/2018   HDL 67 12/17/2018   LDLCALC 109 (H) 12/17/2018   TRIG 105 12/17/2018   CHOLHDL 2.9 12/17/2018  Hypertension: Recently well controlled  BP Readings from Last 3 Encounters:  03/08/19 114/66  11/26/18 102/62  10/05/18 110/60    Most recent eye exam was in 5/19  Most recent foot exam: 9/19  GASTROPARESIS: Overall her nausea is better but recently at times will still have some nausea and vomiting Followed by gastroenterologist now She is trying to take her metoclopramide before eating most of the time  Currently known complications of diabetes: Gastroparesis, neuropathy, retinopathy, no nephropathy    LABS:  No visits with results within 1 Week(s) from this visit.  Latest known visit with results is:  Telephone on 12/17/2018  Component Date Value Ref Range Status  . Glucose, Bld 12/17/2018 177* 65 - 99 mg/dL Final   Comment: .            Fasting reference interval . For someone without known diabetes, a glucose value >125 mg/dL indicates that they may have diabetes and this should be confirmed with a follow-up test. .   . BUN 12/17/2018 18  7 - 25 mg/dL Final  . Creat 12/17/2018 1.06* 0.50 - 1.05 mg/dL Final   Comment: For patients >37 years of age, the reference limit for Creatinine is approximately 13% higher for people identified as African-American. .   . GFR, Est Non African American 12/17/2018 61  > OR = 60 mL/min/1.52m Final  . GFR, Est African American 12/17/2018 70  > OR = 60 mL/min/1.757mFinal  . BUN/Creatinine Ratio 12/17/2018 17  6 - 22 (calc) Final  . Sodium 12/17/2018 140  135 - 146  mmol/L Final  . Potassium 12/17/2018 4.8  3.5 - 5.3 mmol/L Final  . Chloride 12/17/2018 104  98 - 110 mmol/L Final  . CO2 12/17/2018 29  20 - 32 mmol/L Final  . Calcium 12/17/2018 9.5  8.6 - 10.4 mg/dL Final  . Total Protein 12/17/2018 6.7  6.1 - 8.1 g/dL Final  . Albumin 12/17/2018 4.0  3.6 - 5.1 g/dL Final  . Globulin 12/17/2018 2.7  1.9 - 3.7 g/dL (calc) Final  . AG Ratio 12/17/2018 1.5  1.0 - 2.5 (calc) Final  . Total Bilirubin 12/17/2018 0.3  0.2 - 1.2 mg/dL Final  . Alkaline phosphatase (APISO) 12/17/2018 98  37 - 153 U/L Final  . AST 12/17/2018 17  10 - 35 U/L Final  . ALT 12/17/2018 14  6 - 29 U/L Final  . Vit D, 25-Hydroxy 12/17/2018 36  30 - 100 ng/mL Final   Comment: Vitamin D Status         25-OH Vitamin D: . Deficiency:                    <20 ng/mL Insufficiency:             20 - 29 ng/mL Optimal:                 > or = 30 ng/mL . For 25-OH Vitamin D testing on patients on  D2-supplementation and patients for whom quantitation  of D2 and D3 fractions is required, the QuestAssureD(TM) 25-OH VIT D, (D2,D3), LC/MS/MS is recommended: order  code [id number] >2y16yr . For more information on this test, go to: http://education.quest[facility].com/faq/FAQ163 (This link is being provided for  informational/educational purposes only.)   . Cholesterol 12/17/2018 196  <200 mg/dL Final  . HDL 12/17/2018 67  > OR = 50 mg/dL Final  . Triglycerides 12/17/2018 105  <150 mg/dL Final  . LDL Cholesterol (Calc) 12/17/2018 109*  mg/dL (calc) Final   Comment: Reference range: <100 . Desirable range <100 mg/dL for primary prevention;   <70 mg/dL for patients with CHD or diabetic patients  with > or = 2 CHD risk factors. Marland Kitchen LDL-C is now calculated using the Martin-Hopkins  calculation, which is a validated novel method providing  better accuracy than the Friedewald equation in the  estimation of LDL-C.  Cresenciano Genre et al. Annamaria Helling. 7262;035(59): 2061-2068   (http://education.[facility].com/faq/FAQ164)   . Total CHOL/HDL Ratio 12/17/2018 2.9  <5.0 (calc) Final  . Non-HDL Cholesterol (Calc) 12/17/2018 129  <130 mg/dL (calc) Final   Comment: For patients with diabetes plus 1 major ASCVD risk  factor, treating to a non-HDL-C goal of <100 mg/dL  (LDL-C of <70 mg/dL) is considered a therapeutic  option.   . Hgb A1c MFr Bld 12/17/2018 10.1* <5.7 % of total Hgb Final   Comment: For someone without known diabetes, a hemoglobin A1c value of 6.5% or greater indicates that they may have  diabetes and this should be confirmed with a follow-up  test. . For someone with known diabetes, a value <7% indicates  that their diabetes is well controlled and a value  greater than or equal to 7% indicates suboptimal  control. A1c targets should be individualized based on  duration of diabetes, age, comorbid conditions, and  other considerations. . Currently, no consensus exists regarding use of hemoglobin A1c for diagnosis of diabetes for children. Marland Kitchen     Physical Examination:  LMP 07/26/2013        ASSESSMENT:  Diabetes type 1, poorly controlled with multiple complications  Last R4B was 10.1  See history of present illness for detailed discussion of current diabetes management, blood sugar patterns and problems identified   Her blood sugars are difficult to assess since her freestyle Elenor Legato was not downloaded No consistent pattern seen However day-to-day management of her diabetes was discussed especially mealtime insulin coverage Since she is reporting higher readings after her main meal in the mid afternoon she likely will need to take about 25 units at that meal Again discussed basing this on carbohydrate content and trying to use 1: 3 ratio  Also discussed use of the insulin pump and potential advantages of this to prevent hypoglycemia and hypoglycemia She is still reluctant to change her ways and how she is doing her insulin  Currently taking a symmetrical dose of Lantus and this may be reasonably okay for her to continue  GASTROPARESIS: Has had overall her symptoms and reminded her to take her metoclopramide 30 minutes before eating every meal  PLAN:    No change in Lantus as yet but may need reduced evening dose if getting overnight hypoglycemia  She needs to check readings after meals consistently especially after evening meal  As above.  Needs to increase her coverage for her midafternoon meals and target her postprandial readings under 180  She will try to start carbohydrate counting and also see the dietitian on her next visit  Call if having any hypoglycemia  Will discuss insulin pump again on the next visit  She will also need to be instructed on how to upload her freestyle libre for online access  She will follow-up in 2 months for A1c   Counseling time on subjects discussed in assessment and plan sections is over 50% of today's 25 minute visit  Elayne Snare 03/16/2019, 10:02 AM   Note: This office note was prepared with Dragon voice recognition system technology. Any transcriptional errors that  result from this process are unintentional.

## 2019-03-17 ENCOUNTER — Other Ambulatory Visit: Payer: Self-pay

## 2019-03-17 ENCOUNTER — Ambulatory Visit: Payer: PPO | Admitting: Family Medicine

## 2019-03-18 ENCOUNTER — Encounter: Payer: Self-pay | Admitting: Family Medicine

## 2019-03-18 ENCOUNTER — Ambulatory Visit (INDEPENDENT_AMBULATORY_CARE_PROVIDER_SITE_OTHER): Payer: PPO | Admitting: Family Medicine

## 2019-03-18 VITALS — BP 114/66 | Ht 68.0 in | Wt 135.0 lb

## 2019-03-18 DIAGNOSIS — Z0000 Encounter for general adult medical examination without abnormal findings: Secondary | ICD-10-CM

## 2019-03-18 DIAGNOSIS — F410 Panic disorder [episodic paroxysmal anxiety] without agoraphobia: Secondary | ICD-10-CM | POA: Diagnosis not present

## 2019-03-18 NOTE — Patient Instructions (Signed)
Ashley Armstrong , Thank you for taking time to come for your Medicare Wellness Visit. I appreciate your ongoing commitment to your health goals. Please review the following plan we discussed and let me know if I can assist you in the future.   Screening recommendations/referrals: Colonoscopy: up to date Mammogram: up to date Bone Density: due at 65 Recommended yearly ophthalmology/optometry visit for glaucoma screening and checkup Recommended yearly dental visit for hygiene and checkup  Vaccinations: Influenza vaccine: due in Fall 2020 Pneumococcal vaccine: up to date Tdap vaccine: up to date Shingles vaccine: Due at 42  Preventive Care 40-64 Years, Female Preventive care refers to lifestyle choices and visits with your health care provider that can promote health and wellness. What does preventive care include?  A yearly physical exam. This is also called an annual well check.  Dental exams once or twice a year.  Routine eye exams. Ask your health care provider how often you should have your eyes checked.  Personal lifestyle choices, including:  Daily care of your teeth and gums.  Regular physical activity.  Eating a healthy diet.  Avoiding tobacco and drug use.  Limiting alcohol use.  Practicing safe sex.  Taking low-dose aspirin daily starting at age 25.  Taking vitamin and mineral supplements as recommended by your health care provider. What happens during an annual well check? The services and screenings done by your health care provider during your annual well check will depend on your age, overall health, lifestyle risk factors, and family history of disease. Counseling  Your health care provider may ask you questions about your:  Alcohol use.  Tobacco use.  Drug use.  Emotional well-being.  Home and relationship well-being.  Sexual activity.  Eating habits.  Work and work Statistician.  Method of birth control.  Menstrual cycle.  Pregnancy history.  Screening  You may have the following tests or measurements:  Height, weight, and BMI.  Blood pressure.  Lipid and cholesterol levels. These may be checked every 5 years, or more frequently if you are over 53 years old.  Skin check.  Lung cancer screening. You may have this screening every year starting at age 50 if you have a 30-pack-year history of smoking and currently smoke or have quit within the past 15 years.  Fecal occult blood test (FOBT) of the stool. You may have this test every year starting at age 59.  Flexible sigmoidoscopy or colonoscopy. You may have a sigmoidoscopy every 5 years or a colonoscopy every 10 years starting at age 27.  Hepatitis C blood test.  Hepatitis B blood test.  Sexually transmitted disease (STD) testing.  Diabetes screening. This is done by checking your blood sugar (glucose) after you have not eaten for a while (fasting). You may have this done every 1-3 years.  Mammogram. This may be done every 1-2 years. Talk to your health care provider about when you should start having regular mammograms. This may depend on whether you have a family history of breast cancer.  BRCA-related cancer screening. This may be done if you have a family history of breast, ovarian, tubal, or peritoneal cancers.  Pelvic exam and Pap test. This may be done every 3 years starting at age 34. Starting at age 44, this may be done every 5 years if you have a Pap test in combination with an HPV test.  Bone density scan. This is done to screen for osteoporosis. You may have this scan if you are at high risk for  osteoporosis. Discuss your test results, treatment options, and if necessary, the need for more tests with your health care provider. Vaccines  Your health care provider may recommend certain vaccines, such as:  Influenza vaccine. This is recommended every year.  Tetanus, diphtheria, and acellular pertussis (Tdap, Td) vaccine. You may need a Td booster every 10  years.  Zoster vaccine. You may need this after age 32.  Pneumococcal 13-valent conjugate (PCV13) vaccine. You may need this if you have certain conditions and were not previously vaccinated.  Pneumococcal polysaccharide (PPSV23) vaccine. You may need one or two doses if you smoke cigarettes or if you have certain conditions. Talk to your health care provider about which screenings and vaccines you need and how often you need them. This information is not intended to replace advice given to you by your health care provider. Make sure you discuss any questions you have with your health care provider. Document Released: 11/24/2015 Document Revised: 07/17/2016 Document Reviewed: 08/29/2015 Elsevier Interactive Patient Education  2017 [employer] Prevention in the Home Falls can cause injuries. They can happen to people of all ages. There are many things you can do to make your home safe and to help prevent falls. What can I do on the outside of my home?  Regularly fix the edges of walkways and driveways and fix any cracks.  Remove anything that might make you trip as you walk through a door, such as a raised step or threshold.  Trim any bushes or trees on the path to your home.  Use bright outdoor lighting.  Clear any walking paths of anything that might make someone trip, such as rocks or tools.  Regularly check to see if handrails are loose or broken. Make sure that both sides of any steps have handrails.  Any raised decks and porches should have guardrails on the edges.  Have any leaves, snow, or ice cleared regularly.  Use sand or salt on walking paths during winter.  Clean up any spills in your garage right away. This includes oil or grease spills. What can I do in the bathroom?  Use night lights.  Install grab bars by the toilet and in the tub and shower. Do not use towel bars as grab bars.  Use non-skid mats or decals in the tub or shower.  If you need to  sit down in the shower, use a plastic, non-slip stool.  Keep the floor dry. Clean up any water that spills on the floor as soon as it happens.  Remove soap buildup in the tub or shower regularly.  Attach bath mats securely with double-sided non-slip rug tape.  Do not have throw rugs and other things on the floor that can make you trip. What can I do in the bedroom?  Use night lights.  Make sure that you have a light by your bed that is easy to reach.  Do not use any sheets or blankets that are too big for your bed. They should not hang down onto the floor.  Have a firm chair that has side arms. You can use this for support while you get dressed.  Do not have throw rugs and other things on the floor that can make you trip. What can I do in the kitchen?  Clean up any spills right away.  Avoid walking on wet floors.  Keep items that you use a lot in easy-to-reach places.  If you need to reach something above you,  use a strong step stool that has a grab bar.  Keep electrical cords out of the way.  Do not use floor polish or wax that makes floors slippery. If you must use wax, use non-skid floor wax.  Do not have throw rugs and other things on the floor that can make you trip. What can I do with my stairs?  Do not leave any items on the stairs.  Make sure that there are handrails on both sides of the stairs and use them. Fix handrails that are broken or loose. Make sure that handrails are as long as the stairways.  Check any carpeting to make sure that it is firmly attached to the stairs. Fix any carpet that is loose or worn.  Avoid having throw rugs at the top or bottom of the stairs. If you do have throw rugs, attach them to the floor with carpet tape.  Make sure that you have a light switch at the top of the stairs and the bottom of the stairs. If you do not have them, ask someone to add them for you. What else can I do to help prevent falls?  Wear shoes that:  Do not  have high heels.  Have rubber bottoms.  Are comfortable and fit you well.  Are closed at the toe. Do not wear sandals.  If you use a stepladder:  Make sure that it is fully opened. Do not climb a closed stepladder.  Make sure that both sides of the stepladder are locked into place.  Ask someone to hold it for you, if possible.  Clearly mark and make sure that you can see:  Any grab bars or handrails.  First and last steps.  Where the edge of each step is.  Use tools that help you move around (mobility aids) if they are needed. These include:  Canes.  Walkers.  Scooters.  Crutches.  Turn on the lights when you go into a dark area. Replace any light bulbs as soon as they burn out.  Set up your furniture so you have a clear path. Avoid moving your furniture around.  If any of your floors are uneven, fix them.  If there are any pets around you, be aware of where they are.  Review your medicines with your doctor. Some medicines can make you feel dizzy. This can increase your chance of falling. Ask your doctor what other things that you can do to help prevent falls. This information is not intended to replace advice given to you by your health care provider. Make sure you discuss any questions you have with your health care provider. Document Released: 08/24/2009 Document Revised: 04/04/2016 Document Reviewed: 12/02/2014 Elsevier Interactive Patient Education  2017 [employer].

## 2019-03-18 NOTE — Progress Notes (Signed)
Subjective:   Ashley Armstrong is a 53 y.o. female who presents for Medicare Annual (Subsequent) preventive examination.  Location of Patient: Home Location of Provider: Telehealth Consent was obtain for visit to be over via telehealth. I verified that I am speaking with the correct person using two identifiers.   Review of Systems:    Cardiac Risk Factors include: diabetes mellitus     Objective:     Vitals: BP 114/66   Ht '5\' 8"'  (1.727 m)   Wt 135 lb (61.2 kg)   LMP 07/26/2013   BMI 20.53 kg/m   Body mass index is 20.53 kg/m.  Advanced Directives 05/18/2018 03/11/2018 02/12/2017 11/05/2016 05/09/2016 07/27/2015 04/08/2015  Does Patient Have a Medical Advance Directive? No No No No No No No  Would patient like information on creating a medical advance directive? - No - Patient declined Yes (MAU/Ambulatory/Procedural Areas - Information given) No - Patient declined No - patient declined information - No - patient declined information  Pre-existing out of facility DNR order (yellow form or pink MOST form) - - - - - - -    Tobacco Social History   Tobacco Use  Smoking Status Former Smoker  . Packs/day: 1.50  . Years: 24.00  . Pack years: 36.00  . Types: Cigarettes  . Last attempt to quit: 08/24/2011  . Years since quitting: 7.5  Smokeless Tobacco Never Used     Counseling given: Yes   Clinical Intake:  Pre-visit preparation completed: Yes  Pain : No/denies pain     Nutritional Status: BMI of 19-24  Normal Nutritional Risks: None Diabetes: Yes CBG done?: No Did pt. bring in CBG monitor from home?: No  How often do you need to have someone help you when you read instructions, pamphlets, or other written materials from your doctor or pharmacy?: 1 - Never What is the last grade level you completed in school?: 9  Interpreter Needed?: No     Past Medical History:  Diagnosis Date  . Anxiety   . Dyslipidemia   . Gastroparesis due to DM ([place]) OCT 2014   75% AT 2  HRS, GLU >   . H/O eye surgery 01/2018  . Headache(784.0)   . IDDM (insulin dependent diabetes mellitus) ([place])   . Neuropathy   . Nicotine addiction   . TIA (transient ischemic attack)    Dr. Merlene Laughter   Past Surgical History:  Procedure Laterality Date  . Cataract surgery Bilateral 01/20/2017, 02/03/2017  . COLONOSCOPY N/A 05/09/2016   Procedure: COLONOSCOPY;  Surgeon: Danie Binder, MD;  Location: [facility];  Service: Endoscopy;  Laterality: N/A;  130  . ESOPHAGOGASTRODUODENOSCOPY N/A 08/31/2013   SLF: 1. Earky satiety nausea may be due to Gastroparesis/pyloric channel stenosis. 2. small hiatal hernia 3. Moderate erosive gastritis.   Marland Kitchen EYE SURGERY  01/2018  . laser surgery bilateral eye     Family History  Problem Relation Age of Onset  . Hypertension Mother   . Diabetes Mother   . Hyperlipidemia Mother   . Rosacea Mother   . Hypertension Father   . Diabetes Brother   . Breast cancer Maternal Aunt   . Colon cancer Neg Hx    Social History   Socioeconomic History  . Marital status: Single    Spouse name: Not on file  . Number of children: Not on file  . Years of education: Not on file  . Highest education level: Not on file  Occupational History  . Occupation:  disabled  Social Needs  . Financial resource strain: Not very hard  . Food insecurity:    Worry: Sometimes true    Inability: Sometimes true  . Transportation needs:    Medical: No    Non-medical: No  Tobacco Use  . Smoking status: Former Smoker    Packs/day: 1.50    Years: 24.00    Pack years: 36.00    Types: Cigarettes    Last attempt to quit: 08/24/2011    Years since quitting: 7.5  . Smokeless tobacco: Never Used  Substance and Sexual Activity  . Alcohol use: No  . Drug use: No  . Sexual activity: Not Currently    Birth control/protection: None  Lifestyle  . Physical activity:    Days per week: 2 days    Minutes per session: 20 min  . Stress: Very much  Relationships  . Social  connections:    Talks on phone: More than three times a week    Gets together: More than three times a week    Attends religious service: More than 4 times per year    Active member of club or organization: No    Attends meetings of clubs or organizations: Never    Relationship status: Never married  Other Topics Concern  . Not on file  Social History Narrative   Disabled, lives with a room mate    Outpatient Encounter Medications as of 03/18/2019  Medication Sig  . Blood Glucose Monitoring Suppl (ONE TOUCH ULTRA MINI) w/Device KIT Use as directed  . busPIRone (BUSPAR) 10 MG tablet Take 5 mg by mouth 2 (two) times daily.  . calcium-vitamin D (OSCAL WITH D) 500-200 MG-UNIT TABS tablet Take 1 tablet by mouth daily.  . Continuous Blood Gluc Receiver (FREESTYLE LIBRE 14 DAY READER) DEVI 1 each by Other route See admin instructions. Change sensor every 2 weeks  . Continuous Blood Gluc Sensor (FREESTYLE LIBRE 14 DAY SENSOR) MISC USE AS DIRECTED EVERY 14 DAYS.  Marland Kitchen diazepam (VALIUM) 10 MG tablet Take one (1) Tablet Once a Day, As Necessary for panic  . DUREZOL 0.05 % EMUL Apply 1 drop to eye daily at 8 pm.  . glucose blood (ONE TOUCH ULTRA TEST) test strip TEST FOUR TIMES A DAY AS DIRECTED.  . Insulin Glargine (LANTUS SOLOSTAR) 100 UNIT/ML Solostar Pen Inject 31 units in the morning and 21 at  6 in the evening  . Insulin Pen Needle (SURE COMFORT PEN NEEDLES) 32G X 4 MM MISC Use daily with insulin 4 times a day  . lisinopril (ZESTRIL) 2.5 MG tablet TAKE ONE (1) TABLET BY MOUTH EVERY DAY  . metoCLOPramide (REGLAN) 5 MG tablet TAKE ONE TO TWO TABLETS BY MOUTH EVERY SIX HOURS AS NEEDED FOR NAUSEAOR VOMITING  . mirtazapine (REMERON) 30 MG tablet Take 1 tablet by mouth daily at 8 pm.  . montelukast (SINGULAIR) 10 MG tablet Take 1 tablet (10 mg total) by mouth at bedtime.  Marland Kitchen NOVOLOG FLEXPEN 100 UNIT/ML FlexPen INJECT 8 TO 12 UNITS SUBCUTANEOUSLY BEFORE EACH MEAL AS DIRECTED. ADJUST PERSLIDING SCALE  DEPENDING ON SUGAR READING. MAX DOSE PER DAY IS 51  . Omega-3 Fatty Acids (FISH OIL) 1000 MG CAPS Take 1 capsule by mouth daily.  Marland Kitchen omeprazole (PRILOSEC) 40 MG capsule TAKE ONE CAPSULE BY MOUTH 30 MINUTES PRIOR TO BREAKFAST AND SUPPER  . ondansetron (ZOFRAN) 4 MG tablet TAKE ONE TABLET BY MOUTH EVERY EIGHT HOURS AS NEEDED FOR NAUSEA OR VOMITING  . ONETOUCH DELICA LANCETS 37T  MISC USE FOUR TIMES A DAY AS DIRECTED.  Marland Kitchen polyethylene glycol (MIRALAX / GLYCOLAX) 17 g packet Take 17 g by mouth daily as needed.  . pravastatin (PRAVACHOL) 80 MG tablet Take 1 tablet (80 mg total) by mouth every evening.  . vitamin B-12 (CYANOCOBALAMIN) 100 MCG tablet Take 1 tablet by mouth daily.   No facility-administered encounter medications on file as of 03/18/2019.     Activities of Daily Living In your present state of health, do you have any difficulty performing the following activities: 03/18/2019  Hearing? N  Vision? N  Difficulty concentrating or making decisions? N  Walking or climbing stairs? Y  Dressing or bathing? N  Doing errands, shopping? Y  Comment parents go with her due to anxiety and panic attacks  Preparing Food and eating ? N  Using the Toilet? N  In the past six months, have you accidently leaked urine? N  Do you have problems with loss of bowel control? N  Managing your Medications? N  Housekeeping or managing your Housekeeping? Y  Comment parents help sometimes  Some recent data might be hidden    Patient Care Team: Fayrene Helper, MD as PCP - [facility], Marga Melnick, MD as Consulting Physician (Gastroenterology) Sherlynn Stalls, MD as Consulting Physician (Ophthalmology) Darleen Crocker, MD as Consulting Physician (Ophthalmology) Herminio Commons, MD as Attending Physician (Cardiology) Landis Martins, DPM as Consulting Physician (Podiatry)    Assessment:   This is a routine wellness examination for Sujata.  Exercise Activities and Dietary recommendations Current  Exercise Habits: The patient does not participate in regular exercise at present, Exercise limited by: None identified  Goals    . Exercise 3x per week (30 min per time)     Recommend starting a routine exercise program at least 3 days a week for 30-45 minutes at a time as tolerated.         Fall Risk Fall Risk  03/18/2019 10/05/2018 06/02/2018 03/11/2018 07/16/2017  Falls in the past year? 0 0 No No No  Number falls in past yr: - 0 - - -  Injury with Fall? 0 0 - - -   Is the patient's home free of loose throw rugs in walkways, pet beds, electrical cords, etc?   yes      Grab bars in the bathroom? no      Handrails on the stairs?   no stairs       Adequate lighting?   yes   Timed Get Up and Go performed:  N/a  Depression Screen PHQ 2/9 Scores 03/18/2019 10/05/2018 06/02/2018 03/11/2018  PHQ - 2 Score '2 2 4 5  ' PHQ- 9 Score '15 18 18 15     ' Cognitive Function     6CIT Screen 03/18/2019 03/11/2018 02/12/2017  What Year? 0 points 0 points 0 points  What month? 0 points 0 points 0 points  What time? 0 points 0 points 0 points  Count back from 20 0 points 0 points 0 points  Months in reverse 0 points 0 points 0 points  Repeat phrase 0 points 0 points 0 points  Total Score 0 0 0    Immunization History  Administered Date(s) Administered  . H1N1 10/24/2008  . Influenza Whole 09/24/2007, 08/09/2010  . Influenza,inj,Quad PF,6+ Mos 07/29/2013, 10/18/2014, 08/29/2015, 09/23/2016, 07/16/2017, 10/05/2018  . Influenza-Unspecified 08/08/2017  . Pneumococcal Polysaccharide-23 03/19/2013, 06/02/2018  . Td 04/11/2004  . Tdap 06/16/2014    Qualifies for Shingles Vaccine? Discussed with Dr Moshe Cipro  Screening Tests Health Maintenance  Topic Date Due  . OPHTHALMOLOGY EXAM  01/02/2019  . INFLUENZA VACCINE  06/12/2019  . HEMOGLOBIN A1C  06/17/2019  . FOOT EXAM  08/01/2019  . MAMMOGRAM  07/24/2020  . PAP SMEAR-Modifier  06/02/2021  . TETANUS/TDAP  06/16/2024  . COLONOSCOPY  05/09/2026  .  PNEUMOCOCCAL POLYSACCHARIDE VACCINE AGE 96-64 HIGH RISK  Completed  . HIV Screening  Completed    Cancer Screenings: Lung: Low Dose CT Chest recommended if Age 73-80 years, 30 pack-year currently smoking OR have quit w/in 15years. Patient does not qualify. Breast:  Up to date on Mammogram? Yes   Up to date of Bone Density/Dexa? No not of age yet Colorectal:  Up to date  Additional Screenings:   Hepatitis C Screening: discuss with dr simpson     Plan:      1. Encounter for Medicare annual wellness exam I have personally reviewed and noted the following in the patient's chart:   . Medical and social history . Use of alcohol, tobacco or illicit drugs  . Current medications and supplements . Functional ability and status . Nutritional status . Physical activity . Advanced directives . List of other physicians . Hospitalizations, surgeries, and ER visits in previous 12 months . Vitals . Screenings to include cognitive, depression, and falls . Referrals and appointments  In addition, I have reviewed and discussed with patient certain preventive protocols, quality metrics, and best practice recommendations. A written personalized care plan for preventive services as well as general preventive health recommendations were provided to patient.     I provided 20 minutes of non-face-to-face time during this encounter.   Perlie Mayo, NP  03/18/2019

## 2019-03-19 ENCOUNTER — Ambulatory Visit: Payer: PPO | Admitting: Nurse Practitioner

## 2019-03-26 ENCOUNTER — Other Ambulatory Visit: Payer: Self-pay

## 2019-03-26 ENCOUNTER — Encounter: Payer: PPO | Attending: Endocrinology | Admitting: Dietician

## 2019-04-01 DIAGNOSIS — F410 Panic disorder [episodic paroxysmal anxiety] without agoraphobia: Secondary | ICD-10-CM | POA: Diagnosis not present

## 2019-04-06 ENCOUNTER — Other Ambulatory Visit: Payer: Self-pay | Admitting: Gastroenterology

## 2019-04-14 ENCOUNTER — Ambulatory Visit: Payer: PPO | Admitting: Podiatry

## 2019-04-19 ENCOUNTER — Other Ambulatory Visit: Payer: Self-pay | Admitting: Endocrinology

## 2019-04-21 DIAGNOSIS — H43821 Vitreomacular adhesion, right eye: Secondary | ICD-10-CM | POA: Diagnosis not present

## 2019-04-21 DIAGNOSIS — H43391 Other vitreous opacities, right eye: Secondary | ICD-10-CM | POA: Diagnosis not present

## 2019-04-21 DIAGNOSIS — E113593 Type 2 diabetes mellitus with proliferative diabetic retinopathy without macular edema, bilateral: Secondary | ICD-10-CM | POA: Diagnosis not present

## 2019-04-21 DIAGNOSIS — H35371 Puckering of macula, right eye: Secondary | ICD-10-CM | POA: Diagnosis not present

## 2019-04-22 DIAGNOSIS — F410 Panic disorder [episodic paroxysmal anxiety] without agoraphobia: Secondary | ICD-10-CM | POA: Diagnosis not present

## 2019-05-06 DIAGNOSIS — F410 Panic disorder [episodic paroxysmal anxiety] without agoraphobia: Secondary | ICD-10-CM | POA: Diagnosis not present

## 2019-05-12 ENCOUNTER — Telehealth: Payer: Self-pay | Admitting: Endocrinology

## 2019-05-12 ENCOUNTER — Other Ambulatory Visit: Payer: Self-pay

## 2019-05-12 MED ORDER — PEN NEEDLES 30G X 5 MM MISC: 1.0000 | Freq: Every day | 1 refills | Status: DC

## 2019-05-12 NOTE — Telephone Encounter (Signed)
MEDICATION: Insulin Pen Needle (WANTS 30 GAUGE not 32 gauge)  PHARMACY:  [facility]  IS THIS A 90 DAY SUPPLY :   IS PATIENT OUT OF MEDICATION: no   IF NOT; HOW MUCH IS LEFT:   LAST APPOINTMENT DATE: @6 /06/2019  NEXT APPOINTMENT DATE:@7 /06/2019  DO WE HAVE YOUR PERMISSION TO LEAVE A DETAILED MESSAGE: yes  OTHER COMMENTS:    **Let patient know to contact pharmacy at the end of the day to make sure medication is ready. **  ** Please notify patient to allow 48-72 hours to process**  **Encourage patient to contact the pharmacy for refills or they can request refills through [facility]**

## 2019-05-12 NOTE — Telephone Encounter (Signed)
Rx sent 

## 2019-05-18 ENCOUNTER — Other Ambulatory Visit: Payer: Self-pay

## 2019-05-18 ENCOUNTER — Ambulatory Visit: Payer: PPO | Admitting: Podiatry

## 2019-05-18 ENCOUNTER — Encounter: Payer: Self-pay | Admitting: Podiatry

## 2019-05-18 DIAGNOSIS — B351 Tinea unguium: Secondary | ICD-10-CM

## 2019-05-18 DIAGNOSIS — M79675 Pain in left toe(s): Secondary | ICD-10-CM

## 2019-05-18 DIAGNOSIS — IMO0001 Reserved for inherently not codable concepts without codable children: Secondary | ICD-10-CM

## 2019-05-18 DIAGNOSIS — E1165 Type 2 diabetes mellitus with hyperglycemia: Secondary | ICD-10-CM

## 2019-05-18 DIAGNOSIS — Z794 Long term (current) use of insulin: Secondary | ICD-10-CM

## 2019-05-18 DIAGNOSIS — M79674 Pain in right toe(s): Secondary | ICD-10-CM

## 2019-05-18 NOTE — Progress Notes (Signed)
Complaint:  Visit Type: Patient returns to my office for continued preventative foot care services. Complaint: Patient states" my nails have grown long and thick and become painful to walk and wear shoes" Patient has been diagnosed with DM with neuropathy.. The patient presents for preventative foot care services. No changes to ROS  Podiatric Exam: Vascular: dorsalis pedis and posterior tibial pulses are palpable bilateral. Capillary return is immediate. Temperature gradient is WNL. Skin turgor WNL  Sensorium: Absent  Semmes Weinstein monofilament test. Normal tactile sensation bilaterally. Nail Exam: Pt has thick disfigured discolored nails with subungual debris noted bilateral entire nail hallux through fifth toenails Ulcer Exam: There is no evidence of ulcer or pre-ulcerative changes or infection. Orthopedic Exam: Muscle tone and strength are WNL. No limitations in general ROM. No crepitus or effusions noted. Foot type and digits show no abnormalities. Bony prominences are unremarkable. Skin: No Porokeratosis. No infection or ulcers  Diagnosis:  Onychomycosis, , Pain in right toe, pain in left toes  Diabetes with neuropathy.  Treatment & Plan Procedures and Treatment: Consent by patient was obtained for treatment procedures.   Debridement of mycotic and hypertrophic toenails, 1 through 5 bilateral and clearing of subungual debris. No ulceration, no infection noted.  Return Visit-Office Procedure: Patient instructed to return to the office for a follow up visit 3 months for continued evaluation and treatment.    Janayla Marik DPM 

## 2019-05-19 ENCOUNTER — Encounter: Payer: Self-pay | Admitting: Endocrinology

## 2019-05-19 ENCOUNTER — Encounter: Payer: PPO | Admitting: Endocrinology

## 2019-05-19 NOTE — Progress Notes (Signed)
This encounter was created in error - please disregard.

## 2019-05-26 DIAGNOSIS — F410 Panic disorder [episodic paroxysmal anxiety] without agoraphobia: Secondary | ICD-10-CM | POA: Diagnosis not present

## 2019-05-27 NOTE — Progress Notes (Deleted)
Patient ID: Ashley Armstrong, female   DOB: January 03, 1966, 53 y.o.   MRN: [id number]          Reason for Appointment: Follow-up for Type 1 Diabetes  Referring physician: Dr. [name]    History of Present Illness:          Date of diagnosis of type 1 diabetes mellitus: Age 48       Background history:  She has been on insulin practically since her diagnosis was made.  Initially she had symptoms of weight loss and blurred vision For several years has been on Lantus and NovoLog but usually has had poor control with A1c mostly between 8-10%  Recent history:   Most recent A1c is 10.1   INSULIN regimen is:  Lantus 30 units twice a day, breakfast and pm.  NovoLog mostly 15-20 units before meals       Current management, blood sugar patterns and problems identified:  She was told to increase her Lantus by 6 units and reduce evening dose by 4 units but she has only changed her doses by 1 unit  Difficult to know what her blood sugar patterns are as she does not know how to upload her freestyle libre  Also she has a regular mealtimes and not clear which readings are after meals  Although her A1c was over 10% previously her average blood sugar for 30 days is reportedly around 217 according to the patient  She thinks her HIGHEST blood sugars are after her meal in the mid afternoon around 3 PM  However not clear if she is monitoring or recording her sugars after evening meal  She has been reportedly told to try and estimate her mealtime coverage based on meal size and carbohydrate intake and use a carbohydrate ratio of 1: 3 but she is still not doing this  She is mostly adjusting her NovoLog based on pre-meal blood sugar and ranging from 15-20  However overall taking more NovoLog than she used to  She still has tendency to occasionally getting low blood sugars but recently documented only once early morning and not recently getting low sugars overnight  She cannot explain why a  couple of days her sugars were close to 400  She has had more stress again causing her sugars to be higher           Meal times are:  Breakfast is 7 AM at Lunch: 12 PM dinner: 5 PM  Typical meal intake: Breakfast is sometimes high fat but otherwise eggs and toast.  Lunch usually a mixed meal and evening meal may be a sandwich with bland food.  Snacks will be cheese and crackers Usually avoiding drinks with sugar             Exercise: None    Glucose monitoring:  done 4 times a day         Glucometer:  Freestyle libre.       Blood Glucose readings by time of day and averages from record   PRE-MEAL Fasting Lunch Dinner Bedtime Overall  Glucose range:  68-293  115-370   236   Mean/median:      217   POST-MEAL PC Breakfast PC Lunch PC Dinner  Glucose range:   111-248  110  Mean/median:      Previous readings:  PRE-MEAL Fasting Lunch Dinner Bedtime Overall  Glucose range:       Mean/median:  212  232  225  191 221   POST-MEAL  PC Breakfast PC Lunch PC Dinner  Glucose range:     Mean/median:   320  178   Dietician visit, most recent: 11/18  Weight history:  Wt Readings from Last 3 Encounters:  03/18/19 135 lb (61.2 kg)  03/08/19 136 lb (61.7 kg)  11/26/18 136 lb 3.2 oz (61.8 kg)    Glycemic control:   Lab Results  Component Value Date   HGBA1C 10.1 (H) 12/17/2018   HGBA1C 8.6 (A) 11/26/2018   HGBA1C 8.9 (A) 07/31/2018   Lab Results  Component Value Date   MICROALBUR 6.1 (H) 07/31/2018   LDLCALC 109 (H) 12/17/2018   CREATININE 1.06 (H) 12/17/2018   Lab Results  Component Value Date   MICRALBCREAT 4.3 07/31/2018    No results found for: FRUCTOSAMINE  No visits with results within 1 Week(s) from this visit.  Latest known visit with results is:  Telephone on 12/17/2018  Component Date Value Ref Range Status  . Glucose, Bld 12/17/2018 177* 65 - 99 mg/dL Final   Comment: .            Fasting reference interval . For someone without known diabetes, a  glucose value >125 mg/dL indicates that they may have diabetes and this should be confirmed with a follow-up test. .   . BUN 12/17/2018 18  7 - 25 mg/dL Final  . Creat 12/17/2018 1.06* 0.50 - 1.05 mg/dL Final   Comment: For patients >22 years of age, the reference limit for Creatinine is approximately 13% higher for people identified as African-American. .   . GFR, Est Non African American 12/17/2018 61  > OR = 60 mL/min/1.26m Final  . GFR, Est African American 12/17/2018 70  > OR = 60 mL/min/1.781mFinal  . BUN/Creatinine Ratio 12/17/2018 17  6 - 22 (calc) Final  . Sodium 12/17/2018 140  135 - 146 mmol/L Final  . Potassium 12/17/2018 4.8  3.5 - 5.3 mmol/L Final  . Chloride 12/17/2018 104  98 - 110 mmol/L Final  . CO2 12/17/2018 29  20 - 32 mmol/L Final  . Calcium 12/17/2018 9.5  8.6 - 10.4 mg/dL Final  . Total Protein 12/17/2018 6.7  6.1 - 8.1 g/dL Final  . Albumin 12/17/2018 4.0  3.6 - 5.1 g/dL Final  . Globulin 12/17/2018 2.7  1.9 - 3.7 g/dL (calc) Final  . AG Ratio 12/17/2018 1.5  1.0 - 2.5 (calc) Final  . Total Bilirubin 12/17/2018 0.3  0.2 - 1.2 mg/dL Final  . Alkaline phosphatase (APISO) 12/17/2018 98  37 - 153 U/L Final  . AST 12/17/2018 17  10 - 35 U/L Final  . ALT 12/17/2018 14  6 - 29 U/L Final  . Vit D, 25-Hydroxy 12/17/2018 36  30 - 100 ng/mL Final   Comment: Vitamin D Status         25-OH Vitamin D: . Deficiency:                    <20 ng/mL Insufficiency:             20 - 29 ng/mL Optimal:                 > or = 30 ng/mL . For 25-OH Vitamin D testing on patients on  D2-supplementation and patients for whom quantitation  of D2 and D3 fractions is required, the QuestAssureD(TM) 25-OH VIT D, (D2,D3), LC/MS/MS is recommended: order  code [id number] >2y17yr . For more information on this test, go to: http://education.quest[facility].com/faq/FAQ163 (  This link is being provided for  informational/educational purposes only.)   . Cholesterol 12/17/2018 196   <200 mg/dL Final  . HDL 12/17/2018 67  > OR = 50 mg/dL Final  . Triglycerides 12/17/2018 105  <150 mg/dL Final  . LDL Cholesterol (Calc) 12/17/2018 109* mg/dL (calc) Final   Comment: Reference range: <100 . Desirable range <100 mg/dL for primary prevention;   <70 mg/dL for patients with CHD or diabetic patients  with > or = 2 CHD risk factors. Marland Kitchen LDL-C is now calculated using the Martin-Hopkins  calculation, which is a validated novel method providing  better accuracy than the Friedewald equation in the  estimation of LDL-C.  Cresenciano Genre et al. Annamaria Helling. 1740;814(48): 2061-2068  (http://education.[facility].com/faq/FAQ164)   . Total CHOL/HDL Ratio 12/17/2018 2.9  <5.0 (calc) Final  . Non-HDL Cholesterol (Calc) 12/17/2018 129  <130 mg/dL (calc) Final   Comment: For patients with diabetes plus 1 major ASCVD risk  factor, treating to a non-HDL-C goal of <100 mg/dL  (LDL-C of <70 mg/dL) is considered a therapeutic  option.   . Hgb A1c MFr Bld 12/17/2018 10.1* <5.7 % of total Hgb Final   Comment: For someone without known diabetes, a hemoglobin A1c value of 6.5% or greater indicates that they may have  diabetes and this should be confirmed with a follow-up  test. . For someone with known diabetes, a value <7% indicates  that their diabetes is well controlled and a value  greater than or equal to 7% indicates suboptimal  control. A1c targets should be individualized based on  duration of diabetes, age, comorbid conditions, and  other considerations. . Currently, no consensus exists regarding use of hemoglobin A1c for diagnosis of diabetes for children. .     Allergies as of 05/28/2019   No Known Allergies     Medication List       Accurate as of May 27, 2019  9:24 PM. If you have any questions, ask your nurse or doctor.        busPIRone 10 MG tablet Commonly known as: BUSPAR Take 5 mg by mouth 2 (two) times daily.   calcium-vitamin D 500-200 MG-UNIT Tabs tablet  Commonly known as: OSCAL WITH D Take 1 tablet by mouth daily.   Durezol 0.05 % Emul Generic drug: Difluprednate Apply 1 drop to eye daily at 8 pm.   Fish Oil 1000 MG Caps Take 1 capsule by mouth daily.   FreeStyle Libre 14 Day Reader Kerrin Mo 1 each by Other route See admin instructions. Change sensor every 2 weeks   FreeStyle Libre 14 Day Sensor Misc USE AS DIRECTED EVERY 14 DAYS.   glucose blood test strip Commonly known as: ONE TOUCH ULTRA TEST TEST FOUR TIMES A DAY AS DIRECTED.   Insulin Glargine 100 UNIT/ML Solostar Pen Commonly known as: Lantus SoloStar Inject 31 units in the morning and 21 at  6 in the evening   lisinopril 2.5 MG tablet Commonly known as: ZESTRIL TAKE ONE (1) TABLET BY MOUTH EVERY DAY   metoCLOPramide 5 MG tablet Commonly known as: REGLAN TAKE ONE TO TWO TABLETS BY MOUTH EVERY SIX HOURS AS NEEDED FOR NAUSEAOR VOMITING   mirtazapine 30 MG tablet Commonly known as: REMERON Take 1 tablet by mouth daily at 8 pm.   montelukast 10 MG tablet Commonly known as: SINGULAIR Take 1 tablet (10 mg total) by mouth at bedtime.   NovoLOG FlexPen 100 UNIT/ML FlexPen Generic drug: insulin aspart INJECT 8 TO 12 UNITS SUBCUTANEOUSLY BEFORE EACH  MEAL AS DIRECTED. ADJUST PERSLIDING SCALE DEPENDING ON SUGAR READING. MAX DOSE PER DAY IS 51   omeprazole 40 MG capsule Commonly known as: PRILOSEC TAKE ONE CAPSULE BY MOUTH 30 MINUTES PRIOR TO BREAKFAST AND SUPPER   ondansetron 4 MG tablet Commonly known as: ZOFRAN TAKE ONE TABLET BY MOUTH EVERY EIGHT HOURS AS NEEDED FOR NAUSEA OR VOMITING   ONE TOUCH ULTRA MINI w/Device Kit Use as directed   OneTouch Delica Lancets 83J Misc USE FOUR TIMES A DAY AS DIRECTED.   Pen Needles 30G X 5 MM Misc Inject 1 each into the skin 5 (five) times daily. Use pen needles to inject insulin 5 times daily.   polyethylene glycol 17 g packet Commonly known as: MIRALAX / GLYCOLAX Take 17 g by mouth daily as needed.   pravastatin 80 MG  tablet Commonly known as: PRAVACHOL Take 1 tablet (80 mg total) by mouth every evening.   Valium 10 MG tablet Generic drug: diazepam Take one (1) Tablet Once a Day, As Necessary for panic   vitamin B-12 100 MCG tablet Commonly known as: CYANOCOBALAMIN Take 1 tablet by mouth daily.       Allergies: No Known Allergies  Past Medical History:  Diagnosis Date  . Anxiety   . Dyslipidemia   . Gastroparesis due to DM ([place]) OCT 2014   75% AT 2 HRS, GLU >   . H/O eye surgery 01/2018  . Headache(784.0)   . IDDM (insulin dependent diabetes mellitus) ([place])   . Neuropathy   . Nicotine addiction   . TIA (transient ischemic attack)    Dr. Merlene Laughter    Past Surgical History:  Procedure Laterality Date  . Cataract surgery Bilateral 01/20/2017, 02/03/2017  . COLONOSCOPY N/A 05/09/2016   Procedure: COLONOSCOPY;  Surgeon: Danie Binder, MD;  Location: [facility];  Service: Endoscopy;  Laterality: N/A;  130  . ESOPHAGOGASTRODUODENOSCOPY N/A 08/31/2013   SLF: 1. Earky satiety nausea may be due to Gastroparesis/pyloric channel stenosis. 2. small hiatal hernia 3. Moderate erosive gastritis.   Marland Kitchen EYE SURGERY  01/2018  . laser surgery bilateral eye      Family History  Problem Relation Age of Onset  . Hypertension Mother   . Diabetes Mother   . Hyperlipidemia Mother   . Rosacea Mother   . Hypertension Father   . Diabetes Brother   . Breast cancer Maternal Aunt   . Colon cancer Neg Hx     Social History:  reports that she quit smoking about 7 years ago. Her smoking use included cigarettes. She has a 36.00 pack-year smoking history. She has never used smokeless tobacco. She reports that she does not drink alcohol or use drugs.   Review of Systems   Lipid history: Followed by PCP, currently on pravastatin 80 mg Labs as follows    Lab Results  Component Value Date   CHOL 196 12/17/2018   HDL 67 12/17/2018   LDLCALC 109 (H) 12/17/2018   TRIG 105 12/17/2018   CHOLHDL 2.9  12/17/2018           Hypertension: Recently well controlled  BP Readings from Last 3 Encounters:  03/18/19 114/66  03/08/19 114/66  11/26/18 102/62    Most recent eye exam was in 5/19  Most recent foot exam: 9/19  GASTROPARESIS: Overall her nausea is better but recently at times will still have some nausea and vomiting Followed by gastroenterologist now She is trying to take her metoclopramide before eating most of the time  Currently known complications of diabetes: Gastroparesis, neuropathy, retinopathy, no nephropathy    LABS:  No visits with results within 1 Week(s) from this visit.  Latest known visit with results is:  Telephone on 12/17/2018  Component Date Value Ref Range Status  . Glucose, Bld 12/17/2018 177* 65 - 99 mg/dL Final   Comment: .            Fasting reference interval . For someone without known diabetes, a glucose value >125 mg/dL indicates that they may have diabetes and this should be confirmed with a follow-up test. .   . BUN 12/17/2018 18  7 - 25 mg/dL Final  . Creat 12/17/2018 1.06* 0.50 - 1.05 mg/dL Final   Comment: For patients >25 years of age, the reference limit for Creatinine is approximately 13% higher for people identified as African-American. .   . GFR, Est Non African American 12/17/2018 61  > OR = 60 mL/min/1.28m Final  . GFR, Est African American 12/17/2018 70  > OR = 60 mL/min/1.766mFinal  . BUN/Creatinine Ratio 12/17/2018 17  6 - 22 (calc) Final  . Sodium 12/17/2018 140  135 - 146 mmol/L Final  . Potassium 12/17/2018 4.8  3.5 - 5.3 mmol/L Final  . Chloride 12/17/2018 104  98 - 110 mmol/L Final  . CO2 12/17/2018 29  20 - 32 mmol/L Final  . Calcium 12/17/2018 9.5  8.6 - 10.4 mg/dL Final  . Total Protein 12/17/2018 6.7  6.1 - 8.1 g/dL Final  . Albumin 12/17/2018 4.0  3.6 - 5.1 g/dL Final  . Globulin 12/17/2018 2.7  1.9 - 3.7 g/dL (calc) Final  . AG Ratio 12/17/2018 1.5  1.0 - 2.5 (calc) Final  . Total Bilirubin 12/17/2018  0.3  0.2 - 1.2 mg/dL Final  . Alkaline phosphatase (APISO) 12/17/2018 98  37 - 153 U/L Final  . AST 12/17/2018 17  10 - 35 U/L Final  . ALT 12/17/2018 14  6 - 29 U/L Final  . Vit D, 25-Hydroxy 12/17/2018 36  30 - 100 ng/mL Final   Comment: Vitamin D Status         25-OH Vitamin D: . Deficiency:                    <20 ng/mL Insufficiency:             20 - 29 ng/mL Optimal:                 > or = 30 ng/mL . For 25-OH Vitamin D testing on patients on  D2-supplementation and patients for whom quantitation  of D2 and D3 fractions is required, the QuestAssureD(TM) 25-OH VIT D, (D2,D3), LC/MS/MS is recommended: order  code [id number] >2y23yr . For more information on this test, go to: http://education.[facility].com/faq/FAQ163 (This link is being provided for  informational/educational purposes only.)   . Cholesterol 12/17/2018 196  <200 mg/dL Final  . HDL 12/17/2018 67  > OR = 50 mg/dL Final  . Triglycerides 12/17/2018 105  <150 mg/dL Final  . LDL Cholesterol (Calc) 12/17/2018 109* mg/dL (calc) Final   Comment: Reference range: <100 . Desirable range <100 mg/dL for primary prevention;   <70 mg/dL for patients with CHD or diabetic patients  with > or = 2 CHD risk factors. . LMarland KitchenL-C is now calculated using the Martin-Hopkins  calculation, which is a validated novel method providing  better accuracy than the Friedewald equation in the  estimation of LDL-C.  MarCresenciano Genre al.  JAMA. 6468;032(12): [phone]  (http://education.[facility].com/faq/FAQ164)   . Total CHOL/HDL Ratio 12/17/2018 2.9  <5.0 (calc) Final  . Non-HDL Cholesterol (Calc) 12/17/2018 129  <130 mg/dL (calc) Final   Comment: For patients with diabetes plus 1 major ASCVD risk  factor, treating to a non-HDL-C goal of <100 mg/dL  (LDL-C of <70 mg/dL) is considered a therapeutic  option.   . Hgb A1c MFr Bld 12/17/2018 10.1* <5.7 % of total Hgb Final   Comment: For someone without known diabetes, a  hemoglobin A1c value of 6.5% or greater indicates that they may have  diabetes and this should be confirmed with a follow-up  test. . For someone with known diabetes, a value <7% indicates  that their diabetes is well controlled and a value  greater than or equal to 7% indicates suboptimal  control. A1c targets should be individualized based on  duration of diabetes, age, comorbid conditions, and  other considerations. . Currently, no consensus exists regarding use of hemoglobin A1c for diagnosis of diabetes for children. Marland Kitchen     Physical Examination:  LMP 07/26/2013        ASSESSMENT:  Diabetes type 1, poorly controlled with multiple complications  Last B0W was 10.1  See history of present illness for detailed discussion of current diabetes management, blood sugar patterns and problems identified   Her blood sugars are difficult to assess since her freestyle Elenor Legato was not downloaded No consistent pattern seen However day-to-day management of her diabetes was discussed especially mealtime insulin coverage Since she is reporting higher readings after her main meal in the mid afternoon she likely will need to take about 25 units at that meal Again discussed lubricant carbohydrate content and trying to use 1: 3 ratio  Also discussed use of the insulin pump and potential advantages of this to prevent hypoglycemia and hypoglycemia She is still reluctant to change her ways and how she is doing her insulin  Currently taking a symmetrical dose of Lantus and this may be reasonably okay for her to continue  GASTROPARESIS: Has had overall her symptoms and reminded her to take her metoclopramide 30 minutes before eating every meal  PLAN:    No change in Lantus as yet but may need reduced evening dose if getting overnight hypoglycemia  She needs to check readings after meals consistently especially after evening meal  As above.  Needs to increase her coverage for her midafternoon  meals and target her postprandial readings under 180  She will try to start carbohydrate counting and also see the dietitian on her next visit  Call if having any hypoglycemia  Will discuss insulin pump again on the next visit  She will also need to be instructed on how to upload her freestyle libre for online access  She will follow-up in 2 months for A1c   Counseling time on subjects discussed in assessment and plan sections is over 50% of today's 25 minute visit  Elayne Snare 05/27/2019, 9:24 PM   Note: This office note was prepared with Dragon voice recognition system technology. Any transcriptional errors that result from this process are unintentional.

## 2019-05-28 ENCOUNTER — Ambulatory Visit: Payer: PPO | Admitting: Endocrinology

## 2019-06-03 DIAGNOSIS — F410 Panic disorder [episodic paroxysmal anxiety] without agoraphobia: Secondary | ICD-10-CM | POA: Diagnosis not present

## 2019-06-07 ENCOUNTER — Other Ambulatory Visit: Payer: Self-pay

## 2019-06-07 ENCOUNTER — Ambulatory Visit (INDEPENDENT_AMBULATORY_CARE_PROVIDER_SITE_OTHER): Payer: PPO | Admitting: Endocrinology

## 2019-06-07 ENCOUNTER — Encounter: Payer: Self-pay | Admitting: Endocrinology

## 2019-06-07 ENCOUNTER — Other Ambulatory Visit: Payer: Self-pay | Admitting: Endocrinology

## 2019-06-07 VITALS — BP 110/70 | HR 78 | Ht 68.0 in | Wt 133.4 lb

## 2019-06-07 DIAGNOSIS — E1043 Type 1 diabetes mellitus with diabetic autonomic (poly)neuropathy: Secondary | ICD-10-CM

## 2019-06-07 DIAGNOSIS — E1065 Type 1 diabetes mellitus with hyperglycemia: Secondary | ICD-10-CM | POA: Diagnosis not present

## 2019-06-07 DIAGNOSIS — K3184 Gastroparesis: Secondary | ICD-10-CM | POA: Diagnosis not present

## 2019-06-07 LAB — POCT GLYCOSYLATED HEMOGLOBIN (HGB A1C): Hemoglobin A1C: 9.8 % — AB (ref 4.0–5.6)

## 2019-06-07 NOTE — Patient Instructions (Addendum)
Lantus 30 in am and 24 at dinner, need higher dose of Lantus insulin in the morning  Adjusting LANTUS:  Every 3 to 4 days adjust the doses based on the following guidelines  MORNING dose: This will be adjusted based on blood sugar before your supper meal.  If after 3 days the sugar is still over 150-160 around suppertime increase the dose by 2 units in the morning and continue to increase it every 3 days  If the sugar before suppertime starts getting below 90 reduce the dose by 2 units  EVENING dose of Lantus:  If after 3 days the sugar is still over 150-160 on waking up increase the dose by 2 units in the evening and continue to increase it every 3 days  If the sugar before breakfast starts getting below 90 reduce the dose by 2 units  Additional NovoLog insulin for high sugars, on top of what you are taking for the food coverage:  For blood sugars between 150-200 increase the dose by 2 units Blood sugars between 200-250: Increase the dose by 4 units Over 250: Increase the dose by 6 units  NOVOLOG: Take at least 5 to 6 units in the morning when you are drinking coffee even if you are not eating breakfast  TRESIBA insulin: This will be prescribed when you are about to finish to Lantus, please call at that time

## 2019-06-07 NOTE — Progress Notes (Addendum)
Patient ID: Ashley Armstrong, female   DOB: 1966/03/30, 53 y.o.   MRN: [id number]           Reason for Appointment: Follow-up for Type 1 Diabetes  Referring physician: Dr. [name]   Today's office visit was provided via telemedicine using video technique Explained to the patient and the the limitations of evaluation and management by telemedicine and the availability of in person appointments.  The patient understood the limitations and agreed to proceed. Patient also understood that the telehealth visit is billable. . Location of the patient: Home . Location of the provider: Office Only the patient and myself were participating in the encounter    History of Present Illness:          Date of diagnosis of type 1 diabetes mellitus: Age 39       Background history:  She has been on insulin practically since her diagnosis was made.  Initially she had symptoms of weight loss and blurred vision For several years has been on Lantus and NovoLog but usually has had poor control with A1c mostly between 8-10%  Recent history:     INSULIN regimen is:  Lantus 21 units breakfast and -- in pm.  NovoLog mostly 15-20 units before meals       Current management, blood sugar patterns and problems identified:  Her A1c is still significantly higher at 9.8 compared to 10.1 previously  She was told to continue 30 units twice daily of her Lantus insulin but for some reason she is taking 9 units less in the morning and about the same in the evening  With this her blood sugars are very consistently high throughout the day  Even with eating minimal food are carbohydrate in the morning frequently her blood sugars are going up  She is also usually overreacting to low normal readings and make the blood sugar go up  She has been advised to try the insulin pump for better control but she is very reluctant to do that again  Her appetite at mealtimes are variable  As before her mealtime dose is  being selected arbitrarily and she does not like to do carbohydrate counting; today for eating a biscuit and eggs she took 10 units of insulin with adequate coverage, previously was recommended using a 1: 3 ratio           Meal times are:  Breakfast is 7 AM at Lunch: 12 PM dinner: 5 PM  Typical meal intake: Breakfast is sometimes high fat but otherwise eggs and toast.  Lunch usually a mixed meal and evening meal may be a sandwich with bland food.  Snacks will be cheese and crackers Usually avoiding drinks with sugar             Exercise: None    Glucose monitoring:  done 4 times a day         Glucometer:  Freestyle libre.       Blood Glucose readings by time of day and averages from record   CONTINUOUS GLUCOSE MONITORING RECORD INTERPRETATION    Dates of Recording: 7/14 through 7/27  Sensor description: [employer]  Results statistics:   CGM use % of time  69  Average and SD  236, GV 27  Time in range       17 %  % Time Above 180  43  % Time above 250  39  % Time Below target  1    Glycemic patterns summary:  Blood sugar data is incomplete as she did not use her sensor for 3 days Although she does have moderate variability her blood sugars are fairly consistently above her target range except early morning but even then they are not usually near normal HIGHEST blood sugars are generally between 2 PM-midnight  Hyperglycemic episodes are present throughout the day with significant spikes at times in the morning after 6 AM and periodically the rest of the day and into the night  Hypoglycemic episodes have not been occurring except low normal readings around 4 AM  Overnight periods: Blood sugars are higher to start with but generally come down to different extent in the mornings with only one low normal reading  Preprandial periods: Blood sugars are averaging about 170 before breakfast She really is not eating a lunch Blood sugars are still over 200 before her main meal in  the late afternoon  Postprandial periods:   After breakfast: Blood sugars are usually very consistently going up regardless of her intake although today with eating a balanced meal her blood sugar was only 140 in the office  After dinner: After her main meal around 3-4 PM blood sugars are moderately higher but occasionally can go up as much as 150 mg   PRE-MEAL Fasting Lunch Dinner Bedtime Overall  Glucose range:       Mean/median:  173  235 252  259  236   POST-MEAL PC Breakfast PC Lunch PC Dinner  Glucose range:     Mean/median:  257    284    Previous readings:   PRE-MEAL Fasting Lunch Dinner Bedtime Overall  Glucose range:  68-293  115-370   236   Mean/median:      217   POST-MEAL PC Breakfast PC Lunch PC Dinner  Glucose range:   111-248  110  Mean/median:       Dietician visit, most recent: 11/18  Weight history:  Wt Readings from Last 3 Encounters:  06/07/19 133 lb 6.4 oz (60.5 kg)  03/18/19 135 lb (61.2 kg)  03/08/19 136 lb (61.7 kg)    Glycemic control:   Lab Results  Component Value Date   HGBA1C 9.8 (A) 06/07/2019   HGBA1C 10.1 (H) 12/17/2018   HGBA1C 8.6 (A) 11/26/2018   Lab Results  Component Value Date   MICROALBUR 6.1 (H) 07/31/2018   LDLCALC 109 (H) 12/17/2018   CREATININE 1.06 (H) 12/17/2018   Lab Results  Component Value Date   MICRALBCREAT 4.3 07/31/2018    No results found for: FRUCTOSAMINE  Office Visit on 06/07/2019  Component Date Value Ref Range Status  . Hemoglobin A1C 06/07/2019 9.8* 4.0 - 5.6 % Final    Allergies as of 06/07/2019   No Known Allergies     Medication List       Accurate as of June 07, 2019 11:18 AM. If you have any questions, ask your nurse or doctor.        STOP taking these medications   ONE TOUCH ULTRA MINI w/Device Kit Stopped by: Elayne Snare, MD     TAKE these medications   busPIRone 10 MG tablet Commonly known as: BUSPAR Take 5 mg by mouth 2 (two) times daily.   calcium-vitamin D  500-200 MG-UNIT Tabs tablet Commonly known as: OSCAL WITH D Take 1 tablet by mouth daily.   Durezol 0.05 % Emul Generic drug: Difluprednate Apply 1 drop to eye daily at 8 pm.   Fish Oil 1000 MG Caps Take 1 capsule by mouth  daily.   FreeStyle Libre 14 Day Reader Kerrin Mo 1 each by Other route See admin instructions. Change sensor every 2 weeks   FreeStyle Libre 14 Day Sensor Misc USE AS DIRECTED EVERY 14 DAYS.   glucose blood test strip Commonly known as: ONE TOUCH ULTRA TEST TEST FOUR TIMES A DAY AS DIRECTED.   Insulin Glargine 100 UNIT/ML Solostar Pen Commonly known as: Lantus SoloStar Inject 31 units in the morning and 21 at  6 in the evening   lisinopril 2.5 MG tablet Commonly known as: ZESTRIL TAKE ONE (1) TABLET BY MOUTH EVERY DAY   metoCLOPramide 5 MG tablet Commonly known as: REGLAN TAKE ONE TO TWO TABLETS BY MOUTH EVERY SIX HOURS AS NEEDED FOR NAUSEAOR VOMITING   mirtazapine 30 MG tablet Commonly known as: REMERON Take 1 tablet by mouth daily at 8 pm.   montelukast 10 MG tablet Commonly known as: SINGULAIR Take 1 tablet (10 mg total) by mouth at bedtime.   NovoLOG FlexPen 100 UNIT/ML FlexPen Generic drug: insulin aspart INJECT 8 TO 12 UNITS SUBCUTANEOUSLY BEFORE EACH MEAL AS DIRECTED. ADJUST PERSLIDING SCALE DEPENDING ON SUGAR READING. MAX DOSE PER DAY IS 51   omeprazole 40 MG capsule Commonly known as: PRILOSEC TAKE ONE CAPSULE BY MOUTH 30 MINUTES PRIOR TO BREAKFAST AND SUPPER   ondansetron 4 MG tablet Commonly known as: ZOFRAN TAKE ONE TABLET BY MOUTH EVERY EIGHT HOURS AS NEEDED FOR NAUSEA OR VOMITING   OneTouch Delica Lancets 64B Misc USE FOUR TIMES A DAY AS DIRECTED.   Pen Needles 30G X 5 MM Misc Inject 1 each into the skin 5 (five) times daily. Use pen needles to inject insulin 5 times daily.   polyethylene glycol 17 g packet Commonly known as: MIRALAX / GLYCOLAX Take 17 g by mouth daily as needed.   pravastatin 80 MG tablet Commonly known as:  PRAVACHOL Take 1 tablet (80 mg total) by mouth every evening.   Valium 10 MG tablet Generic drug: diazepam Take one (1) Tablet Once a Day, As Necessary for panic   vitamin B-12 100 MCG tablet Commonly known as: CYANOCOBALAMIN Take 1 tablet by mouth daily.       Allergies: No Known Allergies  Past Medical History:  Diagnosis Date  . Anxiety   . Dyslipidemia   . Gastroparesis due to DM ([place]) OCT 2014   75% AT 2 HRS, GLU >   . H/O eye surgery 01/2018  . Headache(784.0)   . IDDM (insulin dependent diabetes mellitus) ([place])   . Neuropathy   . Nicotine addiction   . TIA (transient ischemic attack)    Dr. Merlene Laughter    Past Surgical History:  Procedure Laterality Date  . Cataract surgery Bilateral 01/20/2017, 02/03/2017  . COLONOSCOPY N/A 05/09/2016   Procedure: COLONOSCOPY;  Surgeon: Danie Binder, MD;  Location: [facility];  Service: Endoscopy;  Laterality: N/A;  130  . ESOPHAGOGASTRODUODENOSCOPY N/A 08/31/2013   SLF: 1. Earky satiety nausea may be due to Gastroparesis/pyloric channel stenosis. 2. small hiatal hernia 3. Moderate erosive gastritis.   [facility] EYE SURGERY  01/2018  . laser surgery bilateral eye      Family History  Problem Relation Age of Onset  . Hypertension Mother   . Diabetes Mother   . Hyperlipidemia Mother   . Rosacea Mother   . Hypertension Father   . Diabetes Brother   . Breast cancer Maternal Aunt   . Colon cancer Neg Hx     Social History:  reports that she  quit smoking about 7 years ago. Her smoking use included cigarettes. She has a 36.00 pack-year smoking history. She has never used smokeless tobacco. She reports that she does not drink alcohol or use drugs.   Review of Systems   Lipid history: Followed by PCP, currently on pravastatin 80 mg Labs as follows    Lab Results  Component Value Date   CHOL 196 12/17/2018   HDL 67 12/17/2018   LDLCALC 109 (H) 12/17/2018   TRIG 105 12/17/2018   CHOLHDL 2.9 12/17/2018            Hypertension: This is well controlled  BP Readings from Last 3 Encounters:  06/07/19 110/70  03/18/19 114/66  03/08/19 114/66    Most recent eye exam was in 5/19  Most recent foot exam: 9/19  GASTROPARESIS: Recently her nausea is well controlled but may have some bloating at times including in the mornings Followed by gastroenterologist now She is trying to take her metoclopramide before eating most of the time  Currently known complications of diabetes: Gastroparesis, neuropathy, retinopathy, no nephropathy  She is having more problems with anxiety and panic attacks and is currently not satisfied with her treatment from a psychiatrist  LABS:  Office Visit on 06/07/2019  Component Date Value Ref Range Status  . Hemoglobin A1C 06/07/2019 9.8* 4.0 - 5.6 % Final    Physical Examination:  BP 110/70 (BP Location: Left Arm, Patient Position: Sitting, Cuff Size: Normal)   Pulse 78   Ht '5\' 8"'  (1.727 m)   Wt 133 lb 6.4 oz (60.5 kg)   LMP 07/26/2013   SpO2 97%   BMI 20.28 kg/m        ASSESSMENT:  Diabetes type 1, poorly controlled with multiple complications  Her B1Y has gone up this year and is now still about the same at 9.8  She is not able to adjust her insulin doses appropriately on her own She is taking much less basal insulin on her morning dose and blood sugars are averaging over 200 on her freestyle libre throughout the day and only come down some during the night before she wakes up Also difficult to assess her postprandial readings since she does not have any set mealtimes but mostly having hyperglycemia in the mornings regardless of how much she is eating at breakfast Blood sugar may also at times go up after her main meal in the late afternoon    GASTROPARESIS: Fairly well controlled except for bloating symptoms at times   PLAN:    Detailed instructions were given for insulin adjustment and recommended doses, see patient instructions  She does need much  more basal insulin during the day and relatively less overnight but not clear what her insulin doses need to be overall  She will be given instructions on titration of her Lantus both morning and evening based on pre-meal blood sugars in the evening and morning  For now she can start with 30 in the morning and 24 in the evening instead of less Lantus in the morning  Also on the next prescription she will switch to the same doses of Tresiba U-200  Also given her adjustment scale for treating hyperglycemia before meals  She will need to take at least 5 to 6 units of NovoLog in the morning just for having her coffee or to counteract the dawn phenomena  Discussed that she can use a saline pump as a trial on the next visit, she still is reluctant to consider this  She was given logbook to keep a record of her sugars and her insulin doses to help adjustment and to know how much she needs to take based on her glucose history  To use freestyle libre consistently   Counseling time on subjects discussed in assessment and plan sections is over 50% of today's 25 minute visit  Patient Instructions  Lantus 30 in am and 24 at dinner, need higher dose of Lantus insulin in the morning  Adjusting LANTUS:  Every 3 to 4 days adjust the doses based on the following guidelines  MORNING dose: This will be adjusted based on blood sugar before your supper meal.  If after 3 days the sugar is still over 150-160 around suppertime increase the dose by 2 units in the morning and continue to increase it every 3 days  If the sugar before suppertime starts getting below 90 reduce the dose by 2 units  EVENING dose of Lantus:  If after 3 days the sugar is still over 150-160 on waking up increase the dose by 2 units in the evening and continue to increase it every 3 days  If the sugar before breakfast starts getting below 90 reduce the dose by 2 units  Additional NovoLog insulin for high sugars, on top of what  you are taking for the food coverage:  For blood sugars between 150-200 increase the dose by 2 units Blood sugars between 200-250: Increase the dose by 4 units Over 250: Increase the dose by 6 units  NOVOLOG: Take at least 5 to 6 units in the morning when you are drinking coffee even if you are not eating breakfast  TRESIBA insulin: This will be prescribed when you are about to finish to Lantus, please call at that time    Elayne Snare 06/07/2019, 11:18 AM   Note: This office note was prepared with Dragon voice recognition system technology. Any transcriptional errors that result from this process are unintentional.

## 2019-06-07 NOTE — Progress Notes (Signed)
poc

## 2019-06-30 DIAGNOSIS — F410 Panic disorder [episodic paroxysmal anxiety] without agoraphobia: Secondary | ICD-10-CM | POA: Diagnosis not present

## 2019-07-01 DIAGNOSIS — F410 Panic disorder [episodic paroxysmal anxiety] without agoraphobia: Secondary | ICD-10-CM | POA: Diagnosis not present

## 2019-07-08 ENCOUNTER — Other Ambulatory Visit: Payer: Self-pay | Admitting: Endocrinology

## 2019-07-21 ENCOUNTER — Other Ambulatory Visit (HOSPITAL_COMMUNITY): Payer: Self-pay | Admitting: Family Medicine

## 2019-07-21 DIAGNOSIS — Z1231 Encounter for screening mammogram for malignant neoplasm of breast: Secondary | ICD-10-CM

## 2019-07-26 ENCOUNTER — Ambulatory Visit (INDEPENDENT_AMBULATORY_CARE_PROVIDER_SITE_OTHER): Payer: PPO

## 2019-07-26 ENCOUNTER — Ambulatory Visit (HOSPITAL_COMMUNITY): Admission: RE | Admit: 2019-07-26 | Payer: PPO | Source: Ambulatory Visit

## 2019-07-26 ENCOUNTER — Other Ambulatory Visit: Payer: Self-pay

## 2019-07-26 DIAGNOSIS — Z23 Encounter for immunization: Secondary | ICD-10-CM | POA: Diagnosis not present

## 2019-07-26 DIAGNOSIS — E785 Hyperlipidemia, unspecified: Secondary | ICD-10-CM | POA: Diagnosis not present

## 2019-07-26 DIAGNOSIS — Z794 Long term (current) use of insulin: Secondary | ICD-10-CM | POA: Diagnosis not present

## 2019-07-26 DIAGNOSIS — E1165 Type 2 diabetes mellitus with hyperglycemia: Secondary | ICD-10-CM | POA: Diagnosis not present

## 2019-07-27 ENCOUNTER — Encounter: Payer: Self-pay | Admitting: Family Medicine

## 2019-07-27 LAB — COMPLETE METABOLIC PANEL WITH GFR
AG Ratio: 1.8 (calc) (ref 1.0–2.5)
ALT: 12 U/L (ref 6–29)
AST: 14 U/L (ref 10–35)
Albumin: 3.9 g/dL (ref 3.6–5.1)
Alkaline phosphatase (APISO): 82 U/L (ref 37–153)
BUN/Creatinine Ratio: 16 (calc) (ref 6–22)
BUN: 18 mg/dL (ref 7–25)
CO2: 26 mmol/L (ref 20–32)
Calcium: 9.5 mg/dL (ref 8.6–10.4)
Chloride: 107 mmol/L (ref 98–110)
Creat: 1.12 mg/dL — ABNORMAL HIGH (ref 0.50–1.05)
GFR, Est African American: 66 mL/min/{1.73_m2} (ref >=60)
GFR, Est Non African American: 57 mL/min/{1.73_m2} — ABNORMAL LOW (ref >=60)
Globulin: 2.2 g/dL (calc) (ref 1.9–3.7)
Glucose, Bld: 258 mg/dL — ABNORMAL HIGH (ref 65–99)
Potassium: 5.1 mmol/L (ref 3.5–5.3)
Sodium: 139 mmol/L (ref 135–146)
Total Bilirubin: 0.4 mg/dL (ref 0.2–1.2)
Total Protein: 6.1 g/dL (ref 6.1–8.1)

## 2019-07-27 LAB — MICROALBUMIN / CREATININE URINE RATIO
Creatinine, Urine: 140 mg/dL (ref 20–275)
Microalb Creat Ratio: 12 mcg/mg creat (ref <30)
Microalb, Ur: 1.7 mg/dL

## 2019-07-27 LAB — LIPID PANEL
Cholesterol: 154 mg/dL (ref <200)
HDL: 59 mg/dL (ref >=50)
LDL Cholesterol (Calc): 77 mg/dL (calc)
Non-HDL Cholesterol (Calc): 95 mg/dL (calc) (ref <130)
Total CHOL/HDL Ratio: 2.6 (calc) (ref <5.0)
Triglycerides: 94 mg/dL (ref <150)

## 2019-07-27 LAB — TSH: TSH: 1.53 mIU/L

## 2019-07-28 ENCOUNTER — Other Ambulatory Visit: Payer: Self-pay | Admitting: Gastroenterology

## 2019-08-09 ENCOUNTER — Telehealth: Payer: Self-pay | Admitting: Endocrinology

## 2019-08-09 ENCOUNTER — Other Ambulatory Visit: Payer: Self-pay

## 2019-08-09 ENCOUNTER — Ambulatory Visit: Payer: PPO | Admitting: Endocrinology

## 2019-08-09 ENCOUNTER — Other Ambulatory Visit: Payer: Self-pay | Admitting: Endocrinology

## 2019-08-09 MED ORDER — LANTUS SOLOSTAR 100 UNIT/ML ~~LOC~~ SOPN: PEN_INJECTOR | SUBCUTANEOUS | 0 refills | Status: DC

## 2019-08-09 NOTE — Telephone Encounter (Signed)
Cathy or Pharmacist at [facility] requests to be called at ph# [phone] re: a RX for [employer] they received. Please call the above to advise.

## 2019-08-09 NOTE — Telephone Encounter (Signed)
Rx resent for 90 day supply per Cathy's request at [facility].

## 2019-08-11 ENCOUNTER — Other Ambulatory Visit: Payer: Self-pay

## 2019-08-11 ENCOUNTER — Telehealth: Payer: Self-pay | Admitting: Endocrinology

## 2019-08-11 DIAGNOSIS — E1065 Type 1 diabetes mellitus with hyperglycemia: Secondary | ICD-10-CM

## 2019-08-11 MED ORDER — PEN NEEDLES 30G X 5 MM MISC: 1.0000 | Freq: Every day | 1 refills | Status: DC

## 2019-08-11 NOTE — Telephone Encounter (Signed)
Insulin Pen Needle (PEN NEEDLES) 30G X 5 MM MISC 450 each 1 08/11/2019    Sig - Route: Inject 1 each into the skin 5 (five) times daily. Use pen needles to inject insulin 5 times daily. - Subcutaneous   Sent to pharmacy as: Insulin Pen Needle (PEN NEEDLES) 30G X 5 MM Misc   E-Prescribing Status: Receipt confirmed by pharmacy (08/11/2019 3:19 PM EDT)

## 2019-08-11 NOTE — Telephone Encounter (Signed)
[facility] called - patient is requesting a change to a 30G needle for her insulin. Pharmacy would like to know if they can substitute that for the existing needle size?  Please advise pharmacy at [phone]

## 2019-08-18 ENCOUNTER — Encounter: Payer: PPO | Admitting: Family Medicine

## 2019-08-19 ENCOUNTER — Other Ambulatory Visit: Payer: Self-pay | Admitting: Endocrinology

## 2019-08-25 ENCOUNTER — Ambulatory Visit: Payer: PPO | Admitting: Podiatry

## 2019-08-25 DIAGNOSIS — F410 Panic disorder [episodic paroxysmal anxiety] without agoraphobia: Secondary | ICD-10-CM | POA: Diagnosis not present

## 2019-09-03 ENCOUNTER — Other Ambulatory Visit: Payer: Self-pay

## 2019-09-07 ENCOUNTER — Ambulatory Visit (INDEPENDENT_AMBULATORY_CARE_PROVIDER_SITE_OTHER): Payer: PPO | Admitting: Endocrinology

## 2019-09-07 ENCOUNTER — Other Ambulatory Visit: Payer: Self-pay

## 2019-09-07 ENCOUNTER — Encounter: Payer: Self-pay | Admitting: Endocrinology

## 2019-09-07 VITALS — BP 118/68 | HR 85 | Temp 98.2°F | Ht 68.0 in | Wt 133.0 lb

## 2019-09-07 DIAGNOSIS — E1043 Type 1 diabetes mellitus with diabetic autonomic (poly)neuropathy: Secondary | ICD-10-CM | POA: Diagnosis not present

## 2019-09-07 DIAGNOSIS — E1065 Type 1 diabetes mellitus with hyperglycemia: Secondary | ICD-10-CM

## 2019-09-07 DIAGNOSIS — K3184 Gastroparesis: Secondary | ICD-10-CM

## 2019-09-07 DIAGNOSIS — F410 Panic disorder [episodic paroxysmal anxiety] without agoraphobia: Secondary | ICD-10-CM

## 2019-09-07 DIAGNOSIS — F411 Generalized anxiety disorder: Secondary | ICD-10-CM | POA: Diagnosis not present

## 2019-09-07 LAB — POCT GLYCOSYLATED HEMOGLOBIN (HGB A1C): Hemoglobin A1C: 9.2 % — AB (ref 4.0–5.6)

## 2019-09-07 MED ORDER — FREESTYLE PRECISION NEO SYSTEM W/DEVICE KIT: 1.0000 | PACK | Freq: Two times a day (BID) | 0 refills | Status: DC

## 2019-09-07 MED ORDER — FREESTYLE PRECISION NEO TEST VI STRP: ORAL_STRIP | 12 refills | Status: DC

## 2019-09-07 NOTE — Progress Notes (Signed)
Patient ID: Ashley Armstrong, female   DOB: Dec 19, 1965, 53 y.o.   MRN: [id number]           Reason for Appointment: Follow-up for Type 1 Diabetes  Referring physician: Dr. [name]   History of Present Illness:          Date of diagnosis of type 1 diabetes mellitus: Age 6       Background history:  She has been on insulin practically since her diagnosis was made.  Initially she had symptoms of weight loss and blurred vision For several years has been on Lantus and NovoLog but usually has had poor control with A1c mostly between 8-10%  Recent history:     INSULIN regimen is:  Lantus 21--32 breakfast and pm.  NovoLog mostly 12-14 units before meals       Current management, blood sugar patterns and problems identified: Her A1c is slightly better at 9.2 compared to 9.8    She was told to increase her morning dose of Lantus on her last visit but she has not changes  Also was told to try [place] but she thinks it was too expensive or not covered and did not change  She is not able to watch her diet because of stress and anxiety  With this her blood sugars probably are significantly high late at night when she is eating snacks or sandwiches without any bolus coverage  She was told to try and keep a diary of her meals and insulin and she did try doing this but did not bring any record  Use of her freestyle Elenor Legato is intermittent and incomplete data available currently           Meal times are:  Breakfast is 7 AM at Lunch:  PM dinner:3 PM  Typical meal intake: Breakfast is sometimes high fat but otherwise eggs and toast.  Lunch usually a mixed meal and evening meal may be a sandwich with bland food.  Snacks will be cheese and crackers Usually avoiding drinks with sugar             Exercise: None    Glucose monitoring:  done 4 times a day         Glucometer:  Freestyle libre.       Blood Glucose readings by time of day and averages from download   CONTINUOUS GLUCOSE  MONITORING RECORD INTERPRETATION    Dates of Recording: 10/14 through 10/27  Sensor description: [employer]  Results statistics:   CGM use % of time  48  Average and SD  228  Time in range  23      %  % Time Above 180  45  % Time above 250  31  % Time Below target  1    Glycemic patterns summary: Blood sugar data was absent on 2 of her 14 days and incomplete on most of the days due to inadequate monitoring Most of the HYPERGLYCEMIA is present late in the evening after 8 PM and lasting until at least midnight Also has variable hyperglycemia late morning  Hyperglycemic episodes are related to her food intake late in the evening and also variably after breakfast in the mornings  Hypoglycemic episodes are minimal with only 1% of the readings below target early morning likely from overcorrection of her high reading the night before  Overnight periods: Blood sugars are averaging about 260 at midnight and on an average decreasing down to about 180-190 by morning.  More  variability present in the early hours of the night  Preprandial periods: Blood sugars are variably high in the morning, usually averaging over 200 at her main meal in the mid afternoon  Postprandial periods:   After breakfast: Blood sugars are sometimes significantly going up to over 300 but all other days fairly flat     After afternoon meal: Data is incomplete but on an average blood sugars appear to be relatively flat before and after his meals   PRE-MEAL Fasting Lunch Dinner Bedtime Overall  Glucose range:       Mean/median:  199  244    215  228   POST-MEAL PC Breakfast PC Lunch PC 9 PM snack  Glucose range:     Mean/median:  252  240  273       CONTINUOUS GLUCOSE MONITORING RECORD INTERPRETATION    Dates of Recording: 7/14 through 7/27  Sensor description: [employer]  Results statistics:   CGM use % of time  69  Average and SD  236, GV 27  Time in range       17 %  % Time Above 180   43  % Time above 250  39  % Time Below target  1    Glycemic patterns summary: Blood sugar data is incomplete as she did not use her sensor for 3 days Although she does have moderate variability her blood sugars are fairly consistently above her target range except early morning but even then they are not usually near normal HIGHEST blood sugars are generally between 2 PM-midnight  Hyperglycemic episodes are present throughout the day with significant spikes at times in the morning after 6 AM and periodically the rest of the day and into the night  Hypoglycemic episodes have not been occurring except low normal readings around 4 AM  Overnight periods: Blood sugars are higher to start with but generally come down to different extent in the mornings with only one low normal reading  Preprandial periods: Blood sugars are averaging about 170 before breakfast She really is not eating a lunch Blood sugars are still over 200 before her main meal in the late afternoon  Postprandial periods:   After breakfast: Blood sugars are usually very consistently going up regardless of her intake although today with eating a balanced meal her blood sugar was only 140 in the office  After dinner: After her main meal around 3-4 PM blood sugars are moderately higher but occasionally can go up as much as 150 mg   PRE-MEAL Fasting Lunch Dinner Bedtime Overall  Glucose range:       Mean/median:  173  235 252  259  236   POST-MEAL PC Breakfast PC Lunch PC Dinner  Glucose range:     Mean/median:  257    284      Dietician visit, most recent: 11/18  Weight history:  Wt Readings from Last 3 Encounters:  09/07/19 133 lb (60.3 kg)  06/07/19 133 lb 6.4 oz (60.5 kg)  03/18/19 135 lb (61.2 kg)    Glycemic control:   Lab Results  Component Value Date   HGBA1C 9.2 (A) 09/07/2019   HGBA1C 9.8 (A) 06/07/2019   HGBA1C 10.1 (H) 12/17/2018   Lab Results  Component Value Date   MICROALBUR 1.7  07/26/2019   LDLCALC 77 07/26/2019   CREATININE 1.12 (H) 07/26/2019   Lab Results  Component Value Date   MICRALBCREAT 12 07/26/2019    No results found for: FRUCTOSAMINE  Office Visit on 09/07/2019  Component Date Value Ref Range Status   Hemoglobin A1C 09/07/2019 9.2* 4.0 - 5.6 % Final    Allergies as of 09/07/2019   No Known Allergies     Medication List       Accurate as of September 07, 2019  8:39 PM. If you have any questions, ask your nurse or doctor.        busPIRone 10 MG tablet Commonly known as: BUSPAR Take 5 mg by mouth 2 (two) times daily.   calcium-vitamin D 500-200 MG-UNIT Tabs tablet Commonly known as: OSCAL WITH D Take 1 tablet by mouth daily.   Durezol 0.05 % Emul Generic drug: Difluprednate Apply 1 drop to eye daily at 8 pm.   Fish Oil 1000 MG Caps Take 1 capsule by mouth daily.   FreeStyle Libre 14 Day Reader Kerrin Mo 1 each by Other route See admin instructions. Change sensor every 2 weeks   FreeStyle Libre 14 Day Sensor Misc USE AS DIRECTED EVERY 14 DAYS.   FreeStyle Precision Neo System w/Device Kit 1 kit by Does not apply route 2 (two) times daily. Started by: Amber m Naval architect, CMA   FreeStyle Precision Neo Test test strip Generic drug: glucose blood Check blood sugar twice daily What changed: additional instructions Changed by: Amber m Naval architect, CMA   Lantus SoloStar 100 UNIT/ML Solostar Pen Generic drug: Insulin Glargine Inject 31 units under the skin in the morning and 21 units in the evening.   lisinopril 2.5 MG tablet Commonly known as: ZESTRIL TAKE ONE (1) TABLET BY MOUTH EVERY DAY   metoCLOPramide 5 MG tablet Commonly known as: REGLAN TAKE ONE TO TWO TABLETS BY MOUTH EVERY SIX HOURS AS NEEDED FOR NAUSEAOR VOMITING   mirtazapine 30 MG tablet Commonly known as: REMERON Take 1 tablet by mouth daily at 8 pm.   montelukast 10 MG tablet Commonly known as: SINGULAIR Take 1 tablet (10 mg total) by mouth at bedtime.   NovoLOG  FlexPen 100 UNIT/ML FlexPen Generic drug: insulin aspart INJECT 8 TO 12 UNITS SUBCUTANEOUSLY BEFORE EACH MEAL AS DIRECTED. ADJUST PERSLIDING SCALE DEPENDING ON SUGAR READING. MAX DOSE PER DAY IS 51   omeprazole 40 MG capsule Commonly known as: PRILOSEC TAKE ONE CAPSULE BY MOUTH 30 MINUTES PRIOR TO BREAKFAST AND SUPPER   ondansetron 4 MG tablet Commonly known as: ZOFRAN TAKE ONE TABLET BY MOUTH EVERY EIGHT HOURS AS NEEDED FOR NAUSEA OR VOMITING   OneTouch Delica Lancets 56P Misc USE FOUR TIMES A DAY AS DIRECTED.   Pen Needles 30G X 5 MM Misc Inject 1 each into the skin 5 (five) times daily. Use pen needles to inject insulin 5 times daily.   polyethylene glycol 17 g packet Commonly known as: MIRALAX / GLYCOLAX Take 17 g by mouth daily as needed.   pravastatin 80 MG tablet Commonly known as: PRAVACHOL Take 1 tablet (80 mg total) by mouth every evening.   Valium 10 MG tablet Generic drug: diazepam Take one (1) Tablet Once a Day, As Necessary for panic   vitamin B-12 100 MCG tablet Commonly known as: CYANOCOBALAMIN Take 1 tablet by mouth daily.       Allergies: No Known Allergies  Past Medical History:  Diagnosis Date   Anxiety    Dyslipidemia    Gastroparesis due to DM ([place]) OCT 2014   75% AT 2 HRS, GLU >    H/O eye surgery 01/2018   Headache(784.0)    IDDM (insulin dependent diabetes mellitus)  Neuropathy    Nicotine addiction    TIA (transient ischemic attack)    Dr. Merlene Laughter    Past Surgical History:  Procedure Laterality Date   Cataract surgery Bilateral 01/20/2017, 02/03/2017   COLONOSCOPY N/A 05/09/2016   Procedure: COLONOSCOPY;  Surgeon: Danie Binder, MD;  Location: [facility];  Service: Endoscopy;  Laterality: N/A;  130   ESOPHAGOGASTRODUODENOSCOPY N/A 08/31/2013   SLF: 1. Earky satiety nausea may be due to Gastroparesis/pyloric channel stenosis. 2. small hiatal hernia 3. Moderate erosive gastritis.    EYE SURGERY  01/2018   laser  surgery bilateral eye      Family History  Problem Relation Age of Onset   Hypertension Mother    Diabetes Mother    Hyperlipidemia Mother    Rosacea Mother    Hypertension Father    Diabetes Brother    Breast cancer Maternal Aunt    Colon cancer Neg Hx     Social History:  reports that she quit smoking about 8 years ago. Her smoking use included cigarettes. She has a 36.00 pack-year smoking history. She has never used smokeless tobacco. She reports that she does not drink alcohol or use drugs.   Review of Systems   Lipid history: Followed by PCP, currently on pravastatin 80 mg Labs as follows    Lab Results  Component Value Date   CHOL 154 07/26/2019   HDL 59 07/26/2019   LDLCALC 77 07/26/2019   TRIG 94 07/26/2019   CHOLHDL 2.6 07/26/2019           Hypertension: This is well controlled  BP Readings from Last 3 Encounters:  09/07/19 118/68  06/07/19 110/70  03/18/19 114/66    Most recent eye exam was in 01/2019  Most recent foot exam: 9/19  GASTROPARESIS: Recently her nausea is well controlled but may have some bloating at times including in the mornings Followed by gastroenterologist  She is trying to take her metoclopramide before eating most of the time  Currently known complications of diabetes: Gastroparesis, neuropathy, retinopathy, no nephropathy  LABS:  Office Visit on 09/07/2019  Component Date Value Ref Range Status   Hemoglobin A1C 09/07/2019 9.2* 4.0 - 5.6 % Final    Physical Examination:  BP 118/68 (BP Location: Left Arm, Patient Position: Sitting, Cuff Size: Normal)    Pulse 85    Temp 98.2 F (36.8 C) (Oral)    Ht _0  (1.727 m)    Wt 133 lb (60.3 kg)    LMP 07/26/2013    SpO2 98%    BMI 20.22 kg/m        ASSESSMENT:  Diabetes type 1, poorly controlled with multiple complications  Her C1Y has only slightly improved at 9.2  She is having significant hyperglycemia most of the time as seen on her CGM This is likely from  inadequate basal insulin most of the time but also inadequate mealtime coverage as discussed above She has been reluctant to adjust her insulin as directed Also not covering all meals and snacks especially late at night with bolusing insulin She has been still refusing to consider insulin pump  Also with her anxiety she is not able to watch her diet Postprandial reading may be also variable because of gastroparesis   GASTROPARESIS: On Reglan, well controlled and may take occasional extra Reglan as needed   PLAN:    She needs to understand that the freestyle Elenor Legato does not keep that on for more than 8 hours and she will  need to check blood sugars at least 3-4 times a day at different times  Continues to take 6 to 10 units of NovoLog at least for her late evening snacks or sandwiches  Increase morning Lantus to at least 25 and take higher doses if her sugar before suppertime is still high  We will continue same dose of Lantus at night  Although she was recommended [place] again and this may be covered by insurance she refuses to change at this time  Discussed trying to keep her readings after meals at least under 200 and preferably under 160 Review management again in 2 months Likely needs better diabetes education also  For her anxiety and depression management she will be referred to local psychiatrist at her request  Counseling time on subjects discussed in assessment and plan sections is over 50% of today's 25 minute visit  Patient Instructions  Lantus 25 in am and 32 at nite  At nite take 6-10 units Novolog  for large snacks  Check sugar 4-5x daily     Elayne Snare 09/07/2019, 8:39 PM   Note: This office note was prepared with Dragon voice recognition system technology. Any transcriptional errors that result from this process are unintentional.

## 2019-09-07 NOTE — Patient Instructions (Addendum)
Lantus 25 in am and 32 at nite  At nite take 6-10 units Novolog  for large snacks  Check sugar 4-5x daily

## 2019-09-08 ENCOUNTER — Ambulatory Visit (INDEPENDENT_AMBULATORY_CARE_PROVIDER_SITE_OTHER): Payer: PPO | Admitting: Gastroenterology

## 2019-09-08 ENCOUNTER — Encounter: Payer: Self-pay | Admitting: Gastroenterology

## 2019-09-08 ENCOUNTER — Ambulatory Visit: Payer: PPO | Admitting: Nurse Practitioner

## 2019-09-08 DIAGNOSIS — K3184 Gastroparesis: Secondary | ICD-10-CM | POA: Diagnosis not present

## 2019-09-08 DIAGNOSIS — K5900 Constipation, unspecified: Secondary | ICD-10-CM

## 2019-09-08 DIAGNOSIS — K219 Gastro-esophageal reflux disease without esophagitis: Secondary | ICD-10-CM | POA: Diagnosis not present

## 2019-09-08 NOTE — Progress Notes (Signed)
Primary Care Physician:  Fayrene Helper, MD Primary GI:  Barney Drain, MD    Patient Location: Home  Provider Location: [facility] office  Reason for Visit: abd pain, nausea  Persons present on the virtual encounter, with roles: Patient, myself (provider),Mindy Estidullo(updated meds and allergies)  Total time (minutes) spent on medical discussion: 15 minutes  Due to COVID-19, visit was conducted using [email or web address] method.  Visit was requested by patient.  Virtual Visit via [email or web address]  I connected with Ashley Armstrong on 09/08/19 at 10:00 AM EDT by [email or web address] and verified that I am speaking with the correct person using two identifiers.   I discussed the limitations, risks, security and privacy concerns of performing an evaluation and management service by telephone/video and the availability of in person appointments. I also discussed with the patient that there may be a patient responsible charge related to this service. The patient expressed understanding and agreed to proceed.  Chief Complaint  Patient presents with  . Abdominal Pain    "every now and then flank area"  . Nausea    "every now and then"    HPI:   Ashley Armstrong is a 53 y.o. female who presents for virtual visit regarding abdominal pain, nausea.  Patient last seen in April 2020 by virtual visit.  History of constipation, GERD, gastroparesis, left lower quadrant pain.  Last colonoscopy in 2017, she had 2 hyperplastic polyps removed.  Next colonoscopy due in 10 years. EGD in 2014 with pyloric channel stenosis s/p dilation, gastritis.   Overall stable. Overeats sometimes and "pays for it". Takes reglan twice a day. No side effects. If she doesn't take it then she feels bloated and nauseated. No vomiting. Heartburn well controlled on current regimen. BM from regular, to constipation, to diarrhea. Taking Miralax only when needed. Ok with way things are going. No melena. Only with brbpr on toilet tissue with straining on  occasion. Some vague llq pain possibly related to constipation. Comes and goes, evaluated last year with CT and unremarkable.   A lot of stress lately. Roommate suddenly died. Aunt passed three days later. DM not well controlled. A1C 9.8 in 05/2019 and down to 9.2 yesterday.    Current Outpatient Medications  Medication Sig Dispense Refill  . Blood Glucose Monitoring Suppl (FREESTYLE PRECISION NEO SYSTEM) w/Device KIT 1 kit by Does not apply route 2 (two) times daily. 1 kit 0  . busPIRone (BUSPAR) 10 MG tablet Take 5 mg by mouth 2 (two) times daily.    . calcium-vitamin D (OSCAL WITH D) 500-200 MG-UNIT TABS tablet Take 1 tablet by mouth daily.    . Continuous Blood Gluc Receiver (FREESTYLE LIBRE 14 DAY READER) DEVI 1 each by Other route See admin instructions. Change sensor every 2 weeks 3 Device 0  . Continuous Blood Gluc Sensor (FREESTYLE LIBRE 14 DAY SENSOR) MISC USE AS DIRECTED EVERY 14 DAYS. 2 each 0  . diazepam (VALIUM) 10 MG tablet Take one (1) Tablet Once a Day, As Necessary for panic    . DUREZOL 0.05 % EMUL Apply 1 drop to eye daily at 8 pm.    . glucose blood (FREESTYLE PRECISION NEO TEST) test strip Check blood sugar twice daily 100 each 12  . Insulin Glargine (LANTUS SOLOSTAR) 100 UNIT/ML Solostar Pen Inject 31 units under the skin in the morning and 21 units in the evening. 45 mL 0  . Insulin Pen Needle (PEN NEEDLES) 30G X 5 MM MISC Inject 1  each into the skin 5 (five) times daily. Use pen needles to inject insulin 5 times daily. 450 each 1  . lisinopril (ZESTRIL) 2.5 MG tablet TAKE ONE (1) TABLET BY MOUTH EVERY DAY 90 tablet 1  . metoCLOPramide (REGLAN) 5 MG tablet TAKE ONE TO TWO TABLETS BY MOUTH EVERY SIX HOURS AS NEEDED FOR NAUSEAOR VOMITING 120 tablet 5  . mirtazapine (REMERON) 30 MG tablet Take 1 tablet by mouth daily at 8 pm.    . montelukast (SINGULAIR) 10 MG tablet Take 1 tablet (10 mg total) by mouth at bedtime. 90 tablet 1  . NOVOLOG FLEXPEN 100 UNIT/ML FlexPen INJECT 8  TO 12 UNITS SUBCUTANEOUSLY BEFORE EACH MEAL AS DIRECTED. ADJUST PERSLIDING SCALE DEPENDING ON SUGAR READING. MAX DOSE PER DAY IS 51 45 mL 1  . Omega-3 Fatty Acids (FISH OIL) 1000 MG CAPS Take 1 capsule by mouth daily.    Marland Kitchen omeprazole (PRILOSEC) 40 MG capsule TAKE ONE CAPSULE BY MOUTH 30 MINUTES PRIOR TO BREAKFAST AND SUPPER 180 capsule 3  . ondansetron (ZOFRAN) 4 MG tablet TAKE ONE TABLET BY MOUTH EVERY EIGHT HOURS AS NEEDED FOR NAUSEA OR VOMITING 60 tablet 3  . ONETOUCH DELICA LANCETS 46K MISC USE FOUR TIMES A DAY AS DIRECTED. 100 each 1  . polyethylene glycol (MIRALAX / GLYCOLAX) 17 g packet Take 17 g by mouth daily as needed.    . pravastatin (PRAVACHOL) 80 MG tablet Take 1 tablet (80 mg total) by mouth every evening. 90 tablet 1  . vitamin B-12 (CYANOCOBALAMIN) 100 MCG tablet Take 1 tablet by mouth daily.     No current facility-administered medications for this visit.     Past Medical History:  Diagnosis Date  . Anxiety   . Dyslipidemia   . Gastroparesis due to DM ([place]) OCT 2014   75% AT 2 HRS, GLU >   . H/O eye surgery 01/2018  . Headache(784.0)   . IDDM (insulin dependent diabetes mellitus)   . Neuropathy   . Nicotine addiction   . TIA (transient ischemic attack)    Dr. Merlene Laughter    Past Surgical History:  Procedure Laterality Date  . Cataract surgery Bilateral 01/20/2017, 02/03/2017  . COLONOSCOPY N/A 05/09/2016   Procedure: COLONOSCOPY;  Surgeon: Danie Binder, MD;  Location: [facility];  Service: Endoscopy;  Laterality: N/A;  130  . ESOPHAGOGASTRODUODENOSCOPY N/A 08/31/2013   SLF: 1. Earky satiety nausea may be due to Gastroparesis/pyloric channel stenosis. 2. small hiatal hernia 3. Moderate erosive gastritis.   Marland Kitchen EYE SURGERY  01/2018  . laser surgery bilateral eye      Family History  Problem Relation Age of Onset  . Hypertension Mother   . Diabetes Mother   . Hyperlipidemia Mother   . Rosacea Mother   . Hypertension Father   . Diabetes Brother   . Breast  cancer Maternal Aunt   . Colon cancer Neg Hx     Social History   Socioeconomic History  . Marital status: Single    Spouse name: Not on file  . Number of children: Not on file  . Years of education: Not on file  . Highest education level: Not on file  Occupational History  . Occupation: disabled  Social Needs  . Financial resource strain: Not very hard  . Food insecurity    Worry: Sometimes true    Inability: Sometimes true  . Transportation needs    Medical: No    Non-medical: No  Tobacco Use  . Smoking status:  Former Smoker    Packs/day: 1.50    Years: 24.00    Pack years: 36.00    Types: Cigarettes    Quit date: 08/24/2011    Years since quitting: 8.0  . Smokeless tobacco: Never Used  Substance and Sexual Activity  . Alcohol use: No  . Drug use: No  . Sexual activity: Not Currently    Birth control/protection: None  Lifestyle  . Physical activity    Days per week: 2 days    Minutes per session: 20 min  . Stress: Very much  Relationships  . Social connections    Talks on phone: More than three times a week    Gets together: More than three times a week    Attends religious service: More than 4 times per year    Active member of club or organization: No    Attends meetings of clubs or organizations: Never    Relationship status: Never married  . Intimate partner violence    Fear of current or ex partner: No    Emotionally abused: No    Physically abused: No    Forced sexual activity: No  Other Topics Concern  . Not on file  Social History Narrative   Disabled, lives with a room mate      ROS:  General: Negative for anorexia, weight loss, fever, chills, fatigue, weakness. Eyes: Negative for vision changes.  ENT: Negative for hoarseness, difficulty swallowing , nasal congestion. CV: Negative for chest pain, angina, palpitations, dyspnea on exertion, peripheral edema.  Respiratory: Negative for dyspnea at rest, dyspnea on exertion, cough, sputum,  wheezing.  GI: See history of present illness. GU:  Negative for dysuria, hematuria, urinary incontinence, urinary frequency, nocturnal urination.  MS: Negative for joint pain, low back pain.  Derm: Negative for rash or itching.  Neuro: Negative for weakness, abnormal sensation, seizure, frequent headaches, memory loss, confusion.  Psych: Negative for anxiety, depression, suicidal ideation, hallucinations.  Endo: Negative for unusual weight change.  Heme: Negative for bruising or bleeding. Allergy: Negative for rash or hives.   Observations/Objective: Pleasant, well-nourished, no acute distress.  No signs of respiratory issues.  Otherwise exam unavailable.  Lab Results  Component Value Date   TSH 1.53 07/26/2019   Lab Results  Component Value Date   CREATININE 1.12 (H) 07/26/2019   BUN 18 07/26/2019   NA 139 07/26/2019   K 5.1 07/26/2019   CL 107 07/26/2019   CO2 26 07/26/2019   Lab Results  Component Value Date   ALT 12 07/26/2019   AST 14 07/26/2019   ALKPHOS 88 05/18/2018   BILITOT 0.4 07/26/2019   Lab Results  Component Value Date   WBC 7.6 05/18/2018   HGB 12.4 05/18/2018   HCT 38.0 05/18/2018   MCV 88.2 05/18/2018   PLT 253 05/18/2018   Lab Results  Component Value Date   HGBA1C 9.2 (A) 09/07/2019    Assessment and Plan: Pleasant 53 year old female presenting for follow-up of constipation, gastroparesis, GERD, left lower quadrant pain.  She has been stable.  Her reflux is well controlled.  Bowel movements tend to switch from constipation to diarrhea, currently using MiraLAX as needed.  Acceptable with the way things are working.  Continues to have some early satiety, takes Reglan twice daily to help control symptoms.  She admits that she is always careful with avoiding overeating.  Sometimes she indulges and "pays for it".  Recommend small portions 5-6 times per day.  Encouraged better management of diabetes.  Continue PPI twice daily.  Continue MiraLAX daily as  needed.  She will let me know if she has any new symptoms or change in symptoms.  Otherwise she seems to be very stable with respect to her gastroparesis, GERD, constipation.  Follow Up Instructions:    I discussed the assessment and treatment plan with the patient. The patient was provided an opportunity to ask questions and all were answered. The patient agreed with the plan and demonstrated an understanding of the instructions. AVS mailed to patient's home address.   The patient was advised to call back or seek an in-person evaluation if the symptoms worsen or if the condition fails to improve as anticipated.  I provided 15 minutes of virtual face-to-face time during this encounter.   Neil Crouch, PA-C

## 2019-09-08 NOTE — Patient Instructions (Signed)
1. For gastroparesis, continue Reglan at the lowest dose that she can get by with to avoid potential side effects.  Better management of diabetes will help improve gastroparesis symptoms.  Try eating 5-6 small portions daily.  Do not overeat, avoid greasy/fried/rich foods.  These are hard to digest. 2. Continue MiraLAX 1/2-1 capful daily or every other day as needed for management of constipation.  Titrate your dose accordingly, to avoid diarrhea. 3. Continue omeprazole twice daily before meals for management of your reflux. 4. Call if you have any change in symptoms, worsening symptoms, unintentional weight loss. 5. We will see back in 6 months.  Call sooner if needed.

## 2019-09-08 NOTE — Progress Notes (Signed)
CC'ED TO PCP 

## 2019-09-09 ENCOUNTER — Encounter: Payer: Self-pay | Admitting: Gastroenterology

## 2019-09-10 ENCOUNTER — Other Ambulatory Visit: Payer: Self-pay

## 2019-09-10 MED ORDER — FREESTYLE LIBRE 14 DAY SENSOR MISC: 1.0000 | 3 refills | Status: DC

## 2019-09-14 ENCOUNTER — Encounter: Payer: PPO | Admitting: Family Medicine

## 2019-09-15 ENCOUNTER — Telehealth: Payer: Self-pay

## 2019-09-15 ENCOUNTER — Other Ambulatory Visit: Payer: Self-pay

## 2019-09-15 MED ORDER — FREESTYLE LIBRE 14 DAY READER DEVI: 1.0000 | 0 refills | Status: DC

## 2019-09-15 NOTE — Telephone Encounter (Signed)
Patient called in stating that office she was referred to did not take her insurance.     Please call and advise

## 2019-09-16 NOTE — Telephone Encounter (Signed)
This has been resolved-new referral faxed to [facility] which does take [facility]-they will call patient for scheduling

## 2019-10-01 ENCOUNTER — Other Ambulatory Visit: Payer: Self-pay

## 2019-10-01 DIAGNOSIS — Z20822 Contact with and (suspected) exposure to covid-19: Secondary | ICD-10-CM

## 2019-10-04 LAB — NOVEL CORONAVIRUS, NAA: SARS-CoV-2, NAA: NOT DETECTED

## 2019-10-05 ENCOUNTER — Other Ambulatory Visit: Payer: Self-pay | Admitting: Family Medicine

## 2019-10-06 DIAGNOSIS — F410 Panic disorder [episodic paroxysmal anxiety] without agoraphobia: Secondary | ICD-10-CM | POA: Diagnosis not present

## 2019-10-14 ENCOUNTER — Ambulatory Visit (HOSPITAL_COMMUNITY)
Admission: RE | Admit: 2019-10-14 | Discharge: 2019-10-14 | Disposition: A | Discharge status: Home / Self Care | Payer: PPO | Source: Ambulatory Visit | Attending: Family Medicine | Admitting: Family Medicine

## 2019-10-14 ENCOUNTER — Other Ambulatory Visit: Payer: Self-pay

## 2019-10-14 DIAGNOSIS — Z1231 Encounter for screening mammogram for malignant neoplasm of breast: Secondary | ICD-10-CM | POA: Insufficient documentation

## 2019-10-26 ENCOUNTER — Other Ambulatory Visit: Payer: Self-pay

## 2019-10-26 ENCOUNTER — Ambulatory Visit (INDEPENDENT_AMBULATORY_CARE_PROVIDER_SITE_OTHER): Payer: PPO | Admitting: Family Medicine

## 2019-10-26 ENCOUNTER — Encounter: Payer: Self-pay | Admitting: Family Medicine

## 2019-10-26 VITALS — BP 118/74 | HR 82 | Temp 97.9°F | Resp 15 | Ht 68.0 in | Wt 138.4 lb

## 2019-10-26 DIAGNOSIS — E1140 Type 2 diabetes mellitus with diabetic neuropathy, unspecified: Secondary | ICD-10-CM

## 2019-10-26 DIAGNOSIS — E1042 Type 1 diabetes mellitus with diabetic polyneuropathy: Secondary | ICD-10-CM | POA: Diagnosis not present

## 2019-10-26 DIAGNOSIS — H6123 Impacted cerumen, bilateral: Secondary | ICD-10-CM | POA: Diagnosis not present

## 2019-10-26 DIAGNOSIS — Z0000 Encounter for general adult medical examination without abnormal findings: Secondary | ICD-10-CM

## 2019-10-26 DIAGNOSIS — Z794 Long term (current) use of insulin: Secondary | ICD-10-CM

## 2019-10-26 DIAGNOSIS — N3000 Acute cystitis without hematuria: Secondary | ICD-10-CM

## 2019-10-26 DIAGNOSIS — H9202 Otalgia, left ear: Secondary | ICD-10-CM

## 2019-10-26 LAB — POCT URINALYSIS DIP (CLINITEK)
Bilirubin, UA: NEGATIVE
Blood, UA: NEGATIVE
Glucose, UA: 500 mg/dL — AB
Ketones, POC UA: NEGATIVE mg/dL
Leukocytes, UA: NEGATIVE
Nitrite, UA: NEGATIVE
POC PROTEIN,UA: NEGATIVE
Spec Grav, UA: 1.025 (ref 1.010–1.025)
Urobilinogen, UA: 1 E.U./dL
pH, UA: 6 (ref 5.0–8.0)

## 2019-10-26 NOTE — Patient Instructions (Addendum)
F/U in office with MD in May, call if you need me sooner  Please get toenail  attended to  Fasting lipid, cmp and EGFR, HBA1C, CBC 1 week before follow up  You will be referred to ENT re left ear pain and impacted cerumen  Foot exam shows very poor sensation so need to examine daily  Youurine is being tested for infection today   

## 2019-10-26 NOTE — Progress Notes (Signed)
Ashley Armstrong     MRN: [id number]      DOB: 1966-04-19  HPI: Patient is in for annual physical exam. ribnging in left ear and intermittent pain x 1 week Dysuria and frequency x 2 days, denies flank pain, fever or chills Recent labs, if available are reviewed. Immunization is reviewed , and  updated if needed.   PE: BP 118/74   Pulse 82   Temp 97.9 F (36.6 C) (Temporal)   Resp 15   Ht 5\' 8"  (1.727 m)   Wt 138 lb 6.4 oz (62.8 kg)   LMP 07/26/2013   SpO2 98%   BMI 21.04 kg/m   Pleasant  female, alert and oriented x 3, in no cardio-pulmonary distress. Afebrile. HEENT No facial trauma or asymetry. Sinuses non tender.  Extra occullar muscles intact.. External ears normal, .left tM occluded with poor light reflex, right tM clear, partially ocluded Neck: supple, no adenopathy,JVD or thyromegaly.No bruits.  Chest: Clear to ascultation bilaterally.No crackles or wheezes. Non tender to palpation  Breast: Normal mammogram 10/14/2019, no physical exam in office  Cardiovascular system; Heart sounds normal,  S1 and  S2 ,no S3.  No murmur, or thrill. Apical beat not displaced Peripheral pulses normal.  Abdomen: Soft, non tender, no organomegaly or masses. No bruits. Bowel sounds normal. No guarding, tenderness or rebound.   GU: Not examined  Musculoskeletal exam: Full ROM of spine, hips , shoulders and knees. No deformity ,swelling or crepitus noted. No muscle wasting or atrophy.   Neurologic: Cranial nerves 2 to 12 intact. Power, tone , normal throughout.decreased sensation in feet No disturbance in gait. No tremor.  Skin: Intact, no ulceration, erythema , scaling or rash noted.Marked onychomucosis right great toenail Pigmentation normal throughout  Psych; Normal mood and affect. Judgement and concentration normal   Assessment & Plan:  Annual physical exam Annual exam as documented. Counseling done  re healthy lifestyle involving commitment to 150  minutes exercise per week, heart healthy diet, and attaining healthy weight.The importance of adequate sleep also discussed. Regular seat belt use and home safety, is also discussed. Changes in health habits are decided on by the patient with goals and time frames  set for achieving them. Immunization and cancer screening needs are specifically addressed at this visit.   Acute cystitis without hematuria Symptomatic, CCUA negative for infection positive for glucosuria   Cerumen debris on tympanic membrane Left greater than right with otalgia, refer to ENT  Otalgia, left Refer ENT for eval and management  Type 2 diabetes mellitus with diabetic neuropathy, unspecified ([place]) Ashley Armstrong is reminded of the importance of commitment to daily physical activity for 30 minutes or more, as able and the need to limit carbohydrate intake to 30 to 60 grams per meal to help with blood sugar control.   The need to take medication as prescribed, test blood sugar as directed, and to call between visits if there is a concern that blood sugar is uncontrolled is also discussed.   Ashley Armstrong is reminded of the importance of daily foot exam, annual eye examination, and good blood sugar, blood pressure and cholesterol control.  Diabetic Labs Latest Ref Rng & Units 09/07/2019 07/26/2019 06/07/2019 12/17/2018 11/26/2018  HbA1c 4.0 - 5.6 % 9.2(A) - 9.8(A) 10.1(H) 8.6(A)  Microalbumin mg/dL - 1.7 - - -  Micro/Creat Ratio <30 mcg/mg creat - 12 - - -  Chol <200 mg/dL - 154 - 196 -  HDL > OR = 50 mg/dL - 59 -  67 -  Calc LDL mg/dL (calc) - 77 - 109(H) -  Triglycerides <150 mg/dL - 94 - 105 -  Creatinine 0.50 - 1.05 mg/dL - 1.12(H) - 1.06(H) -   BP/Weight 10/26/2019 09/07/2019 06/07/2019 03/18/2019 03/08/2019 11/26/2018 [id number]  Systolic BP 123456 123456 [id number]  Diastolic BP 74 68 70 66 66 62 60  Wt. (Lbs) 138.4 133 133.4 135 136 136.2 135  BMI 21.04 20.22 20.28 20.53 20.68 20.71 20.53   Foot/eye exam completion  dates Latest Ref Rng & Units 10/26/2019 07/31/2018  Eye Exam No Retinopathy - -  Foot Form Completion - Done Done   Uncontrolled, mnaged by Endo, reports improvement

## 2019-10-27 ENCOUNTER — Encounter: Payer: Self-pay | Admitting: Family Medicine

## 2019-10-27 DIAGNOSIS — H6120 Impacted cerumen, unspecified ear: Secondary | ICD-10-CM | POA: Insufficient documentation

## 2019-10-27 DIAGNOSIS — H9202 Otalgia, left ear: Secondary | ICD-10-CM | POA: Insufficient documentation

## 2019-10-27 DIAGNOSIS — N3000 Acute cystitis without hematuria: Secondary | ICD-10-CM | POA: Insufficient documentation

## 2019-10-27 NOTE — Assessment & Plan Note (Signed)
Symptomatic, CCUA negative for infection positive for glucosuria

## 2019-10-27 NOTE — Assessment & Plan Note (Signed)

## 2019-10-27 NOTE — Assessment & Plan Note (Signed)
Refer ENT for eval and management

## 2019-10-27 NOTE — Assessment & Plan Note (Addendum)
Left greater than right with otalgia, refer to ENT

## 2019-10-27 NOTE — Assessment & Plan Note (Signed)
Ashley Armstrong is reminded of the importance of commitment to daily physical activity for 30 minutes or more, as able and the need to limit carbohydrate intake to 30 to 60 grams per meal to help with blood sugar control.   The need to take medication as prescribed, test blood sugar as directed, and to call between visits if there is a concern that blood sugar is uncontrolled is also discussed.   Ashley Armstrong is reminded of the importance of daily foot exam, annual eye examination, and good blood sugar, blood pressure and cholesterol control.  Diabetic Labs Latest Ref Rng & Units 09/07/2019 07/26/2019 06/07/2019 12/17/2018 11/26/2018  HbA1c 4.0 - 5.6 % 9.2(A) - 9.8(A) 10.1(H) 8.6(A)  Microalbumin mg/dL - 1.7 - - -  Micro/Creat Ratio <30 mcg/mg creat - 12 - - -  Chol <200 mg/dL - 154 - 196 -  HDL > OR = 50 mg/dL - 59 - 67 -  Calc LDL mg/dL (calc) - 77 - 109(H) -  Triglycerides <150 mg/dL - 94 - 105 -  Creatinine 0.50 - 1.05 mg/dL - 1.12(H) - 1.06(H) -   BP/Weight 10/26/2019 09/07/2019 06/07/2019 03/18/2019 03/08/2019 11/26/2018 [id number]  Systolic BP 123456 123456 [id number]  Diastolic BP 74 68 70 66 66 62 60  Wt. (Lbs) 138.4 133 133.4 135 136 136.2 135  BMI 21.04 20.22 20.28 20.53 20.68 20.71 20.53   Foot/eye exam completion dates Latest Ref Rng & Units 10/26/2019 07/31/2018  Eye Exam No Retinopathy - -  Foot Form Completion - Done Done   Uncontrolled, mnaged by Endo, reports improvement

## 2019-10-29 ENCOUNTER — Other Ambulatory Visit: Payer: Self-pay | Admitting: Endocrinology

## 2019-11-09 ENCOUNTER — Ambulatory Visit: Payer: PPO | Admitting: Endocrinology

## 2019-11-09 ENCOUNTER — Other Ambulatory Visit: Payer: Self-pay

## 2019-11-11 ENCOUNTER — Ambulatory Visit: Payer: PPO | Admitting: Family Medicine

## 2019-11-17 ENCOUNTER — Ambulatory Visit: Payer: PPO | Admitting: Podiatry

## 2019-11-17 ENCOUNTER — Other Ambulatory Visit: Payer: Self-pay

## 2019-11-17 ENCOUNTER — Encounter: Payer: Self-pay | Admitting: Podiatry

## 2019-11-17 DIAGNOSIS — B351 Tinea unguium: Secondary | ICD-10-CM | POA: Diagnosis not present

## 2019-11-17 DIAGNOSIS — E1142 Type 2 diabetes mellitus with diabetic polyneuropathy: Secondary | ICD-10-CM | POA: Diagnosis not present

## 2019-11-17 DIAGNOSIS — M79675 Pain in left toe(s): Secondary | ICD-10-CM | POA: Diagnosis not present

## 2019-11-17 DIAGNOSIS — M79674 Pain in right toe(s): Secondary | ICD-10-CM

## 2019-11-17 NOTE — Progress Notes (Signed)
Complaint:  Visit Type: Patient returns to my office for continued preventative foot care services. Complaint: Patient states" my nails have grown long and thick and become painful to walk and wear shoes" Patient has been diagnosed with DM with neuropathy.. The patient presents for preventative foot care services. No changes to ROS  Podiatric Exam: Vascular: dorsalis pedis and posterior tibial pulses are palpable bilateral. Capillary return is immediate. Temperature gradient is WNL. Skin turgor WNL  Sensorium: Absent  Semmes Weinstein monofilament test. Normal tactile sensation bilaterally. Nail Exam: Pt has thick disfigured discolored nails with subungual debris noted bilateral entire nail hallux through fifth toenails Ulcer Exam: There is no evidence of ulcer or pre-ulcerative changes or infection. Orthopedic Exam: Muscle tone and strength are WNL. No limitations in general ROM. No crepitus or effusions noted. Foot type and digits show no abnormalities. Bony prominences are unremarkable. Skin: No Porokeratosis. No infection or ulcers  Diagnosis:  Onychomycosis, , Pain in right toe, pain in left toes  Diabetes with neuropathy.  Treatment & Plan Procedures and Treatment: Consent by patient was obtained for treatment procedures.   Debridement of mycotic and hypertrophic toenails, 1 through 5 bilateral and clearing of subungual debris. No ulceration, no infection noted.  Return Visit-Office Procedure: Patient instructed to return to the office for a follow up visit 3 months for continued evaluation and treatment.    Anjela Cassara DPM 

## 2019-12-01 DIAGNOSIS — F410 Panic disorder [episodic paroxysmal anxiety] without agoraphobia: Secondary | ICD-10-CM | POA: Diagnosis not present

## 2019-12-03 DIAGNOSIS — H4321 Crystalline deposits in vitreous body, right eye: Secondary | ICD-10-CM | POA: Diagnosis not present

## 2019-12-03 DIAGNOSIS — E113593 Type 2 diabetes mellitus with proliferative diabetic retinopathy without macular edema, bilateral: Secondary | ICD-10-CM | POA: Diagnosis not present

## 2019-12-03 DIAGNOSIS — H43821 Vitreomacular adhesion, right eye: Secondary | ICD-10-CM | POA: Diagnosis not present

## 2019-12-03 DIAGNOSIS — H35371 Puckering of macula, right eye: Secondary | ICD-10-CM | POA: Diagnosis not present

## 2019-12-20 ENCOUNTER — Other Ambulatory Visit: Payer: Self-pay

## 2019-12-21 ENCOUNTER — Encounter: Payer: Self-pay | Admitting: Endocrinology

## 2019-12-21 ENCOUNTER — Other Ambulatory Visit: Payer: Self-pay | Admitting: Endocrinology

## 2019-12-21 ENCOUNTER — Other Ambulatory Visit: Payer: Self-pay

## 2019-12-21 ENCOUNTER — Ambulatory Visit (INDEPENDENT_AMBULATORY_CARE_PROVIDER_SITE_OTHER): Payer: PPO | Admitting: Endocrinology

## 2019-12-21 VITALS — BP 140/80 | HR 87 | Ht 68.0 in | Wt 134.6 lb

## 2019-12-21 DIAGNOSIS — F411 Generalized anxiety disorder: Secondary | ICD-10-CM | POA: Diagnosis not present

## 2019-12-21 DIAGNOSIS — I10 Essential (primary) hypertension: Secondary | ICD-10-CM

## 2019-12-21 DIAGNOSIS — F410 Panic disorder [episodic paroxysmal anxiety] without agoraphobia: Secondary | ICD-10-CM | POA: Diagnosis not present

## 2019-12-21 DIAGNOSIS — E1065 Type 1 diabetes mellitus with hyperglycemia: Secondary | ICD-10-CM | POA: Diagnosis not present

## 2019-12-21 LAB — POCT GLYCOSYLATED HEMOGLOBIN (HGB A1C): Hemoglobin A1C: 9.8 % — AB (ref 4.0–5.6)

## 2019-12-21 MED ORDER — NOVOLOG FLEXPEN 100 UNIT/ML ~~LOC~~ SOPN: PEN_INJECTOR | SUBCUTANEOUS | 1 refills | Status: DC

## 2019-12-21 MED ORDER — FREESTYLE LIBRE 2 SENSOR MISC: 1.0000 | 2 refills | Status: DC

## 2019-12-21 MED ORDER — FREESTYLE LIBRE 2 READER DEVI: 1.0000 | 0 refills | Status: DC

## 2019-12-21 NOTE — Patient Instructions (Addendum)
Lantus 28-30 units daily in am  Lantus 38 in pm to get am sugar in 90-130 range  Use 3 units Novolog  per 100 mg for high sugars   Walk daily or do indoor aerobics.

## 2019-12-21 NOTE — Progress Notes (Signed)
Patient ID: Ashley Armstrong, female   DOB: 07-03-1966, 54 y.o.   MRN: [id number]           Reason for Appointment: Follow-up for Type 1 Diabetes  Referring physician: Dr. [name]   History of Present Illness:          Date of diagnosis of type 1 diabetes mellitus: Age 39       Background history:  She has been on insulin practically since her diagnosis was made.  Initially she had symptoms of weight loss and blurred vision For several years has been on Lantus and NovoLog but usually has had poor control with A1c mostly between 8-10%  Recent history:     INSULIN regimen is:  Lantus 25--35 breakfast and pm.  NovoLog mostly 12-14 units before meals       Current management, blood sugar patterns and problems identified:  Her A1c is back up to 9.8    She was not able to change to a new psychiatrist and is still having excessive stress eating  She cannot sleep at night and will frequently eat snacks or bread at night  Even though she takes NovoLog only some of her carbohydrate intake at night she still has the highest readings overnight  Blood sugars are relatively higher during the day but still frequently high  Overall blood sugars are still about the same as before and still has only 21% of blood sugars within target range  She has monitored more often however  She is not consistently taking extra insulin for high readings but last night she did take 8 units for blood sugar of about 350 and blood sugar came down to about 70.  She is not using any correction factor but arbitrarily taking extra insulin  Also not calculating her mealtime dose on specific carbohydrate counting and mostly takes about the same dose           Meal times are:  Breakfast is 7 AM at Lunch:  PM dinner:3 PM  Typical meal intake: Breakfast is sometimes high fat but otherwise eggs and toast.  Lunch usually a mixed meal and evening meal may be a sandwich with bland food.  Snacks will be cheese  and crackers Usually avoiding drinks with sugar            Exercise: None    Glucose monitoring:  done 4 times a day         Glucometer:  Freestyle libre.       Blood Glucose readings by time of day and averages from download   CONTINUOUS GLUCOSE MONITORING RECORD INTERPRETATION    Dates of Recording: 1/27 through 2/9  Sensor description:Freestyle libre Results statistics:   CGM use % of time  87  Average and SD  230, GV 27  Time in range      21%  % Time Above 180  43  % Time above 250  35  % Time Below target  1    PRE-MEAL Fasting Lunch Dinner Bedtime Overall  Glucose range:       Mean/median:  200   221  255  230   POST-MEAL PC Breakfast PC Lunch PC Dinner  Glucose range:     Mean/median:  223  253  263    Glycemic patterns summary:   Hyperglycemic episodes are occurring daily with fairly consistently high readings in the afternoons and evenings and variably at night HIGHEST blood sugars on an average are from 10  PM-12 AM May also have significant rebound after low normal blood sugars  Hypoglycemic episodes are minimal with low normal blood sugar once at noon and once at 6 AM  Overnight periods: Blood sugars are averaging just over 250 after midnight and gradually decreasing down to an average of 200 Only on 3 nights blood sugars are within the target range  Preprandial periods: Blood sugars are averaging around 200 at breakfast, 225 at lunch and similar at lunch with more variability in the evenings  Postprandial periods:   After breakfast: Blood sugars do not show any consistent rise with gradually trending higher After lunch:   Blood sugars are modestly higher with no consistent pattern After dinner: Blood sugars are quite variable with significant increase only on a couple of occasions late at night     Dietician visit, most recent: 11/18  Weight history:  Wt Readings from Last 3 Encounters:  12/21/19 134 lb 9.6 oz (61.1 kg)  10/26/19 138 lb 6.4  oz (62.8 kg)  09/07/19 133 lb (60.3 kg)    Glycemic control:   Lab Results  Component Value Date   HGBA1C 9.8 (A) 12/21/2019   HGBA1C 9.2 (A) 09/07/2019   HGBA1C 9.8 (A) 06/07/2019   Lab Results  Component Value Date   MICROALBUR 1.7 07/26/2019   LDLCALC 77 07/26/2019   CREATININE 1.12 (H) 07/26/2019   Lab Results  Component Value Date   MICRALBCREAT 12 07/26/2019    No results found for: FRUCTOSAMINE  Office Visit on 12/21/2019  Component Date Value Ref Range Status  . Hemoglobin A1C 12/21/2019 9.8* 4.0 - 5.6 % Final    Allergies as of 12/21/2019   No Known Allergies     Medication List       Accurate as of December 21, 2019  1:09 PM. If you have any questions, ask your nurse or doctor.        busPIRone 10 MG tablet Commonly known as: BUSPAR Take 5 mg by mouth 2 (two) times daily.   calcium-vitamin D 500-200 MG-UNIT Tabs tablet Commonly known as: OSCAL WITH D Take 1 tablet by mouth daily.   Durezol 0.05 % Emul Generic drug: Difluprednate Apply 1 drop to eye daily at 8 pm.   Fish Oil 1000 MG Caps Take 1 capsule by mouth daily.   FreeStyle Libre 2 Reader [facility] 1 each by Does not apply route See admin instructions. Use Freestyle libre 2 reader to monitor blood sugars continuously. What changed: Another medication with the same name was removed. Continue taking this medication, and follow the directions you see here. Changed by: Jayme Cloud, LPN   FreeStyle Libre 2 Sensor Misc 1 each by Does not apply route See admin instructions. Use one sensor once every 14 days to monitor blood sugars. What changed: Another medication with the same name was removed. Continue taking this medication, and follow the directions you see here. Changed by: Jayme Cloud, LPN   FreeStyle Precision Neo System w/Device Kit 1 kit by Does not apply route 2 (two) times daily.   FreeStyle Precision Neo Test test strip Generic drug: glucose blood Check blood sugar twice daily    Lantus SoloStar 100 UNIT/ML Solostar Pen Generic drug: Insulin Glargine Inject 25 units under the skin in the morning and 31 units in the evening.   lisinopril 2.5 MG tablet Commonly known as: ZESTRIL TAKE ONE (1) TABLET BY MOUTH EVERY DAY   metoCLOPramide 5 MG tablet Commonly known as: REGLAN TAKE ONE TO  TWO TABLETS BY MOUTH EVERY SIX HOURS AS NEEDED FOR NAUSEAOR VOMITING   mirtazapine 30 MG tablet Commonly known as: REMERON Take 1 tablet by mouth daily at 8 pm.   montelukast 10 MG tablet Commonly known as: SINGULAIR TAKE ONE TABLET (10MG TOTAL) BY MOUTH ATBEDTIME   NovoLOG FlexPen 100 UNIT/ML FlexPen Generic drug: insulin aspart INJECT 8 TO 12 UNITS SUBCUTANEOUSLY BEFORE EACH MEAL AS DIRECTED. ADJUST PERSLIDING SCALE DEPENDING ON SUGAR READING. MAX DOSE PER DAY IS 51   omeprazole 40 MG capsule Commonly known as: PRILOSEC TAKE ONE CAPSULE BY MOUTH 30 MINUTES PRIOR TO BREAKFAST AND SUPPER   ondansetron 4 MG tablet Commonly known as: ZOFRAN TAKE ONE TABLET BY MOUTH EVERY EIGHT HOURS AS NEEDED FOR NAUSEA OR VOMITING   OneTouch Delica Lancets 44H Misc USE FOUR TIMES A DAY AS DIRECTED.   Pen Needles 30G X 5 MM Misc Inject 1 each into the skin 5 (five) times daily. Use pen needles to inject insulin 5 times daily.   polyethylene glycol 17 g packet Commonly known as: MIRALAX / GLYCOLAX Take 17 g by mouth daily as needed.   pravastatin 80 MG tablet Commonly known as: PRAVACHOL Take 1 tablet (80 mg total) by mouth every evening.   Valium 10 MG tablet Generic drug: diazepam Take one (1) Tablet Once a Day, As Necessary for panic   vitamin B-12 100 MCG tablet Commonly known as: CYANOCOBALAMIN Take 1 tablet by mouth daily.       Allergies: No Known Allergies  Past Medical History:  Diagnosis Date  . Anxiety   . Dyslipidemia   . Gastroparesis due to DM ([place]) OCT 2014   75% AT 2 HRS, GLU >   . H/O eye surgery 01/2018  . Headache(784.0)   . IDDM (insulin dependent  diabetes mellitus)   . Neuropathy   . Nicotine addiction   . TIA (transient ischemic attack)    Dr. Merlene Laughter    Past Surgical History:  Procedure Laterality Date  . Cataract surgery Bilateral 01/20/2017, 02/03/2017  . COLONOSCOPY N/A 05/09/2016   Dr. Oneida Alar: 2 hyperplastic polyps removed.  Next colonoscopy in 2027  . ESOPHAGOGASTRODUODENOSCOPY N/A 08/31/2013   SLF: 1. Earky satiety nausea may be due to Gastroparesis/pyloric channel stenosis. 2. small hiatal hernia 3. Moderate erosive gastritis.   Marland Kitchen EYE SURGERY  01/2018  . laser surgery bilateral eye      Family History  Problem Relation Age of Onset  . Hypertension Mother   . Diabetes Mother   . Hyperlipidemia Mother   . Rosacea Mother   . Hypertension Father   . Diabetes Brother   . Breast cancer Maternal Aunt   . Colon cancer Neg Hx     Social History:  reports that she quit smoking about 8 years ago. Her smoking use included cigarettes. She has a 36.00 pack-year smoking history. She has never used smokeless tobacco. She reports that she does not drink alcohol or use drugs.   Review of Systems   Lipid history: Followed by PCP, currently on pravastatin 80 mg Labs as follows    Lab Results  Component Value Date   CHOL 154 07/26/2019   HDL 59 07/26/2019   LDLCALC 77 07/26/2019   TRIG 94 07/26/2019   CHOLHDL 2.6 07/26/2019           Hypertension: This is well controlled  BP Readings from Last 3 Encounters:  12/21/19 140/80  10/26/19 118/74  09/07/19 118/68    Most recent eye exam  was in 01/2019  Most recent foot exam: 9/19  GASTROPARESIS: her nausea is mostly controlled but may have some bloating at times including in the mornings Followed by gastroenterologist  She is on Reglan before meals  Currently known complications of diabetes: Gastroparesis, neuropathy, retinopathy, no nephropathy  LABS:  Office Visit on 12/21/2019  Component Date Value Ref Range Status  . Hemoglobin A1C 12/21/2019 9.8* 4.0 -  5.6 % Final    Physical Examination:  BP 140/80 (BP Location: Left Arm, Patient Position: Sitting, Cuff Size: Normal)   Pulse 87   Ht 5' 8" (1.727 m)   Wt 134 lb 9.6 oz (61.1 kg)   LMP 07/26/2013   SpO2 98%   BMI 20.47 kg/m        ASSESSMENT:  Diabetes type 1, poorly controlled with multiple complications  Her E4M has only slightly improved at 9.2  She is having significant hyperglycemia throughout the day with average blood sugar over 200 and any given time of the day She has checked her sugars more often However has not increased her insulin enough especially for late night snacks This is causing highest blood sugars after about 10 PM Has only occasional postprandial spikes Clearly needs more basal insulin especially during the day and likely at night also Also discussed how to adjust her NovoLog  She is very reluctant to use an insulin pump despite discussing advantages of the closed-loop system   GASTROPARESIS: On Reglan, mostly controlled with less nausea recently  Anxiety and panic attacks: She will continue to work with her psychiatrist   PLAN:    She needs to use a carbohydrate-based insulin dose for her meals  Also she will use a correction factor of about 3 units per 100 mg rather than arbitrary doses  She will increase her Lantus to at least 28 units in the morning and may need higher doses if afternoon blood sugars are still high  She will go up at least 3 units on her evening Lantus and increase further if morning sugars are still over 130 consistently  Encourage her to start walking or doing indoor exercise regimen to help with blood sugar control, stress  Continue to work with psychiatrist for anxiety and depression    Patient Instructions  Lantus 28-30 units daily in am  Lantus 38 in pm to get am sugar in 90-130 range  Use 3 units Novolog  per 100 mg for high sugars   Walk daily or do indoor aerobics.         Elayne Snare 12/21/2019,  1:09 PM   Note: This office note was prepared with Dragon voice recognition system technology. Any transcriptional errors that result from this process are unintentional.

## 2020-01-07 ENCOUNTER — Other Ambulatory Visit: Payer: Self-pay | Admitting: Nurse Practitioner

## 2020-01-17 DIAGNOSIS — M65351 Trigger finger, right little finger: Secondary | ICD-10-CM | POA: Diagnosis not present

## 2020-02-09 ENCOUNTER — Other Ambulatory Visit: Payer: Self-pay | Admitting: *Deleted

## 2020-02-09 ENCOUNTER — Other Ambulatory Visit: Payer: Self-pay | Admitting: Family Medicine

## 2020-02-09 DIAGNOSIS — E1140 Type 2 diabetes mellitus with diabetic neuropathy, unspecified: Secondary | ICD-10-CM

## 2020-02-09 DIAGNOSIS — I10 Essential (primary) hypertension: Secondary | ICD-10-CM

## 2020-02-09 DIAGNOSIS — E785 Hyperlipidemia, unspecified: Secondary | ICD-10-CM

## 2020-02-09 DIAGNOSIS — Z794 Long term (current) use of insulin: Secondary | ICD-10-CM

## 2020-02-16 ENCOUNTER — Other Ambulatory Visit: Payer: Self-pay

## 2020-02-16 ENCOUNTER — Encounter: Payer: Self-pay | Admitting: Podiatry

## 2020-02-16 ENCOUNTER — Ambulatory Visit: Payer: PPO | Admitting: Podiatry

## 2020-02-16 DIAGNOSIS — M129 Arthropathy, unspecified: Secondary | ICD-10-CM | POA: Insufficient documentation

## 2020-02-16 DIAGNOSIS — B351 Tinea unguium: Secondary | ICD-10-CM

## 2020-02-16 DIAGNOSIS — M79675 Pain in left toe(s): Secondary | ICD-10-CM

## 2020-02-16 DIAGNOSIS — E1142 Type 2 diabetes mellitus with diabetic polyneuropathy: Secondary | ICD-10-CM | POA: Diagnosis not present

## 2020-02-16 DIAGNOSIS — M79674 Pain in right toe(s): Secondary | ICD-10-CM | POA: Diagnosis not present

## 2020-02-16 NOTE — Progress Notes (Signed)
This patient returns to my office for at risk foot care.  This patient requires this care by a professional since this patient will be at risk due to having DM with neuropathy. This patient is unable to cut nails himself since the patient cannot reach his nails.These nails are painful walking and wearing shoes.  This patient presents for at risk foot care today.  General Appearance  Alert, conversant and in no acute stress.  Vascular  Dorsalis pedis and posterior tibial  pulses are palpable  bilaterally.  Capillary return is within normal limits  bilaterally. Temperature is within normal limits  bilaterally.  Neurologic  Senn-Weinstein monofilament wire test absent   bilaterally. Muscle power within normal limits bilaterally.  Nails Thick disfigured discolored nails with subungual debris  from hallux to fifth toes bilaterally. No evidence of bacterial infection or drainage bilaterally.  Orthopedic  No limitations of motion  feet .  No crepitus or effusions noted.  No bony pathology or digital deformities noted.  Skin  normotropic skin with no porokeratosis noted bilaterally.  No signs of infections or ulcers noted.     Onychomycosis  Pain in right toes  Pain in left toes  Consent was obtained for treatment procedures.   Mechanical debridement of nails 1-5  bilaterally performed with a nail nipper.  Filed with dremel without incident.    Return office visit   3 months                   Told patient to return for periodic foot care and evaluation due to potential at risk complications.   Gardiner Barefoot DPM

## 2020-02-23 DIAGNOSIS — F410 Panic disorder [episodic paroxysmal anxiety] without agoraphobia: Secondary | ICD-10-CM | POA: Diagnosis not present

## 2020-03-01 ENCOUNTER — Ambulatory Visit: Payer: PPO | Attending: Internal Medicine

## 2020-03-01 DIAGNOSIS — Z23 Encounter for immunization: Secondary | ICD-10-CM

## 2020-03-01 NOTE — Progress Notes (Signed)
   Covid-19 Vaccination Clinic  Name:  Ashley Armstrong    MRN: [id number] DOB: 1966-03-23  03/01/2020  [name] was observed post Covid-19 immunization for 15 minutes without incident. She was provided with Vaccine Information Sheet and instruction to access the V-Safe system.   [name] was instructed to call 911 with any severe reactions post vaccine: Marland Kitchen Difficulty breathing  . Swelling of face and throat  . A fast heartbeat  . A bad rash all over body  . Dizziness and weakness   Immunizations Administered    Name Date Dose VIS Date Route   Moderna COVID-19 Vaccine 03/01/2020 12:44 PM 0.5 mL 10/2019 Intramuscular   Manufacturer: Moderna   Lot: [id number]   [id number]

## 2020-03-08 ENCOUNTER — Encounter: Payer: Self-pay | Admitting: Gastroenterology

## 2020-03-08 ENCOUNTER — Other Ambulatory Visit: Payer: Self-pay

## 2020-03-08 ENCOUNTER — Ambulatory Visit: Payer: PPO | Admitting: Gastroenterology

## 2020-03-08 VITALS — BP 115/68 | HR 78 | Temp 97.3°F | Ht 68.0 in | Wt 131.2 lb

## 2020-03-08 DIAGNOSIS — K3184 Gastroparesis: Secondary | ICD-10-CM

## 2020-03-08 DIAGNOSIS — K5900 Constipation, unspecified: Secondary | ICD-10-CM

## 2020-03-08 DIAGNOSIS — K219 Gastro-esophageal reflux disease without esophagitis: Secondary | ICD-10-CM

## 2020-03-08 NOTE — Progress Notes (Signed)
    Primary Care Physician: Simpson, Margaret E, MD  Primary Gastroenterologist:  Sandi Fields, MD   Chief Complaint  Patient presents with  . Gastroesophageal Reflux    feels like acid coming back in throat  . Nausea    no vomiting  . constipation/diarrhea    feels stomach may be having hard time emptying her food  . Bloated    HPI: Ashley Armstrong is a 54 y.o. female here for follow-up.  She had a virtual visit back in October 2020.  She has a history of constipation, GERD, gastroparesis, left lower quadrant abdominal pain.Last colonoscopy in 2017, she had 2 hyperplastic polyps removed.  Next colonoscopy due in 10 years. EGD in 2014 with pyloric channel stenosis s/p dilation, gastritis.  CT renal stone study July 2019 unremarkable.  Right upper quadrant ultrasound February 2019 showed 2 mm gallbladder polyp, unchanged from 2016.  No further work-up needed.  Complete abdominal ultrasound February 2018, small gallbladder polyp, otherwise unremarkable.  Gastric emptying study 2014 with 100% retention at 60 minutes, 75% retention at 120 minutes.  This was after pyloric channel stenosis dilated.  Heartburn happens about three times per month. Certain foods seem to do it. Biscuits. Happens after eat. No dysphagia. Took two TUMS and helped. Every three to four days, feels bloated. When lays down food moved down faster. Will use reglan when has symptoms and it helps.  Does not use Reglan every day, and at the most takes 1 to 2/day.  BM every 2-3 days. Will take miralax one cap every other day.  Will have occasional diarrhea.  No melena or rectal bleeding.  No nausea or vomiting.  She takes omeprazole 40 mg twice daily for reflux. No unintentional weight loss.   Going to see diabetic specialist in May, Dr. Kumar.   Current Outpatient Medications  Medication Sig Dispense Refill  . Blood Glucose Monitoring Suppl (FREESTYLE PRECISION NEO SYSTEM) w/Device KIT 1 kit by Does not apply route 2 (two)  times daily. 1 kit 0  . busPIRone (BUSPAR) 10 MG tablet Take 5 mg by mouth 2 (two) times daily.    . calcium-vitamin D (OSCAL WITH D) 500-200 MG-UNIT TABS tablet Take 1 tablet by mouth daily.    . Continuous Blood Gluc Receiver (FREESTYLE LIBRE 2 READER) DEVI 1 each by Does not apply route See admin instructions. Use Freestyle libre 2 reader to monitor blood sugars continuously. 1 each 0  . Continuous Blood Gluc Sensor (FREESTYLE LIBRE 2 SENSOR) MISC 1 each by Does not apply route See admin instructions. Use one sensor once every 14 days to monitor blood sugars. 2 each 2  . diazepam (VALIUM) 10 MG tablet Take one (1) Tablet Once a Day, As Necessary for panic    . DUREZOL 0.05 % EMUL Apply 1 drop to eye daily at 8 pm.    . glucose blood (FREESTYLE PRECISION NEO TEST) test strip Check blood sugar twice daily 100 each 12  . insulin aspart (NOVOLOG FLEXPEN) 100 UNIT/ML FlexPen INJECT 8 TO 12 UNITS SUBCUTANEOUSLY BEFORE EACH MEAL AS DIRECTED. ADJUST PERSLIDING SCALE DEPENDING ON SUGAR READING. MAX DOSE PER DAY IS 51 45 mL 1  . lamoTRIgine (LAMICTAL) 100 MG tablet Take 100 mg by mouth daily.     . LANTUS SOLOSTAR 100 UNIT/ML Solostar Pen INJECT 25 UNITS UNDER THE SKIN IN THE MORNING AND 31 UNITS IN THE EVENING 30 mL 1  . lisinopril (ZESTRIL) 2.5 MG tablet TAKE ONE (1) TABLET BY   MOUTH EVERY DAY 90 tablet 1  . metoCLOPramide (REGLAN) 5 MG tablet TAKE ONE TO TWO TABLETS BY MOUTH EVERY SIX HOURS AS NEEDED FOR NAUSEAOR VOMITING 120 tablet 5  . mirtazapine (REMERON) 30 MG tablet Take 1 tablet by mouth daily at 8 pm.    . montelukast (SINGULAIR) 10 MG tablet TAKE ONE TABLET (10MG TOTAL) BY MOUTH ATBEDTIME 90 tablet 1  . Omega-3 Fatty Acids (FISH OIL) 1000 MG CAPS Take 1 capsule by mouth daily.    . omeprazole (PRILOSEC) 40 MG capsule TAKE ONE CAPSULE BY MOUTH 30 MINUTES PRIOR TO BREAKFAST AND SUPPER 180 capsule 3  . ondansetron (ZOFRAN) 4 MG tablet TAKE ONE TABLET BY MOUTH EVERY EIGHT HOURS AS NEEDED FOR NAUSEA  OR VOMITING 30 tablet 3  . ONETOUCH DELICA LANCETS 33G MISC USE FOUR TIMES A DAY AS DIRECTED. 100 each 1  . polyethylene glycol (MIRALAX / GLYCOLAX) 17 g packet Take 17 g by mouth daily as needed.    . pravastatin (PRAVACHOL) 80 MG tablet TAKE ONE TABLET (80MG TOTAL) BY MOUTH EVERY EVENING 90 tablet 1  . SURE COMFORT PEN NEEDLES 30G X 8 MM MISC     . vitamin B-12 (CYANOCOBALAMIN) 100 MCG tablet Take 1 tablet by mouth daily.     No current facility-administered medications for this visit.    Allergies as of 03/08/2020  . (No Known Allergies)    ROS:  General: Negative for anorexia, weight loss, fever, chills, fatigue, weakness. ENT: Negative for hoarseness, difficulty swallowing , nasal congestion. CV: Negative for chest pain, angina, palpitations, dyspnea on exertion, peripheral edema.  Respiratory: Negative for dyspnea at rest, dyspnea on exertion, cough, sputum, wheezing.  GI: See history of present illness. GU:  Negative for dysuria, hematuria, urinary incontinence, urinary frequency, nocturnal urination.  Endo: Negative for unusual weight change.    Physical Examination:   BP 115/68   Pulse 78   Temp (!) 97.3 F (36.3 C) (Oral)   Ht 5' 8" (1.727 m)   Wt 131 lb 3.2 oz (59.5 kg)   LMP 07/26/2013   BMI 19.95 kg/m   General: Well-nourished, well-developed in no acute distress.  Eyes: No icterus. Mouth: masked Lungs: Clear to auscultation bilaterally.  Heart: Regular rate and rhythm, no murmurs rubs or gallops.  Abdomen: Bowel sounds are normal, nontender, nondistended, no hepatosplenomegaly or masses, no abdominal bruits or hernia , no rebound or guarding.   Extremities: No lower extremity edema. No clubbing or deformities. Neuro: Alert and oriented x 4   Skin: Warm and dry, no jaundice.   Psych: Alert and cooperative, normal mood and affect.  Labs:  Lab Results  Component Value Date   CREATININE 1.12 (H) 07/26/2019   BUN 18 07/26/2019   NA 139 07/26/2019   K 5.1  07/26/2019   CL 107 07/26/2019   CO2 26 07/26/2019   Lab Results  Component Value Date   ALT 12 07/26/2019   AST 14 07/26/2019   ALKPHOS 88 05/18/2018   BILITOT 0.4 07/26/2019   Lab Results  Component Value Date   WBC 7.6 05/18/2018   HGB 12.4 05/18/2018   HCT 38.0 05/18/2018   MCV 88.2 05/18/2018   PLT 253 05/18/2018    Imaging Studies: No results found.  Assessment and plan:  Pleasant 53-year-old female with constipation, gastroparesis, GERD, left-sided abdominal pain presenting for follow-up.  Overall she is stable.  She has breakthrough heartburn occurring about 2-3 times per month.  No alarm symptoms.  She is   on omeprazole 40 mg twice daily.  Gastroparesis likely contributing.  Also complains of abdominal bloating several days per week, occurring postprandially.  She tries not to overeat.  She takes Reglan 1 to 2 pills, at most daily but does not use it every day.  Primarily as needed.  No vomiting.  No weight loss.  Constipation, inadequately controlled at this time.  Using MiraLAX every other day.  At times will have diarrhea but feels like this because she has been backed up for a couple of days.  She will continue omeprazole 40 mg twice daily before meals.  On days that she has breakthrough heartburn and/or postprandial bloating, I have asked that she journal what she ate that day.  Hopefully she will be able to find a few trigger that she can eliminate.  If any of her symptoms worsen or change she will let me know.  For her gastroparesis, continue low-dose Reglan as needed.  Avoid overeating.  Avoid food triggers, high fatty food.   For constipation, we will have her increase her MiraLAX to half a capful every day, titrate up to 1 capful daily to manage her constipation.  Call with any questions or concerns.  Her left-sided abdominal pain appears to be related to her prior function.  Resolves after bowel movement.  Has been evaluated extensively in the past.  Return to  the office in 6 months or call sooner if needed.

## 2020-03-08 NOTE — Progress Notes (Signed)
CC'ED TO PCP 

## 2020-03-08 NOTE — Patient Instructions (Signed)
1. Continue your current medications as before, metoclopramide and omeprazole. 2. When you have heartburn and bloating, write down what you have eaten that day. Hopefully you will be able to find your food trigger so that you can avoid in the future to cut back on symptoms. Call if your symptoms become more frequent or more severe.  3. For constipation, take 1/2 capful of Miralax every day with goal of BM every day to every other day. You can adjust the dose (no more than 2 capfuls daily) as needed to keep bowels regular and avoid diarrhea.  4. Return to the office in six months or call sooner if needed.

## 2020-03-17 ENCOUNTER — Other Ambulatory Visit: Payer: Self-pay

## 2020-03-17 DIAGNOSIS — F4323 Adjustment disorder with mixed anxiety and depressed mood: Secondary | ICD-10-CM | POA: Diagnosis not present

## 2020-03-20 ENCOUNTER — Ambulatory Visit (INDEPENDENT_AMBULATORY_CARE_PROVIDER_SITE_OTHER): Payer: PPO | Admitting: Endocrinology

## 2020-03-20 ENCOUNTER — Other Ambulatory Visit: Payer: Self-pay

## 2020-03-20 ENCOUNTER — Other Ambulatory Visit: Payer: Self-pay | Admitting: Endocrinology

## 2020-03-20 ENCOUNTER — Encounter: Payer: Self-pay | Admitting: Endocrinology

## 2020-03-20 ENCOUNTER — Telehealth (INDEPENDENT_AMBULATORY_CARE_PROVIDER_SITE_OTHER): Payer: PPO

## 2020-03-20 VITALS — BP 134/78 | Ht 68.0 in | Wt 133.0 lb

## 2020-03-20 VITALS — BP 134/78 | HR 78 | Temp 97.9°F | Wt 133.8 lb

## 2020-03-20 DIAGNOSIS — Z794 Long term (current) use of insulin: Secondary | ICD-10-CM | POA: Diagnosis not present

## 2020-03-20 DIAGNOSIS — Z0000 Encounter for general adult medical examination without abnormal findings: Secondary | ICD-10-CM | POA: Diagnosis not present

## 2020-03-20 DIAGNOSIS — E1140 Type 2 diabetes mellitus with diabetic neuropathy, unspecified: Secondary | ICD-10-CM | POA: Diagnosis not present

## 2020-03-20 DIAGNOSIS — E1043 Type 1 diabetes mellitus with diabetic autonomic (poly)neuropathy: Secondary | ICD-10-CM

## 2020-03-20 DIAGNOSIS — K3184 Gastroparesis: Secondary | ICD-10-CM

## 2020-03-20 LAB — POCT GLYCOSYLATED HEMOGLOBIN (HGB A1C): Hemoglobin A1C: 9.6 % — AB (ref 4.0–5.6)

## 2020-03-20 MED ORDER — PRAVASTATIN SODIUM 80 MG PO TABS: ORAL_TABLET | ORAL | 1 refills | Status: DC

## 2020-03-20 MED ORDER — MONTELUKAST SODIUM 10 MG PO TABS: ORAL_TABLET | ORAL | 1 refills | Status: DC

## 2020-03-20 MED ORDER — SURE COMFORT PEN NEEDLES 30G X 8 MM MISC: 3 refills | Status: DC

## 2020-03-20 MED ORDER — LISINOPRIL 2.5 MG PO TABS: ORAL_TABLET | ORAL | 1 refills | Status: DC

## 2020-03-20 NOTE — Patient Instructions (Signed)
12-16 Novolog at supper  Take extra 2-4 for sugars over 180  36 U Lantus in am and 32 units in pm  3 Reglan daily

## 2020-03-20 NOTE — Progress Notes (Signed)
 Subjective:   Ashley Armstrong is a 53 y.o. female who presents for Medicare Annual (Subsequent) preventive examination.  Review of Systems:   Cardiac Risk Factors include: diabetes mellitus;dyslipidemia;hypertension;smoking/ tobacco exposure     Objective:     Vitals: BP 134/78   Ht 5' 8" (1.727 m)   Wt 133 lb (60.3 kg)   LMP 07/26/2013   BMI 20.22 kg/m   Body mass index is 20.22 kg/m.  Advanced Directives 05/18/2018 03/11/2018 02/12/2017 11/05/2016 05/09/2016 07/27/2015 04/08/2015  Does Patient Have a Medical Advance Directive? No No No No No No No  Would patient like information on creating a medical advance directive? - No - Patient declined Yes (MAU/Ambulatory/Procedural Areas - Information given) No - Patient declined No - patient declined information - No - patient declined information  Pre-existing out of facility DNR order (yellow form or pink MOST form) - - - - - - -    Tobacco Social History   Tobacco Use  Smoking Status Former Smoker  . Packs/day: 1.50  . Years: 24.00  . Pack years: 36.00  . Types: Cigarettes  . Quit date: 08/24/2011  . Years since quitting: 8.5  Smokeless Tobacco Never Used     Counseling given: Not Answered   Clinical Intake:  Pre-visit preparation completed: No  Pain : No/denies pain     Nutritional Status: BMI of 19-24  Normal Diabetes: Yes CBG done?: No Did pt. bring in CBG monitor from home?: No  How often do you need to have someone help you when you read instructions, pamphlets, or other written materials from your doctor or pharmacy?: 1 - Never What is the last grade level you completed in school?: 9th grade  Interpreter Needed?: No  Information entered by :: brandi hudy LPN  Past Medical History:  Diagnosis Date  . Anxiety   . Dyslipidemia   . Gastroparesis due to DM (HCC) OCT 2014   75% AT 2 HRS, GLU >   . H/O eye surgery 01/2018  . Headache(784.0)   . IDDM (insulin dependent diabetes mellitus)   . Neuropathy   .  Nicotine addiction   . TIA (transient ischemic attack)    Dr. [place]   Past Surgical History:  Procedure Laterality Date  . Cataract surgery Bilateral 01/20/2017, 02/03/2017  . COLONOSCOPY N/A 05/09/2016   Dr. Fields: 2 hyperplastic polyps removed.  Next colonoscopy in 2027  . ESOPHAGOGASTRODUODENOSCOPY N/A 08/31/2013   SLF: 1. Earky satiety nausea may be due to Gastroparesis/pyloric channel stenosis. 2. small hiatal hernia 3. Moderate erosive gastritis.   . EYE SURGERY  01/2018  . laser surgery bilateral eye     Family History  Problem Relation Age of Onset  . Hypertension Mother   . Diabetes Mother   . Hyperlipidemia Mother   . Rosacea Mother   . Hypertension Father   . Diabetes Brother   . Breast cancer Maternal Aunt   . Colon cancer Neg Hx    Social History   Socioeconomic History  . Marital status: Single    Spouse name: Not on file  . Number of children: Not on file  . Years of education: Not on file  . Highest education level: Not on file  Occupational History  . Occupation: disabled  Tobacco Use  . Smoking status: Former Smoker    Packs/day: 1.50    Years: 24.00    Pack years: 36.00    Types: Cigarettes    Quit date: 08/24/2011    Years   since quitting: 8.5  . Smokeless tobacco: Never Used  Substance and Sexual Activity  . Alcohol use: No  . Drug use: No  . Sexual activity: Not Currently    Birth control/protection: None  Other Topics Concern  . Not on file  Social History Narrative   Disabled, lives with a room mate   Social Determinants of Health   Financial Resource Strain: Medium Risk  . Difficulty of Paying Living Expenses: Somewhat hard  Food Insecurity: No Food Insecurity  . Worried About Charity fundraiser in the Last Year: Never true  . Ran Out of Food in the Last Year: Never true  Transportation Needs: No Transportation Needs  . Lack of Transportation (Medical): No  . Lack of Transportation (Non-Medical): No  Physical Activity:  Insufficiently Active  . Days of Exercise per Week: 3 days  . Minutes of Exercise per Session: 20 min  Stress: Stress Concern Present  . Feeling of Stress : Rather much  Social Connections: Somewhat Isolated  . Frequency of Communication with Friends and Family: More than three times a week  . Frequency of Social Gatherings with Friends and Family: More than three times a week  . Attends Religious Services: More than 4 times per year  . Active Member of Clubs or Organizations: No  . Attends Archivist Meetings: Never  . Marital Status: Never married    Outpatient Encounter Medications as of 03/20/2020  Medication Sig  . Blood Glucose Monitoring Suppl (FREESTYLE PRECISION NEO SYSTEM) w/Device KIT 1 kit by Does not apply route 2 (two) times daily.  . calcium-vitamin D (OSCAL WITH D) 500-200 MG-UNIT TABS tablet Take 1 tablet by mouth daily.  . Continuous Blood Gluc Receiver (FREESTYLE LIBRE 2 READER) DEVI 1 each by Does not apply route See admin instructions. Use Freestyle libre 2 reader to monitor blood sugars continuously.  . Continuous Blood Gluc Sensor (FREESTYLE LIBRE 2 SENSOR) MISC 1 each by Does not apply route See admin instructions. Use one sensor once every 14 days to monitor blood sugars.  . diazepam (VALIUM) 10 MG tablet Take one (1) Tablet Once a Day, As Necessary for panic  . DUREZOL 0.05 % EMUL Apply 1 drop to eye daily at 8 pm.  . glucose blood (FREESTYLE PRECISION NEO TEST) test strip Check blood sugar twice daily  . insulin aspart (NOVOLOG FLEXPEN) 100 UNIT/ML FlexPen INJECT 8 TO 12 UNITS SUBCUTANEOUSLY BEFORE EACH MEAL AS DIRECTED. ADJUST PERSLIDING SCALE DEPENDING ON SUGAR READING. MAX DOSE PER DAY IS 51  . lamoTRIgine (LAMICTAL) 100 MG tablet Take 100 mg by mouth daily.   Marland Kitchen LANTUS SOLOSTAR 100 UNIT/ML Solostar Pen INJECT 25 UNITS UNDER THE SKIN IN THE MORNING AND 31 UNITS IN THE EVENING  . lisinopril (ZESTRIL) 2.5 MG tablet TAKE ONE (1) TABLET BY MOUTH EVERY DAY    . metoCLOPramide (REGLAN) 5 MG tablet TAKE ONE TO TWO TABLETS BY MOUTH EVERY SIX HOURS AS NEEDED FOR NAUSEAOR VOMITING  . mirtazapine (REMERON) 30 MG tablet Take 1 tablet by mouth daily at 8 pm.  . montelukast (SINGULAIR) 10 MG tablet TAKE ONE TABLET (10MG TOTAL) BY MOUTH ATBEDTIME  . Omega-3 Fatty Acids (FISH OIL) 1000 MG CAPS Take 1 capsule by mouth daily.  Marland Kitchen omeprazole (PRILOSEC) 40 MG capsule TAKE ONE CAPSULE BY MOUTH 30 MINUTES PRIOR TO BREAKFAST AND SUPPER  . ondansetron (ZOFRAN) 4 MG tablet TAKE ONE TABLET BY MOUTH EVERY EIGHT HOURS AS NEEDED FOR NAUSEA OR VOMITING  . ONETOUCH  DELICA LANCETS 33G MISC USE FOUR TIMES A DAY AS DIRECTED.  . polyethylene glycol (MIRALAX / GLYCOLAX) 17 g packet Take 17 g by mouth daily as needed.  . pravastatin (PRAVACHOL) 80 MG tablet TAKE ONE TABLET (80MG TOTAL) BY MOUTH EVERY EVENING  . vitamin B-12 (CYANOCOBALAMIN) 100 MCG tablet Take 1 tablet by mouth daily.  . [DISCONTINUED] busPIRone (BUSPAR) 10 MG tablet Take 5 mg by mouth 2 (two) times daily.  . [DISCONTINUED] SURE COMFORT PEN NEEDLES 30G X 8 MM MISC    No facility-administered encounter medications on file as of 03/20/2020.    Activities of Daily Living In your present state of health, do you have any difficulty performing the following activities: 03/20/2020 03/20/2020  Hearing? N N  Vision? N N  Difficulty concentrating or making decisions? N N  Walking or climbing stairs? N N  Dressing or bathing? N N  Doing errands, shopping? N N  Preparing Food and eating ? N -  Using the Toilet? N -  In the past six months, have you accidently leaked urine? N -  Do you have problems with loss of bowel control? N -  Managing your Medications? N -  Managing your Finances? N -  Housekeeping or managing your Housekeeping? N -  Some recent data might be hidden    Patient Care Team: Simpson, Margaret E, MD as PCP - General Fields, Sandi L, MD as Consulting Physician (Gastroenterology) Sanders, Jason, MD  as Consulting Physician (Ophthalmology) Bevis, Timothy, MD as Consulting Physician (Ophthalmology) Koneswaran, Suresh A, MD as Attending Physician (Cardiology) Stover, Titorya, DPM as Consulting Physician (Podiatry)    Assessment:   This is a routine wellness examination for Ashley Armstrong.  Exercise Activities and Dietary recommendations Current Exercise Habits: Home exercise routine, Type of exercise: walking, Time (Minutes): 20, Frequency (Times/Week): 3, Weekly Exercise (Minutes/Week): 60, Intensity: Mild, Exercise limited by: orthopedic condition(s)  Goals    . DIET - INCREASE WATER INTAKE    . Exercise 3x per week (30 min per time)     Recommend starting a routine exercise program at least 3 days a week for 30-45 minutes at a time as tolerated.         Fall Risk Fall Risk  03/20/2020 10/26/2019 03/18/2019 10/05/2018 06/02/2018  Falls in the past year? 0 0 0 0 No  Number falls in past yr: 0 0 - 0 -  Injury with Fall? 0 0 0 0 -   Is the patient's home free of loose throw rugs in walkways, pet beds, electrical cords, etc?   yes      Grab bars in the bathroom? yes      Handrails on the stairs?   yes      Adequate lighting?   yes  Timed Get Up and Go performed: not performed, telephone visit   Depression Screen PHQ 2/9 Scores 03/20/2020 03/18/2019 10/05/2018 06/02/2018  PHQ - 2 Score 4 2 2 4  PHQ- 9 Score 12 15 18 18     Cognitive Function     6CIT Screen 03/20/2020 03/18/2019 03/11/2018 02/12/2017  What Year? 0 points 0 points 0 points 0 points  What month? 0 points 0 points 0 points 0 points  What time? 0 points 0 points 0 points 0 points  Count back from 20 0 points 0 points 0 points 0 points  Months in reverse 0 points 0 points 0 points 0 points  Repeat phrase 0 points 0 points 0 points 0 points  Total   Score 0 0 0 0    Immunization History  Administered Date(s) Administered  . H1N1 10/24/2008  . Influenza Whole 09/24/2007, 08/09/2010  . Influenza,inj,Quad PF,6+ Mos 07/29/2013,  10/18/2014, 08/29/2015, 09/23/2016, 07/16/2017, 10/05/2018, 07/26/2019  . Influenza-Unspecified 08/08/2017, 10/07/2018  . Moderna SARS-COVID-2 Vaccination 03/01/2020  . Pneumococcal Polysaccharide-23 03/19/2013, 06/02/2018  . Td 04/11/2004  . Tdap 06/16/2014    Qualifies for Shingles Vaccine? qualifies  Screening Tests Health Maintenance  Topic Date Due  . OPHTHALMOLOGY EXAM  01/02/2019  . COVID-19 Vaccine (2 - Moderna 2-dose series) 03/29/2020  . INFLUENZA VACCINE  06/11/2020  . HEMOGLOBIN A1C  09/20/2020  . FOOT EXAM  10/26/2020  . PAP SMEAR-Modifier  06/02/2021  . MAMMOGRAM  10/13/2021  . TETANUS/TDAP  06/16/2024  . COLONOSCOPY  05/09/2026  . PNEUMOCOCCAL POLYSACCHARIDE VACCINE AGE 2-64 HIGH RISK  Completed  . HIV Screening  Completed    Cancer Screenings: Lung: Low Dose CT Chest recommended if Age 55-80 years, 30 pack-year currently smoking OR have quit w/in 15years. Patient does not qualify. Breast:  Up to date on Mammogram? Yes   Up to date of Bone Density/Dexa? Yes Colorectal: up to date   Additional Screenings: Hepatitis C Screening: one time screen     Plan:     I have personally reviewed and noted the following in the patient's chart:   . Medical and social history . Use of alcohol, tobacco or illicit drugs  . Current medications and supplements . Functional ability and status . Nutritional status . Physical activity . Advanced directives . List of other physicians . Hospitalizations, surgeries, and ER visits in previous 12 months . Vitals . Screenings to include cognitive, depression, and falls . Referrals and appointments  In addition, I have reviewed and discussed with patient certain preventive protocols, quality metrics, and best practice recommendations. A written personalized care plan for preventive services as well as general preventive health recommendations were provided to patient.     Brandi Hudy, LPN, LPN  03/20/2020     

## 2020-03-20 NOTE — Progress Notes (Signed)
Patient ID: Ashley Armstrong, female   DOB: 1966-09-26, 54 y.o.   MRN: [id number]           Reason for Appointment: Follow-up for Type 1 Diabetes  Referring physician: Dr. [name]   History of Present Illness:          Date of diagnosis of type 1 diabetes mellitus: Age 20       Background history:  She has been on insulin practically since her diagnosis was made.  Initially she had symptoms of weight loss and blurred vision For several years has been on Lantus and NovoLog but usually has had poor control with A1c mostly between 8-10%  Recent history:     INSULIN regimen is:  Lantus 31--35 breakfast and pm.  NovoLog mostly 12-14 units before meals       Current management, blood sugar patterns and problems identified:  Her A1c is back up to 9.8    She is still having excessive stress eating especially in the evenings  Blood sugars are on an average very similar to her last visit  This is despite increasing her morning Lantus  She is afraid to take enough insulin in the evenings because of fear of low sugars  Again does not pay attention to her carbohydrates at mealtimes and snacks  She thinks she is starting to do a little walking  Weight is about the same  Blood sugars are high throughout the day except occasionally low normal early morning  As before she is reluctant to consider insulin pumps           Meal times are:  Breakfast is 7 AM at Lunch:  PM dinner:3 PM  Typical meal intake: Breakfast is sometimes high fat but otherwise eggs and toast.  Lunch usually a mixed meal and evening meal may be a sandwich with bland food.  Snacks will be cheese and crackers Usually avoiding drinks with sugar            Exercise: None    Glucose monitoring:  done 4 times a day         Glucometer:  Freestyle libre.       Blood Glucose readings by time of day and averages from download   CONTINUOUS GLUCOSE MONITORING RECORD INTERPRETATION    Dates of Recording: Last  2 weeks  Sensor description: Elenor Legato version 2  Results statistics:   CGM use % of time  96  Average and SD  233, GV 29  Time in range      21%, was 21  % Time Above 180  39  % Time above 250  40  % Time Below target  0    PRE-MEAL Fasting Lunch Dinner Bedtime Overall  Glucose range:       Mean/median:  228  252  220  1/67  233   POST-MEAL PC Breakfast PC Lunch PC Dinner  Glucose range:     Mean/median:    254    Glycemic patterns summary: Blood sugars are above the target range on an average throughout the day and average blood sugar is also over 200 consistently except 4-6 AM Highest blood sugars are before noon and before midnight Recently blood sugars are generally showing the highest readings after dinner Lowest readings are early morning before 6 AM without hypoglycemia  Hyperglycemic episodes are occurring after meals with some variability and occasionally overnight  Hypoglycemic episodes have not been documented  Overnight periods: Blood sugars are usually  high around 250 at midnight and then gradually decrease until about 5-6 AM but still averaging over 180  Preprandial periods: Blood sugars are the lowest before breakfast on an average but well over 200 at lunch and dinner  Postprandial periods:   After breakfast:   Blood sugars are usually rising most of the time and on an average going higher about 50 points After lunch: Blood sugars are variably high, not clear what her lunchtime meal timing is After dinner: Glucose is generally rising to a variable extent after eating although usually high before the meal also   Previous data:   CGM use % of time  87  Average and SD  230, GV 27  Time in range      21%  % Time Above 180  43  % Time above 250  35  % Time Below target  1    PRE-MEAL Fasting Lunch Dinner Bedtime Overall  Glucose range:       Mean/median:  200   221  255  230   POST-MEAL PC Breakfast PC Lunch PC Dinner  Glucose range:     Mean/median:   409  811  914     Dietician visit, most recent: 11/18  Weight history:  Wt Readings from Last 3 Encounters:  03/20/20 133 lb 12.8 oz (60.7 kg)  03/08/20 131 lb 3.2 oz (59.5 kg)  12/21/19 134 lb 9.6 oz (61.1 kg)    Glycemic control:   Lab Results  Component Value Date   HGBA1C 9.6 (A) 03/20/2020   HGBA1C 9.8 (A) 12/21/2019   HGBA1C 9.2 (A) 09/07/2019   Lab Results  Component Value Date   MICROALBUR 1.7 07/26/2019   LDLCALC 77 07/26/2019   CREATININE 1.12 (H) 07/26/2019   Lab Results  Component Value Date   MICRALBCREAT 12 07/26/2019    No results found for: FRUCTOSAMINE  Office Visit on 03/20/2020  Component Date Value Ref Range Status  . Hemoglobin A1C 03/20/2020 9.6* 4.0 - 5.6 % Final    Allergies as of 03/20/2020   No Known Allergies     Medication List       Accurate as of Mar 20, 2020 10:05 AM. If you have any questions, ask your nurse or doctor.        STOP taking these medications   busPIRone 10 MG tablet Commonly known as: BUSPAR Stopped by: Elayne Snare, MD     TAKE these medications   calcium-vitamin D 500-200 MG-UNIT Tabs tablet Commonly known as: OSCAL WITH D Take 1 tablet by mouth daily.   Durezol 0.05 % Emul Generic drug: Difluprednate Apply 1 drop to eye daily at 8 pm.   Fish Oil 1000 MG Caps Take 1 capsule by mouth daily.   FreeStyle Libre 2 Reader [place] 1 each by Does not apply route See admin instructions. Use Freestyle libre 2 reader to monitor blood sugars continuously.   FreeStyle Libre 2 Sensor Misc 1 each by Does not apply route See admin instructions. Use one sensor once every 14 days to monitor blood sugars.   FreeStyle Precision Neo System w/Device Kit 1 kit by Does not apply route 2 (two) times daily.   FreeStyle Precision Neo Test test strip Generic drug: glucose blood Check blood sugar twice daily   lamoTRIgine 100 MG tablet Commonly known as: LAMICTAL Take 100 mg by mouth daily.   Lantus SoloStar 100  UNIT/ML Solostar Pen Generic drug: insulin glargine INJECT 25 UNITS UNDER THE SKIN IN THE  MORNING AND 31 UNITS IN THE EVENING   lisinopril 2.5 MG tablet Commonly known as: ZESTRIL TAKE ONE (1) TABLET BY MOUTH EVERY DAY   metoCLOPramide 5 MG tablet Commonly known as: REGLAN TAKE ONE TO TWO TABLETS BY MOUTH EVERY SIX HOURS AS NEEDED FOR NAUSEAOR VOMITING   mirtazapine 30 MG tablet Commonly known as: REMERON Take 1 tablet by mouth daily at 8 pm.   montelukast 10 MG tablet Commonly known as: SINGULAIR TAKE ONE TABLET (10MG TOTAL) BY MOUTH ATBEDTIME   NovoLOG FlexPen 100 UNIT/ML FlexPen Generic drug: insulin aspart INJECT 8 TO 12 UNITS SUBCUTANEOUSLY BEFORE EACH MEAL AS DIRECTED. ADJUST PERSLIDING SCALE DEPENDING ON SUGAR READING. MAX DOSE PER DAY IS 51   omeprazole 40 MG capsule Commonly known as: PRILOSEC TAKE ONE CAPSULE BY MOUTH 30 MINUTES PRIOR TO BREAKFAST AND SUPPER   ondansetron 4 MG tablet Commonly known as: ZOFRAN TAKE ONE TABLET BY MOUTH EVERY EIGHT HOURS AS NEEDED FOR NAUSEA OR VOMITING   OneTouch Delica Lancets 47W Misc USE FOUR TIMES A DAY AS DIRECTED.   polyethylene glycol 17 g packet Commonly known as: MIRALAX / GLYCOLAX Take 17 g by mouth daily as needed.   pravastatin 80 MG tablet Commonly known as: PRAVACHOL TAKE ONE TABLET (80MG TOTAL) BY MOUTH EVERY EVENING   Sure Comfort Pen Needles 30G X 8 MM Misc Generic drug: Insulin Pen Needle 4 boxes (88day supply) What changed: additional instructions Changed by: Elayne Snare, MD   Valium 10 MG tablet Generic drug: diazepam Take one (1) Tablet Once a Day, As Necessary for panic   vitamin B-12 100 MCG tablet Commonly known as: CYANOCOBALAMIN Take 1 tablet by mouth daily.       Allergies: No Known Allergies  Past Medical History:  Diagnosis Date  . Anxiety   . Dyslipidemia   . Gastroparesis due to DM ([place]) OCT 2014   75% AT 2 HRS, GLU >   . H/O eye surgery 01/2018  . Headache(784.0)   . IDDM  (insulin dependent diabetes mellitus)   . Neuropathy   . Nicotine addiction   . TIA (transient ischemic attack)    Dr. Merlene Laughter    Past Surgical History:  Procedure Laterality Date  . Cataract surgery Bilateral 01/20/2017, 02/03/2017  . COLONOSCOPY N/A 05/09/2016   Dr. Oneida Alar: 2 hyperplastic polyps removed.  Next colonoscopy in 2027  . ESOPHAGOGASTRODUODENOSCOPY N/A 08/31/2013   SLF: 1. Earky satiety nausea may be due to Gastroparesis/pyloric channel stenosis. 2. small hiatal hernia 3. Moderate erosive gastritis.   Marland Kitchen EYE SURGERY  01/2018  . laser surgery bilateral eye      Family History  Problem Relation Age of Onset  . Hypertension Mother   . Diabetes Mother   . Hyperlipidemia Mother   . Rosacea Mother   . Hypertension Father   . Diabetes Brother   . Breast cancer Maternal Aunt   . Colon cancer Neg Hx     Social History:  reports that she quit smoking about 8 years ago. Her smoking use included cigarettes. She has a 36.00 pack-year smoking history. She has never used smokeless tobacco. She reports that she does not drink alcohol or use drugs.   Review of Systems   Lipid history: Followed by PCP, currently on pravastatin 80 mg Labs as follows    Lab Results  Component Value Date   CHOL 154 07/26/2019   HDL 59 07/26/2019   LDLCALC 77 07/26/2019   TRIG 94 07/26/2019   CHOLHDL 2.6 07/26/2019  Hypertension: This is well controlled  BP Readings from Last 3 Encounters:  03/20/20 134/78  03/08/20 115/68  12/21/19 140/80    Most recent eye exam was in 01/2019  Most recent foot exam: 12/20  GASTROPARESIS: her nausea is variably controlled and will take Zofran as needed also Followed by gastroenterologist  She is on Reglan before meals but the gastroenterologist told her to not take more than 2 tablets a day  Currently known complications of diabetes: Gastroparesis, neuropathy, retinopathy, no nephropathy  LABS:  Office Visit on 03/20/2020  Component  Date Value Ref Range Status  . Hemoglobin A1C 03/20/2020 9.6* 4.0 - 5.6 % Final    Physical Examination:  BP 134/78 (BP Location: Left Arm, Patient Position: Sitting, Cuff Size: Normal)   Pulse 78   Temp 97.9 F (36.6 C) (Oral)   Wt 133 lb 12.8 oz (60.7 kg)   LMP 07/26/2013   SpO2 98%   BMI 20.34 kg/m        ASSESSMENT:  Diabetes type 1, poorly controlled with multiple complications  Her I4P has continued to be over 9%  Again she has an adequate insulin especially to cover her meals and snacks Although occasionally blood sugars are coming down to normal overnight she is tends to have persistently high readings throughout the day Is not taking enough insulin to cover her meals and snacks especially in the evenings Also do not think she is taking any correction doses for high readings at lunch and supper when blood sugars are high To some extent blood sugar may be variable because of her gastroparesis   she is however taking a lot of carbohydrates in her diet like sandwiches and cereal Discussed that even though she is trying to do some walking this may not help unless blood sugars are better controlled, currently blood sugars are averaging well over 200 throughout the day She is very reluctant to use an insulin pump despite obvious benefits   GASTROPARESIS: On Reglan, now taking only 5 mg twice daily She did not get symptoms in the controlled with this dose and was told to eat small meals which is difficult for her    PLAN:    She will need to take at least 12 to 16 units of NovoLog at suppertime and 2 to 4 units more when blood sugars are over 180  She will need to take larger doses of Lantus in the morning again and go up to 36.  As a precaution reduce evening dose to 32 units as blood sugars may drop overnight  She will also need to cover his snacks with extra insulin  Make sure she has some protein at breakfast daily  Likely needs higher dose of NovoLog to cover  her morning meal and not skip any doses  She can take 3 to 4 tablets of Reglan as this has been tolerated before and do not see any advantage to restricting the dose  She will also try to cut back on her portions and work with her psychiatrist to control her anxiety and depression better      Patient Instructions  12-16 Novolog at supper  Take extra 2-4 for sugars over 180  36 U Lantus in am and 32 units in pm  3 Reglan daily        Elayne Snare 03/20/2020, 10:05 AM   Note: This office note was prepared with Dragon voice recognition system technology. Any transcriptional errors that result from this process are unintentional.

## 2020-03-20 NOTE — Patient Instructions (Signed)
[name] , Thank you for taking time to come for your Medicare Wellness Visit. I appreciate your ongoing commitment to your health goals. Please review the following plan we discussed and let me know if I can assist you in the future.   Screening recommendations/referrals: Colonoscopy: up to date Mammogram: up to date  Bone Density: n/a Recommended yearly ophthalmology/optometry visit for glaucoma screening and checkup Recommended yearly dental visit for hygiene and checkup  Vaccinations: Influenza vaccine: up to date  Pneumococcal vaccine: up to date  Tdap vaccine: up to date  Shingles vaccine: qualifies  (can get at pharmacy)   Preventive Care 40-64 Years, Female Preventive care refers to lifestyle choices and visits with your health care provider that can promote health and wellness. What does preventive care include?  A yearly physical exam. This is also called an annual well check.  Dental exams once or twice a year.  Routine eye exams. Ask your health care provider how often you should have your eyes checked.  Personal lifestyle choices, including:  Daily care of your teeth and gums.  Regular physical activity.  Eating a healthy diet.  Avoiding tobacco and drug use.  Limiting alcohol use.  Practicing safe sex.  Taking low-dose aspirin daily starting at age 82.  Taking vitamin and mineral supplements as recommended by your health care provider. What happens during an annual well check? The services and screenings done by your health care provider during your annual well check will depend on your age, overall health, lifestyle risk factors, and family history of disease. Counseling  Your health care provider may ask you questions about your:  Alcohol use.  Tobacco use.  Drug use.  Emotional well-being.  Home and relationship well-being.  Sexual activity.  Eating habits.  Work and work Statistician.  Method of birth control.  Menstrual  cycle.  Pregnancy history. Screening  You may have the following tests or measurements:  Height, weight, and BMI.  Blood pressure.  Lipid and cholesterol levels. These may be checked every 5 years, or more frequently if you are over 56 years old.  Skin check.  Lung cancer screening. You may have this screening every year starting at age 20 if you have a 30-pack-year history of smoking and currently smoke or have quit within the past 15 years.  Fecal occult blood test (FOBT) of the stool. You may have this test every year starting at age 40.  Flexible sigmoidoscopy or colonoscopy. You may have a sigmoidoscopy every 5 years or a colonoscopy every 10 years starting at age 89.  Hepatitis C blood test.  Hepatitis B blood test.  Sexually transmitted disease (STD) testing.  Diabetes screening. This is done by checking your blood sugar (glucose) after you have not eaten for a while (fasting). You may have this done every 1-3 years.  Mammogram. This may be done every 1-2 years. Talk to your health care provider about when you should start having regular mammograms. This may depend on whether you have a family history of breast cancer.  BRCA-related cancer screening. This may be done if you have a family history of breast, ovarian, tubal, or peritoneal cancers.  Pelvic exam and Pap test. This may be done every 3 years starting at age 33. Starting at age 92, this may be done every 5 years if you have a Pap test in combination with an HPV test.  Bone density scan. This is done to screen for osteoporosis. You may have this scan if you  are at high risk for osteoporosis. Discuss your test results, treatment options, and if necessary, the need for more tests with your health care provider. Vaccines  Your health care provider may recommend certain vaccines, such as:  Influenza vaccine. This is recommended every year.  Tetanus, diphtheria, and acellular pertussis (Tdap, Td) vaccine. You may  need a Td booster every 10 years.  Zoster vaccine. You may need this after age 31.  Pneumococcal 13-valent conjugate (PCV13) vaccine. You may need this if you have certain conditions and were not previously vaccinated.  Pneumococcal polysaccharide (PPSV23) vaccine. You may need one or two doses if you smoke cigarettes or if you have certain conditions. Talk to your health care provider about which screenings and vaccines you need and how often you need them. This information is not intended to replace advice given to you by your health care provider. Make sure you discuss any questions you have with your health care provider. Document Released: 11/24/2015 Document Revised: 07/17/2016 Document Reviewed: 08/29/2015 Elsevier Interactive Patient Education  2017 [place] Prevention in the Home Falls can cause injuries. They can happen to people of all ages. There are many things you can do to make your home safe and to help prevent falls. What can I do on the outside of my home?  Regularly fix the edges of walkways and driveways and fix any cracks.  Remove anything that might make you trip as you walk through a door, such as a raised step or threshold.  Trim any bushes or trees on the path to your home.  Use bright outdoor lighting.  Clear any walking paths of anything that might make someone trip, such as rocks or tools.  Regularly check to see if handrails are loose or broken. Make sure that both sides of any steps have handrails.  Any raised decks and porches should have guardrails on the edges.  Have any leaves, snow, or ice cleared regularly.  Use sand or salt on walking paths during winter.  Clean up any spills in your garage right away. This includes oil or grease spills. What can I do in the bathroom?  Use night lights.  Install grab bars by the toilet and in the tub and shower. Do not use towel bars as grab bars.  Use non-skid mats or decals in the tub or  shower.  If you need to sit down in the shower, use a plastic, non-slip stool.  Keep the floor dry. Clean up any water that spills on the floor as soon as it happens.  Remove soap buildup in the tub or shower regularly.  Attach bath mats securely with double-sided non-slip rug tape.  Do not have throw rugs and other things on the floor that can make you trip. What can I do in the bedroom?  Use night lights.  Make sure that you have a light by your bed that is easy to reach.  Do not use any sheets or blankets that are too big for your bed. They should not hang down onto the floor.  Have a firm chair that has side arms. You can use this for support while you get dressed.  Do not have throw rugs and other things on the floor that can make you trip. What can I do in the kitchen?  Clean up any spills right away.  Avoid walking on wet floors.  Keep items that you use a lot in easy-to-reach places.  If you need  to reach something above you, use a strong step stool that has a grab bar.  Keep electrical cords out of the way.  Do not use floor polish or wax that makes floors slippery. If you must use wax, use non-skid floor wax.  Do not have throw rugs and other things on the floor that can make you trip. What can I do with my stairs?  Do not leave any items on the stairs.  Make sure that there are handrails on both sides of the stairs and use them. Fix handrails that are broken or loose. Make sure that handrails are as long as the stairways.  Check any carpeting to make sure that it is firmly attached to the stairs. Fix any carpet that is loose or worn.  Avoid having throw rugs at the top or bottom of the stairs. If you do have throw rugs, attach them to the floor with carpet tape.  Make sure that you have a light switch at the top of the stairs and the bottom of the stairs. If you do not have them, ask someone to add them for you. What else can I do to help prevent  falls?  Wear shoes that:  Do not have high heels.  Have rubber bottoms.  Are comfortable and fit you well.  Are closed at the toe. Do not wear sandals.  If you use a stepladder:  Make sure that it is fully opened. Do not climb a closed stepladder.  Make sure that both sides of the stepladder are locked into place.  Ask someone to hold it for you, if possible.  Clearly mark and make sure that you can see:  Any grab bars or handrails.  First and last steps.  Where the edge of each step is.  Use tools that help you move around (mobility aids) if they are needed. These include:  Canes.  Walkers.  Scooters.  Crutches.  Turn on the lights when you go into a dark area. Replace any light bulbs as soon as they burn out.  Set up your furniture so you have a clear path. Avoid moving your furniture around.  If any of your floors are uneven, fix them.  If there are any pets around you, be aware of where they are.  Review your medicines with your doctor. Some medicines can make you feel dizzy. This can increase your chance of falling. Ask your doctor what other things that you can do to help prevent falls. This information is not intended to replace advice given to you by your health care provider. Make sure you discuss any questions you have with your health care provider. Document Released: 08/24/2009 Document Revised: 04/04/2016 Document Reviewed: 12/02/2014 Elsevier Interactive Patient Education  2017 [employer].

## 2020-03-21 ENCOUNTER — Ambulatory Visit: Payer: PPO | Admitting: Family Medicine

## 2020-03-22 ENCOUNTER — Other Ambulatory Visit: Payer: Self-pay | Admitting: Nurse Practitioner

## 2020-03-23 ENCOUNTER — Other Ambulatory Visit: Payer: Self-pay

## 2020-03-23 MED ORDER — NOVOLOG FLEXPEN 100 UNIT/ML ~~LOC~~ SOPN: PEN_INJECTOR | SUBCUTANEOUS | 1 refills | Status: DC

## 2020-03-23 MED ORDER — LANTUS SOLOSTAR 100 UNIT/ML ~~LOC~~ SOPN: PEN_INJECTOR | SUBCUTANEOUS | 1 refills | Status: DC

## 2020-03-27 ENCOUNTER — Ambulatory Visit: Payer: PPO | Admitting: Family Medicine

## 2020-04-04 ENCOUNTER — Ambulatory Visit: Payer: PPO | Attending: Internal Medicine

## 2020-04-04 DIAGNOSIS — Z23 Encounter for immunization: Secondary | ICD-10-CM

## 2020-04-04 NOTE — Progress Notes (Signed)
   Covid-19 Vaccination Clinic  Name:  Ashley Armstrong    MRN: [id number] DOB: 1966-02-22  04/04/2020  Ashley Armstrong was observed post Covid-19 immunization for 15 minutes without incident. She was provided with Vaccine Information Sheet and instruction to access the V-Safe system.   Ashley Armstrong was instructed to call 911 with any severe reactions post vaccine: Marland Kitchen Difficulty breathing  . Swelling of face and throat  . A fast heartbeat  . A bad rash all over body  . Dizziness and weakness   Immunizations Administered    Name Date Dose VIS Date Route   Moderna COVID-19 Vaccine 04/04/2020  8:59 AM 0.5 mL 10/2019 Intramuscular   Manufacturer: Moderna   Lot: [id number]   [id number]

## 2020-04-18 DIAGNOSIS — E785 Hyperlipidemia, unspecified: Secondary | ICD-10-CM | POA: Diagnosis not present

## 2020-04-18 DIAGNOSIS — E1140 Type 2 diabetes mellitus with diabetic neuropathy, unspecified: Secondary | ICD-10-CM | POA: Diagnosis not present

## 2020-04-18 DIAGNOSIS — I10 Essential (primary) hypertension: Secondary | ICD-10-CM | POA: Diagnosis not present

## 2020-04-18 DIAGNOSIS — Z794 Long term (current) use of insulin: Secondary | ICD-10-CM | POA: Diagnosis not present

## 2020-04-19 ENCOUNTER — Encounter: Payer: Self-pay | Admitting: Family Medicine

## 2020-04-19 LAB — COMPLETE METABOLIC PANEL WITH GFR
AG Ratio: 1.8 (calc) (ref 1.0–2.5)
ALT: 21 U/L (ref 6–29)
AST: 19 U/L (ref 10–35)
Albumin: 3.9 g/dL (ref 3.6–5.1)
Alkaline phosphatase (APISO): 73 U/L (ref 37–153)
BUN: 18 mg/dL (ref 7–25)
CO2: 26 mmol/L (ref 20–32)
Calcium: 9.3 mg/dL (ref 8.6–10.4)
Chloride: 110 mmol/L (ref 98–110)
Creat: 0.99 mg/dL (ref 0.50–1.05)
GFR, Est African American: 76 mL/min/{1.73_m2} (ref >=60)
GFR, Est Non African American: 66 mL/min/{1.73_m2} (ref >=60)
Globulin: 2.2 g/dL (calc) (ref 1.9–3.7)
Glucose, Bld: 89 mg/dL (ref 65–99)
Potassium: 4.6 mmol/L (ref 3.5–5.3)
Sodium: 142 mmol/L (ref 135–146)
Total Bilirubin: 0.4 mg/dL (ref 0.2–1.2)
Total Protein: 6.1 g/dL (ref 6.1–8.1)

## 2020-04-19 LAB — HEMOGLOBIN A1C
Hgb A1c MFr Bld: 8.5 % of total Hgb — ABNORMAL HIGH (ref <5.7)
Mean Plasma Glucose: 197 (calc)
eAG (mmol/L): 10.9 (calc)

## 2020-04-19 LAB — LIPID PANEL
Cholesterol: 187 mg/dL (ref <200)
HDL: 67 mg/dL (ref >=50)
LDL Cholesterol (Calc): 108 mg/dL (calc) — ABNORMAL HIGH
Non-HDL Cholesterol (Calc): 120 mg/dL (calc) (ref <130)
Total CHOL/HDL Ratio: 2.8 (calc) (ref <5.0)
Triglycerides: 42 mg/dL (ref <150)

## 2020-04-19 LAB — MICROALBUMIN / CREATININE URINE RATIO
Creatinine, Urine: 150 mg/dL (ref 20–275)
Microalb Creat Ratio: 17 mcg/mg creat (ref <30)
Microalb, Ur: 2.6 mg/dL

## 2020-04-19 LAB — CBC
HCT: 35.6 % (ref 35.0–45.0)
Hemoglobin: 11.7 g/dL (ref 11.7–15.5)
MCH: 28.5 pg (ref 27.0–33.0)
MCHC: 32.9 g/dL (ref 32.0–36.0)
MCV: 86.6 fL (ref 80.0–100.0)
MPV: 10.1 fL (ref 7.5–12.5)
Platelets: 267 10*3/uL (ref 140–400)
RBC: 4.11 10*6/uL (ref 3.80–5.10)
RDW: 12.8 % (ref 11.0–15.0)
WBC: 6 10*3/uL (ref 3.8–10.8)

## 2020-05-02 ENCOUNTER — Other Ambulatory Visit: Payer: Self-pay

## 2020-05-02 MED ORDER — LANTUS SOLOSTAR 100 UNIT/ML ~~LOC~~ SOPN: PEN_INJECTOR | SUBCUTANEOUS | 1 refills | Status: DC

## 2020-05-17 ENCOUNTER — Ambulatory Visit: Payer: PPO | Admitting: Podiatry

## 2020-05-18 ENCOUNTER — Telehealth: Payer: Self-pay | Admitting: Endocrinology

## 2020-05-18 ENCOUNTER — Other Ambulatory Visit: Payer: Self-pay

## 2020-05-18 MED ORDER — FREESTYLE LIBRE 14 DAY READER DEVI: 0 refills | Status: DC

## 2020-05-18 MED ORDER — FREESTYLE LIBRE 14 DAY SENSOR MISC: 1.0000 | 0 refills | Status: DC

## 2020-05-18 NOTE — Telephone Encounter (Signed)
Rx sent 

## 2020-05-18 NOTE — Telephone Encounter (Signed)
Medication Refill Request  Did you call your pharmacy and request this refill first? Yes  . If patient has not contacted pharmacy first, instruct them to do so for future refills.  . Remind them that contacting the pharmacy for their refill is the quickest method to get the refill.  . Refill policy also stated that it will take anywhere between 24-72 hours to receive the refill.    Name of medication? Freestyle Libre 1 device AND sensors - patient said she believes her device is broken and she used her sensors up because each time she tried using one it didn't work.  Is this a 90 day supply?  Name and location of pharmacy?  [place], [place] Phone:  [phone]  Fax:  [phone]

## 2020-06-07 ENCOUNTER — Other Ambulatory Visit: Payer: Self-pay | Admitting: *Deleted

## 2020-06-07 ENCOUNTER — Other Ambulatory Visit: Payer: Self-pay | Admitting: Endocrinology

## 2020-06-07 MED ORDER — NOVOLOG FLEXPEN 100 UNIT/ML ~~LOC~~ SOPN: PEN_INJECTOR | SUBCUTANEOUS | 0 refills | Status: DC

## 2020-06-14 DIAGNOSIS — H35371 Puckering of macula, right eye: Secondary | ICD-10-CM | POA: Diagnosis not present

## 2020-06-14 DIAGNOSIS — H3582 Retinal ischemia: Secondary | ICD-10-CM | POA: Diagnosis not present

## 2020-06-14 DIAGNOSIS — E103513 Type 1 diabetes mellitus with proliferative diabetic retinopathy with macular edema, bilateral: Secondary | ICD-10-CM | POA: Diagnosis not present

## 2020-06-14 DIAGNOSIS — H43391 Other vitreous opacities, right eye: Secondary | ICD-10-CM | POA: Diagnosis not present

## 2020-06-15 ENCOUNTER — Encounter: Payer: Self-pay | Admitting: Family Medicine

## 2020-06-15 ENCOUNTER — Telehealth (INDEPENDENT_AMBULATORY_CARE_PROVIDER_SITE_OTHER): Payer: PPO | Admitting: Family Medicine

## 2020-06-15 ENCOUNTER — Other Ambulatory Visit: Payer: Self-pay

## 2020-06-15 VITALS — BP 112/76 | Ht 68.0 in | Wt 136.0 lb

## 2020-06-15 DIAGNOSIS — E1140 Type 2 diabetes mellitus with diabetic neuropathy, unspecified: Secondary | ICD-10-CM | POA: Diagnosis not present

## 2020-06-15 DIAGNOSIS — Z794 Long term (current) use of insulin: Secondary | ICD-10-CM

## 2020-06-15 DIAGNOSIS — K047 Periapical abscess without sinus: Secondary | ICD-10-CM | POA: Diagnosis not present

## 2020-06-15 MED ORDER — FLUCONAZOLE 150 MG PO TABS
150.0000 mg | ORAL_TABLET | Freq: Once | ORAL | 0 refills | Status: AC
Start: 2020-06-15 — End: 2020-06-15

## 2020-06-15 MED ORDER — CEPHALEXIN 500 MG PO CAPS: 500.0000 mg | ORAL_CAPSULE | Freq: Three times a day (TID) | ORAL | 0 refills | Status: DC

## 2020-06-15 NOTE — Patient Instructions (Signed)
F/U in office as before  Call if you need me before  Keflex is prescribed for your dental infection and one yeast pill in case you get vaginal yeast infection  You NEED a dentist to address your infected teeth as soon as possible!  Continue to work consitently at normalizing your blood sugar  Thanks for choosing [facility], we consider it a privelige to serve you.

## 2020-06-15 NOTE — Progress Notes (Signed)
Virtual Visit via Telephone Note  I connected with Ashley Armstrong on 06/15/20 at  2:40 PM EDT by telephone and verified that I am speaking with the correct person using two identifiers.  Location: Patient: home Provider: office   I discussed the limitations, risks, security and privacy concerns of performing an evaluation and management service by telephone and the availability of in person appointments. I also discussed with the patient that there may be a patient responsible charge related to this service. The patient expressed understanding and agreed to proceed.   History of Present Illness: 5 day h/o right facial pain radaiting to right ear and temple up to a 9 at night, had chills, states that a filling fell out and another is falling out, trying to get to a dentist   Observations/Objective: BP 112/76 Comment: 1 week ago  Ht 5\' 8"  (1.727 m)   Wt 136 lb (61.7 kg)   LMP 07/26/2013   BMI 20.68 kg/m  Good communication with no confusion and intact memory. Alert and oriented x 3 No signs of respiratory distress during speech    Assessment and Plan: Dental infection Antibiotic prescribed and pt encouraged to  See dentist asap  Type 2 diabetes mellitus with diabetic neuropathy, unspecified ([place]) Uncontrolled,but improving,  Needs to continue to  f/u with Endo Ashley Armstrong is reminded of the importance of commitment to daily physical activity for 30 minutes or more, as able and the need to limit carbohydrate intake to 30 to 60 grams per meal to help with blood sugar control.   The need to take medication as prescribed, test blood sugar as directed, and to call between visits if there is a concern that blood sugar is uncontrolled is also discussed.   Ashley Armstrong is reminded of the importance of daily foot exam, annual eye examination, and good blood sugar, blood pressure and cholesterol control.  Diabetic Labs Latest Ref Rng & Units 04/18/2020 03/20/2020 12/21/2019 09/07/2019 07/26/2019   HbA1c <5.7 % of total Hgb 8.5(H) 9.6(A) 9.8(A) 9.2(A) -  Microalbumin mg/dL 2.6 - - - 1.7  Micro/Creat Ratio <30 mcg/mg creat 17 - - - 12  Chol <200 mg/dL 187 - - - 154  HDL > OR = 50 mg/dL 67 - - - 59  Calc LDL mg/dL (calc) 108(H) - - - 77  Triglycerides <150 mg/dL 42 - - - 94  Creatinine 0.50 - 1.05 mg/dL 0.99 - - - 1.12(H)   BP/Weight 06/15/2020 03/20/2020 03/20/2020 03/08/2020 12/21/2019 10/26/2019 76/28/3151  Systolic BP 761 607 371 062 694 854 627  Diastolic BP 76 78 78 68 80 74 68  Wt. (Lbs) 136 133 133.8 131.2 134.6 138.4 133  BMI 20.68 20.22 20.34 19.95 20.47 21.04 20.22   Foot/eye exam completion dates Latest Ref Rng & Units 10/26/2019 07/31/2018  Eye Exam No Retinopathy - -  Foot Form Completion - Done Done           Follow Up Instructions:    I discussed the assessment and treatment plan with the patient. The patient was provided an opportunity to ask questions and all were answered. The patient agreed with the plan and demonstrated an understanding of the instructions.   The patient was advised to call back or seek an in-person evaluation if the symptoms worsen or if the condition fails to improve as anticipated.  I provided 10 minutes of non-face-to-face time during this encounter.   [name], MD  ]

## 2020-06-16 ENCOUNTER — Encounter: Payer: Self-pay | Admitting: Family Medicine

## 2020-06-16 NOTE — Assessment & Plan Note (Signed)
Antibiotic prescribed and pt encouraged to  See dentist asap

## 2020-06-16 NOTE — Assessment & Plan Note (Signed)
Uncontrolled,but improving,  Needs to continue to  f/u with Endo [name] is reminded of the importance of commitment to daily physical activity for 30 minutes or more, as able and the need to limit carbohydrate intake to 30 to 60 grams per meal to help with blood sugar control.   The need to take medication as prescribed, test blood sugar as directed, and to call between visits if there is a concern that blood sugar is uncontrolled is also discussed.   [name] is reminded of the importance of daily foot exam, annual eye examination, and good blood sugar, blood pressure and cholesterol control.  Diabetic Labs Latest Ref Rng & Units 04/18/2020 03/20/2020 12/21/2019 09/07/2019 07/26/2019  HbA1c <5.7 % of total Hgb 8.5(H) 9.6(A) 9.8(A) 9.2(A) -  Microalbumin mg/dL 2.6 - - - 1.7  Micro/Creat Ratio <30 mcg/mg creat 17 - - - 12  Chol <200 mg/dL 187 - - - 154  HDL > OR = 50 mg/dL 67 - - - 59  Calc LDL mg/dL (calc) 108(H) - - - 77  Triglycerides <150 mg/dL 42 - - - 94  Creatinine 0.50 - 1.05 mg/dL 0.99 - - - 1.12(H)   BP/Weight 06/15/2020 03/20/2020 03/20/2020 03/08/2020 12/21/2019 10/26/2019 43/60/6770  Systolic BP 340 352 481 859 093 112 162  Diastolic BP 76 78 78 68 80 74 68  Wt. (Lbs) 136 133 133.8 131.2 134.6 138.4 133  BMI 20.68 20.22 20.34 19.95 20.47 21.04 20.22   Foot/eye exam completion dates Latest Ref Rng & Units 10/26/2019 07/31/2018  Eye Exam No Retinopathy - -  Foot Form Completion - Done Done

## 2020-06-22 ENCOUNTER — Ambulatory Visit: Payer: PPO | Admitting: Endocrinology

## 2020-06-27 ENCOUNTER — Other Ambulatory Visit: Payer: Self-pay

## 2020-06-27 ENCOUNTER — Encounter: Payer: Self-pay | Admitting: Endocrinology

## 2020-06-27 ENCOUNTER — Other Ambulatory Visit: Payer: Self-pay | Admitting: *Deleted

## 2020-06-27 ENCOUNTER — Ambulatory Visit (INDEPENDENT_AMBULATORY_CARE_PROVIDER_SITE_OTHER): Payer: PPO | Admitting: Endocrinology

## 2020-06-27 VITALS — BP 110/70 | HR 79 | Ht 68.0 in | Wt 131.0 lb

## 2020-06-27 DIAGNOSIS — E1065 Type 1 diabetes mellitus with hyperglycemia: Secondary | ICD-10-CM | POA: Diagnosis not present

## 2020-06-27 DIAGNOSIS — E1043 Type 1 diabetes mellitus with diabetic autonomic (poly)neuropathy: Secondary | ICD-10-CM

## 2020-06-27 DIAGNOSIS — K3184 Gastroparesis: Secondary | ICD-10-CM | POA: Diagnosis not present

## 2020-06-27 MED ORDER — FREESTYLE LIBRE 2 SENSOR MISC: 5 refills | Status: DC

## 2020-06-27 MED ORDER — LANTUS SOLOSTAR 100 UNIT/ML ~~LOC~~ SOPN: 68.0000 [IU] | PEN_INJECTOR | Freq: Every day | SUBCUTANEOUS | 0 refills | Status: DC

## 2020-06-27 NOTE — Progress Notes (Signed)
Patient ID: Ashley Armstrong, female   DOB: 1966-01-06, 54 y.o.   MRN: [id number]           Reason for Appointment: Follow-up for Type 1 Diabetes  Referring physician: Dr. [name]   History of Present Illness:          Date of diagnosis of type 1 diabetes mellitus: Age 73       Background history:  She has been on insulin practically since her diagnosis was made.  Initially she had symptoms of weight loss and blurred vision For several years has been on Lantus and NovoLog but usually has had poor control with A1c mostly between 8-10%  Recent history:     INSULIN regimen is:  Lantus 36--32 breakfast and pm.  NovoLog mostly 8-10 in am  12 units before meals       Current management, blood sugar patterns and problems identified:  Her A1c is last 8.5 done in June, previously was up to 9.8    She is finally able to control her carbohydrate intake better and not doing as much stress eating especially at night  Recently also her blood sugar may be low normal or slightly low on her sensor on waking up  However as discussed below in the CGM interpretation she does have significant postprandial hyperglycemia during the day which likely fluctuates with her carbohydrate intake  Again does not actually count carbohydrates  At breakfast her carbohydrate intake may be variable, today had 2 types of carbohydrate with toast and grits but otherwise may sometimes have only cereal or toast and eggs  She is usually missing her NovoLog based on premeal blood sugars and generally taking less at breakfast  She has done a little more walking at times which she thinks has helped her sugars also  Weight is down slightly  Blood sugars are mostly averaging close to 200 during the day but overall better than before  Has been reluctant to consider insulin pump and prefers to continue injections           Meal times are:  Breakfast is 7 AM at Lunch:  PM dinner:3 PM  Typical meal intake:  Breakfast is sometimes high fat but otherwise eggs and toast.  Lunch usually a mixed meal and evening meal may be a sandwich with bland food.  Snacks will be cheese and crackers Usually avoiding drinks with sugar            Exercise: None    Glucose monitoring:  done 4 times a day         Glucometer:  Freestyle libre.       Blood Glucose readings by time of day and averages from download    CONTINUOUS GLUCOSE MONITORING RECORD INTERPRETATION    Dates of Recording: Last 2 weeks  Sensor description: Libre 2  Results statistics:   CGM use % of time  97  Average and SD 195  Time in range    43    %  % Time Above 180  35  % Time above 250  21  % Time Below target 1    PRE-MEAL Fasting Lunch Dinner Bedtime Overall  Glucose range:       Mean/median:  144  202  190  202  195   POST-MEAL PC Breakfast PC Lunch PC Dinner  Glucose range:     Mean/median:  200  223  212    Glycemic patterns summary: Although she has more variability  compared to her last visit her overall control and timing range is better as before hypoglycemia has present during the daytime mostly with blood sugars rising progressively until early afternoon and then more variability to the evening and overnight.  Hypoglycemia has been minimal  Hyperglycemic episodes are occasional. At all times not frequently after breakfast or lunch and sometimes after dinner or late at night  Hypoglycemic episodes occurred occasionally early morning including the last 2 mornings on waking up  Overnight periods: Blood sugars are averaging nearly 200 at midnight and then progressively decline until 5-6 AM towards normal range.  Low blood sugars only occasionally and mild, transient on waking up  Preprandial periods: Blood sugars are near normal at breakfast, mostly averaging 200 before lunch and dinner  Postprandial periods:   Most meals show significant rise in postprandial blood sugars to a variable extent, but on an average  blood sugars are not rising after lunch and dinner as much as breakfast   Previous data:  CGM use % of time  96  Average and SD  233, GV 29  Time in range      21%, was 21  % Time Above 180  39  % Time above 250  40  % Time Below target  0    PRE-MEAL Fasting Lunch Dinner Bedtime Overall  Glucose range:       Mean/median:  228  252  220  1/67  233   POST-MEAL PC Breakfast PC Lunch PC Dinner  Glucose range:     Mean/median:    254    Glycemic patterns summary: Blood sugars are above the target range on an average throughout the day and average blood sugar is also over 200 consistently except 4-6 AM Highest blood sugars are before noon and before midnight Recently blood sugars are generally showing the highest readings after dinner Lowest readings are early morning before 6 AM without hypoglycemia  Hyperglycemic episodes are occurring after meals with some variability and occasionally overnight  Hypoglycemic episodes have not been documented  Overnight periods: Blood sugars are usually high around 250 at midnight and then gradually decrease until about 5-6 AM but still averaging over 180  Preprandial periods: Blood sugars are the lowest before breakfast on an average but well over 200 at lunch and dinner  Postprandial periods:   After breakfast:   Blood sugars are usually rising most of the time and on an average going higher about 50 points After lunch: Blood sugars are variably high, not clear what her lunchtime meal timing is After dinner: Glucose is generally rising to a variable extent after eating although usually high before the meal also   Previous data:   CGM use % of time  87  Average and SD  230, GV 27  Time in range      21%  % Time Above 180  43  % Time above 250  35  % Time Below target  1    PRE-MEAL Fasting Lunch Dinner Bedtime Overall  Glucose range:       Mean/median:  200   221  255  230   POST-MEAL PC Breakfast PC Lunch PC Dinner  Glucose  range:     Mean/median:  223  253  263     Dietician visit, most recent: 11/18  Weight history:  Wt Readings from Last 3 Encounters:  06/27/20 131 lb (59.4 kg)  06/15/20 136 lb (61.7 kg)  03/20/20 133 lb (60.3 kg)  Glycemic control:   Lab Results  Component Value Date   HGBA1C 8.5 (H) 04/18/2020   HGBA1C 9.6 (A) 03/20/2020   HGBA1C 9.8 (A) 12/21/2019   Lab Results  Component Value Date   MICROALBUR 2.6 04/18/2020   LDLCALC 108 (H) 04/18/2020   CREATININE 0.99 04/18/2020   Lab Results  Component Value Date   MICRALBCREAT 17 04/18/2020    No results found for: FRUCTOSAMINE  No visits with results within 1 Week(s) from this visit.  Latest known visit with results is:  Office Visit on 03/20/2020  Component Date Value Ref Range Status   Hemoglobin A1C 03/20/2020 9.6* 4.0 - 5.6 % Final    Allergies as of 06/27/2020   No Known Allergies     Medication List       Accurate as of June 27, 2020  4:09 PM. If you have any questions, ask your nurse or doctor.        calcium-vitamin D 500-200 MG-UNIT Tabs tablet Commonly known as: OSCAL WITH D Take 1 tablet by mouth daily.   cephALEXin 500 MG capsule Commonly known as: KEFLEX Take 1 capsule (500 mg total) by mouth 3 (three) times daily.   Durezol 0.05 % Emul Generic drug: Difluprednate Apply 1 drop to eye daily at 8 pm.   Fish Oil 1000 MG Caps Take 1 capsule by mouth daily.   FreeStyle Libre 2 Reader [place] 1 each by Does not apply route See admin instructions. Use Freestyle libre 2 reader to monitor blood sugars continuously.   FreeStyle Libre 14 Day Reader [place] reader to monitor blood sugars continuously.   FreeStyle Libre 2 Sensor Misc Use to check blood sugars. Change every 14 days. What changed:   how to take this  additional instructions Changed by: Glori Bickers, CMA   FreeStyle Precision Neo System w/Device Kit 1 kit by Does not apply route 2 (two) times daily.    FreeStyle Precision Neo Test test strip Generic drug: glucose blood Check blood sugar twice daily   lamoTRIgine 100 MG tablet Commonly known as: LAMICTAL Take 100 mg by mouth daily.   Lantus SoloStar 100 UNIT/ML Solostar Pen Generic drug: insulin glargine Inject 36 units under the skin in the morning and 32 units in the evening. What changed: additional instructions   Lantus SoloStar 100 UNIT/ML Solostar Pen Generic drug: insulin glargine Inject 68 Units into the skin daily. Inject 40 units under the skin in the morning and 28 units in the evening. What changed: how much to take Changed by: Glori Bickers, CMA   lisinopril 2.5 MG tablet Commonly known as: ZESTRIL TAKE ONE (1) TABLET BY MOUTH EVERY DAY   metoCLOPramide 5 MG tablet Commonly known as: REGLAN TAKE ONE TO TWO TABLETS BY MOUTH EVERY SIX HOURS AS NEEDED FOR NAUSEAOR VOMITING   mirtazapine 30 MG tablet Commonly known as: REMERON Take 1 tablet by mouth daily at 8 pm.   montelukast 10 MG tablet Commonly known as: SINGULAIR TAKE ONE TABLET (10MG TOTAL) BY MOUTH ATBEDTIME   NovoLOG FlexPen 100 UNIT/ML FlexPen Generic drug: insulin aspart INJECT 8 TO 12 UNITS SUBCUTANEOUSLY BEFORE EACH MEAL AS DIRECTED. ADJUST PERSLIDING SCALE DEPENDING ON SUGAR READING. MAX DOSE PER DAY IS 51 UNITS.   omeprazole 40 MG capsule Commonly known as: PRILOSEC TAKE ONE CAPSULE BY MOUTH 30 MINUTES PRIOR TO BREAKFAST AND SUPPER   ondansetron 4 MG tablet Commonly known as: ZOFRAN TAKE ONE TABLET BY MOUTH EVERY EIGHT HOURS AS NEEDED  FOR NAUSEA OR VOMITING   OneTouch Delica Lancets 37C Misc USE FOUR TIMES A DAY AS DIRECTED.   polyethylene glycol 17 g packet Commonly known as: MIRALAX / GLYCOLAX Take 17 g by mouth daily as needed.   pravastatin 80 MG tablet Commonly known as: PRAVACHOL TAKE ONE TABLET (80MG TOTAL) BY MOUTH EVERY EVENING   Sure Comfort Pen Needles 30G X 8 MM Misc Generic drug: Insulin Pen Needle 4 boxes (88day  supply)   Valium 10 MG tablet Generic drug: diazepam Take one (1) Tablet Once a Day, As Necessary for panic   vitamin B-12 100 MCG tablet Commonly known as: CYANOCOBALAMIN Take 1 tablet by mouth daily.       Allergies: No Known Allergies  Past Medical History:  Diagnosis Date   Anxiety    Dyslipidemia    Gastroparesis due to DM ([facility]) OCT 2014   75% AT 2 HRS, GLU >    H/O eye surgery 01/2018   Headache(784.0)    IDDM (insulin dependent diabetes mellitus)    Neuropathy    Nicotine addiction    TIA (transient ischemic attack)    Dr. Merlene Laughter    Past Surgical History:  Procedure Laterality Date   Cataract surgery Bilateral 01/20/2017, 02/03/2017   COLONOSCOPY N/A 05/09/2016   Dr. Oneida Alar: 2 hyperplastic polyps removed.  Next colonoscopy in 2027   ESOPHAGOGASTRODUODENOSCOPY N/A 08/31/2013   SLF: 1. Earky satiety nausea may be due to Gastroparesis/pyloric channel stenosis. 2. small hiatal hernia 3. Moderate erosive gastritis.    EYE SURGERY  01/2018   laser surgery bilateral eye      Family History  Problem Relation Age of Onset   Hypertension Mother    Diabetes Mother    Hyperlipidemia Mother    Rosacea Mother    Hypertension Father    Diabetes Brother    Breast cancer Maternal Aunt    Colon cancer Neg Hx     Social History:  reports that she quit smoking about 8 years ago. Her smoking use included cigarettes. She has a 36.00 pack-year smoking history. She has never used smokeless tobacco. She reports that she does not drink alcohol and does not use drugs.   Review of Systems   Lipid history: Followed by PCP, currently on pravastatin 80 mg Labs as follows    Lab Results  Component Value Date   CHOL 187 04/18/2020   HDL 67 04/18/2020   LDLCALC 108 (H) 04/18/2020   TRIG 42 04/18/2020   CHOLHDL 2.8 04/18/2020           Hypertension: This is well controlled  BP Readings from Last 3 Encounters:  06/27/20 110/70  06/15/20 112/76   03/20/20 134/78    Most recent eye exam was in 01/2019  Most recent foot exam: 12/20  GASTROPARESIS: her nausea is recently better controlled on Reglan twice daily Will take Zofran as needed also Followed by gastroenterologist  She is on Reglan before meals, the gastroenterologist told her to not take more than 2 tablets a day  Currently known complications of diabetes: Gastroparesis, neuropathy, retinopathy, no nephropathy  LABS:  No visits with results within 1 Week(s) from this visit.  Latest known visit with results is:  Office Visit on 03/20/2020  Component Date Value Ref Range Status   Hemoglobin A1C 03/20/2020 9.6* 4.0 - 5.6 % Final    Physical Examination:  BP 110/70 (BP Location: Left Arm, Patient Position: Sitting, Cuff Size: Normal)    Pulse 79    Ht  '5\' 8"'  (1.727 m)    Wt 131 lb (59.4 kg)    LMP 07/26/2013    SpO2 97%    BMI 19.92 kg/m        ASSESSMENT:  Diabetes type 1, poorly controlled with multiple complications  See history of present illness for detailed discussion of current diabetes management, blood sugar patterns from her CGM download and problems identified  Her A1c was last 8.5  Recent freestyle libre download indicates GMI of 8%  As discussed in detail above with her trying to be better with diet and cutting back on snacking and carbohydrate intake her blood sugars are better Also she is trying to do a little walking As before appears to need more insulin during the day and less overnight Now her sugars are trending low normal in the mornings since she is sleeping better at night and not stress eating However not taking enough insulin to cover most of her meals especially breakfast Demonstrated the rise of her blood sugars after breakfast and daily patterns on her CGM  As before mealtime insulin is being calculated based on premeal reading more than actual carbohydrate intake No increase in hypoglycemia which is minimal and  transient  GASTROPARESIS: On Reglan with better control now     PLAN:    She will need to take 40 Lantus in the morning to cover daytime hyperglycemia better and reduce evening dose by 4 units down to 28, needs to likely target this to get blood sugars before lunch and dinner down better  Also this will reduce tendency to low sugars overnight  Add at least 2 to 4 units more for breakfast especially if eating cereal compared to the 8-10 units she is taking now  Also will need further increase in mealtime coverage at lunch or dinner if eating more carbohydrates  Take NovoLog consistently before eating unless blood sugars are low She will be given prescription for the freestyle libre version 2 which she thinks is helpful to alert her against low sugars Continue to take extra insulin for postprandial hyperglycemia with the help of the alerts for high readings  A1c on the next visit   Patient Instructions  Lantus 40 in am and 28 in pm   Also check blood sugars about 2 hours after meals and do this after different meals by rotation  Recommended blood sugar levels on waking up are 90-130 and about 2 hours after meal is 130-160      Elayne Snare 06/27/2020, 4:09 PM   Note: This office note was prepared with Dragon voice recognition system technology. Any transcriptional errors that result from this process are unintentional.

## 2020-06-27 NOTE — Patient Instructions (Addendum)
Lantus 40 in am and 28 in pm   Also check blood sugars about 2 hours after meals and do this after different meals by rotation  Recommended blood sugar levels on waking up are 90-130 and about 2 hours after meal is 130-160

## 2020-06-28 ENCOUNTER — Other Ambulatory Visit: Payer: Self-pay | Admitting: *Deleted

## 2020-07-04 ENCOUNTER — Telehealth: Payer: Self-pay | Admitting: Endocrinology

## 2020-07-04 NOTE — Telephone Encounter (Signed)
Patient called re: status of Letter Dr. Dwyane Dee is writing for patient to be excused from [facility]. Patient states she needs the letter from Dr. Dwyane Dee sent asap (Court date is 07/17/20 and [place] told patient the letter needs to be sent asap) via fax# [phone] Attn: Cathie Olden. Patient requests to be called at ph# [phone] once the above has been completed.

## 2020-07-05 ENCOUNTER — Encounter: Payer: Self-pay | Admitting: Endocrinology

## 2020-07-05 DIAGNOSIS — Z0279 Encounter for issue of other medical certificate: Secondary | ICD-10-CM

## 2020-07-05 NOTE — Telephone Encounter (Signed)
Faxed letter of exemption to [employer]. Called patient to inform. No answer. Left message.

## 2020-07-05 NOTE — Telephone Encounter (Signed)
Would you like a letter drawn up for this patient? Please advise.

## 2020-07-06 ENCOUNTER — Other Ambulatory Visit: Payer: Self-pay

## 2020-07-06 ENCOUNTER — Ambulatory Visit (INDEPENDENT_AMBULATORY_CARE_PROVIDER_SITE_OTHER): Payer: PPO | Admitting: Family Medicine

## 2020-07-06 ENCOUNTER — Encounter: Payer: Self-pay | Admitting: Family Medicine

## 2020-07-06 VITALS — BP 106/70 | HR 79 | Resp 16 | Ht 68.0 in | Wt 130.0 lb

## 2020-07-06 DIAGNOSIS — E559 Vitamin D deficiency, unspecified: Secondary | ICD-10-CM | POA: Diagnosis not present

## 2020-07-06 DIAGNOSIS — F418 Other specified anxiety disorders: Secondary | ICD-10-CM

## 2020-07-06 DIAGNOSIS — Z794 Long term (current) use of insulin: Secondary | ICD-10-CM | POA: Diagnosis not present

## 2020-07-06 DIAGNOSIS — E785 Hyperlipidemia, unspecified: Secondary | ICD-10-CM

## 2020-07-06 DIAGNOSIS — E1140 Type 2 diabetes mellitus with diabetic neuropathy, unspecified: Secondary | ICD-10-CM

## 2020-07-06 DIAGNOSIS — Z23 Encounter for immunization: Secondary | ICD-10-CM | POA: Diagnosis not present

## 2020-07-06 NOTE — Progress Notes (Signed)
Ashley Armstrong     MRN: [id number]      DOB: 29-Nov-1965   HPI Ashley Armstrong is here for follow up and re-evaluation of chronic medical conditions, medication management and review of any available recent lab and radiology data.  Preventive health is updated, specifically  Cancer screening and Immunization.   Questions or concerns regarding consultations or procedures which the PT has had in the interim are  addressed. The PT denies any adverse reactions to current medications since the last visit.  There are no new concerns.  There are no specific complaints   ROS Denies recent fever or chills. Denies sinus pressure, nasal congestion, ear pain or sore throat. Denies chest congestion, productive cough or wheezing. Denies chest pains, palpitations and leg swelling Denies abdominal pain, nausea, vomiting,diarrhea or constipation.   Denies dysuria, frequency, hesitancy or incontinence. Denies joint pain, swelling and limitation in mobility. Denies headaches, seizures, numbness, or tingling. Denies depression, anxiety or insomnia. Denies skin break down or rash.   PE  BP 106/70   Pulse 79   Resp 16   Ht 5\' 8"  (1.727 m)   Wt 130 lb 0.6 oz (59 kg)   LMP 07/26/2013   SpO2 98%   BMI 19.77 kg/m   Patient alert and oriented and in no cardiopulmonary distress.  HEENT: No facial asymmetry, EOMI,     Neck supple .  Chest: Clear to auscultation bilaterally.  CVS: S1, S2 no murmurs, no S3.Regular rate.  ABD: Soft non tender.   Ext: No edema  MS: Adequate ROM spine, shoulders, hips and knees.  Skin: Intact, no ulcerations or rash noted.  Psych: Good eye contact, normal affect. Memory intact not anxious or depressed appearing.  CNS: CN 2-12 intact, power,  normal throughout.no focal deficits noted.   Assessment & Plan  Type 2 diabetes mellitus with diabetic neuropathy, unspecified ([place]) Ashley Armstrong is reminded of the importance of commitment to daily physical activity for 30  minutes or more, as able and the need to limit carbohydrate intake to 30 to 60 grams per meal to help with blood sugar control.   The need to take medication as prescribed, test blood sugar as directed, and to call between visits if there is a concern that blood sugar is uncontrolled is also discussed.   Ashley Armstrong is reminded of the importance of daily foot exam, annual eye examination, and good blood sugar, blood pressure and cholesterol control. Improving, managed by endo  Diabetic Labs Latest Ref Rng & Units 04/18/2020 03/20/2020 12/21/2019 09/07/2019 07/26/2019  HbA1c <5.7 % of total Hgb 8.5(H) 9.6(A) 9.8(A) 9.2(A) -  Microalbumin mg/dL 2.6 - - - 1.7  Micro/Creat Ratio <30 mcg/mg creat 17 - - - 12  Chol <200 mg/dL 187 - - - 154  HDL > OR = 50 mg/dL 67 - - - 59  Calc LDL mg/dL (calc) 108(H) - - - 77  Triglycerides <150 mg/dL 42 - - - 94  Creatinine 0.50 - 1.05 mg/dL 0.99 - - - 1.12(H)   BP/Weight 07/06/2020 06/27/2020 06/15/2020 03/20/2020 03/20/2020 4/40/3474 12/16/9561  Systolic BP 875 643 329 518 841 660 630  Diastolic BP 70 70 76 78 78 68 80  Wt. (Lbs) 130.04 131 136 133 133.8 131.2 134.6  BMI 19.77 19.92 20.68 20.22 20.34 19.95 20.47   Foot/eye exam completion dates Latest Ref Rng & Units 10/26/2019 07/31/2018  Eye Exam No Retinopathy - -  Foot Form Completion - Done Done  Hyperlipidemia LDL goal <100 Hyperlipidemia:Low fat diet discussed and encouraged.   Lipid Panel  Lab Results  Component Value Date   CHOL 187 04/18/2020   HDL 67 04/18/2020   LDLCALC 108 (H) 04/18/2020   TRIG 42 04/18/2020   CHOLHDL 2.8 04/18/2020  needs to lower fat intake     Depression with anxiety Improving with time following loss of partner and pet in 2020-being treated by new Psychiatrist, using spirituality  As a main coping tool

## 2020-07-06 NOTE — Patient Instructions (Addendum)
Annual exam in office with MD December when due   Fasting lipid, cmp and EGFr  , TSH, vit D 1 week before visit  Flu vaccine today  Please schedule December mammogram at checkout  Continue to work on your diabetes, goal is under 8

## 2020-07-07 ENCOUNTER — Other Ambulatory Visit: Payer: Self-pay | Admitting: Gastroenterology

## 2020-07-07 ENCOUNTER — Encounter: Payer: Self-pay | Admitting: Family Medicine

## 2020-07-07 NOTE — Assessment & Plan Note (Signed)
Hyperlipidemia:Low fat diet discussed and encouraged.   Lipid Panel  Lab Results  Component Value Date   CHOL 187 04/18/2020   HDL 67 04/18/2020   LDLCALC 108 (H) 04/18/2020   TRIG 42 04/18/2020   CHOLHDL 2.8 04/18/2020  needs to lower fat intake

## 2020-07-07 NOTE — Assessment & Plan Note (Signed)
Ashley Armstrong is reminded of the importance of commitment to daily physical activity for 30 minutes or more, as able and the need to limit carbohydrate intake to 30 to 60 grams per meal to help with blood sugar control.   The need to take medication as prescribed, test blood sugar as directed, and to call between visits if there is a concern that blood sugar is uncontrolled is also discussed.   Ashley Armstrong is reminded of the importance of daily foot exam, annual eye examination, and good blood sugar, blood pressure and cholesterol control. Improving, managed by endo  Diabetic Labs Latest Ref Rng & Units 04/18/2020 03/20/2020 12/21/2019 09/07/2019 07/26/2019  HbA1c <5.7 % of total Hgb 8.5(H) 9.6(A) 9.8(A) 9.2(A) -  Microalbumin mg/dL 2.6 - - - 1.7  Micro/Creat Ratio <30 mcg/mg creat 17 - - - 12  Chol <200 mg/dL 187 - - - 154  HDL > OR = 50 mg/dL 67 - - - 59  Calc LDL mg/dL (calc) 108(H) - - - 77  Triglycerides <150 mg/dL 42 - - - 94  Creatinine 0.50 - 1.05 mg/dL 0.99 - - - 1.12(H)   BP/Weight 07/06/2020 06/27/2020 06/15/2020 03/20/2020 03/20/2020 03/28/6159 05/13/7105  Systolic BP 269 485 462 703 500 938 182  Diastolic BP 70 70 76 78 78 68 80  Wt. (Lbs) 130.04 131 136 133 133.8 131.2 134.6  BMI 19.77 19.92 20.68 20.22 20.34 19.95 20.47   Foot/eye exam completion dates Latest Ref Rng & Units 10/26/2019 07/31/2018  Eye Exam No Retinopathy - -  Foot Form Completion - Done Done

## 2020-07-07 NOTE — Assessment & Plan Note (Signed)
Improving with time following loss of partner and pet in 2020-being treated by new Psychiatrist, using spirituality  As a main coping tool

## 2020-07-11 DIAGNOSIS — F4323 Adjustment disorder with mixed anxiety and depressed mood: Secondary | ICD-10-CM | POA: Diagnosis not present

## 2020-07-19 ENCOUNTER — Ambulatory Visit (INDEPENDENT_AMBULATORY_CARE_PROVIDER_SITE_OTHER): Payer: PPO | Admitting: Podiatry

## 2020-07-19 ENCOUNTER — Encounter: Payer: Self-pay | Admitting: Podiatry

## 2020-07-19 ENCOUNTER — Other Ambulatory Visit: Payer: Self-pay

## 2020-07-19 DIAGNOSIS — M129 Arthropathy, unspecified: Secondary | ICD-10-CM

## 2020-07-19 DIAGNOSIS — M79675 Pain in left toe(s): Secondary | ICD-10-CM

## 2020-07-19 DIAGNOSIS — E1142 Type 2 diabetes mellitus with diabetic polyneuropathy: Secondary | ICD-10-CM

## 2020-07-19 DIAGNOSIS — M79674 Pain in right toe(s): Secondary | ICD-10-CM | POA: Diagnosis not present

## 2020-07-19 DIAGNOSIS — B351 Tinea unguium: Secondary | ICD-10-CM | POA: Diagnosis not present

## 2020-07-19 NOTE — Progress Notes (Signed)
This patient returns to my office for at risk foot care.  This patient requires this care by a professional since this patient will be at risk due to having DM with neuropathy.  This patient is unable to cut nails herself since the patient cannot reach her nails.These nails are painful walking and wearing shoes.  This patient presents for at risk foot care today.  General Appearance  Alert, conversant and in no acute stress.  Vascular  Dorsalis pedis and posterior tibial  pulses are palpable  bilaterally.  Capillary return is within normal limits  bilaterally. Temperature is within normal limits  bilaterally.  Neurologic  Senn-Weinstein monofilament wire test absent   bilaterally. Muscle power within normal limits bilaterally.  Nails Thick disfigured discolored nails with subungual debris  from hallux to fifth toes bilaterally. No evidence of bacterial infection or drainage bilaterally.  Orthopedic  No limitations of motion  feet .  No crepitus or effusions noted.  No bony pathology or digital deformities noted.  Skin  normotropic skin with no porokeratosis noted bilaterally.  No signs of infections or ulcers noted.     Onychomycosis  Pain in right toes  Pain in left toes  Consent was obtained for treatment procedures.   Mechanical debridement of nails 1-5  bilaterally performed with a nail nipper.  Filed with dremel without incident.    Return office visit   4 months                   Told patient to return for periodic foot care and evaluation due to potential at risk complications.   Gardiner Barefoot DPM

## 2020-08-15 ENCOUNTER — Encounter: Payer: Self-pay | Admitting: Gastroenterology

## 2020-08-19 ENCOUNTER — Telehealth: Payer: Self-pay

## 2020-08-19 NOTE — Telephone Encounter (Signed)
I contacted the patient today to verify she was able to be seen at [facility]- left her a VM requesting she call back to let us know if she has been seen so that I can close out the referral

## 2020-08-25 ENCOUNTER — Other Ambulatory Visit: Payer: Self-pay | Admitting: Endocrinology

## 2020-09-08 ENCOUNTER — Ambulatory Visit: Payer: PPO | Admitting: Gastroenterology

## 2020-09-21 ENCOUNTER — Other Ambulatory Visit: Payer: Self-pay | Admitting: Endocrinology

## 2020-09-28 ENCOUNTER — Ambulatory Visit: Payer: PPO | Admitting: Endocrinology

## 2020-09-29 ENCOUNTER — Encounter: Payer: Self-pay | Admitting: *Deleted

## 2020-09-29 ENCOUNTER — Other Ambulatory Visit: Payer: Self-pay

## 2020-09-29 ENCOUNTER — Ambulatory Visit (INDEPENDENT_AMBULATORY_CARE_PROVIDER_SITE_OTHER): Payer: PPO | Admitting: Gastroenterology

## 2020-09-29 ENCOUNTER — Encounter: Payer: Self-pay | Admitting: Gastroenterology

## 2020-09-29 VITALS — BP 118/74 | HR 80 | Temp 96.9°F | Ht 68.0 in | Wt 132.6 lb

## 2020-09-29 DIAGNOSIS — K921 Melena: Secondary | ICD-10-CM | POA: Insufficient documentation

## 2020-09-29 DIAGNOSIS — R1012 Left upper quadrant pain: Secondary | ICD-10-CM | POA: Diagnosis not present

## 2020-09-29 DIAGNOSIS — K625 Hemorrhage of anus and rectum: Secondary | ICD-10-CM

## 2020-09-29 DIAGNOSIS — R197 Diarrhea, unspecified: Secondary | ICD-10-CM | POA: Diagnosis not present

## 2020-09-29 HISTORY — DX: Hemorrhage of anus and rectum: K62.5

## 2020-09-29 NOTE — Progress Notes (Signed)
Primary Care Physician: Fayrene Helper, MD  Primary Gastroenterologist:  Elon Alas. Abbey Chatters, DO   Chief Complaint  Patient presents with  . Diarrhea    started 09/20/20  . Abdominal Pain    left upper  . Nausea    HPI: Ashley Armstrong is a 54 y.o. female here for diarrhea, left upper quadrant pain.  She was last seen in April 2021.  She has a history of constipation, GERD, gastroparesis, left lower quadrant pain.  Last colonoscopy in 2017, she had 2 hyperplastic polyps removed. Next colonoscopy due in 10 years. EGD in 2014 with pyloric channel stenosis s/p dilation, gastritis.  CT renal stone study July 2019 unremarkable.  Right upper quadrant ultrasound February 2019 showed 2 mm gallbladder polyp, unchanged from 2016.  No further work-up needed.  Complete abdominal ultrasound February 2018, small gallbladder polyp, otherwise unremarkable.  Gastric emptying study 2014 with 100% retention at 60 minutes, 75% retention at 120 minutes.  This was after pyloric channel stenosis dilated.  First of the month started having watery stools.  Then she passed a large solid BM with force, followed with some bright red blood per rectum.  Has had persistent loose stools until yesterday morning, had one loose stool. BM "all day long" prior to last few days. Fecal urgency. Abdominal cramping at times. Did not take antidiarrhea medication. Held miralax. Bloating since diarrhea started.  At times the stool looks somewhat black.  For couple of months feels pressure under left ribs, comes and goes.  Feels like something is in there pushing under her ribs. Happens frequently. Having to eat small meals with gastroparesis. Nausea with recent diarrhea episodes. No vomiting. No heartburn.      Current Outpatient Medications  Medication Sig Dispense Refill  . Blood Glucose Monitoring Suppl (FREESTYLE PRECISION NEO SYSTEM) w/Device KIT 1 kit by Does not apply route 2 (two) times daily. 1 kit 0  .  Continuous Blood Gluc Receiver (FREESTYLE LIBRE 14 DAY READER) DEVI Use Freestyle Libre reader to monitor blood sugars continuously. 1 each 0  . Continuous Blood Gluc Receiver (FREESTYLE LIBRE 2 READER) DEVI 1 each by Does not apply route See admin instructions. Use Freestyle libre 2 reader to monitor blood sugars continuously. 1 each 0  . Continuous Blood Gluc Sensor (FREESTYLE LIBRE 2 SENSOR) MISC Use to check blood sugars. Change every 14 days. 1 each 5  . diazepam (VALIUM) 10 MG tablet as needed.     . DUREZOL 0.05 % EMUL Apply 1 drop to eye daily at 8 pm.    . glucose blood (FREESTYLE PRECISION NEO TEST) test strip CHECKING BLOOD SUGAR TWICE DAILY. 100 strip 3  . insulin aspart (NOVOLOG FLEXPEN) 100 UNIT/ML FlexPen INJECT 8 TO 12 UNITS SUBCUTANEOUSLY BEFORE EACH MEAL AS DIRECTED. ADJUST PERSLIDING SCALE. MAXIMUM DOSE PER DAY IS 51 UNITS 45 mL 0  . insulin glargine (LANTUS SOLOSTAR) 100 UNIT/ML Solostar Pen Inject 68 Units into the skin daily. Inject 40 units under the skin in the morning and 28 units in the evening. 30 mL 0  . lisinopril (ZESTRIL) 2.5 MG tablet TAKE ONE (1) TABLET BY MOUTH EVERY DAY 90 tablet 1  . metoCLOPramide (REGLAN) 5 MG tablet TAKE ONE TO TWO TABLETS BY MOUTH EVERY SIX HOURS AS NEEDED FOR NAUSEAOR VOMITING 120 tablet 5  . montelukast (SINGULAIR) 10 MG tablet TAKE ONE TABLET (10MG TOTAL) BY MOUTH ATBEDTIME 90 tablet 1  . Omega-3 Fatty Acids (FISH OIL) 1000 MG CAPS  Take 1 capsule by mouth daily.    Marland Kitchen omeprazole (PRILOSEC) 40 MG capsule TAKE ONE CAPSULE BY MOUTH 30 MINUTES PRIOR TO BREAKFAST AND SUPPER 180 capsule 3  . ondansetron (ZOFRAN) 4 MG tablet TAKE ONE TABLET BY MOUTH EVERY EIGHT HOURS AS NEEDED FOR NAUSEA OR VOMITING 30 tablet 3  . ONETOUCH DELICA LANCETS 19J MISC USE FOUR TIMES A DAY AS DIRECTED. 100 each 1  . polyethylene glycol (MIRALAX / GLYCOLAX) 17 g packet Take 17 g by mouth daily as needed.    . pravastatin (PRAVACHOL) 80 MG tablet TAKE ONE TABLET (80MG  TOTAL) BY MOUTH EVERY EVENING 90 tablet 1  . SURE COMFORT PEN NEEDLES 30G X 8 MM MISC 4 boxes (88day supply) 4 packet 3  . vitamin B-12 (CYANOCOBALAMIN) 100 MCG tablet Take 1 tablet by mouth daily.    Marland Kitchen VITAMIN D PO Take 1 Dose by mouth daily.     No current facility-administered medications for this visit.    Allergies as of 09/29/2020  . (No Known Allergies)    ROS:  General: Negative for anorexia, weight loss, fever, chills, fatigue, weakness. ENT: Negative for hoarseness, difficulty swallowing , nasal congestion. CV: Negative for chest pain, angina, palpitations, dyspnea on exertion, peripheral edema.  Respiratory: Negative for dyspnea at rest, dyspnea on exertion, cough, sputum, wheezing.  GI: See history of present illness. GU:  Negative for dysuria, hematuria, urinary incontinence, urinary frequency, nocturnal urination.  Endo: Negative for unusual weight change.    Physical Examination:   BP 118/74   Pulse 80   Temp (!) 96.9 F (36.1 C) (Temporal)   Ht _0  (1.727 m)   Wt 132 lb 9.6 oz (60.1 kg)   LMP 07/26/2013   BMI 20.16 kg/m   General: Well-nourished, well-developed in no acute distress.  Eyes: No icterus. Mouth: masked Lungs: Clear to auscultation bilaterally.  Heart: Regular rate and rhythm, no murmurs rubs or gallops.  Abdomen: Bowel sounds are normal,   nondistended, no hepatosplenomegaly or masses, no abdominal bruits or hernia , no rebound or guarding. Tender in left upper abdomen. Fullness in epigastric region but no mass ?mild diastasis recti. Extremities: No lower extremity edema. No clubbing or deformities. Neuro: Alert and oriented x 4   Skin: Warm and dry, no jaundice.   Psych: Alert and cooperative, normal mood and affect.  Labs:  Lab Results  Component Value Date   HGBA1C 8.5 (H) 04/18/2020   Lab Results  Component Value Date   CREATININE 0.99 04/18/2020   BUN 18 04/18/2020   NA 142 04/18/2020   K 4.6 04/18/2020   CL 110 04/18/2020    CO2 26 04/18/2020   Lab Results  Component Value Date   ALT 21 04/18/2020   AST 19 04/18/2020   ALKPHOS 88 05/18/2018   BILITOT 0.4 04/18/2020       Imaging Studies: No results found.   Impression/plan  Pleasant 54 year old female with history of constipation, gastroparesis, GERD presenting for further evaluation of 4-week history of diarrhea.  Couple of episodes of rectal bleeding with forceful stool.  Over the last few days her diarrhea has improved, last now over 24 hours ago.  Diarrhea appears to be resolving.  If diarrhea returns or persists she will let us know we will do stool studies.  Left upper quadrant pain for 2 months.  Feels fullness with pressure pushing into the lower ribs.  On exam she has fullness in the epigastrium.  No discrete mass.  Nausea but  no vomiting.  Etiology unclear.  Overall her gastroparesis seems to be well managed, less likely contributing to the symptoms.  Recommend CT abdomen pelvis with contrast.  Update labs.  Gastroparesis doing well multiple small meals daily.  Takes Reglan and Zofran as needed only.  GERD well controlled

## 2020-09-29 NOTE — Patient Instructions (Signed)
1. Please have your labs done today. 2. We will schedule you for a CT scan.  3. If your diarrhea returns, let me know and we will do stool tests for infection.

## 2020-10-02 NOTE — Progress Notes (Signed)
Cc'ed to pcp °

## 2020-10-09 ENCOUNTER — Other Ambulatory Visit: Payer: PPO

## 2020-10-17 ENCOUNTER — Ambulatory Visit: Payer: PPO | Admitting: Endocrinology

## 2020-10-18 ENCOUNTER — Other Ambulatory Visit: Payer: Self-pay

## 2020-10-18 ENCOUNTER — Ambulatory Visit (INDEPENDENT_AMBULATORY_CARE_PROVIDER_SITE_OTHER): Payer: PPO | Admitting: Internal Medicine

## 2020-10-18 ENCOUNTER — Encounter: Payer: Self-pay | Admitting: Internal Medicine

## 2020-10-18 VITALS — BP 131/86 | HR 81 | Temp 97.4°F | Resp 18

## 2020-10-18 DIAGNOSIS — J029 Acute pharyngitis, unspecified: Secondary | ICD-10-CM

## 2020-10-18 DIAGNOSIS — Z20822 Contact with and (suspected) exposure to covid-19: Secondary | ICD-10-CM | POA: Insufficient documentation

## 2020-10-18 LAB — POCT RAPID STREP A (OFFICE): Rapid Strep A Screen: NEGATIVE

## 2020-10-18 NOTE — Assessment & Plan Note (Signed)
Scheduled for COVID testing Advised to self-quarantine for now Tylenol PRN for fever/myalgias Nasal saline spray for nasal congestion

## 2020-10-18 NOTE — Progress Notes (Signed)
Established Patient Office Visit  Subjective:  Patient ID: Ashley Armstrong, female    DOB: [birth year]  Age: 54 y.o. MRN: [id number]  CC:  Chief Complaint  Patient presents with  . Sore Throat    Pt has had sore throat dry cough and drainage for 2 weeks has not had covid test     HPI Ashley Armstrong is a 54 year old female with PMH of type 2 DM, HLD, depression and anxiety who presents for evaluation of sore throat and cough.  She reports dry cough for about 3 weeks. She had severe throat pain about 3 weeks ago, which resolved in about 5 days. But the cough has persisted. She also c/o nasal congestion and sinus pain/pressure. She has a family member who works as Catering manager. He has been COVID negative, but she is unsure why he is advised to stay at home. She denies any fever, chills, change in smell or taste sensation. She has had 2 doses of COVID vaccination.  Past Medical History:  Diagnosis Date  . Anxiety   . Dyslipidemia   . Gastroparesis due to DM ([place]) OCT 2014   75% AT 2 HRS, GLU >   . H/O eye surgery 01/2018  . Headache(784.0)   . IDDM (insulin dependent diabetes mellitus)   . Neuropathy   . Nicotine addiction   . TIA (transient ischemic attack)    Dr. Merlene Laughter    Past Surgical History:  Procedure Laterality Date  . Cataract surgery Bilateral 01/20/2017, 02/03/2017  . COLONOSCOPY N/A 05/09/2016   Dr. Oneida Alar: 2 hyperplastic polyps removed.  Next colonoscopy in 2027  . ESOPHAGOGASTRODUODENOSCOPY N/A 08/31/2013   SLF: 1. Earky satiety nausea may be due to Gastroparesis/pyloric channel stenosis. 2. small hiatal hernia 3. Moderate erosive gastritis.   Marland Kitchen EYE SURGERY  01/2018  . laser surgery bilateral eye      Family History  Problem Relation Age of Onset  . Hypertension Mother   . Diabetes Mother   . Hyperlipidemia Mother   . Rosacea Mother   . Hypertension Father   . Diabetes Brother   . Breast cancer Maternal Aunt   . Colon cancer Neg Hx     Social History    Socioeconomic History  . Marital status: Single    Spouse name: Not on file  . Number of children: Not on file  . Years of education: Not on file  . Highest education level: Not on file  Occupational History  . Occupation: disabled  Tobacco Use  . Smoking status: Former Smoker    Packs/day: 1.50    Years: 24.00    Pack years: 36.00    Types: Cigarettes    Quit date: 08/24/2011    Years since quitting: 9.1  . Smokeless tobacco: Never Used  Vaping Use  . Vaping Use: Never used  Substance and Sexual Activity  . Alcohol use: No  . Drug use: No  . Sexual activity: Not Currently    Birth control/protection: None  Other Topics Concern  . Not on file  Social History Narrative   Disabled, lives with a room mate   Social Determinants of Health   Financial Resource Strain: Medium Risk  . Difficulty of Paying Living Expenses: Somewhat hard  Food Insecurity: No Food Insecurity  . Worried About Charity fundraiser in the Last Year: Never true  . Ran Out of Food in the Last Year: Never true  Transportation Needs: No Transportation Needs  . Lack of Transportation (  Medical): No  . Lack of Transportation (Non-Medical): No  Physical Activity: Insufficiently Active  . Days of Exercise per Week: 3 days  . Minutes of Exercise per Session: 20 min  Stress: Stress Concern Present  . Feeling of Stress : Rather much  Social Connections: Moderately Isolated  . Frequency of Communication with Friends and Family: More than three times a week  . Frequency of Social Gatherings with Friends and Family: More than three times a week  . Attends Religious Services: More than 4 times per year  . Active Member of Clubs or Organizations: No  . Attends Archivist Meetings: Never  . Marital Status: Never married  Intimate Partner Violence:   . Fear of Current or Ex-Partner: Not on file  . Emotionally Abused: Not on file  . Physically Abused: Not on file  . Sexually Abused: Not on file     Outpatient Medications Prior to Visit  Medication Sig Dispense Refill  . Blood Glucose Monitoring Suppl (FREESTYLE PRECISION NEO SYSTEM) w/Device KIT 1 kit by Does not apply route 2 (two) times daily. 1 kit 0  . Continuous Blood Gluc Receiver (FREESTYLE LIBRE 14 DAY READER) DEVI Use Freestyle Libre reader to monitor blood sugars continuously. 1 each 0  . Continuous Blood Gluc Receiver (FREESTYLE LIBRE 2 READER) DEVI 1 each by Does not apply route See admin instructions. Use Freestyle libre 2 reader to monitor blood sugars continuously. 1 each 0  . Continuous Blood Gluc Sensor (FREESTYLE LIBRE 2 SENSOR) MISC Use to check blood sugars. Change every 14 days. 1 each 5  . diazepam (VALIUM) 10 MG tablet as needed.     . DUREZOL 0.05 % EMUL Apply 1 drop to eye daily at 8 pm.    . glucose blood (FREESTYLE PRECISION NEO TEST) test strip CHECKING BLOOD SUGAR TWICE DAILY. 100 strip 3  . insulin aspart (NOVOLOG FLEXPEN) 100 UNIT/ML FlexPen INJECT 8 TO 12 UNITS SUBCUTANEOUSLY BEFORE EACH MEAL AS DIRECTED. ADJUST PERSLIDING SCALE. MAXIMUM DOSE PER DAY IS 51 UNITS 45 mL 0  . insulin glargine (LANTUS SOLOSTAR) 100 UNIT/ML Solostar Pen Inject 68 Units into the skin daily. Inject 40 units under the skin in the morning and 28 units in the evening. 30 mL 0  . lisinopril (ZESTRIL) 2.5 MG tablet TAKE ONE (1) TABLET BY MOUTH EVERY DAY 90 tablet 1  . metoCLOPramide (REGLAN) 5 MG tablet TAKE ONE TO TWO TABLETS BY MOUTH EVERY SIX HOURS AS NEEDED FOR NAUSEAOR VOMITING 120 tablet 5  . montelukast (SINGULAIR) 10 MG tablet TAKE ONE TABLET (10MG TOTAL) BY MOUTH ATBEDTIME 90 tablet 1  . Omega-3 Fatty Acids (FISH OIL) 1000 MG CAPS Take 1 capsule by mouth daily.    Marland Kitchen omeprazole (PRILOSEC) 40 MG capsule TAKE ONE CAPSULE BY MOUTH 30 MINUTES PRIOR TO BREAKFAST AND SUPPER 180 capsule 3  . ondansetron (ZOFRAN) 4 MG tablet TAKE ONE TABLET BY MOUTH EVERY EIGHT HOURS AS NEEDED FOR NAUSEA OR VOMITING 30 tablet 3  . ONETOUCH DELICA  LANCETS 65V MISC USE FOUR TIMES A DAY AS DIRECTED. 100 each 1  . polyethylene glycol (MIRALAX / GLYCOLAX) 17 g packet Take 17 g by mouth daily as needed.    . pravastatin (PRAVACHOL) 80 MG tablet TAKE ONE TABLET (80MG TOTAL) BY MOUTH EVERY EVENING 90 tablet 1  . SURE COMFORT PEN NEEDLES 30G X 8 MM MISC 4 boxes (88day supply) 4 packet 3  . vitamin B-12 (CYANOCOBALAMIN) 100 MCG tablet Take 1 tablet by mouth  daily.    Marland Kitchen VITAMIN D PO Take 1 Dose by mouth daily.     No facility-administered medications prior to visit.    No Known Allergies  ROS Review of Systems  Constitutional: Negative for chills and fever.  HENT: Positive for congestion, sinus pressure, sinus pain and sore throat. Negative for ear discharge, ear pain and postnasal drip.   Eyes: Negative for pain and discharge.  Respiratory: Positive for cough. Negative for shortness of breath.   Cardiovascular: Negative for chest pain and palpitations.  Gastrointestinal: Negative for abdominal pain, constipation, diarrhea, nausea and vomiting.  Endocrine: Negative for polydipsia and polyuria.  Genitourinary: Negative for dysuria and hematuria.  Musculoskeletal: Negative for neck pain and neck stiffness.  Skin: Negative for rash.      Objective:    Physical Exam Vitals reviewed.  Constitutional:      General: She is not in acute distress.    Appearance: She is not diaphoretic.  HENT:     Head: Normocephalic and atraumatic.     Nose: Congestion present.     Mouth/Throat:     Mouth: Mucous membranes are moist.     Pharynx: No oropharyngeal exudate or posterior oropharyngeal erythema.     Tonsils: No tonsillar exudate.  Eyes:     General: No scleral icterus.    Extraocular Movements: Extraocular movements intact.     Pupils: Pupils are equal, round, and reactive to light.  Cardiovascular:     Rate and Rhythm: Normal rate and regular rhythm.     Heart sounds: No murmur heard.   Pulmonary:     Breath sounds: Normal breath  sounds. No wheezing or rales.  Musculoskeletal:     Cervical back: Neck supple. No tenderness.     Right lower leg: No edema.     Left lower leg: No edema.  Skin:    General: Skin is warm.     Findings: No rash.  Neurological:     General: No focal deficit present.     Mental Status: She is alert and oriented to person, place, and time.  Psychiatric:        Mood and Affect: Mood normal.        Behavior: Behavior normal.     BP 131/86 (BP Location: Right Arm, Patient Position: Sitting, Cuff Size: Normal)   Pulse 81   Temp (!) 97.4 F (36.3 C) (Oral)   Resp 18   LMP 07/26/2013   SpO2 95%  Wt Readings from Last 3 Encounters:  09/29/20 132 lb 9.6 oz (60.1 kg)  07/06/20 130 lb 0.6 oz (59 kg)  06/27/20 131 lb (59.4 kg)     Health Maintenance Due  Topic Date Due  . Hepatitis C Screening  Never done  . HEMOGLOBIN A1C  10/18/2020    There are no preventive care reminders to display for this patient.  Lab Results  Component Value Date   TSH 1.53 07/26/2019   Lab Results  Component Value Date   WBC 6.0 04/18/2020   HGB 11.7 04/18/2020   HCT 35.6 04/18/2020   MCV 86.6 04/18/2020   PLT 267 04/18/2020   Lab Results  Component Value Date   NA 142 04/18/2020   K 4.6 04/18/2020   CO2 26 04/18/2020   GLUCOSE 89 04/18/2020   BUN 18 04/18/2020   CREATININE 0.99 04/18/2020   BILITOT 0.4 04/18/2020   ALKPHOS 88 05/18/2018   AST 19 04/18/2020   ALT 21 04/18/2020   PROT 6.1 04/18/2020  ALBUMIN 3.8 05/18/2018   CALCIUM 9.3 04/18/2020   ANIONGAP 7 05/18/2018   Lab Results  Component Value Date   CHOL 187 04/18/2020   Lab Results  Component Value Date   HDL 67 04/18/2020   Lab Results  Component Value Date   LDLCALC 108 (H) 04/18/2020   Lab Results  Component Value Date   TRIG 42 04/18/2020   Lab Results  Component Value Date   CHOLHDL 2.8 04/18/2020   Lab Results  Component Value Date   HGBA1C 8.5 (H) 04/18/2020      Assessment & Plan:   Problem  List Items Addressed This Visit      Other   Sore throat - Primary    Strep test negative No oral exudates noted If COVID test negative, will prescribe Antibiotic for sinusitis      Relevant Orders   POCT rapid strep A (Completed)   Suspected COVID-19 virus infection    Scheduled for COVID testing Advised to self-quarantine for now Tylenol PRN for fever/myalgias Nasal saline spray for nasal congestion         No orders of the defined types were placed in this encounter.   Follow-up: Return if symptoms worsen or fail to improve.    Lindell Spar, MD

## 2020-10-18 NOTE — Assessment & Plan Note (Signed)
Strep test negative No oral exudates noted If COVID test negative, will prescribe Antibiotic for sinusitis

## 2020-10-18 NOTE — Patient Instructions (Addendum)
You are being scheduled to get tested for COVID.  Please take Tylenol up to three times in a day as needed for fever/myalgias.  Okay to take Robitussin for cough up to three times in a day.  Okay to use Nasal saline spray for nasal congestion.  Depending on the COVID test, we will decide whether you would require antibiotics.

## 2020-10-19 ENCOUNTER — Other Ambulatory Visit: Payer: PPO

## 2020-10-19 ENCOUNTER — Other Ambulatory Visit: Payer: Self-pay

## 2020-10-19 DIAGNOSIS — Z20822 Contact with and (suspected) exposure to covid-19: Secondary | ICD-10-CM | POA: Diagnosis not present

## 2020-10-21 LAB — NOVEL CORONAVIRUS, NAA: SARS-CoV-2, NAA: NOT DETECTED

## 2020-10-21 LAB — SARS-COV-2, NAA 2 DAY TAT

## 2020-10-21 LAB — SPECIMEN STATUS REPORT

## 2020-10-24 ENCOUNTER — Telehealth: Payer: Self-pay

## 2020-10-24 ENCOUNTER — Telehealth: Payer: Self-pay | Admitting: Internal Medicine

## 2020-10-24 ENCOUNTER — Other Ambulatory Visit: Payer: Self-pay | Admitting: Family Medicine

## 2020-10-24 DIAGNOSIS — R1012 Left upper quadrant pain: Secondary | ICD-10-CM | POA: Diagnosis not present

## 2020-10-24 DIAGNOSIS — K921 Melena: Secondary | ICD-10-CM | POA: Diagnosis not present

## 2020-10-24 DIAGNOSIS — Z794 Long term (current) use of insulin: Secondary | ICD-10-CM | POA: Diagnosis not present

## 2020-10-24 DIAGNOSIS — E785 Hyperlipidemia, unspecified: Secondary | ICD-10-CM | POA: Diagnosis not present

## 2020-10-24 DIAGNOSIS — K625 Hemorrhage of anus and rectum: Secondary | ICD-10-CM | POA: Diagnosis not present

## 2020-10-24 DIAGNOSIS — E559 Vitamin D deficiency, unspecified: Secondary | ICD-10-CM | POA: Diagnosis not present

## 2020-10-24 DIAGNOSIS — R197 Diarrhea, unspecified: Secondary | ICD-10-CM | POA: Diagnosis not present

## 2020-10-24 DIAGNOSIS — E1140 Type 2 diabetes mellitus with diabetic neuropathy, unspecified: Secondary | ICD-10-CM | POA: Diagnosis not present

## 2020-10-24 MED ORDER — BENZONATATE 100 MG PO CAPS: 100.0000 mg | ORAL_CAPSULE | Freq: Two times a day (BID) | ORAL | 0 refills | Status: DC | PRN

## 2020-10-24 NOTE — Telephone Encounter (Signed)
Pt aware.

## 2020-10-24 NOTE — Telephone Encounter (Signed)
I have sent tessalon perle and she can take otc claritin or zyrtec also for the drainage

## 2020-10-24 NOTE — Telephone Encounter (Signed)
States she seen Dr Posey Pronto for a sore throat and dry cough that had been going on since Nov 10. He did strep test and it was neg as well as covid was neg. She said she didn't get any treatment and she is still having a bad dry cough at night and mucus draining down her throat. I offered her an appt but she said she has one with you next week and she will just wait since its been this long but I told her I would make you aware incase you wanted to recommend anything. Please advise

## 2020-10-24 NOTE — Telephone Encounter (Signed)
Lab orders faxed to [facility]

## 2020-10-24 NOTE — Telephone Encounter (Signed)
noted 

## 2020-10-24 NOTE — Addendum Note (Signed)
Addended by: Claudina Lick on: 10/24/2020 08:31 AM   Modules accepted: Orders

## 2020-10-24 NOTE — Telephone Encounter (Signed)
[facility] called back, she needed the lab orders changed to [facility] instead of [facility]. I have gone in and changed the lab and refaxed the orders. Routing to [facility] for [facility].

## 2020-10-24 NOTE — Telephone Encounter (Signed)
Pt is at [facility] called to say they needed lab orders on CBC, Lipase faxed to them. Fax # [phone]

## 2020-10-25 ENCOUNTER — Ambulatory Visit: Payer: PPO | Admitting: Family Medicine

## 2020-10-25 LAB — LIPID PANEL
Chol/HDL Ratio: 2.8 ratio (ref 0.0–4.4)
Cholesterol, Total: 182 mg/dL (ref 100–199)
HDL: 66 mg/dL (ref >39)
LDL Chol Calc (NIH): 98 mg/dL (ref 0–99)
Triglycerides: 101 mg/dL (ref 0–149)
VLDL Cholesterol Cal: 18 mg/dL (ref 5–40)

## 2020-10-25 LAB — CBC WITH DIFFERENTIAL/PLATELET
Basophils Absolute: 0.1 10*3/uL (ref 0.0–0.2)
Basos: 1 %
EOS (ABSOLUTE): 0.2 10*3/uL (ref 0.0–0.4)
Eos: 3 %
Hematocrit: 37.1 % (ref 34.0–46.6)
Hemoglobin: 12.3 g/dL (ref 11.1–15.9)
Immature Grans (Abs): 0 10*3/uL (ref 0.0–0.1)
Immature Granulocytes: 0 %
Lymphocytes Absolute: 1.8 10*3/uL (ref 0.7–3.1)
Lymphs: 26 %
MCH: 29.3 pg (ref 26.6–33.0)
MCHC: 33.2 g/dL (ref 31.5–35.7)
MCV: 88 fL (ref 79–97)
Monocytes Absolute: 0.5 10*3/uL (ref 0.1–0.9)
Monocytes: 8 %
Neutrophils Absolute: 4.4 10*3/uL (ref 1.4–7.0)
Neutrophils: 62 %
Platelets: 275 10*3/uL (ref 150–450)
RBC: 4.2 x10E6/uL (ref 3.77–5.28)
RDW: 12.4 % (ref 11.7–15.4)
WBC: 7 10*3/uL (ref 3.4–10.8)

## 2020-10-25 LAB — CMP14+EGFR
ALT: 21 IU/L (ref 0–32)
AST: 20 IU/L (ref 0–40)
Albumin/Globulin Ratio: 1.6 (ref 1.2–2.2)
Albumin: 4.2 g/dL (ref 3.8–4.9)
Alkaline Phosphatase: 95 IU/L (ref 44–121)
BUN/Creatinine Ratio: 17 (ref 9–23)
BUN: 17 mg/dL (ref 6–24)
Bilirubin Total: 0.4 mg/dL (ref 0.0–1.2)
CO2: 22 mmol/L (ref 20–29)
Calcium: 9.5 mg/dL (ref 8.7–10.2)
Chloride: 103 mmol/L (ref 96–106)
Creatinine, Ser: 0.98 mg/dL (ref 0.57–1.00)
GFR calc Af Amer: 76 mL/min/{1.73_m2} (ref >59)
GFR calc non Af Amer: 66 mL/min/{1.73_m2} (ref >59)
Globulin, Total: 2.6 g/dL (ref 1.5–4.5)
Glucose: 179 mg/dL — ABNORMAL HIGH (ref 65–99)
Potassium: 4.8 mmol/L (ref 3.5–5.2)
Sodium: 139 mmol/L (ref 134–144)
Total Protein: 6.8 g/dL (ref 6.0–8.5)

## 2020-10-25 LAB — VITAMIN D 25 HYDROXY (VIT D DEFICIENCY, FRACTURES): Vit D, 25-Hydroxy: 64 ng/mL (ref 30.0–100.0)

## 2020-10-25 LAB — TSH: TSH: 1.46 u[IU]/mL (ref 0.450–4.500)

## 2020-10-25 LAB — LIPASE: Lipase: 21 U/L (ref 14–72)

## 2020-10-27 ENCOUNTER — Ambulatory Visit (HOSPITAL_COMMUNITY)
Admission: RE | Admit: 2020-10-27 | Discharge: 2020-10-27 | Disposition: A | Discharge status: Home / Self Care | Payer: PPO | Source: Ambulatory Visit | Attending: Gastroenterology | Admitting: Gastroenterology

## 2020-10-27 ENCOUNTER — Other Ambulatory Visit: Payer: Self-pay

## 2020-10-27 DIAGNOSIS — R197 Diarrhea, unspecified: Secondary | ICD-10-CM | POA: Diagnosis not present

## 2020-10-27 DIAGNOSIS — K921 Melena: Secondary | ICD-10-CM | POA: Diagnosis not present

## 2020-10-27 DIAGNOSIS — R109 Unspecified abdominal pain: Secondary | ICD-10-CM | POA: Diagnosis not present

## 2020-10-27 MED ORDER — IOHEXOL 300 MG/ML  SOLN
75.0000 mL | Freq: Once | INTRAMUSCULAR | Status: AC | PRN
  Administered 2020-10-27: 16:00:00 75 mL via INTRAVENOUS

## 2020-10-31 ENCOUNTER — Other Ambulatory Visit: Payer: Self-pay

## 2020-10-31 ENCOUNTER — Ambulatory Visit (INDEPENDENT_AMBULATORY_CARE_PROVIDER_SITE_OTHER): Payer: PPO | Admitting: Family Medicine

## 2020-10-31 ENCOUNTER — Encounter: Payer: Self-pay | Admitting: Family Medicine

## 2020-10-31 VITALS — BP 125/77 | HR 82 | Resp 16 | Ht 68.0 in | Wt 129.1 lb

## 2020-10-31 DIAGNOSIS — Z1231 Encounter for screening mammogram for malignant neoplasm of breast: Secondary | ICD-10-CM

## 2020-10-31 DIAGNOSIS — Z1159 Encounter for screening for other viral diseases: Secondary | ICD-10-CM

## 2020-10-31 DIAGNOSIS — Z0000 Encounter for general adult medical examination without abnormal findings: Secondary | ICD-10-CM

## 2020-10-31 NOTE — Progress Notes (Signed)
    Ashley Armstrong     MRN: [id number]      DOB: 06/11/1966  HPI: Patient is in for annual physical exam. C/o uncontrolled depressions and anxiety, being treated by Psych, not suicidal or homicidal Recent labs,  are reviewed. Immunization is reviewed ,   PE: BP 125/77   Pulse 82   Resp 16   Ht 5\' 8"  (1.727 m)   Wt 129 lb 1.3 oz (58.6 kg)   LMP 07/26/2013   SpO2 96%   BMI 19.63 kg/m   BP 125/77   Pulse 82   Resp 16   Ht 5\' 8"  (1.727 m)   Wt 129 lb 1.3 oz (58.6 kg)   LMP 07/26/2013   SpO2 96%   BMI 19.63 kg/m   Pleasant  female, alert and oriented x 3, in no cardio-pulmonary distress. Afebrile. HEENT No facial trauma or asymetry. Sinuses non tender.  Extra occullar muscles intact.. External ears normal, . Neck: supple, no adenopathy,JVD or thyromegaly.No bruits.  Chest: Clear to ascultation bilaterally.No crackles or wheezes. Non tender to palpation  Breast:  Asymptomatic, not examined Cardiovascular system; Heart sounds normal,  S1 and  S2 ,no S3.  No murmur, or thrill. Apical beat not displaced Peripheral pulses normal.  Abdomen: Soft, non tender, no organomegaly or masses. No bruits. Bowel sounds normal. No guarding, tenderness or rebound.   GU: Asymptomatic , not examined.   Musculoskeletal exam: Full ROM of spine, hips , shoulders and knees. No deformity ,swelling or crepitus noted. No muscle wasting or atrophy.   Neurologic: Cranial nerves 2 to 12 intact. Power, tone ,sensation and reflexes normal throughout. No disturbance in gait. No tremor.  Skin: Intact, no ulceration, erythema , scaling or rash noted. Pigmentation normal throughout  Psych; Uncontrolled depression and anxiety. Judgement and concentration normal   Assessment & Plan:  Annual physical exam Annual exam as documented. Counseling done  re healthy lifestyle involving commitment to 150 minutes exercise per week, heart healthy diet, and attaining healthy weight.The  importance of adequate sleep also discussed. Regular seat belt use and home safety, is also discussed. Changes in health habits are decided on by the patient with goals and time frames  set for achieving them. Immunization and cancer screening needs are specifically addressed at this visit.

## 2020-10-31 NOTE — Patient Instructions (Addendum)
F/U in office with MD in 4.5 month , call if you need me before  Please schedule mammogram end February, morning apointment ( Pt will get booster in December)  I have sent  Message to your Psychiatrist  It is important that you exercise regularly at least 30 minutes 5 times a week. If you develop chest pain, have severe difficulty breathing, or feel very tired, stop exercising immediately and seek medical attention    Please add hepatitis screen to recent lab if possible , if not pt to have this today

## 2020-11-01 ENCOUNTER — Other Ambulatory Visit: Payer: Self-pay

## 2020-11-01 ENCOUNTER — Ambulatory Visit (INDEPENDENT_AMBULATORY_CARE_PROVIDER_SITE_OTHER): Payer: PPO | Admitting: Endocrinology

## 2020-11-01 ENCOUNTER — Encounter: Payer: Self-pay | Admitting: Endocrinology

## 2020-11-01 VITALS — BP 140/78 | HR 78 | Ht 68.0 in | Wt 131.5 lb

## 2020-11-01 DIAGNOSIS — K3184 Gastroparesis: Secondary | ICD-10-CM

## 2020-11-01 DIAGNOSIS — E1043 Type 1 diabetes mellitus with diabetic autonomic (poly)neuropathy: Secondary | ICD-10-CM | POA: Diagnosis not present

## 2020-11-01 DIAGNOSIS — E1065 Type 1 diabetes mellitus with hyperglycemia: Secondary | ICD-10-CM | POA: Diagnosis not present

## 2020-11-01 DIAGNOSIS — I700 Atherosclerosis of aorta: Secondary | ICD-10-CM | POA: Diagnosis not present

## 2020-11-01 LAB — POCT GLYCOSYLATED HEMOGLOBIN (HGB A1C): Hemoglobin A1C: 8.5 % — AB (ref 4.0–5.6)

## 2020-11-01 NOTE — Patient Instructions (Signed)
Take 1 pill Reglan before each meal and extra as needed

## 2020-11-01 NOTE — Progress Notes (Signed)
Patient ID: Ashley Armstrong, female   DOB: December 11, 1965, 54 y.o.   MRN: [id number]           Reason for Appointment: Follow-up for Type 1 Diabetes  Referring physician: Dr. [name]   History of Present Illness:          Date of diagnosis of type 1 diabetes mellitus: Age 57       Background history:  She has been on insulin practically since her diagnosis was made.  Initially she had symptoms of weight loss and blurred vision For several years has been on Lantus and NovoLog but usually has had poor control with A1c mostly between 8-10%  Recent history:     INSULIN regimen is:  Lantus 40 units a.m., 28 units p.m .  NovoLog at mealtimes mostly 8-14 units    Current management, blood sugar patterns and problems identified:  Her A1c is 8.5     She was previously having better blood sugar control about 4 months ago  However her blood sugars are getting out of control and quite inconsistent from day-to-day  With reducing her evening Lantus in the last visit she is not having tendency to low normal readings in the mornings as much  Again generally blood sugars are higher late afternoon and early evening but not consistently  She thinks that her blood sugars may sometimes tend to be low normal right after eating and then start going up a couple of hours later  She does have gastroparesis  She does adjust her mealtime dose based on how much she is eating but may still have significant high blood sugars especially rebounding from low normal readings  She feels that her diet is higher in fat with fried food, some snacks like chips and that she is now wanting to change this  Is again reluctant to consider insulin pump and prefers to continue injections           Meal times are:  Breakfast is 7 AM at Lunch:  PM dinner:3 PM  Typical meal intake: Breakfast is sometimes high fat but otherwise eggs and toast.  Lunch usually a mixed meal and evening meal may be a sandwich with  bland food.  Snacks will be cheese and crackers Usually avoiding drinks with sugar            Exercise: None    Glucose monitoring:  done 4 times a day         Glucometer:  Freestyle libre.       Blood Glucose readings by time of day and averages from download   CONTINUOUS GLUCOSE MONITORING RECORD INTERPRETATION    Dates of Recording: Last 2 weeks  Sensor description: Libre 2   Glycemic patterns summary: Her blood sugars are highly variable at all times with periods of hyperglycemia randomly at all different times including overnight.  More significantly hyperglycemia is preceded by near normal or low sugars  Time in range is about the same as the last time but morning readings are relatively higher  HIGHEST blood sugar on an average is around midnight with no consistent pattern but recently has more significant hyperglycemia in the mid to late afternoons  Lowest blood sugars are on an average early morning  Generally overnight blood sugars are high but usually improving by early morning including low blood sugars on 12/12 early morning  Postprandial readings can be high to a variable extent after breakfast, intermittently high after lunch and dinner with no  consistent pattern  Hypoglycemia overall minimal  Results statistics:  CGM use % of time  95  2-week average/GV 200/33  Time in range        41% was 43  % Time Above 180  34  % Time above 250  24  % Time Below 70 1     PRE-MEAL Fasting Lunch Dinner Bedtime Overall  Glucose range:       Averages:  173  199  196   200   POST-MEAL PC Breakfast PC Lunch PC Dinner  Glucose range:     Averages:  216  199  233   Previously:   CGM use % of time  97  Average and SD 195  Time in range    43    %  % Time Above 180  35  % Time above 250  21  % Time Below target 1    PRE-MEAL Fasting Lunch Dinner Bedtime Overall  Glucose range:       Mean/median:  144  202  190  202  195   POST-MEAL PC Breakfast PC Lunch PC  Dinner  Glucose range:     Mean/median:  200  223  212    Glycemic patterns summary: Although she has more variability compared to her last visit her overall control and timing range is better as before hypoglycemia has present during the daytime mostly with blood sugars rising progressively until early afternoon and then more variability to the evening and overnight.  Hypoglycemia has been minimal    Dietician visit, most recent: 11/18  Weight history:  Wt Readings from Last 3 Encounters:  11/01/20 131 lb 8 oz (59.6 kg)  10/31/20 129 lb 1.3 oz (58.6 kg)  09/29/20 132 lb 9.6 oz (60.1 kg)    Glycemic control:   Lab Results  Component Value Date   HGBA1C 8.5 (A) 11/01/2020   HGBA1C 8.5 (H) 04/18/2020   HGBA1C 9.6 (A) 03/20/2020   Lab Results  Component Value Date   MICROALBUR 2.6 04/18/2020   LDLCALC 98 10/24/2020   CREATININE 0.98 10/24/2020   Lab Results  Component Value Date   MICRALBCREAT 17 04/18/2020    No results found for: FRUCTOSAMINE  Office Visit on 11/01/2020  Component Date Value Ref Range Status  . Hemoglobin A1C 11/01/2020 8.5* 4.0 - 5.6 % Final    Allergies as of 11/01/2020   No Known Allergies     Medication List       Accurate as of November 01, 2020 11:59 PM. If you have any questions, ask your nurse or doctor.        benzonatate 100 MG capsule Commonly known as: Tessalon Perles Take 1 capsule (100 mg total) by mouth 2 (two) times daily as needed for cough.   diazepam 10 MG tablet Commonly known as: VALIUM as needed.   Durezol 0.05 % Emul Generic drug: Difluprednate Apply 1 drop to eye daily at 8 pm.   Fish Oil 1000 MG Caps Take 1 capsule by mouth daily.   FreeStyle Libre 2 Reader [place] 1 each by Does not apply route See admin instructions. Use Freestyle libre 2 reader to monitor blood sugars continuously.   FreeStyle Libre 14 Day Reader [place] reader to monitor blood sugars continuously.   FreeStyle Libre 2  Sensor Misc Use to check blood sugars. Change every 14 days.   FreeStyle Precision Neo System w/Device Kit 1 kit by Does not apply route 2 (two) times  daily.   FreeStyle Precision Neo Test test strip Generic drug: glucose blood CHECKING BLOOD SUGAR TWICE DAILY.   Lantus SoloStar 100 UNIT/ML Solostar Pen Generic drug: insulin glargine Inject 68 Units into the skin daily. Inject 40 units under the skin in the morning and 28 units in the evening.   lisinopril 2.5 MG tablet Commonly known as: ZESTRIL TAKE ONE (1) TABLET BY MOUTH EVERY DAY   metoCLOPramide 5 MG tablet Commonly known as: REGLAN TAKE ONE TO TWO TABLETS BY MOUTH EVERY SIX HOURS AS NEEDED FOR NAUSEAOR VOMITING   montelukast 10 MG tablet Commonly known as: SINGULAIR TAKE ONE TABLET (10MG TOTAL) BY MOUTH ATBEDTIME   NovoLOG FlexPen 100 UNIT/ML FlexPen Generic drug: insulin aspart INJECT 8 TO 12 UNITS SUBCUTANEOUSLY BEFORE EACH MEAL AS DIRECTED. ADJUST PERSLIDING SCALE. MAXIMUM DOSE PER DAY IS 51 UNITS   omeprazole 40 MG capsule Commonly known as: PRILOSEC TAKE ONE CAPSULE BY MOUTH 30 MINUTES PRIOR TO BREAKFAST AND SUPPER   ondansetron 4 MG tablet Commonly known as: ZOFRAN TAKE ONE TABLET BY MOUTH EVERY EIGHT HOURS AS NEEDED FOR NAUSEA OR VOMITING   OneTouch Delica Lancets 72C Misc USE FOUR TIMES A DAY AS DIRECTED.   polyethylene glycol 17 g packet Commonly known as: MIRALAX / GLYCOLAX Take 17 g by mouth daily as needed.   pravastatin 80 MG tablet Commonly known as: PRAVACHOL TAKE ONE TABLET (80MG TOTAL) BY MOUTH EVERY EVENING   Sure Comfort Pen Needles 30G X 8 MM Misc Generic drug: Insulin Pen Needle 4 boxes (88day supply)   vitamin B-12 100 MCG tablet Commonly known as: CYANOCOBALAMIN Take 1 tablet by mouth daily.   VITAMIN D PO Take 1 Dose by mouth daily.       Allergies: No Known Allergies  Past Medical History:  Diagnosis Date  . Anxiety   . Dyslipidemia   . Gastroparesis due to DM ([place])  OCT 2014   75% AT 2 HRS, GLU >   . H/O eye surgery 01/2018  . Headache(784.0)   . IDDM (insulin dependent diabetes mellitus)   . Neuropathy   . Nicotine addiction   . TIA (transient ischemic attack)    Dr. Merlene Laughter    Past Surgical History:  Procedure Laterality Date  . Cataract surgery Bilateral 01/20/2017, 02/03/2017  . COLONOSCOPY N/A 05/09/2016   Dr. Oneida Alar: 2 hyperplastic polyps removed.  Next colonoscopy in 2027  . ESOPHAGOGASTRODUODENOSCOPY N/A 08/31/2013   SLF: 1. [place] satiety nausea may be due to Gastroparesis/pyloric channel stenosis. 2. small hiatal hernia 3. Moderate erosive gastritis.   Marland Kitchen EYE SURGERY  01/2018  . laser surgery bilateral eye      Family History  Problem Relation Age of Onset  . Hypertension Mother   . Diabetes Mother   . Hyperlipidemia Mother   . Rosacea Mother   . Hypertension Father   . Diabetes Brother   . Breast cancer Maternal Aunt   . Colon cancer Neg Hx     Social History:  reports that she quit smoking about 9 years ago. Her smoking use included cigarettes. She has a 36.00 pack-year smoking history. She has never used smokeless tobacco. She reports that she does not drink alcohol and does not use drugs.   Review of Systems   Lipid history: Followed by PCP, currently on pravastatin 80 mg Labs as follows She has aortic atherosclerosis on recent CT scan   Lab Results  Component Value Date   CHOL 182 10/24/2020   HDL 66 10/24/2020  [place] 98 10/24/2020   TRIG 101 10/24/2020   CHOLHDL 2.8 10/24/2020           Hypertension: Mild and only on 2.5 mg estrogen  BP Readings from Last 3 Encounters:  11/01/20 140/78  10/31/20 125/77  10/18/20 131/86    Most recent eye exam was in 01/2019  Most recent foot exam: 12/20  GASTROPARESIS: She has occasional nausea Will take Zofran as needed also Followed by gastroenterologist  She is on Reglan before meals, the gastroenterologist told her to not take more than 2 tablets a  day However she is usually eating 3 meals a day, no nausea on waking up in the morning  Currently known complications of diabetes: Gastroparesis, neuropathy, retinopathy, no nephropathy  LABS:  Office Visit on 11/01/2020  Component Date Value Ref Range Status  . Hemoglobin A1C 11/01/2020 8.5* 4.0 - 5.6 % Final    Physical Examination:  BP 140/78   Pulse 78   Ht '5\' 8"'  (1.727 m)   Wt 131 lb 8 oz (59.6 kg)   LMP 07/26/2013   SpO2 98%   BMI 19.99 kg/m        ASSESSMENT:  Diabetes type 1, poorly controlled with multiple complications  See history of present illness for detailed discussion of current diabetes management, blood sugar patterns from her CGM download and problems identified  Her A1c is again 8.5  Her blood sugars are again out of control and quite labile Although she is not having hypoglycemia usually blood sugars need more consistent managing She thinks her diet has been poor again and on her last visit was generally doing better with cutting back on high fat foods, some sweets and snacks Again because of cost she is only on Lantus and NovoLog and does not want any of the newer basal insulins Also refuses to consider insulin pump again  No consistent pattern of postprandial hyperglycemia Also at times will have high readings late at night from her higher fat intake for snacks Mealtime insulin is also difficult to adjust because she thinks that she may have higher readings unexpectedly 2 to 3 hours later because of her gastroparesis  GASTROPARESIS: On Reglan with fair control  LIPIDS: LDL is below 100 on pravastatin and followed by PCP    PLAN:   She will work on balanced meals Offered consultation with dietitian but she does not want to do this as yet, will benefit from carbohydrate counting instructions Cut back on high fat intake She will stay on the same doses of basal insulin but consider switching to [place] or [place] on the next visit Otherwise may  consider increasing her Lantus again in the morning to control daytime hypoglycemia Again discussed benefits of the insulin pump but she wants to wait  She will try to take 5 to 10 mg of Reglan with every meal, 3 times a day and additional dose if needed on a regular basis    Patient Instructions  Take 1 pill Reglan before each meal and extra as needed   Ashley Armstrong 11/02/2020, 11:52 AM   Note: This office note was prepared with Dragon voice recognition system technology. Any transcriptional errors that result from this process are unintentional.

## 2020-11-05 ENCOUNTER — Encounter: Payer: Self-pay | Admitting: Family Medicine

## 2020-11-05 NOTE — Assessment & Plan Note (Signed)

## 2020-11-06 ENCOUNTER — Encounter: Payer: Self-pay | Admitting: Gastroenterology

## 2020-11-08 ENCOUNTER — Other Ambulatory Visit: Payer: Self-pay | Admitting: Family Medicine

## 2020-11-09 DIAGNOSIS — F4323 Adjustment disorder with mixed anxiety and depressed mood: Secondary | ICD-10-CM | POA: Diagnosis not present

## 2020-11-14 ENCOUNTER — Telehealth: Payer: Self-pay | Admitting: Internal Medicine

## 2020-11-14 NOTE — Telephone Encounter (Signed)
Pt has questions about if she should use an enema or not. Please advise. [phone]

## 2020-11-14 NOTE — Telephone Encounter (Signed)
Returned pt's call states she wants to know if she can use an enema. She states it just doesn't feel like she is producing and having a good enough bowel movement. She may drink up to 2 bottles of water a day she states. I advised her she need to be drinking at least six 8 ounce glasses of water a day. I advised I would put note to you for your advise on her using enemas

## 2020-11-15 ENCOUNTER — Other Ambulatory Visit: Payer: Self-pay | Admitting: Endocrinology

## 2020-11-15 NOTE — Telephone Encounter (Signed)
She can use Miralax 17 grams TID until soft stool and then continue once or twice daily to maintain soft BM.  OK to use tap water or Fleet enema on occasion but not regularly. Would suggest using one enema now, then start miralax regimen.  Keep [place] posted if know results.   Keep upcoming ov.

## 2020-11-15 NOTE — Telephone Encounter (Signed)
Noted  

## 2020-11-15 NOTE — Telephone Encounter (Signed)
Phoned and advised the pt of instructions for Miralax and the Fleet enema (on occasions). Also advised the pt to keep [place] posted of her results. Pt did agree to keep upcoming appt. Pt also stated she didn't know she could do the Miralax more than once a day.

## 2020-11-16 DIAGNOSIS — Z23 Encounter for immunization: Secondary | ICD-10-CM | POA: Diagnosis not present

## 2020-11-22 ENCOUNTER — Ambulatory Visit: Payer: PPO | Admitting: Podiatry

## 2020-12-05 ENCOUNTER — Other Ambulatory Visit: Payer: Self-pay | Admitting: Endocrinology

## 2020-12-16 ENCOUNTER — Other Ambulatory Visit: Payer: Self-pay | Admitting: Gastroenterology

## 2021-01-03 ENCOUNTER — Ambulatory Visit: Payer: PPO | Admitting: Gastroenterology

## 2021-01-08 ENCOUNTER — Ambulatory Visit (HOSPITAL_COMMUNITY): Payer: PPO

## 2021-01-09 DIAGNOSIS — H43391 Other vitreous opacities, right eye: Secondary | ICD-10-CM | POA: Diagnosis not present

## 2021-01-09 DIAGNOSIS — E103513 Type 1 diabetes mellitus with proliferative diabetic retinopathy with macular edema, bilateral: Secondary | ICD-10-CM | POA: Diagnosis not present

## 2021-01-09 DIAGNOSIS — H35371 Puckering of macula, right eye: Secondary | ICD-10-CM | POA: Diagnosis not present

## 2021-01-09 LAB — HM DIABETES EYE EXAM

## 2021-02-02 ENCOUNTER — Ambulatory Visit: Payer: PPO | Admitting: Endocrinology

## 2021-02-09 ENCOUNTER — Other Ambulatory Visit: Payer: Self-pay | Admitting: Family Medicine

## 2021-02-09 ENCOUNTER — Other Ambulatory Visit: Payer: Self-pay | Admitting: Endocrinology

## 2021-02-12 ENCOUNTER — Ambulatory Visit: Payer: PPO | Admitting: Gastroenterology

## 2021-02-12 ENCOUNTER — Encounter: Payer: Self-pay | Admitting: Gastroenterology

## 2021-02-12 NOTE — Progress Notes (Deleted)
Primary Care Physician: Fayrene Helper, MD  Primary Gastroenterologist:    No chief complaint on file.   HPI: Ashley Armstrong is a 55 y.o. female here  Current Outpatient Medications  Medication Sig Dispense Refill  . benzonatate (TESSALON PERLES) 100 MG capsule Take 1 capsule (100 mg total) by mouth 2 (two) times daily as needed for cough. 30 capsule 0  . Blood Glucose Monitoring Suppl (FREESTYLE PRECISION NEO SYSTEM) w/Device KIT 1 kit by Does not apply route 2 (two) times daily. 1 kit 0  . Continuous Blood Gluc Receiver (FREESTYLE LIBRE 14 DAY READER) DEVI Use Freestyle Libre reader to monitor blood sugars continuously. 1 each 0  . Continuous Blood Gluc Receiver (FREESTYLE LIBRE 2 READER) [place] 1 each by Does not apply route See admin instructions. Use Freestyle libre 2 reader to monitor blood sugars continuously. 1 each 0  . Continuous Blood Gluc Sensor (FREESTYLE LIBRE 2 SENSOR) MISC USE ONE SENSOR ONCE EVERY 14 DAYS TO MONITOR BLOOD SUGARS 2 each 2  . diazepam (VALIUM) 10 MG tablet as needed.     . DUREZOL 0.05 % EMUL Apply 1 drop to eye daily at 8 pm.    . glucose blood (FREESTYLE PRECISION NEO TEST) test strip CHECKING BLOOD SUGAR TWICE DAILY. 100 strip 3  . LANTUS SOLOSTAR 100 UNIT/ML Solostar Pen INJECT 40 UNITS UNDER THE SKIN IN THE MORNING AND 28 UNITS IN THE EVENING 60 mL 5  . lisinopril (ZESTRIL) 2.5 MG tablet TAKE ONE (1) TABLET BY MOUTH EVERY DAY 90 tablet 1  . metoCLOPramide (REGLAN) 5 MG tablet TAKE ONE TO TWO TABLETS BY MOUTH EVERY SIX HOURS AS NEEDED FOR NAUSEAOR VOMITING 120 tablet 5  . montelukast (SINGULAIR) 10 MG tablet TAKE ONE TABLET (10MG TOTAL) BY MOUTH ATBEDTIME 90 tablet 1  . NOVOLOG FLEXPEN 100 UNIT/ML FlexPen INJECT 8 TO 12 UNITS SUBCUTANEOUSLY BEFORE EACH MEAL AS DIRECTED. ADJUST PERSLIDING SCALE. MAXIMUM DOSE PER DAY IS 51 UNITS 45 mL 0  . Omega-3 Fatty Acids (FISH OIL) 1000 MG CAPS Take 1 capsule by mouth daily.    Marland Kitchen omeprazole (PRILOSEC) 40 MG  capsule TAKE ONE CAPSULE BY MOUTH 30 MINUTES PRIOR TO BREAKFAST AND SUPPER 180 capsule 3  . ondansetron (ZOFRAN) 4 MG tablet TAKE ONE TABLET BY MOUTH EVERY EIGHT HOURS AS NEEDED FOR NAUSEA OR VOMITING 30 tablet 3  . ONETOUCH DELICA LANCETS 56Y MISC USE FOUR TIMES A DAY AS DIRECTED. 100 each 1  . polyethylene glycol (MIRALAX / GLYCOLAX) 17 g packet Take 17 g by mouth daily as needed.    . pravastatin (PRAVACHOL) 80 MG tablet TAKE ONE TABLET (80MG TOTAL) BY MOUTH EVERY EVENING 90 tablet 1  . SURE COMFORT PEN NEEDLES 30G X 8 MM MISC 4 boxes (88day supply) 4 packet 3  . vitamin B-12 (CYANOCOBALAMIN) 100 MCG tablet Take 1 tablet by mouth daily.    Marland Kitchen VITAMIN D PO Take 1 Dose by mouth daily.     No current facility-administered medications for this visit.    Allergies as of 02/12/2021  . (No Known Allergies)    ROS:  General: Negative for anorexia, weight loss, fever, chills, fatigue, weakness. ENT: Negative for hoarseness, difficulty swallowing , nasal congestion. CV: Negative for chest pain, angina, palpitations, dyspnea on exertion, peripheral edema.  Respiratory: Negative for dyspnea at rest, dyspnea on exertion, cough, sputum, wheezing.  GI: See history of present illness. GU:  Negative for dysuria, hematuria, urinary incontinence, urinary frequency, nocturnal  urination.  Endo: Negative for unusual weight change.    Physical Examination:   LMP 07/26/2013   General: Well-nourished, well-developed in no acute distress.  Eyes: No icterus. Mouth: Oropharyngeal mucosa moist and pink , no lesions erythema or exudate. Lungs: Clear to auscultation bilaterally.  Heart: Regular rate and rhythm, no murmurs rubs or gallops.  Abdomen: Bowel sounds are normal, nontender, nondistended, no hepatosplenomegaly or masses, no abdominal bruits or hernia , no rebound or guarding.   Extremities: No lower extremity edema. No clubbing or deformities. Neuro: Alert and oriented x 4   Skin: Warm and dry, no  jaundice.   Psych: Alert and cooperative, normal mood and affect.  Labs:  ***  Imaging Studies: No results found.

## 2021-02-21 ENCOUNTER — Other Ambulatory Visit: Payer: Self-pay | Admitting: Endocrinology

## 2021-02-26 ENCOUNTER — Encounter: Payer: Self-pay | Admitting: Endocrinology

## 2021-02-26 ENCOUNTER — Ambulatory Visit: Payer: PPO | Admitting: Endocrinology

## 2021-02-26 ENCOUNTER — Other Ambulatory Visit: Payer: Self-pay

## 2021-02-26 VITALS — BP 144/72 | HR 79 | Ht 68.0 in | Wt 129.2 lb

## 2021-02-26 DIAGNOSIS — E1065 Type 1 diabetes mellitus with hyperglycemia: Secondary | ICD-10-CM | POA: Diagnosis not present

## 2021-02-26 DIAGNOSIS — E1043 Type 1 diabetes mellitus with diabetic autonomic (poly)neuropathy: Secondary | ICD-10-CM | POA: Diagnosis not present

## 2021-02-26 DIAGNOSIS — K3184 Gastroparesis: Secondary | ICD-10-CM | POA: Diagnosis not present

## 2021-02-26 DIAGNOSIS — Z794 Long term (current) use of insulin: Secondary | ICD-10-CM | POA: Diagnosis not present

## 2021-02-26 DIAGNOSIS — E1140 Type 2 diabetes mellitus with diabetic neuropathy, unspecified: Secondary | ICD-10-CM | POA: Diagnosis not present

## 2021-02-26 LAB — POCT GLYCOSYLATED HEMOGLOBIN (HGB A1C): Hemoglobin A1C: 8.9 % — AB (ref 4.0–5.6)

## 2021-02-26 LAB — MICROALBUMIN / CREATININE URINE RATIO
Creatinine,U: 44.7 mg/dL
Microalb Creat Ratio: 7.2 mg/g (ref 0.0–30.0)
Microalb, Ur: 3.2 mg/dL — ABNORMAL HIGH (ref 0.0–1.9)

## 2021-02-26 NOTE — Progress Notes (Signed)
Patient ID: Ashley Armstrong, female   DOB: 1966/01/30, 55 y.o.   MRN: [id number]           Reason for Appointment: Follow-up for Type 1 Diabetes  Referring physician: Dr. [name]   History of Present Illness:          Date of diagnosis of type 1 diabetes mellitus: Age 26       Background history:  She has been on insulin practically since her diagnosis was made.  Initially she had symptoms of weight loss and blurred vision For several years has been on Lantus and NovoLog but usually has had poor control with A1c mostly between 8-10%  Recent history:     INSULIN regimen is:  Lantus 40 units a.m., 28 units p.m .  NovoLog at mealtimes mostly 8-14 units    Current management, blood sugar patterns and problems identified:  Her A1c is 8.9 compared to 8.5     She has overall higher blood sugars recently as seen on her CGM download below  She is observing that her blood sugars are rising when she is waking up even without any fluid  However her blood sugars are higher before lunch and dinner usually but not necessarily after lunch and dinner  May also have some continued increase in blood sugar after breakfast, periodically over 300  From blood sugar patterns appears to be needing higher basal insulin especially during the daytime  Also she thinks that sometimes she will have snacks late at night without any NovoLog coverage  Is as before reluctant to consider insulin pump or even changing her insulin brand and prefers to continue Lantus           Meal times are:  Breakfast is 7 AM at Lunch:  PM dinner:3 PM  Typical meal intake: Breakfast is sometimes high fat but otherwise eggs and toast.  Lunch usually a mixed meal and evening meal may be a sandwich with bland food.  Snacks will be cheese and crackers Usually avoiding drinks with sugar            Exercise: None    Glucose monitoring:  done 4 times a day         Glucometer:  Freestyle libre.       Blood Glucose  readings by time of day and averages from download   CONTINUOUS GLUCOSE MONITORING RECORD INTERPRETATION    Dates of Recording: Last 2 weeks  Sensor description: Libre 2   Glycemic patterns summary: She appears to have blood sugars above her target range on an average almost all the time except early morning and as before she has significant variability from day-to-day but slightly less than before  She appears to have a dawn phenomenon with blood sugars going up after about 5-6 AM until breakfast time  Premeal blood sugars are significantly higher at lunchtime and also somewhat before dinner  Time in range is much less than before  No hypoglycemia  Overnight blood sugars are mostly high although trending lower by 4 AM and only occasionally near normal  HIGHEST blood sugar on an average is around late morning and also relatively high around midnight  No consistent postprandial hyperglycemia seen but frequently has a rise in blood sugars after breakfast and late evening  Results statistics:  CGM use % of time  97  2-week average/GV  120/30  Time in range     29   %  % Time Above 180  38  %  Time above 250  33  % Time Below 70  0     PRE-MEAL Fasting Lunch Dinner Bedtime Overall  Glucose range:       Averages:  202  235  184   220   POST-MEAL PC Breakfast PC Lunch PC Dinner  Glucose range:     Averages:  257  246  222    PREVIOUSLY:  CGM use % of time  95  2-week average/GV 200/33  Time in range        41% was 43  % Time Above 180  34  % Time above 250  24  % Time Below 70 1     PRE-MEAL Fasting Lunch Dinner Bedtime Overall  Glucose range:       Averages:  173  199  196   200   POST-MEAL PC Breakfast PC Lunch PC Dinner  Glucose range:     Averages:  216  199  233      Dietician visit, most recent: 11/18  Weight history:  Wt Readings from Last 3 Encounters:  02/26/21 129 lb 3.2 oz (58.6 kg)  11/01/20 131 lb 8 oz (59.6 kg)  10/31/20 129 lb 1.3 oz  (58.6 kg)    Glycemic control:   Lab Results  Component Value Date   HGBA1C 8.9 (A) 02/26/2021   HGBA1C 8.5 (A) 11/01/2020   HGBA1C 8.5 (H) 04/18/2020   Lab Results  Component Value Date   MICROALBUR 2.6 04/18/2020   LDLCALC 98 10/24/2020   CREATININE 0.98 10/24/2020   Lab Results  Component Value Date   MICRALBCREAT 17 04/18/2020    No results found for: FRUCTOSAMINE  Office Visit on 02/26/2021  Component Date Value Ref Range Status  . Hemoglobin A1C 02/26/2021 8.9* 4.0 - 5.6 % Final    Allergies as of 02/26/2021   No Known Allergies     Medication List       Accurate as of February 26, 2021 12:09 PM. If you have any questions, ask your nurse or doctor.        benzonatate 100 MG capsule Commonly known as: Tessalon Perles Take 1 capsule (100 mg total) by mouth 2 (two) times daily as needed for cough.   diazepam 10 MG tablet Commonly known as: VALIUM as needed.   Durezol 0.05 % Emul Generic drug: Difluprednate Apply 1 drop to eye daily at 8 pm.   Fish Oil 1000 MG Caps Take 1 capsule by mouth daily.   FreeStyle Libre 2 Reader [place] 1 each by Does not apply route See admin instructions. Use Freestyle libre 2 reader to monitor blood sugars continuously.   FreeStyle Libre 14 Day Reader [place] Hotrontk reader to monitor blood sugars continuously.   FreeStyle Libre 2 Sensor Misc USE ONE SENSOR ONCE EVERY 14 DAYS TO MONITOR BLOOD SUGARS   FreeStyle Precision Neo System w/Device Kit 1 kit by Does not apply route 2 (two) times daily.   FreeStyle Precision Neo Test test strip Generic drug: glucose blood CHECKING BLOOD SUGAR TWICE DAILY.   Lantus SoloStar 100 UNIT/ML Solostar Pen Generic drug: insulin glargine INJECT 40 UNITS UNDER THE SKIN IN THE MORNING AND 28 UNITS IN THE EVENING   lisinopril 2.5 MG tablet Commonly known as: ZESTRIL TAKE ONE (1) TABLET BY MOUTH EVERY DAY   metoCLOPramide 5 MG tablet Commonly known as: REGLAN TAKE ONE TO TWO  TABLETS BY MOUTH EVERY SIX HOURS AS NEEDED FOR NAUSEAOR VOMITING   montelukast 10 MG  tablet Commonly known as: SINGULAIR TAKE ONE TABLET (10MG TOTAL) BY MOUTH ATBEDTIME   NovoLOG FlexPen 100 UNIT/ML FlexPen Generic drug: insulin aspart INJECT 8 TO 12 UNITS SUBCUTANEOUSLY BEFORE EACH MEAL AS DIRECTED. ADJUST PERSLIDING SCALE. MAXIMUM DOSE PER DAY IS 51 UNITS   omeprazole 40 MG capsule Commonly known as: PRILOSEC TAKE ONE CAPSULE BY MOUTH 30 MINUTES PRIOR TO BREAKFAST AND SUPPER   ondansetron 4 MG tablet Commonly known as: ZOFRAN TAKE ONE TABLET BY MOUTH EVERY EIGHT HOURS AS NEEDED FOR NAUSEA OR VOMITING   OneTouch Delica Lancets 30Q Misc USE FOUR TIMES A DAY AS DIRECTED.   polyethylene glycol 17 g packet Commonly known as: MIRALAX / GLYCOLAX Take 17 g by mouth daily as needed.   pravastatin 80 MG tablet Commonly known as: PRAVACHOL TAKE ONE TABLET (80MG TOTAL) BY MOUTH EVERY EVENING   Sure Comfort Pen Needles 30G X 8 MM Misc Generic drug: Insulin Pen Needle 4 boxes (88day supply)   vitamin B-12 100 MCG tablet Commonly known as: CYANOCOBALAMIN Take 1 tablet by mouth daily.   VITAMIN D PO Take 1 Dose by mouth daily.       Allergies: No Known Allergies  Past Medical History:  Diagnosis Date  . Anxiety   . Dyslipidemia   . Gastroparesis due to DM ([place]) OCT 2014   75% AT 2 HRS, GLU >   . H/O eye surgery 01/2018  . Headache(784.0)   . IDDM (insulin dependent diabetes mellitus)   . Neuropathy   . Nicotine addiction   . TIA (transient ischemic attack)    Dr. Merlene Laughter    Past Surgical History:  Procedure Laterality Date  . Cataract surgery Bilateral 01/20/2017, 02/03/2017  . COLONOSCOPY N/A 05/09/2016   Dr. Oneida Alar: 2 hyperplastic polyps removed.  Next colonoscopy in 2027  . ESOPHAGOGASTRODUODENOSCOPY N/A 08/31/2013   SLF: 1. [place]y satiety nausea may be due to Gastroparesis/pyloric channel stenosis. 2. small hiatal hernia 3. Moderate erosive gastritis.   [facility]  S[facility]  01/2018  . laser surgery bilateral eye      Family History  Problem Relation Age of Onset  . Hypertension Mother   . Diabetes Mother   . Hyperlipidemia Mother   . Rosacea Mother   . Hypertension Father   . Diabetes Brother   . Breast cancer Maternal Aunt   . Colon cancer Neg Hx     Social History:  reports that she quit smoking about 9 years ago. Her smoking use included cigarettes. She has a 36.00 pack-year smoking history. She has never used smokeless tobacco. She reports that she does not drink alcohol and does not use drugs.   Review of Systems   Lipid history: Followed by PCP, currently on pravastatin 80 mg Labs as follows She has aortic atherosclerosis on recent CT scan   Lab Results  Component Value Date   CHOL 182 10/24/2020   HDL 66 10/24/2020   LDLCALC 98 10/24/2020   TRIG 101 10/24/2020   CHOLHDL 2.8 10/24/2020           Hypertension: Mild and only on 2.5 mg estrogen  BP Readings from Last 3 Encounters:  02/26/21 (!) 144/72  11/01/20 140/78  10/31/20 125/77    Most recent eye exam was in 01/2019  Most recent foot exam: 12/20  GASTROPARESIS: She has occasional nausea Will take Zofran as needed  Followed by gastroenterologist  She is on Reglan before meals, was told to take this at least 3 times a day but still  taking it only twice a day Previously gastroenterologist told her to not take more than 2 tablets a day Usually eating 3 meals a day and 3 snacks  Currently known complications of diabetes: Gastroparesis, neuropathy, retinopathy, no nephropathy  LABS:  Office Visit on 02/26/2021  Component Date Value Ref Range Status  . Hemoglobin A1C 02/26/2021 8.9* 4.0 - 5.6 % Final    Physical Examination:  BP (!) 144/72   Pulse 79   Ht '5\' 8"'  (1.727 m)   Wt 129 lb 3.2 oz (58.6 kg)   LMP 07/26/2013   SpO2 99%   BMI 19.64 kg/m        ASSESSMENT:  Diabetes type 1, poorly controlled with multiple complications  See history of  present illness for detailed discussion of current diabetes management, blood sugar patterns from her CGM download and problems identified  Her A1c is consistently over 8% and now 8.9  Her blood sugars are again out of control Appears to be requiring higher doses of basal insulin especially in the daytime despite taking relatively large doses of Lantus Although her early morning blood sugars are reasonably good she appears to have a significant dawn phenomenon Again can do better with her diet especially snacks late at night  Blood sugar evaluation from her freestyle [employer] was reviewed with patient and interpretation is discussed in detail above Mealtime coverage appears to be overall relatively accurate except sometimes at breakfast  GASTROPARESIS: On Reglan with fair control  LIPIDS: LDL is below 100 on pravastatin and followed by PCP    PLAN:   Start walking regularly Given detailed instructions on increasing her basal insulin especially Lantus in the morning to get to afternoon blood sugars at least under 150 Switch to [place] when she finishes her Lantus She will take at least 5 units of NovoLog on waking up to cover dawn phenomenon and likely needs at least another 5 units at breakfast Discussed postprandial targets also  She will need to take 5 to 10 mg of Reglan with every meal, 3 times a day and additional dose if needed on a regular basis  Check urine microalbumin   Patient Instructions  Lantus 44 units in am, 30 in pm  If sugar is > 150 then go to 46 Lantus in am  Call before getting next refill  Novolog 5 units at least on waking up even if sugar normal, then 5-8 at breakfast again  Novolog 3-7 units at bedtime snacks  Reglan 3x daily 30 min before each meal  Check blood sugars on waking up    Also check blood sugars about 2 hours after meals and do this after different meals by rotation  Recommended blood sugar levels on waking up are 90-130 and  about 2 hours after meal is 130-180   walk daily     Total visit time including counseling = 30 minutes  Elayne Snare 02/26/2021, 12:09 PM   Note: This office note was prepared with Dragon voice recognition system technology. Any transcriptional errors that result from this process are unintentional.

## 2021-02-26 NOTE — Patient Instructions (Addendum)
Lantus 44 units in am, 30 in pm  If sugar is > 150 then go to 46 Lantus in am  Call before getting next refill  Novolog 5 units at least on waking up even if sugar normal, then 5-8 at breakfast again  Novolog 3-7 units at bedtime snacks  Reglan 3x daily 30 min before each meal  Check blood sugars on waking up    Also check blood sugars about 2 hours after meals and do this after different meals by rotation  Recommended blood sugar levels on waking up are 90-130 and about 2 hours after meal is 130-180   walk daily

## 2021-03-15 DIAGNOSIS — F4323 Adjustment disorder with mixed anxiety and depressed mood: Secondary | ICD-10-CM | POA: Diagnosis not present

## 2021-03-21 ENCOUNTER — Other Ambulatory Visit: Payer: Self-pay

## 2021-03-21 ENCOUNTER — Encounter: Payer: Self-pay | Admitting: Family Medicine

## 2021-03-21 ENCOUNTER — Ambulatory Visit (INDEPENDENT_AMBULATORY_CARE_PROVIDER_SITE_OTHER): Payer: PPO | Admitting: Family Medicine

## 2021-03-21 VITALS — BP 133/77 | HR 83 | Resp 16 | Ht 68.0 in | Wt 130.0 lb

## 2021-03-21 DIAGNOSIS — Z794 Long term (current) use of insulin: Secondary | ICD-10-CM | POA: Diagnosis not present

## 2021-03-21 DIAGNOSIS — F411 Generalized anxiety disorder: Secondary | ICD-10-CM | POA: Diagnosis not present

## 2021-03-21 DIAGNOSIS — E1140 Type 2 diabetes mellitus with diabetic neuropathy, unspecified: Secondary | ICD-10-CM

## 2021-03-21 DIAGNOSIS — E559 Vitamin D deficiency, unspecified: Secondary | ICD-10-CM | POA: Diagnosis not present

## 2021-03-21 DIAGNOSIS — F322 Major depressive disorder, single episode, severe without psychotic features: Secondary | ICD-10-CM | POA: Insufficient documentation

## 2021-03-21 DIAGNOSIS — E1042 Type 1 diabetes mellitus with diabetic polyneuropathy: Secondary | ICD-10-CM

## 2021-03-21 DIAGNOSIS — Z1159 Encounter for screening for other viral diseases: Secondary | ICD-10-CM

## 2021-03-21 DIAGNOSIS — E785 Hyperlipidemia, unspecified: Secondary | ICD-10-CM

## 2021-03-21 NOTE — Progress Notes (Signed)
Ashley Armstrong     MRN: [id number]      DOB: 04-Aug-1966   HPI Ashley Armstrong is here for follow up and re-evaluation of chronic medical conditions, medication management and review of any available recent lab and radiology data.  Preventive health is updated, specifically  Cancer screening and Immunization.   Questions or concerns regarding consultations or procedures which the PT has had in the interim are  addressed. The PT denies any adverse reactions to current medications since the last visit.  Blood sugar has worsened, and will work harder on this  ROS Denies recent fever or chills. Denies sinus pressure, nasal congestion, ear pain or sore throat. Denies chest congestion, productive cough or wheezing. Denies chest pains, palpitations and leg swelling Denies abdominal pain, nausea, vomiting,diarrhea or constipation.   Denies dysuria, frequency, hesitancy or incontinence. Denies joint pain, swelling and limitation in mobility. Denies headaches, seizures, numbness, or tingling. Denies skin break down or rash.   PE  BP 133/77   Pulse 83   Resp 16   Ht 5\' 8"  (1.727 m)   Wt 130 lb (59 kg)   LMP 07/26/2013   SpO2 98%   BMI 19.77 kg/m   Patient alert and oriented and in no cardiopulmonary distress.  HEENT: No facial asymmetry, EOMI,     Neck supple .  Chest: Clear to auscultation bilaterally.  CVS: S1, S2 no murmurs, no S3.Regular rate.  ABD: Soft non tender.   Ext: No edema  MS: Adequate ROM spine, shoulders, hips and knees.  Skin: Intact, no ulcerations or rash noted.  Psych: Good eye contact, normal affect. Memory intact not anxious or depressed appearing.  CNS: CN 2-12 intact, power,  normal throughout.no focal deficits noted.   Assessment & Plan  Generalized anxiety disorder Refer to therapy continue current Psych treatment  Depression, major, single episode, severe ([place]) Uncontrolled, refer to therapy  Diabetic peripheral neuropathy associated with type  1 diabetes mellitus ([place]) Deteriorated, treated by endo Ashley Armstrong is reminded of the importance of commitment to daily physical activity for 30 minutes or more, as able and the need to limit carbohydrate intake to 30 to 60 grams per meal to help with blood sugar control.   The need to take medication as prescribed, test blood sugar as directed, and to call between visits if there is a concern that blood sugar is uncontrolled is also discussed.   Ashley Armstrong is reminded of the importance of daily foot exam, annual eye examination, and good blood sugar, blood pressure and cholesterol control.  Diabetic Labs Latest Ref Rng & Units 02/26/2021 11/01/2020 10/24/2020 04/18/2020 03/20/2020  HbA1c 4.0 - 5.6 % 8.9(A) 8.5(A) - 8.5(H) 9.6(A)  Microalbumin 0.0 - 1.9 mg/dL 3.2(H) - - 2.6 -  Micro/Creat Ratio 0.0 - 30.0 mg/g 7.2 - - 17 -  Chol 100 - 199 mg/dL - - 182 187 -  HDL >39 mg/dL - - 66 67 -  Calc LDL 0 - 99 mg/dL - - 98 108(H) -  Triglycerides 0 - 149 mg/dL - - 101 42 -  Creatinine 0.57 - 1.00 mg/dL - - 0.98 0.99 -   BP/Weight 03/21/2021 02/26/2021 11/01/2020 10/31/2020 10/18/2020 09/29/2020 4/40/1027  Systolic BP 253 664 403 474 259 563 875  Diastolic BP 77 72 78 77 86 74 70  Wt. (Lbs) 130 129.2 131.5 129.08 - 132.6 130.04  BMI 19.77 19.64 19.99 19.63 - 20.16 19.77   Foot/eye exam completion dates Latest Ref Rng & Units  10/31/2020 10/26/2019  Eye Exam No Retinopathy - -  Foot Form Completion - Done Done        Hyperlipidemia LDL goal <100 Hyperlipidemia:Low fat diet discussed and encouraged.   Lipid Panel  Lab Results  Component Value Date   CHOL 182 10/24/2020   HDL 66 10/24/2020   LDLCALC 98 10/24/2020   TRIG 101 10/24/2020   CHOLHDL 2.8 10/24/2020   Controlled, no change in medication

## 2021-03-21 NOTE — Patient Instructions (Addendum)
Annual exam with pap end August, call if you need me before  Labs today lipid, cmp and EGFr, Vit D and hepatitis C screen   You are referred to therapist  Please work on your blood sugar, and start walking and dancing!  It is important that you exercise regularly at least 30 minutes 5 times a week. If you develop chest pain, have severe difficulty breathing, or feel very tired, stop exercising immediately and seek medical attention   Think about what you will eat, plan ahead. Choose " clean, green, fresh or frozen" over canned, processed or packaged foods which are more sugary, salty and fatty. 70 to 75% of food eaten should be vegetables and fruit. Three meals at set times with snacks allowed between meals, but they must be fruit or vegetables. Aim to eat over a 12 hour period , example 7 am to 7 pm, and STOP after  your last meal of the day. Drink water,generally about 64 ounces per day, no other drink is as healthy. Fruit juice is best enjoyed in a healthy way, by EATING the fruit. Thanks for choosing [facility], we consider it a privelige to serve you.

## 2021-03-21 NOTE — Assessment & Plan Note (Signed)
Uncontrolled, refer to therapy

## 2021-03-21 NOTE — Assessment & Plan Note (Signed)
Hyperlipidemia:Low fat diet discussed and encouraged.   Lipid Panel  Lab Results  Component Value Date   CHOL 182 10/24/2020   HDL 66 10/24/2020   LDLCALC 98 10/24/2020   TRIG 101 10/24/2020   CHOLHDL 2.8 10/24/2020   Controlled, no change in medication

## 2021-03-21 NOTE — Assessment & Plan Note (Signed)
Deteriorated, treated by endo Ashley Armstrong is reminded of the importance of commitment to daily physical activity for 30 minutes or more, as able and the need to limit carbohydrate intake to 30 to 60 grams per meal to help with blood sugar control.   The need to take medication as prescribed, test blood sugar as directed, and to call between visits if there is a concern that blood sugar is uncontrolled is also discussed.   Ashley Armstrong is reminded of the importance of daily foot exam, annual eye examination, and good blood sugar, blood pressure and cholesterol control.  Diabetic Labs Latest Ref Rng & Units 02/26/2021 11/01/2020 10/24/2020 04/18/2020 03/20/2020  HbA1c 4.0 - 5.6 % 8.9(A) 8.5(A) - 8.5(H) 9.6(A)  Microalbumin 0.0 - 1.9 mg/dL 3.2(H) - - 2.6 -  Micro/Creat Ratio 0.0 - 30.0 mg/g 7.2 - - 17 -  Chol 100 - 199 mg/dL - - 182 187 -  HDL >39 mg/dL - - 66 67 -  Calc LDL 0 - 99 mg/dL - - 98 108(H) -  Triglycerides 0 - 149 mg/dL - - 101 42 -  Creatinine 0.57 - 1.00 mg/dL - - 0.98 0.99 -   BP/Weight 03/21/2021 02/26/2021 11/01/2020 10/31/2020 10/18/2020 09/29/2020 05/30/9469  Systolic BP 962 836 629 476 546 503 546  Diastolic BP 77 72 78 77 86 74 70  Wt. (Lbs) 130 129.2 131.5 129.08 - 132.6 130.04  BMI 19.77 19.64 19.99 19.63 - 20.16 19.77   Foot/eye exam completion dates Latest Ref Rng & Units 10/31/2020 10/26/2019  Eye Exam No Retinopathy - -  Foot Form Completion - Done Done

## 2021-03-21 NOTE — Assessment & Plan Note (Signed)
Refer to therapy continue current Psych treatment

## 2021-03-22 ENCOUNTER — Telehealth: Payer: PPO

## 2021-03-22 LAB — CMP14+EGFR
ALT: 27 IU/L (ref 0–32)
AST: 24 IU/L (ref 0–40)
Albumin/Globulin Ratio: 1.7 (ref 1.2–2.2)
Albumin: 4.6 g/dL (ref 3.8–4.9)
Alkaline Phosphatase: 95 IU/L (ref 44–121)
BUN/Creatinine Ratio: 11 (ref 9–23)
BUN: 11 mg/dL (ref 6–24)
Bilirubin Total: 0.3 mg/dL (ref 0.0–1.2)
CO2: 23 mmol/L (ref 20–29)
Calcium: 9.9 mg/dL (ref 8.7–10.2)
Chloride: 99 mmol/L (ref 96–106)
Creatinine, Ser: 1.02 mg/dL — ABNORMAL HIGH (ref 0.57–1.00)
Globulin, Total: 2.7 g/dL (ref 1.5–4.5)
Glucose: 320 mg/dL — ABNORMAL HIGH (ref 65–99)
Potassium: 6.2 mmol/L — ABNORMAL HIGH (ref 3.5–5.2)
Sodium: 135 mmol/L (ref 134–144)
Total Protein: 7.3 g/dL (ref 6.0–8.5)
eGFR: 66 mL/min/{1.73_m2} (ref >59)

## 2021-03-22 LAB — LIPID PANEL
Chol/HDL Ratio: 2.6 ratio (ref 0.0–4.4)
Cholesterol, Total: 211 mg/dL — ABNORMAL HIGH (ref 100–199)
HDL: 81 mg/dL (ref >39)
LDL Chol Calc (NIH): 111 mg/dL — ABNORMAL HIGH (ref 0–99)
Triglycerides: 110 mg/dL (ref 0–149)
VLDL Cholesterol Cal: 19 mg/dL (ref 5–40)

## 2021-03-22 LAB — HEPATITIS C ANTIBODY: Hep C Virus Ab: <0.1 s/co ratio (ref 0.0–0.9)

## 2021-03-22 LAB — VITAMIN D 25 HYDROXY (VIT D DEFICIENCY, FRACTURES): Vit D, 25-Hydroxy: 62.4 ng/mL (ref 30.0–100.0)

## 2021-03-23 ENCOUNTER — Other Ambulatory Visit: Payer: Self-pay | Admitting: Nurse Practitioner

## 2021-03-23 ENCOUNTER — Telehealth: Payer: Self-pay | Admitting: Licensed Clinical Social Worker

## 2021-03-23 ENCOUNTER — Telehealth: Payer: Self-pay

## 2021-03-23 DIAGNOSIS — F411 Generalized anxiety disorder: Secondary | ICD-10-CM

## 2021-03-23 DIAGNOSIS — E875 Hyperkalemia: Secondary | ICD-10-CM

## 2021-03-23 NOTE — Telephone Encounter (Signed)
Let's draw a BMP next week just to recheck it. I already put the order in.

## 2021-03-23 NOTE — Telephone Encounter (Signed)
Pt says her blood sugar was 154, she has ate breakfast, and she feels completely fine. Please advise.

## 2021-03-23 NOTE — Telephone Encounter (Signed)
Patient called she got her lab results on mychart and is concerned about her potassium levels she wants to know if maybe she should have her blood redrawn ph# [phone]

## 2021-03-23 NOTE — Progress Notes (Signed)
bmp 

## 2021-03-23 NOTE — Telephone Encounter (Signed)
Her labs have not been reviewed yet. Can you review so I can call her please.

## 2021-03-23 NOTE — Telephone Encounter (Signed)
Her potassium is probably correct. Given her blood glucose level was 320, the combination of insulin deficiency and hyperosmolality induced by hyperglycemia frequently leads to elevated potassium levels.  In other words, insulin helps get potassium into your cells. When your blood sugar is that high, you don't have enough insulin. Instead of the potassium going into your cells, it hangs out in your bloodstream.  Since sugar attracts water, you urinate frequently and will lose a lot of potassium. Your blood will have high potassium levels, but you have low potassium in your cells.  If you get you blood sugar under control, the potassium should correct. If you feel bad, you should go to the emergency department so they can get your sugar controlled, rehydrate you, and correct your potassium.  What is her blood sugar now?      w

## 2021-03-23 NOTE — Telephone Encounter (Signed)
Pt informed

## 2021-03-23 NOTE — BH Specialist Note (Signed)
[place] Initial Clinical Assessment  MRN: [id number] NAME: Ashley Armstrong Date: 03/23/21  Start time:  230p End time:  3p Total time:  30 min Call number:  video visits  Type of Contact:  video Patient consent obtained:  yes Reason for Visit today:  VBH service  Treatment History Patient recently received Inpatient Treatment:  no  Facility/Program:    Date of discharge:   Patient currently being seen by therapist/psychiatrist:   Patient currently receiving the following services:    Past Psychiatric History/Hospitalization(s): Anxiety: Yes Bipolar Disorder: No Depression: No Mania: No Psychosis: No Schizophrenia: No Personality Disorder: No Hospitalization for psychiatric illness: No History of Electroconvulsive Shock Therapy: No Prior Suicide Attempts: No  Clinical Assessment:  PHQ-9 Assessments: Depression screen [facility] 2/9 10/31/2020 10/31/2020 06/15/2020  Decreased Interest 1 0 0  Down, Depressed, Hopeless 1 0 1  PHQ - 2 Score 2 0 1  Altered sleeping 3 - -  Tired, decreased energy 2 - -  Change in appetite 0 - -  Feeling bad or failure about yourself  2 - -  Trouble concentrating 3 - -  Moving slowly or fidgety/restless 2 - -  Suicidal thoughts 0 - -  PHQ-9 Score 14 - -  Difficult doing work/chores Extremely dIfficult - -  Some recent data might be hidden    GAD-7 Assessments: GAD 7 : Generalized Anxiety Score 03/21/2021 03/21/2021  Nervous, Anxious, on Edge 3 3  Control/stop worrying 1 2  Worry too much - different things 1 2  Trouble relaxing 3 3  Restless 1 1  Easily annoyed or irritable 1 0  Afraid - awful might happen 3 2  Total GAD 7 Score 13 13  Anxiety Difficulty - Somewhat difficult     Social Functioning Social maturity:  WNL Social judgement:  WNL  Stress Current stressors:  poor sleep and eating patterns Familial stressors:  none Sleep:  poor Appetite:  poor Coping ability:overwhelmed   Patient taking medications as  prescribed:    Current medications:  Outpatient Encounter Medications as of 03/23/2021  Medication Sig  . benzonatate (TESSALON PERLES) 100 MG capsule Take 1 capsule (100 mg total) by mouth 2 (two) times daily as needed for cough. (Patient not taking: Reported on 02/26/2021)  . Blood Glucose Monitoring Suppl (FREESTYLE PRECISION NEO SYSTEM) w/Device KIT 1 kit by Does not apply route 2 (two) times daily.  . Continuous Blood Gluc Receiver (FREESTYLE LIBRE 14 DAY READER) DEVI Use Freestyle Libre reader to monitor blood sugars continuously.  . Continuous Blood Gluc Receiver (FREESTYLE LIBRE 2 READER) DEVI 1 each by Does not apply route See admin instructions. Use Freestyle libre 2 reader to monitor blood sugars continuously.  . Continuous Blood Gluc Sensor (FREESTYLE LIBRE 2 SENSOR) MISC USE ONE SENSOR ONCE EVERY 14 DAYS TO MONITOR BLOOD SUGARS  . diazepam (VALIUM) 10 MG tablet as needed.   . DUREZOL 0.05 % EMUL Apply 1 drop to eye daily at 8 pm.  . glucose blood (FREESTYLE PRECISION NEO TEST) test strip CHECKING BLOOD SUGAR TWICE DAILY.  Marland Kitchen LANTUS SOLOSTAR 100 UNIT/ML Solostar Pen INJECT 40 UNITS UNDER THE SKIN IN THE MORNING AND 28 UNITS IN THE EVENING  . lisinopril (ZESTRIL) 2.5 MG tablet TAKE ONE (1) TABLET BY MOUTH EVERY DAY  . metoCLOPramide (REGLAN) 5 MG tablet TAKE ONE TO TWO TABLETS BY MOUTH EVERY SIX HOURS AS NEEDED FOR NAUSEAOR VOMITING  . montelukast (SINGULAIR) 10 MG tablet TAKE ONE TABLET (10MG TOTAL) BY  MOUTH ATBEDTIME  . NOVOLOG FLEXPEN 100 UNIT/ML FlexPen INJECT 8 TO 12 UNITS SUBCUTANEOUSLY BEFORE EACH MEAL AS DIRECTED. ADJUST PERSLIDING SCALE. MAXIMUM DOSE PER DAY IS 51 UNITS  . Omega-3 Fatty Acids (FISH OIL) 1000 MG CAPS Take 1 capsule by mouth daily.  Marland Kitchen omeprazole (PRILOSEC) 40 MG capsule TAKE ONE CAPSULE BY MOUTH 30 MINUTES PRIOR TO BREAKFAST AND SUPPER  . ondansetron (ZOFRAN) 4 MG tablet TAKE ONE TABLET BY MOUTH EVERY EIGHT HOURS AS NEEDED FOR NAUSEA OR VOMITING  . ONETOUCH DELICA  LANCETS 47M MISC USE FOUR TIMES A DAY AS DIRECTED.  Marland Kitchen polyethylene glycol (MIRALAX / GLYCOLAX) 17 g packet Take 17 g by mouth daily as needed.  . pravastatin (PRAVACHOL) 80 MG tablet TAKE ONE TABLET (80MG TOTAL) BY MOUTH EVERY EVENING  . SURE COMFORT PEN NEEDLES 30G X 8 MM MISC 4 boxes (88day supply)  . vitamin B-12 (CYANOCOBALAMIN) 100 MCG tablet Take 1 tablet by mouth daily.  Marland Kitchen VITAMIN D PO Take 1 Dose by mouth daily.   No facility-administered encounter medications on file as of 03/23/2021.    Self-harm Behaviors Risk Assessment Self-harm risk factors:   Patient endorses recent thoughts of harming self:    [place] Suicide Severity Rating Scale: No flowsheet data found.  Danger to Others Risk Assessment Danger to others risk factors:   Patient endorses recent thoughts of harming others:    Dynamic Appraisal of Situational Aggression (DASA): No flowsheet data found.  Substance Use Assessment Patient recently consumed alcohol:    Alcohol Use Disorder Identification Test (AUDIT): No flowsheet data found. Patient recently used drugs:    Opioid Risk Assessment:  Patient is concerned about dependence or abuse of substances:    ASAM Multidimensional Assessment Summary:  Dimension 1:    Dimension 1 Rating:    Dimension 2:    Dimension 2 Rating:    Dimension 3:    Dimension 3 Rating:    Dimension 4:    Dimension 4 Rating:    Dimension 5:    Dimension 5 Rating:    Dimension 6:    Dimension 6 Rating:   ASAM's Severity Rating Score:   ASAM Recommended Level of Treatment:     Goals, Interventions and Follow-up Plan Goals: Increase healthy adjustment to current life circumstances Interventions: Motivational Interviewing and Mindfulness or Relaxation Training Follow-up Plan: weekly VBH services  Summary of Clinical Assessment Summary: Ashley Armstrong is a 55 yr old woman that was referred by her PCP.  She reports frequent anxiety attacks that began while she was in the 6th grade. She  reports that she has a hard time staying in her home by herself therefore she will go to her mother's house most of the time.  She reports that she has difficulty with her health as well, diabetes.  She reports that she has had several surgeries to correct her vision and that she has poor kidneys.  She states that she does not eat well, going all day without eating. She is requesting weekly therapy sessions to reduce anxiety   Lubertha South, LCSW

## 2021-03-28 ENCOUNTER — Ambulatory Visit (HOSPITAL_COMMUNITY): Payer: PPO

## 2021-03-28 ENCOUNTER — Telehealth: Payer: Self-pay | Admitting: Licensed Clinical Social Worker

## 2021-03-28 ENCOUNTER — Other Ambulatory Visit: Payer: Self-pay

## 2021-03-28 ENCOUNTER — Other Ambulatory Visit: Payer: Self-pay | Admitting: Family Medicine

## 2021-03-28 DIAGNOSIS — E875 Hyperkalemia: Secondary | ICD-10-CM

## 2021-03-28 DIAGNOSIS — F322 Major depressive disorder, single episode, severe without psychotic features: Secondary | ICD-10-CM

## 2021-03-28 DIAGNOSIS — F411 Generalized anxiety disorder: Secondary | ICD-10-CM

## 2021-03-28 MED ORDER — SODIUM POLYSTYRENE SULFONATE PO POWD: Freq: Once | ORAL | 0 refills | Status: AC

## 2021-03-28 MED ORDER — ROSUVASTATIN CALCIUM 40 MG PO TABS: 40.0000 mg | ORAL_TABLET | Freq: Every day | ORAL | 3 refills | Status: DC

## 2021-03-28 MED ORDER — SODIUM POLYSTYRENE SULFONATE 15 GM/60ML PO SUSP: 45.0000 g | Freq: Once | ORAL | 0 refills | Status: AC

## 2021-03-28 MED ORDER — SODIUM POLYSTYRENE SULFONATE PO POWD
Freq: Once | ORAL | 0 refills | Status: DC
Start: 2021-03-28 — End: 2021-03-28

## 2021-03-28 NOTE — Progress Notes (Signed)
[facility] ([place]) Psychiatric Consultant Case Review   Ashley Armstrong is a 55 y.o. year old female with a history of depression, anxiety, type I diabetes, hyperlipidemia. Worsening in anxiety since her roommate moved out. She visit her mother as she has worsening in anxiety when she is by herself. Retired due to her vision. Used to work at [employer]. Single, no children.    Assessment/Provisional Diagnosis # MDD # Unspecified anxiety  She is not interested in pharmacological treatment, but is willing to learn coping skills.   Recommendation  - [facility] specialist to offer CBT - on valium prn. She rarely takes this medication  Thank you for your consult. We will continue to follow the patient. Please contact [place]  for any questions or concerns.   The above treatment considerations and suggestions are based on consultation with the [facility] specialist and/or PCP and a review of information available in the shared registry and the patient's [facility] Record (EHR). I have not personally examined the patient. All recommendations should be implemented with consideration of the patient's relevant prior history and current clinical status. Please feel free to call me with any questions about the care of this patient.

## 2021-03-28 NOTE — BH Specialist Note (Signed)
[facility] Treatment Plan Team Note  MRN: [id number] NAME: Ashley Armstrong  DATE: 03/28/21  Start time:   330pEnd time:  335p Total time:  55mn  Total number of Virtual BChicotTreatment Team Plan encounters: 1/4  Treatment Team Attendees: NRoyal Piedra LCSW; Dr. HModesta Messing Psychiatrist  Diagnoses: No diagnosis found.  Goals, Interventions and Follow-up Plan Goals: Increase healthy adjustment to current life circumstances Interventions: Motivational Interviewing Mindfulness or Relaxation Training Medication Management Recommendations: n/a; patient is not interested in pharmacological treatment Follow-up Plan: weekly VBH services  History of the present illness Presenting Problem/Current Symptoms: continued symptoms of depression and anxeity  Psychiatric History  Depression: No Anxiety: No Mania: No Psychosis: No PTSD symptoms: No  Past Psychiatric History/Hospitalization(s): Hospitalization for psychiatric illness: No Prior Suicide Attempts: No Prior Self-injurious behavior: No  Psychosocial stressors being alone, poor eating and sleep  Self-harm Behaviors Risk Assessment   Screenings PHQ-9 Assessments:  [facility] 10/31/2020 06/15/2020  Decreased Interest 1 0 0  Down, Depressed, Hopeless 1 0 1  PHQ - 2 Score 2 0 1  Altered sleeping 3 - -  Tired, decreased energy 2 - -  Change in appetite 0 - -  Feeling bad or failure about yourself  2 - -  Trouble concentrating 3 - -  Moving slowly or fidgety/restless 2 - -  Suicidal thoughts 0 - -  PHQ-9 Score 14 - -  Difficult doing work/chores Extremely dIfficult - -  Some recent data might be hidden   GAD-7 Assessments:  GAD 7 : Generalized Anxiety Score 03/21/2021 03/21/2021  Nervous, Anxious, on Edge 3 3  Control/stop worrying 1 2  Worry too much - different things 1 2  Trouble relaxing 3 3  Restless 1 1  Easily annoyed or irritable 1 0  Afraid - awful might happen 3 2  Total GAD  7 Score 13 13  Anxiety Difficulty - Somewhat difficult    Past Medical History Past Medical History:  Diagnosis Date  . Anxiety   . Dyslipidemia   . [place]   75% AT 2 H[place], GLU >   . H/O eye surgery 01/2018  . Headache(784.0)   . IDDM (insulin dependent diabetes mellitus)   . Neuropathy   . Nicotine addiction   . TIA (transient ischemic attack)    Dr. DMerlene Laughter   Vital signs: There were no vitals filed for this visit.  Allergies:  Allergies as of 03/28/2021  . (No Known Allergies)    Medication History Current medications:  Outpatient Encounter Medications as of 03/28/2021  Medication Sig  . benzonatate (TESSALON PERLES) 100 MG capsule Take 1 capsule (100 mg total) by mouth 2 (two) times daily as needed for cough. (Patient not taking: Reported on 02/26/2021)  . Blood Glucose Monitoring Suppl (FREESTYLE PRECISION NEO SYSTEM) w/Device KIT 1 kit by Does not apply route 2 (two) times daily.  . Continuous Blood Gluc Receiver (FREESTYLE LIBRE 14 DAY READER) DEVI Use Freestyle Libre reader to monitor blood sugars continuously.  . Continuous Blood Gluc Receiver (FREESTYLE LIBRE 2 READER) DEVI 1 each by Does not apply route See admin instructions. Use Freestyle libre 2 reader to monitor blood sugars continuously.  . Continuous Blood Gluc Sensor (FREESTYLE LIBRE 2 SENSOR) MISC USE ONE SENSOR ONCE EVERY 14 DAYS TO MONITOR BLOOD SUGARS  . diazepam (VALIUM) 10 MG tablet as needed.   . DUREZOL 0.05 % EMUL Apply 1 drop to eye daily at  8 pm.  . glucose blood (FREESTYLE PRECISION NEO TEST) test strip CHECKING BLOOD SUGAR TWICE DAILY.  Marland Kitchen LANTUS SOLOSTAR 100 UNIT/ML Solostar Pen INJECT 40 UNITS UNDER THE SKIN IN THE MORNING AND 28 UNITS IN THE EVENING  . lisinopril (ZESTRIL) 2.5 MG tablet TAKE ONE (1) TABLET BY MOUTH EVERY DAY  . metoCLOPramide (REGLAN) 5 MG tablet TAKE ONE TO TWO TABLETS BY MOUTH EVERY SIX HOURS AS NEEDED FOR NAUSEAOR VOMITING  . montelukast  (SINGULAIR) 10 MG tablet TAKE ONE TABLET (10MG TOTAL) BY MOUTH ATBEDTIME  . NOVOLOG FLEXPEN 100 UNIT/ML FlexPen INJECT 8 TO 12 UNITS SUBCUTANEOUSLY BEFORE EACH MEAL AS DIRECTED. ADJUST PERSLIDING SCALE. MAXIMUM DOSE PER DAY IS 51 UNITS  . Omega-3 Fatty Acids (FISH OIL) 1000 MG CAPS Take 1 capsule by mouth daily.  Marland Kitchen omeprazole (PRILOSEC) 40 MG capsule TAKE ONE CAPSULE BY MOUTH 30 MINUTES PRIOR TO BREAKFAST AND SUPPER  . ondansetron (ZOFRAN) 4 MG tablet TAKE ONE TABLET BY MOUTH EVERY EIGHT HOURS AS NEEDED FOR NAUSEA OR VOMITING  . ONETOUCH DELICA LANCETS 52Z MISC USE FOUR TIMES A DAY AS DIRECTED.  Marland Kitchen polyethylene glycol (MIRALAX / GLYCOLAX) 17 g packet Take 17 g by mouth daily as needed.  . rosuvastatin (CRESTOR) 40 MG tablet Take 1 tablet (40 mg total) by mouth daily.  . sodium polystyrene (KAYEXALATE) 15 GM/60ML suspension Take 180 mLs (45 g total) by mouth once for 1 dose.  . sodium polystyrene (KAYEXALATE) powder Take by mouth once for 1 dose.  . SURE COMFORT PEN NEEDLES 30G X 8 MM MISC 4 boxes (88day supply)  . vitamin B-12 (CYANOCOBALAMIN) 100 MCG tablet Take 1 tablet by mouth daily.  Marland Kitchen VITAMIN D PO Take 1 Dose by mouth daily.   No facility-administered encounter medications on file as of 03/28/2021.     Scribe for Treatment Team: Lubertha South, LCSW

## 2021-03-29 ENCOUNTER — Telehealth: Payer: PPO

## 2021-03-29 ENCOUNTER — Other Ambulatory Visit: Payer: Self-pay

## 2021-03-29 ENCOUNTER — Other Ambulatory Visit (HOSPITAL_COMMUNITY)
Admission: RE | Admit: 2021-03-29 | Discharge: 2021-03-29 | Disposition: A | Discharge status: Home / Self Care | Payer: PPO | Source: Ambulatory Visit | Attending: Family Medicine | Admitting: Family Medicine

## 2021-03-29 ENCOUNTER — Telehealth: Payer: Self-pay

## 2021-03-29 DIAGNOSIS — E1140 Type 2 diabetes mellitus with diabetic neuropathy, unspecified: Secondary | ICD-10-CM

## 2021-03-29 DIAGNOSIS — Z794 Long term (current) use of insulin: Secondary | ICD-10-CM | POA: Diagnosis not present

## 2021-03-29 LAB — BASIC METABOLIC PANEL
Anion gap: 7 (ref 5–15)
BUN: 13 mg/dL (ref 6–20)
CO2: 28 mmol/L (ref 22–32)
Calcium: 8.9 mg/dL (ref 8.9–10.3)
Chloride: 102 mmol/L (ref 98–111)
Creatinine, Ser: 0.94 mg/dL (ref 0.44–1.00)
GFR, Estimated: >60 mL/min (ref >60)
Glucose, Bld: 237 mg/dL — ABNORMAL HIGH (ref 70–99)
Potassium: 3.8 mmol/L (ref 3.5–5.1)
Sodium: 137 mmol/L (ref 135–145)

## 2021-03-29 NOTE — Telephone Encounter (Signed)
Patient is currently at the hospital waiting to get blood work done need a new order , the order they have ordered under a dr Saralyn Pilar.

## 2021-03-29 NOTE — Telephone Encounter (Signed)
Lab work reordered

## 2021-04-04 ENCOUNTER — Telehealth: Payer: Self-pay | Admitting: Licensed Clinical Social Worker

## 2021-04-04 DIAGNOSIS — F411 Generalized anxiety disorder: Secondary | ICD-10-CM

## 2021-04-04 NOTE — BH Specialist Note (Signed)
[place] Telephone Follow-up  MRN: [id number] NAME: Ashley Armstrong Date: 04/04/21  Start time:   230pEnd time:  3p Total time:  27mn Call number:  initially a video visit; connection became poor then it became a phone call  Reason for call today:  follow up  PHQ-9 Scores:  [facility] 10/31/2020 06/15/2020 03/20/2020 03/18/2019  Decreased Interest 1 0 0 2 0  Down, Depressed, Hopeless 1 0 _0 PHQ - 2 Score 2 0 _1 Altered sleeping 3 - - 3 3  Tired, decreased energy 2 - - 3 3  Change in appetite 0 - - 1 2  Feeling bad or failure about yourself  2 - - 1 2  Trouble concentrating 3 - - 0 2  Moving slowly or fidgety/restless 2 - - 0 1  Suicidal thoughts 0 - - 0 0  PHQ-9 Score 14 - - 12 15  Difficult doing work/chores Extremely dIfficult - - Somewhat difficult Somewhat difficult  Some recent data might be hidden   GAD-7 Scores:  GAD 7 : Generalized Anxiety Score 04/04/2021 03/21/2021 03/21/2021  Nervous, Anxious, on Edge _2 Control/stop worrying _3 Worry too much - different things _4 Trouble relaxing _5 Restless _6 Easily annoyed or irritable 3 1 0  Afraid - awful might happen _7 Total GAD 7 Score _8 Anxiety Difficulty Very difficult - Somewhat difficult    Stress Current stressors:  finances Sleep:  poor Appetite:  poor Coping ability:  overwhelmed Patient taking medications as prescribed:  yes  Current medications:  Outpatient Encounter Medications as of 04/04/2021  Medication Sig  . benzonatate (TESSALON PERLES) 100 MG capsule Take 1 capsule (100 mg total) by mouth 2 (two) times daily as needed for cough. (Patient not taking: Reported on 02/26/2021)  . Blood Glucose Monitoring Suppl (FREESTYLE PRECISION NEO SYSTEM) w/Device KIT 1 kit by Does not apply route 2 (two) times daily.  . Continuous Blood Gluc Receiver (FREESTYLE LIBRE 14 DAY READER) DEVI Use Freestyle Libre reader to monitor blood sugars  continuously.  . Continuous Blood Gluc Receiver (FREESTYLE LIBRE 2 READER) DEVI 1 each by Does not apply route See admin instructions. Use Freestyle libre 2 reader to monitor blood sugars continuously.  . Continuous Blood Gluc Sensor (FREESTYLE LIBRE 2 SENSOR) MISC USE ONE SENSOR ONCE EVERY 14 DAYS TO MONITOR BLOOD SUGARS  . diazepam (VALIUM) 10 MG tablet as needed.   . DUREZOL 0.05 % EMUL Apply 1 drop to eye daily at 8 pm.  . glucose blood (FREESTYLE PRECISION NEO TEST) test strip CHECKING BLOOD SUGAR TWICE DAILY.  .Marland KitchenLANTUS SOLOSTAR 100 UNIT/ML Solostar Pen INJECT 40 UNITS UNDER THE SKIN IN THE MORNING AND 28 UNITS IN THE EVENING  . lisinopril (ZESTRIL) 2.5 MG tablet TAKE ONE (1) TABLET BY MOUTH EVERY DAY  . metoCLOPramide (REGLAN) 5 MG tablet TAKE ONE TO TWO TABLETS BY MOUTH EVERY SIX HOURS AS NEEDED FOR NAUSEAOR VOMITING  . montelukast (SINGULAIR) 10 MG tablet TAKE ONE TABLET (10MG TOTAL) BY MOUTH ATBEDTIME  . NOVOLOG FLEXPEN 100 UNIT/ML FlexPen INJECT 8 TO 12 UNITS SUBCUTANEOUSLY BEFORE EACH MEAL AS DIRECTED. ADJUST PERSLIDING SCALE. MAXIMUM DOSE PER DAY IS 51 UNITS  . Omega-3 Fatty Acids (FISH OIL) 1000 MG CAPS Take 1 capsule by mouth daily.  .Marland Kitchenomeprazole (PRILOSEC) 40 MG capsule TAKE ONE  CAPSULE BY MOUTH 30 MINUTES PRIOR TO BREAKFAST AND SUPPER  . ondansetron (ZOFRAN) 4 MG tablet TAKE ONE TABLET BY MOUTH EVERY EIGHT HOURS AS NEEDED FOR NAUSEA OR VOMITING  . ONETOUCH DELICA LANCETS 40J MISC USE FOUR TIMES A DAY AS DIRECTED.  Marland Kitchen polyethylene glycol (MIRALAX / GLYCOLAX) 17 g packet Take 17 g by mouth daily as needed.  . rosuvastatin (CRESTOR) 40 MG tablet Take 1 tablet (40 mg total) by mouth daily.  . SURE COMFORT PEN NEEDLES 30G X 8 MM MISC 4 boxes (88day supply)  . vitamin B-12 (CYANOCOBALAMIN) 100 MCG tablet Take 1 tablet by mouth daily.  Marland Kitchen VITAMIN D PO Take 1 Dose by mouth daily.   No facility-administered encounter medications on file as of 04/04/2021.     Self-harm Behaviors Risk  Assessment Self-harm risk factors:   Patient endorses recent thoughts of harming self:    [place] Suicide Severity Rating Scale: No flowsheet data found. No flowsheet data found.   Danger to Others Risk Assessment Danger to others risk factors:   Patient endorses recent thoughts of harming others:    Dynamic Appraisal of Situational Aggression (DASA): No flowsheet data found.   Substance Use Assessment Patient recently consumed alcohol:    Alcohol Use Disorder Identification Test (AUDIT): No flowsheet data found. Patient recently used drugs:    Opioid Risk Assessment:    Goals, Interventions and Follow-up Plan Goals: Increase healthy adjustment to current life circumstances Interventions: Solution-Focused Strategies and Mindfulness or Relaxation Training Follow-up Plan: Weekly VBH follow up  Summary: Ashley Armstrong was present for her follow up session.  She reports continued anxiousness several times per day.  When she feels on edge, irritable she attempts to go to her mother's home to calm down.  Most of the time, she is fearful to be alone.  She denies any trauma in her past or present. She continues to eat poorly, some days not eating at all. She worries about her failing health including her eye sight.  She was provided with homework, walking trail or track for at least 5 minutes per day and also finding something enjoyable to engage in.  Will follow up in one week.  Lubertha South, LCSW

## 2021-04-05 ENCOUNTER — Other Ambulatory Visit: Payer: Self-pay

## 2021-04-05 ENCOUNTER — Telehealth: Payer: PPO

## 2021-04-06 ENCOUNTER — Ambulatory Visit (HOSPITAL_COMMUNITY): Payer: PPO

## 2021-04-12 ENCOUNTER — Other Ambulatory Visit: Payer: Self-pay

## 2021-04-12 ENCOUNTER — Telehealth (INDEPENDENT_AMBULATORY_CARE_PROVIDER_SITE_OTHER): Payer: PPO | Admitting: Licensed Clinical Social Worker

## 2021-04-12 DIAGNOSIS — F411 Generalized anxiety disorder: Secondary | ICD-10-CM

## 2021-04-16 ENCOUNTER — Other Ambulatory Visit: Payer: Self-pay | Admitting: Endocrinology

## 2021-04-17 ENCOUNTER — Telehealth: Payer: Self-pay | Admitting: Family Medicine

## 2021-04-17 NOTE — Telephone Encounter (Signed)
Left message for patient to call back and schedule Medicare Annual Wellness Visit (AWV) either virtually or in office.   Last AWV 03/20/20  please schedule at anytime with [facility]  health coach  This should be a 40 minute visit.

## 2021-04-18 ENCOUNTER — Other Ambulatory Visit: Payer: Self-pay

## 2021-04-19 ENCOUNTER — Telehealth (INDEPENDENT_AMBULATORY_CARE_PROVIDER_SITE_OTHER): Payer: PPO | Admitting: Licensed Clinical Social Worker

## 2021-04-19 ENCOUNTER — Other Ambulatory Visit: Payer: Self-pay

## 2021-04-19 DIAGNOSIS — F411 Generalized anxiety disorder: Secondary | ICD-10-CM

## 2021-04-19 DIAGNOSIS — F322 Major depressive disorder, single episode, severe without psychotic features: Secondary | ICD-10-CM

## 2021-04-23 ENCOUNTER — Ambulatory Visit: Payer: PPO | Admitting: Podiatry

## 2021-04-23 ENCOUNTER — Other Ambulatory Visit: Payer: Self-pay

## 2021-04-23 ENCOUNTER — Encounter: Payer: Self-pay | Admitting: Podiatry

## 2021-04-23 ENCOUNTER — Ambulatory Visit (INDEPENDENT_AMBULATORY_CARE_PROVIDER_SITE_OTHER): Payer: PPO

## 2021-04-23 VITALS — Ht 68.0 in | Wt 130.0 lb

## 2021-04-23 DIAGNOSIS — Z1231 Encounter for screening mammogram for malignant neoplasm of breast: Secondary | ICD-10-CM

## 2021-04-23 DIAGNOSIS — Z0000 Encounter for general adult medical examination without abnormal findings: Secondary | ICD-10-CM | POA: Diagnosis not present

## 2021-04-23 DIAGNOSIS — M79675 Pain in left toe(s): Secondary | ICD-10-CM

## 2021-04-23 DIAGNOSIS — M79674 Pain in right toe(s): Secondary | ICD-10-CM | POA: Diagnosis not present

## 2021-04-23 DIAGNOSIS — E1142 Type 2 diabetes mellitus with diabetic polyneuropathy: Secondary | ICD-10-CM

## 2021-04-23 DIAGNOSIS — B351 Tinea unguium: Secondary | ICD-10-CM | POA: Diagnosis not present

## 2021-04-23 NOTE — Progress Notes (Addendum)
Subjective:   Ashley Armstrong is a 55 y.o. female who presents for Medicare Annual (Subsequent) preventive examination.  Review of Systems     Cardiac Risk Factors include: diabetes mellitus;dyslipidemia;sedentary lifestyle;smoking/ tobacco exposure     Objective:    Today's Vitals   04/23/21 1446 04/23/21 1447  Weight: 130 lb (59 kg)   Height: 5' 8" (1.727 m)   PainSc:  0-No pain   Body mass index is 19.77 kg/m.  Advanced Directives 05/18/2018 03/11/2018 02/12/2017 11/05/2016 05/09/2016 07/27/2015 04/08/2015  Does Patient Have a Medical Advance Directive? _0  No No  Would patient like information on creating a medical advance directive? - No - Patient declined Yes (MAU/Ambulatory/Procedural Areas - Information given) No - Patient declined No - patient declined information - No - patient declined information  Pre-existing out of facility DNR order (yellow form or pink MOST form) - - - - - - -    Current Medications (verified) Outpatient Encounter Medications as of 04/23/2021  Medication Sig   benzonatate (TESSALON PERLES) 100 MG capsule Take 1 capsule (100 mg total) by mouth 2 (two) times daily as needed for cough.   Blood Glucose Monitoring Suppl (FREESTYLE PRECISION NEO SYSTEM) w/Device KIT 1 kit by Does not apply route 2 (two) times daily.   Continuous Blood Gluc Receiver (FREESTYLE LIBRE 14 DAY READER) DEVI Use Freestyle Libre reader to monitor blood sugars continuously.   Continuous Blood Gluc Receiver (FREESTYLE LIBRE 2 READER) DEVI 1 each by Does not apply route See admin instructions. Use Freestyle libre 2 reader to monitor blood sugars continuously.   Continuous Blood Gluc Sensor (FREESTYLE LIBRE 2 SENSOR) MISC USE ONE SENSOR ONCE EVERY 14 DAYS TO MONITOR BLOOD SUGARS   diazepam (VALIUM) 10 MG tablet as needed.    DUREZOL 0.05 % EMUL Apply 1 drop to eye daily at 8 pm.   FREESTYLE PRECISION NEO TEST test strip CHECKING BLOOD SUGAR TWICE DAILY.   LANTUS SOLOSTAR 100  UNIT/ML Solostar Pen INJECT 40 UNITS UNDER THE SKIN IN THE MORNING AND 28 UNITS IN THE EVENING   lisinopril (ZESTRIL) 2.5 MG tablet TAKE ONE (1) TABLET BY MOUTH EVERY DAY   metoCLOPramide (REGLAN) 5 MG tablet TAKE ONE TO TWO TABLETS BY MOUTH EVERY SIX HOURS AS NEEDED FOR NAUSEAOR VOMITING   montelukast (SINGULAIR) 10 MG tablet TAKE ONE TABLET (10MG TOTAL) BY MOUTH ATBEDTIME   NOVOLOG FLEXPEN 100 UNIT/ML FlexPen INJECT 8 TO 12 UNITS SUBCUTANEOUSLY BEFORE EACH MEAL AS DIRECTED. ADJUST PERSLIDING SCALE. MAXIMUM DOSE PER DAY IS 51 UNITS   Omega-3 Fatty Acids (FISH OIL) 1000 MG CAPS Take 1 capsule by mouth daily.   omeprazole (PRILOSEC) 40 MG capsule TAKE ONE CAPSULE BY MOUTH 30 MINUTES PRIOR TO BREAKFAST AND SUPPER   ondansetron (ZOFRAN) 4 MG tablet TAKE ONE TABLET BY MOUTH EVERY EIGHT HOURS AS NEEDED FOR NAUSEA OR VOMITING   ONETOUCH DELICA LANCETS 87O MISC USE FOUR TIMES A DAY AS DIRECTED.   polyethylene glycol (MIRALAX / GLYCOLAX) 17 g packet Take 17 g by mouth daily as needed.   rosuvastatin (CRESTOR) 40 MG tablet Take 1 tablet (40 mg total) by mouth daily.   SPS 15 GM/60ML suspension Take by mouth.   SURE COMFORT PEN NEEDLES 30G X 8 MM MISC 4 boxes (88day supply)   vitamin B-12 (CYANOCOBALAMIN) 100 MCG tablet Take 1 tablet by mouth daily.   VITAMIN D PO Take 1 Dose by mouth daily.   No facility-administered encounter medications on  file as of 04/23/2021.    Allergies (verified) Patient has no known allergies.   History: Past Medical History:  Diagnosis Date   Anxiety    Dyslipidemia    Gastroparesis due to DM ([place]) OCT 2014   75% AT 2 HRS, GLU >    H/O eye surgery 01/2018   Headache(784.0)    IDDM (insulin dependent diabetes mellitus)    Neuropathy    Nicotine addiction    TIA (transient ischemic attack)    Dr. Merlene Laughter   Past Surgical History:  Procedure Laterality Date   Cataract surgery Bilateral 01/20/2017, 02/03/2017   COLONOSCOPY N/A 05/09/2016   Dr. Oneida Alar: 2 hyperplastic  polyps removed.  Next colonoscopy in 2027   ESOPHAGOGASTRODUODENOSCOPY N/A 08/31/2013   SLF: 1. Earky satiety nausea may be due to Gastroparesis/pyloric channel stenosis. 2. small hiatal hernia 3. Moderate erosive gastritis.    EYE SURGERY  01/2018   laser surgery bilateral eye     Family History  Problem Relation Age of Onset   Hypertension Mother    Diabetes Mother    Hyperlipidemia Mother    Rosacea Mother    Hypertension Father    Diabetes Brother    Breast cancer Maternal Aunt    Colon cancer Neg Hx    Social History   Socioeconomic History   Marital status: Single    Spouse name: Not on file   Number of children: Not on file   Years of education: Not on file   Highest education level: Not on file  Occupational History   Occupation: disabled  Tobacco Use   Smoking status: Former    Packs/day: 1.50    Years: 24.00    Pack years: 36.00    Types: Cigarettes    Quit date: 08/24/2011    Years since quitting: 9.6   Smokeless tobacco: Never  Vaping Use   Vaping Use: Never used  Substance and Sexual Activity   Alcohol use: No   Drug use: No   Sexual activity: Not Currently    Birth control/protection: None  Other Topics Concern   Not on file  Social History Narrative   Disabled, lives with a room mate   Social Determinants of Health   Financial Resource Strain: Low Risk    Difficulty of Paying Living Expenses: Not very hard  Food Insecurity: No Food Insecurity   Worried About Charity fundraiser in the Last Year: Never true   [place]ing [place] in the Last Year: Never true  Transportation Needs: No Transportation Needs   Lack of Transportation (Medical): No   Lack of Transportation (Non-Medical): No  Physical Activity: Inactive   Days of Exercise per Week: 0 days   Minutes of Exercise per Session: 0 min  Stress: Stress Concern Present   Feeling of Stress : Very much  Social Connections: Moderately Integrated   Frequency of Communication with Friends and  Family: More than three times a week   Frequency of Social Gatherings with Friends and Family: More than three times a week   Attends Religious Services: More than 4 times per year   Active Member of Clubs or Organizations: Yes   Attends Music therapist: More than 4 times per year   Marital Status: Never married    Tobacco Counseling Counseling given: Not Answered   Clinical Intake:  Pre-visit preparation completed: No  Pain : No/denies pain Pain Score: 0-No pain     Nutritional Status: BMI of 19-24  Normal Diabetes:  Yes CBG done?: No Did pt. bring in CBG monitor from home?: No  How often do you need to have someone help you when you read instructions, pamphlets, or other written materials from your doctor or pharmacy?: 1 - Never What is the last grade level you completed in school?: 9th grade  Diabetic? yes  Interpreter Needed?: No  Information entered by :: Wandalee Klang LPN   Activities of Daily Living In your present state of health, do you have any difficulty performing the following activities: 04/23/2021  Hearing? N  Vision? N  Difficulty concentrating or making decisions? N  Walking or climbing stairs? N  Dressing or bathing? N  Doing errands, shopping? Y  Comment due to anxiety has to have someone with her  Preparing Food and eating ? N  Using the Toilet? N  In the past six months, have you accidently leaked urine? N  Do you have problems with loss of bowel control? N  Managing your Medications? N  Managing your Finances? N  Housekeeping or managing your Housekeeping? N  Some recent data might be hidden    Patient Care Team: Fayrene Helper, MD as PCP - [facility], Marga Melnick, MD (Inactive) as Consulting Physician (Gastroenterology) Sherlynn Stalls, MD as Consulting Physician (Ophthalmology) Darleen Crocker, MD as Consulting Physician (Ophthalmology) Herminio Commons, MD (Inactive) as Attending Physician (Cardiology) Landis Martins, DPM as Consulting Physician (Podiatry)  Indicate any recent Medical Services you may have received from other than [facility] providers in the past year (date may be approximate).     Assessment:   This is a routine wellness examination for Shanzay.  Hearing/Vision screen No results found.  Dietary issues and exercise activities discussed: Current Exercise Habits: The patient does not participate in regular exercise at present, Exercise limited by: None identified   Goals Addressed             This Visit's Progress    DIET - INCREASE WATER INTAKE   Not on track    Exercise 3x per week (30 min per time)   Not on track    Recommend starting a routine exercise program at least 3 days a week for 30-45 minutes at a time as tolerated.        Patient Stated       Get mammogram       Patient Stated       Check on life alert to reduce anxiety about staying home alone         Depression Screen PHQ 2/9 Scores 04/23/2021 10/31/2020 10/31/2020 06/15/2020 03/20/2020 03/18/2019 10/05/2018  PHQ - 2 Score 2 2 0 _0 PHQ- 9 Score 6 14 - - _1 Fall Risk Fall Risk  04/23/2021 10/31/2020 10/18/2020 07/06/2020 06/15/2020  Falls in the past year? 0 0 0 0 0  Number falls in past yr: 0 - 0 - -  Injury with Fall? 0 - 0 - -  Risk for fall due to : - - No Fall Risks - -  Follow up - - Falls evaluation completed - -    FALL RISK PREVENTION PERTAINING TO THE HOME:  Any stairs in or around the home? No  If so, are there any without handrails? No  Home free of loose throw rugs in walkways, pet beds, electrical cords, etc? Yes  Adequate lighting in your home to reduce risk of falls? Yes   ASSISTIVE DEVICES UTILIZED TO PREVENT FALLS:  Life alert? No  Use of a cane, walker or w/c? No  Grab bars in the bathroom? Yes  Shower chair or bench in shower? Yes  Elevated toilet seat or a handicapped toilet? No   TIMED UP AND GO:  Was the test performed? No .  Length of time to ambulate 10  feet:  sec.     Cognitive Function:     6CIT Screen 04/23/2021 03/20/2020 03/18/2019 03/11/2018 02/12/2017  What Year? 0 points 0 points 0 points 0 points 0 points  What month? 0 points 0 points 0 points 0 points 0 points  What time? 0 points 0 points 0 points 0 points 0 points  Count back from 20 0 points 0 points 0 points 0 points 0 points  Months in reverse 0 points 0 points 0 points 0 points 0 points  Repeat phrase 0 points 0 points 0 points 0 points 0 points  Total Score 0 0 0 0 0    Immunizations Immunization History  Administered Date(s) Administered   H1N1 10/24/2008   Influenza Whole 09/24/2007, 08/09/2010   Influenza,inj,Quad PF,6+ Mos 07/29/2013, 10/18/2014, 08/29/2015, 09/23/2016, 07/16/2017, 10/05/2018, 07/26/2019, 07/06/2020   Influenza-Unspecified 08/08/2017, 10/07/2018   Moderna Sars-Covid-2 Vaccination 03/01/2020, 04/04/2020, 11/25/2020   Pneumococcal Polysaccharide-23 03/19/2013, 06/02/2018   Td 04/11/2004   Tdap 06/16/2014    TDAP status: Up to date  Flu Vaccine status: Up to date  Pneumococcal vaccine status: Up to date  Covid-19 vaccine status: Completed vaccines  Qualifies for Shingles Vaccine? Yes   Zostavax completed No   Shingrix Completed?: No.    Education has been provided regarding the importance of this vaccine. Patient has been advised to call insurance company to determine out of pocket expense if they have not yet received this vaccine. Advised may also receive vaccine at local pharmacy or Health Dept. Verbalized acceptance and understanding.  Screening Tests Health Maintenance  Topic Date Due   Zoster Vaccines- Shingrix (1 of 2) Never done   COVID-19 Vaccine (4 - Booster for Moderna series) 03/25/2021   PAP SMEAR-Modifier  06/02/2021   INFLUENZA VACCINE  06/11/2021   HEMOGLOBIN A1C  08/28/2021   MAMMOGRAM  10/13/2021   FOOT EXAM  10/31/2021   OPHTHALMOLOGY EXAM  01/09/2022   TETANUS/TDAP  06/16/2024   COLONOSCOPY (Pts 45-53yr Insurance  coverage will need to be confirmed)  05/09/2026   PNEUMOCOCCAL POLYSACCHARIDE VACCINE AGE 99-64 HIGH RISK  Completed   Hepatitis C Screening  Completed   HIV Screening  Completed   Pneumococcal Vaccine [id number] old  Aged Out   [place] Due  Topic Date Due   Zoster Vaccines- Shingrix (1 of 2) Never done   COVID-19 Vaccine (4 - Booster for Moderna series) 03/25/2021   PAP SMEAR-Modifier  06/02/2021    Colorectal cancer screening: Type of screening: Colonoscopy. Completed 2017. Repeat every 10 years  Mammogram status: Ordered today. Pt provided with contact info and advised to call to schedule appt.     Lung Cancer Screening: (Low Dose CT Chest recommended if Age 54-80years, 30 pack-year currently smoking OR have quit w/in 15years.) does not qualify.   Lung Cancer Screening Referral: does not qualify  Additional Screening:  Hepatitis C Screening: does qualify; Completed   Vision Screening: Recommended annual ophthalmology exams for early detection of glaucoma and other disorders of the eye. Is the patient up to date with their annual eye exam?  Yes  Who  is the provider or what is the name of the office in which the patient attends annual eye exams? Dr Tye Savoy  If pt is not established with a provider, would they like to be referred to a provider to establish care? No .   Dental Screening: Recommended annual dental exams for proper oral hygiene  Community Resource Referral / Chronic Care Management: CRR required this visit?  No   CCM required this visit?  No      Plan:     I have personally reviewed and noted the following in the patient's chart:   Medical and social history Use of alcohol, tobacco or illicit drugs  Current medications and supplements including opioid prescriptions.  Functional ability and status Nutritional status Physical activity Advanced directives List of other  physicians Hospitalizations, surgeries, and ER visits in previous 12 months Vitals Screenings to include cognitive, depression, and falls Referrals and appointments  In addition, I have reviewed and discussed with patient certain preventive protocols, quality metrics, and best practice recommendations. A written personalized care plan for preventive services as well as general preventive health recommendations were provided to patient.     Kate Sable, LPN, LPN   [id number]/41/[id number]   Nurse Notes: Visit performed by telephone. Patient at home and supervising provider in the office. Patient consents to telephone visit. Time spent with pt 25 mins.

## 2021-04-23 NOTE — Progress Notes (Addendum)
This patient returns to my office for at risk foot care.  This patient requires this care by a professional since this patient will be at risk due to having DM with neuropathy.  This patient is unable to cut nails herself since the patient cannot reach her nails.These nails are painful walking and wearing shoes.  Patient has not been seen I over 7 months. This patient presents for at risk foot care today.  General Appearance  Alert, conversant and in no acute stress.  Vascular  Dorsalis pedis and posterior tibial  pulses are palpable  bilaterally.  Capillary return is within normal limits  bilaterally. Temperature is within normal limits  bilaterally.  Neurologic  Senn-Weinstein monofilament wire test absent   bilaterally. Muscle power within normal limits bilaterally.  Nails Thick disfigured discolored nails with subungual debris  from hallux to fifth toes bilaterally. No evidence of bacterial infection or drainage bilaterally.  Orthopedic  No limitations of motion  feet .  No crepitus or effusions noted.  No bony pathology or digital deformities noted.  HAV  B/L.  Skin  normotropic skin with no porokeratosis noted bilaterally.  No signs of infections or ulcers noted.     Onychomycosis  Pain in right toes  Pain in left toes  Consent was obtained for treatment procedures.   Mechanical debridement of nails 1-5  bilaterally performed with a nail nipper.  Filed with dremel without incident.    Return office visit   4 months                   Told patient to return for periodic foot care and evaluation due to potential at risk complications.   Gardiner Barefoot DPM

## 2021-04-23 NOTE — Patient Instructions (Signed)
Ashley Armstrong , Thank you for taking time to come for your Medicare Wellness Visit. I appreciate your ongoing commitment to your health goals. Please review the following plan we discussed and let me know if I can assist you in the future.   Screening recommendations/referrals: Colonoscopy: up to date- due in 2027 Mammogram: ordered- call [phone] to schedule  Recommended yearly ophthalmology/optometry visit for glaucoma screening and checkup Recommended yearly dental visit for hygiene and checkup  Vaccinations: Influenza vaccine: up to date- due Sept 2022 Pneumococcal vaccine: n/a Tdap vaccine: up to date- due 2025 Shingles vaccine: can get at pharmacy if wanted       Conditions/risks identified: diabetes, lack of exercise  Next appointment: Wellness in 1 year  Preventive Care 40-64 Years, Female Preventive care refers to lifestyle choices and visits with your health care provider that can promote health and wellness. What does preventive care include? A yearly physical exam. This is also called an annual well check. Dental exams once or twice a year. Routine eye exams. Ask your health care provider how often you should have your eyes checked. Personal lifestyle choices, including: Daily care of your teeth and gums. Regular physical activity. Eating a healthy diet. Avoiding tobacco and drug use. Limiting alcohol use. Practicing safe sex. Taking low-dose aspirin daily starting at age 71. Taking vitamin and mineral supplements as recommended by your health care provider. What happens during an annual well check? The services and screenings done by your health care provider during your annual well check will depend on your age, overall health, lifestyle risk factors, and family history of disease. Counseling  Your health care provider may ask you questions about your: Alcohol use. Tobacco use. Drug use. Emotional well-being. Home and relationship well-being. Sexual  activity. Eating habits. Work and work Statistician. Method of birth control. Menstrual cycle. Pregnancy history. Screening  You may have the following tests or measurements: Height, weight, and BMI. Blood pressure. Lipid and cholesterol levels. These may be checked every 5 years, or more frequently if you are over 41 years old. Skin check. Lung cancer screening. You may have this screening every year starting at age 32 if you have a 30-pack-year history of smoking and currently smoke or have quit within the past 15 years. Fecal occult blood test (FOBT) of the stool. You may have this test every year starting at age 63. Flexible sigmoidoscopy or colonoscopy. You may have a sigmoidoscopy every 5 years or a colonoscopy every 10 years starting at age 21. Hepatitis C blood test. Hepatitis B blood test. Sexually transmitted disease (STD) testing. Diabetes screening. This is done by checking your blood sugar (glucose) after you have not eaten for a while (fasting). You may have this done every 1-3 years. Mammogram. This may be done every 1-2 years. Talk to your health care provider about when you should start having regular mammograms. This may depend on whether you have a family history of breast cancer. BRCA-related cancer screening. This may be done if you have a family history of breast, ovarian, tubal, or peritoneal cancers. Pelvic exam and Pap test. This may be done every 3 years starting at age 49. Starting at age 34, this may be done every 5 years if you have a Pap test in combination with an HPV test. Bone density scan. This is done to screen for osteoporosis. You may have this scan if you are at high risk for osteoporosis. Discuss your test results, treatment options, and if necessary, the need for  more tests with your health care provider. Vaccines  Your health care provider may recommend certain vaccines, such as: Influenza vaccine. This is recommended every year. Tetanus, diphtheria,  and acellular pertussis (Tdap, Td) vaccine. You may need a Td booster every 10 years. Zoster vaccine. You may need this after age 29. Pneumococcal 13-valent conjugate (PCV13) vaccine. You may need this if you have certain conditions and were not previously vaccinated. Pneumococcal polysaccharide (PPSV23) vaccine. You may need one or two doses if you smoke cigarettes or if you have certain conditions. Talk to your health care provider about which screenings and vaccines you need and how often you need them. This information is not intended to replace advice given to you by your health care provider. Make sure you discuss any questions you have with your health care provider. Document Released: 11/24/2015 Document Revised: 07/17/2016 Document Reviewed: 08/29/2015 Elsevier Interactive Patient Education  2017 [employer] Prevention in the Home Falls can cause injuries. They can happen to people of all ages. There are many things you can do to make your home safe and to help prevent falls. What can I do on the outside of my home? Regularly fix the edges of walkways and driveways and fix any cracks. Remove anything that might make you trip as you walk through a door, such as a raised step or threshold. Trim any bushes or trees on the path to your home. Use bright outdoor lighting. Clear any walking paths of anything that might make someone trip, such as rocks or tools. Regularly check to see if handrails are loose or broken. Make sure that both sides of any steps have handrails. Any raised decks and porches should have guardrails on the edges. Have any leaves, snow, or ice cleared regularly. Use sand or salt on walking paths during winter. Clean up any spills in your garage right away. This includes oil or grease spills. What can I do in the bathroom? Use night lights. Install grab bars by the toilet and in the tub and shower. Do not use towel bars as grab bars. Use non-skid mats or  decals in the tub or shower. If you need to sit down in the shower, use a plastic, non-slip stool. Keep the floor dry. Clean up any water that spills on the floor as soon as it happens. Remove soap buildup in the tub or shower regularly. Attach bath mats securely with double-sided non-slip rug tape. Do not have throw rugs and other things on the floor that can make you trip. What can I do in the bedroom? Use night lights. Make sure that you have a light by your bed that is easy to reach. Do not use any sheets or blankets that are too big for your bed. They should not hang down onto the floor. Have a firm chair that has side arms. You can use this for support while you get dressed. Do not have throw rugs and other things on the floor that can make you trip. What can I do in the kitchen? Clean up any spills right away. Avoid walking on wet floors. Keep items that you use a lot in easy-to-reach places. If you need to reach something above you, use a strong step stool that has a grab bar. Keep electrical cords out of the way. Do not use floor polish or wax that makes floors slippery. If you must use wax, use non-skid floor wax. Do not have throw rugs and other things  on the floor that can make you trip. What can I do with my stairs? Do not leave any items on the stairs. Make sure that there are handrails on both sides of the stairs and use them. Fix handrails that are broken or loose. Make sure that handrails are as long as the stairways. Check any carpeting to make sure that it is firmly attached to the stairs. Fix any carpet that is loose or worn. Avoid having throw rugs at the top or bottom of the stairs. If you do have throw rugs, attach them to the floor with carpet tape. Make sure that you have a light switch at the top of the stairs and the bottom of the stairs. If you do not have them, ask someone to add them for you. What else can I do to help prevent falls? Wear shoes that: Do not  have high heels. Have rubber bottoms. Are comfortable and fit you well. Are closed at the toe. Do not wear sandals. If you use a stepladder: Make sure that it is fully opened. Do not climb a closed stepladder. Make sure that both sides of the stepladder are locked into place. Ask someone to hold it for you, if possible. Clearly mark and make sure that you can see: Any grab bars or handrails. First and last steps. Where the edge of each step is. Use tools that help you move around (mobility aids) if they are needed. These include: Canes. Walkers. Scooters. Crutches. Turn on the lights when you go into a dark area. Replace any light bulbs as soon as they burn out. Set up your furniture so you have a clear path. Avoid moving your furniture around. If any of your floors are uneven, fix them. If there are any pets around you, be aware of where they are. Review your medicines with your doctor. Some medicines can make you feel dizzy. This can increase your chance of falling. Ask your doctor what other things that you can do to help prevent falls. This information is not intended to replace advice given to you by your health care provider. Make sure you discuss any questions you have with your health care provider. Document Released: 08/24/2009 Document Revised: 04/04/2016 Document Reviewed: 12/02/2014 Elsevier Interactive Patient Education  2017 [employer].

## 2021-04-24 ENCOUNTER — Telehealth (INDEPENDENT_AMBULATORY_CARE_PROVIDER_SITE_OTHER): Payer: PPO | Admitting: Licensed Clinical Social Worker

## 2021-04-24 DIAGNOSIS — F411 Generalized anxiety disorder: Secondary | ICD-10-CM

## 2021-04-24 NOTE — BH Specialist Note (Signed)
[facility]al [facility] Treatment Plan Team Note  MRN: [id number] NAME: Ashley Armstrong  DATE: 04/24/21  Start time:   315End time:  320p Total time:  5 min  Total number of Virtual [place] Treatment Team Plan encounters: 2/4  Treatment Team Attendees: Royal Piedra, LCSW & Dr. Modesta Messing, Psychiatrist  Diagnoses: No diagnosis found.  Goals, Interventions and Follow-up Plan Goals: Increase healthy adjustment to current life circumstances Interventions: Motivational Interviewing Education officer, community Medication Management Recommendations: recommend starting antidepressant for anxiety Follow-up Plan: Continue with [facility]  History of the present illness Presenting Problem/Current Symptoms: symptoms of her diagnosis (anxiety)  Psychiatric History  Depression: No Anxiety: No Mania: No Psychosis: No PTSD symptoms: No  Past Psychiatric History/Hospitalization(s): Hospitalization for psychiatric illness: No Prior Suicide Attempts: No Prior Self-injurious behavior: No   Self-harm Behaviors Risk Assessment   Screenings PHQ-9 Assessments:  Depression screen [facility] 2/9 04/23/2021 10/31/2020 10/31/2020  Decreased Interest 1 1 0  Down, Depressed, Hopeless 1 1 0  PHQ - 2 Score 2 2 0  Altered sleeping 1 3 -  Tired, decreased energy 1 2 -  Change in appetite 1 0 -  Feeling bad or failure about yourself  0 2 -  Trouble concentrating 0 3 -  Moving slowly or fidgety/restless 1 2 -  Suicidal thoughts 0 0 -  PHQ-9 Score 6 14 -  Difficult doing work/chores Somewhat difficult Extremely dIfficult -  Some recent data might be hidden   GAD-7 Assessments:  GAD 7 : Generalized Anxiety Score 04/04/2021 03/21/2021 03/21/2021  Nervous, Anxious, on Edge '3 3 3  ' Control/stop worrying '2 1 2  ' Worry too much - different things '2 1 2  ' Trouble relaxing '2 3 3  ' Restless '2 1 1  ' Easily annoyed or irritable 3 1 0  Afraid - awful might happen '2 3 2  ' Total GAD 7 Score '16 13 13   ' Anxiety Difficulty Very difficult - Somewhat difficult    Past Medical History Past Medical History:  Diagnosis Date   Anxiety    Dyslipidemia    Gastroparesis due to DM ([place]) OCT 2014   75% AT 2 HRS, GLU >    H/O eye surgery 01/2018   Headache(784.0)    IDDM (insulin dependent diabetes mellitus)    Neuropathy    Nicotine addiction    TIA (transient ischemic attack)    Dr. Merlene Laughter    Vital signs: There were no vitals filed for this visit.  Allergies:  Allergies as of 04/24/2021   (No Known Allergies)    Medication History Current medications:  Outpatient Encounter Medications as of 04/24/2021  Medication Sig   benzonatate (TESSALON PERLES) 100 MG capsule Take 1 capsule (100 mg total) by mouth 2 (two) times daily as needed for cough.   Blood Glucose Monitoring Suppl (FREESTYLE PRECISION NEO SYSTEM) w/Device KIT 1 kit by Does not apply route 2 (two) times daily.   Continuous Blood Gluc Receiver (FREESTYLE LIBRE 14 DAY READER) DEVI Use Freestyle Libre reader to monitor blood sugars continuously.   Continuous Blood Gluc Receiver (FREESTYLE LIBRE 2 READER) DEVI 1 each by Does not apply route See admin instructions. Use Freestyle libre 2 reader to monitor blood sugars continuously.   Continuous Blood Gluc Sensor (FREESTYLE LIBRE 2 SENSOR) MISC USE ONE SENSOR ONCE EVERY 14 DAYS TO MONITOR BLOOD SUGARS   diazepam (VALIUM) 10 MG tablet as needed.    DUREZOL 0.05 % EMUL Apply 1 drop to eye daily at 8 pm.  FREESTYLE PRECISION NEO TEST test strip CHECKING BLOOD SUGAR TWICE DAILY.   LANTUS SOLOSTAR 100 UNIT/ML Solostar Pen INJECT 40 UNITS UNDER THE SKIN IN THE MORNING AND 28 UNITS IN THE EVENING   lisinopril (ZESTRIL) 2.5 MG tablet TAKE ONE (1) TABLET BY MOUTH EVERY DAY   metoCLOPramide (REGLAN) 5 MG tablet TAKE ONE TO TWO TABLETS BY MOUTH EVERY SIX HOURS AS NEEDED FOR NAUSEAOR VOMITING   montelukast (SINGULAIR) 10 MG tablet TAKE ONE TABLET (10MG TOTAL) BY MOUTH ATBEDTIME   NOVOLOG  FLEXPEN 100 UNIT/ML FlexPen INJECT 8 TO 12 UNITS SUBCUTANEOUSLY BEFORE EACH MEAL AS DIRECTED. ADJUST PERSLIDING SCALE. MAXIMUM DOSE PER DAY IS 51 UNITS   Omega-3 Fatty Acids (FISH OIL) 1000 MG CAPS Take 1 capsule by mouth daily.   omeprazole (PRILOSEC) 40 MG capsule TAKE ONE CAPSULE BY MOUTH 30 MINUTES PRIOR TO BREAKFAST AND SUPPER   ondansetron (ZOFRAN) 4 MG tablet TAKE ONE TABLET BY MOUTH EVERY EIGHT HOURS AS NEEDED FOR NAUSEA OR VOMITING   ONETOUCH DELICA LANCETS 02V MISC USE FOUR TIMES A DAY AS DIRECTED.   polyethylene glycol (MIRALAX / GLYCOLAX) 17 g packet Take 17 g by mouth daily as needed.   rosuvastatin (CRESTOR) 40 MG tablet Take 1 tablet (40 mg total) by mouth daily.   SPS 15 GM/60ML suspension Take by mouth.   SURE COMFORT PEN NEEDLES 30G X 8 MM MISC 4 boxes (88day supply)   vitamin B-12 (CYANOCOBALAMIN) 100 MCG tablet Take 1 tablet by mouth daily.   VITAMIN D PO Take 1 Dose by mouth daily.   No facility-administered encounter medications on file as of 04/24/2021.     Scribe for Treatment Team: Lubertha South, LCSW

## 2021-04-24 NOTE — Progress Notes (Signed)
[place] Follow Up Assessment  MRN: [id number] NAME: GIAVONNA PFLUM Date: 04/24/21  Start time: 130p End time: 150p Total time: 20  Type of Contact: Follow up Call  Current concerns/stressors: anxiousness  Screens/Assessment Tools:  PHQ-9 & GAD-7 Assessments: This is an evidence based assessment tool for depression and anxiety symptoms in adolescents and adults.  Score cut-off points for each section are as follows: 5-9: Mild, 10-14: Moderate, 15+: Severe  PHQ-9 for Depression = 10   GAD-7 for Anxiety = 15   How difficult have these problems made it for you to do your work, take care of things at home, or get along with other people? Somewhat difficult  Functional Assessment:  Sleep: fair Appetite: fair Coping ability: overwhelmed Patient taking medications as prescribed:  yes  Current medications:  Outpatient Encounter Medications as of 04/19/2021  Medication Sig   benzonatate (TESSALON PERLES) 100 MG capsule Take 1 capsule (100 mg total) by mouth 2 (two) times daily as needed for cough.   Blood Glucose Monitoring Suppl (FREESTYLE PRECISION NEO SYSTEM) w/Device KIT 1 kit by Does not apply route 2 (two) times daily.   Continuous Blood Gluc Receiver (FREESTYLE LIBRE 14 DAY READER) DEVI Use Freestyle Libre reader to monitor blood sugars continuously.   Continuous Blood Gluc Receiver (FREESTYLE LIBRE 2 READER) DEVI 1 each by Does not apply route See admin instructions. Use Freestyle libre 2 reader to monitor blood sugars continuously.   Continuous Blood Gluc Sensor (FREESTYLE LIBRE 2 SENSOR) MISC USE ONE SENSOR ONCE EVERY 14 DAYS TO MONITOR BLOOD SUGARS   diazepam (VALIUM) 10 MG tablet as needed.    DUREZOL 0.05 % EMUL Apply 1 drop to eye daily at 8 pm.   FREESTYLE PRECISION NEO TEST test strip CHECKING BLOOD SUGAR TWICE DAILY.   LANTUS SOLOSTAR 100 UNIT/ML Solostar Pen INJECT 40 UNITS UNDER THE SKIN IN THE MORNING AND 28 UNITS IN THE EVENING   lisinopril (ZESTRIL)  2.5 MG tablet TAKE ONE (1) TABLET BY MOUTH EVERY DAY   metoCLOPramide (REGLAN) 5 MG tablet TAKE ONE TO TWO TABLETS BY MOUTH EVERY SIX HOURS AS NEEDED FOR NAUSEAOR VOMITING   montelukast (SINGULAIR) 10 MG tablet TAKE ONE TABLET (10MG TOTAL) BY MOUTH ATBEDTIME   NOVOLOG FLEXPEN 100 UNIT/ML FlexPen INJECT 8 TO 12 UNITS SUBCUTANEOUSLY BEFORE EACH MEAL AS DIRECTED. ADJUST PERSLIDING SCALE. MAXIMUM DOSE PER DAY IS 51 UNITS   Omega-3 Fatty Acids (FISH OIL) 1000 MG CAPS Take 1 capsule by mouth daily.   omeprazole (PRILOSEC) 40 MG capsule TAKE ONE CAPSULE BY MOUTH 30 MINUTES PRIOR TO BREAKFAST AND SUPPER   ondansetron (ZOFRAN) 4 MG tablet TAKE ONE TABLET BY MOUTH EVERY EIGHT HOURS AS NEEDED FOR NAUSEA OR VOMITING   ONETOUCH DELICA LANCETS 70J MISC USE FOUR TIMES A DAY AS DIRECTED.   polyethylene glycol (MIRALAX / GLYCOLAX) 17 g packet Take 17 g by mouth daily as needed.   rosuvastatin (CRESTOR) 40 MG tablet Take 1 tablet (40 mg total) by mouth daily.   SURE COMFORT PEN NEEDLES 30G X 8 MM MISC 4 boxes (88day supply)   vitamin B-12 (CYANOCOBALAMIN) 100 MCG tablet Take 1 tablet by mouth daily.   VITAMIN D PO Take 1 Dose by mouth daily.   No facility-administered encounter medications on file as of 04/19/2021.    Self-harm and/or Suicidal Behaviors Risk Assessment Self-harm risk factors: n/a Patient endorses recent self injurious thoughts and/or behaviors: No   Suicide ideations: No plan to harm self or  others   Danger to Others Risk Assessment Danger to others risk factors: n/a Patient endorses recent thoughts of harming others: No    Substance Use Assessment Patient recently consumed alcohol: No  Patient recently used drugs: No  Patient is concerned about dependence or abuse of substances: No    Goals, Interventions and Follow-up Plan Goals: Increase healthy adjustment to current life circumstances Interventions: Motivational Interviewing, Mindfulness or Psychologist, educational, and Behavioral  Activation   Summary of Clinical Assessment  Mattia is a 55 year old woman that was referred by her PCP, Dr. Moshe Cipro.  Patient continues to have difficulty with being in her home by herself so she frequents her mother's home.  She had a roommate that was helpful and escorted her to work and made sure she was ok.  Since he died, she has struggled with anxiety and some depression.  At the first thought of any uneasy feelings, she retreats to her mother's home and a few minutes later the feelings go away.  Even though the anxiousness has passed she will sleep at her mother's home.  Most recently her sister went on vacation and she was left to tend to the animals (dogs) where she has made minor improvement with being alone.  She has little to no friends.  When asked about friendships, it took her some time to think about social engagements and encounters.  She stated that she has a church friend and she engages with only at church.  Discussion of social engagements such as church meeting, going for coffee and other small task to improve social encounters.  Also discussed positive self talk to assist with being alone.Will follow up    Follow-up Plan:  Continue with [facility] Lubertha South, LCSW

## 2021-04-30 ENCOUNTER — Other Ambulatory Visit: Payer: Self-pay | Admitting: Endocrinology

## 2021-05-10 ENCOUNTER — Telehealth (INDEPENDENT_AMBULATORY_CARE_PROVIDER_SITE_OTHER): Payer: PPO | Admitting: Licensed Clinical Social Worker

## 2021-05-10 ENCOUNTER — Other Ambulatory Visit: Payer: Self-pay

## 2021-05-10 DIAGNOSIS — F411 Generalized anxiety disorder: Secondary | ICD-10-CM

## 2021-05-11 ENCOUNTER — Other Ambulatory Visit: Payer: Self-pay | Admitting: Family Medicine

## 2021-05-11 NOTE — Progress Notes (Signed)
[facility] Follow Up Assessment  MRN: [id number] NAME: Ashley Armstrong Date: 05/11/21  Start time: 59p End time: 250p Total time: 20  Type of Contact: Follow up Call  Current concerns/stressors: being alone, anxiouseness  Screens/Assessment Tools:  PHQ-9 & GAD-7 Assessments: This is an evidence based assessment tool for depression and anxiety symptoms in adolescents and adults.  Score cut-off points for each section are as follows: 5-9: Mild, 10-14: Moderate, 15+: Severe  PHQ-9 for Depression = 8   GAD-7 for Anxiety = 12   How difficult have these problems made it for you to do your work, take care of things at home, or get along with other people? difficult  Functional Assessment:  Sleep: WNL  Appetite: poor Coping ability: overwhelmed Patient taking medications as prescribed:    Current medications:  Outpatient Encounter Medications as of 05/10/2021  Medication Sig   benzonatate (TESSALON PERLES) 100 MG capsule Take 1 capsule (100 mg total) by mouth 2 (two) times daily as needed for cough.   Blood Glucose Monitoring Suppl (FREESTYLE PRECISION NEO SYSTEM) w/Device KIT 1 kit by Does not apply route 2 (two) times daily.   Continuous Blood Gluc Receiver (FREESTYLE LIBRE 14 DAY READER) DEVI Use Freestyle Libre reader to monitor blood sugars continuously.   Continuous Blood Gluc Receiver (FREESTYLE LIBRE 2 READER) DEVI 1 each by Does not apply route See admin instructions. Use Freestyle libre 2 reader to monitor blood sugars continuously.   Continuous Blood Gluc Sensor (FREESTYLE LIBRE 2 SENSOR) MISC USE ONE SENSOR ONCE EVERY 14 DAYS TO MONITOR BLOOD SUGARS   diazepam (VALIUM) 10 MG tablet as needed.    DUREZOL 0.05 % EMUL Apply 1 drop to eye daily at 8 pm.   FREESTYLE PRECISION NEO TEST test strip CHECKING BLOOD SUGAR TWICE DAILY.   insulin aspart (NOVOLOG FLEXPEN) 100 UNIT/ML FlexPen INJECT 8 TO 12 UNITS SUBCUTANEOUSLY BEFORE EACH MEAL AS DIRECTED. ADJUST PERSLIDING  SCALE TO A MAXIMUM DOSE PER DAY OF 51 UNITS.   LANTUS SOLOSTAR 100 UNIT/ML Solostar Pen INJECT 40 UNITS UNDER THE SKIN IN THE MORNING AND 28 UNITS IN THE EVENING   lisinopril (ZESTRIL) 2.5 MG tablet TAKE ONE (1) TABLET BY MOUTH EVERY DAY   metoCLOPramide (REGLAN) 5 MG tablet TAKE ONE TO TWO TABLETS BY MOUTH EVERY SIX HOURS AS NEEDED FOR NAUSEAOR VOMITING   montelukast (SINGULAIR) 10 MG tablet TAKE ONE TABLET (10MG TOTAL) BY MOUTH ATBEDTIME   Omega-3 Fatty Acids (FISH OIL) 1000 MG CAPS Take 1 capsule by mouth daily.   omeprazole (PRILOSEC) 40 MG capsule TAKE ONE CAPSULE BY MOUTH 30 MINUTES PRIOR TO BREAKFAST AND SUPPER   ondansetron (ZOFRAN) 4 MG tablet TAKE ONE TABLET BY MOUTH EVERY EIGHT HOURS AS NEEDED FOR NAUSEA OR VOMITING   ONETOUCH DELICA LANCETS 09N MISC USE FOUR TIMES A DAY AS DIRECTED.   polyethylene glycol (MIRALAX / GLYCOLAX) 17 g packet Take 17 g by mouth daily as needed.   rosuvastatin (CRESTOR) 40 MG tablet Take 1 tablet (40 mg total) by mouth daily.   SPS 15 GM/60ML suspension Take by mouth.   SURE COMFORT PEN NEEDLES 30G X 8 MM MISC 4 boxes (88day supply)   vitamin B-12 (CYANOCOBALAMIN) 100 MCG tablet Take 1 tablet by mouth daily.   VITAMIN D PO Take 1 Dose by mouth daily.   No facility-administered encounter medications on file as of 05/10/2021.    Self-harm and/or Suicidal Behaviors Risk Assessment Self-harm risk factors: no Patient endorses recent self  injurious thoughts and/or behaviors: No   Suicide ideations: No plan to harm self or others   Danger to Others Risk Assessment Danger to others risk factors: no Patient endorses recent thoughts of harming others: No    Substance Use Assessment Patient recently consumed alcohol: No  Patient recently used drugs: No  Patient is concerned about dependence or abuse of substances: No    Goals, Interventions and Follow-up Plan Goals: Increase healthy adjustment to current life circumstances Interventions: Mindfulness  or Relaxation Training   Summary of Clinical Assessment  Ashley Armstrong presented today with continued anxiety.  She reports that she has been attempting techniques discussed in previous sessions such as: positive self talk and grounding techniques.  Ashley Armstrong is currently not immediately acting on each anxious behavior and will self talk to calm down.     Follow-up Plan:  weekly [facility] sessions Ashley South, LCSW

## 2021-05-16 ENCOUNTER — Telehealth: Payer: Self-pay | Admitting: Licensed Clinical Social Worker

## 2021-05-16 DIAGNOSIS — F411 Generalized anxiety disorder: Secondary | ICD-10-CM

## 2021-05-16 DIAGNOSIS — F322 Major depressive disorder, single episode, severe without psychotic features: Secondary | ICD-10-CM

## 2021-05-16 NOTE — BH Specialist Note (Signed)
Virtual Behavioral Health Treatment Plan Team Note  MRN: [id number] NAME: Ashley Armstrong  DATE: 05/22/21  Start time:  420p End time:  425p Total time:  5 min  Total number of Virtual [facility] Treatment Team Plan encounters: 1/4  Treatment Team Attendees: Royal Piedra, LCSW; Dr. Modesta Messing, Psychiatrist  Diagnoses:    ICD-10-CM   1. Generalized anxiety disorder  F41.1     2. Depression, major, single episode, severe (HCC)  F32.2       Goals, Interventions and Follow-up Plan Goals: Increase healthy adjustment to current life circumstances Interventions: Mindfulness or Relaxation Training Medication Management Recommendations: n/a Follow-up Plan: weekly VBH sessions  History of the present illness Presenting Problem/Current Symptoms: continued symptoms of his diagnosis  Psychiatric History  Depression: No Anxiety: No Mania: No Psychosis: No PTSD symptoms: No  Past Psychiatric History/Hospitalization(s): Hospitalization for psychiatric illness: No Prior Suicide Attempts: No Prior Self-injurious behavior: No  Psychosocial stressors fear of loneliness  Self-harm Behaviors Risk Assessment   Screenings PHQ-9 Assessments:  Depression screen [facility] 2/9 04/23/2021 10/31/2020 10/31/2020  Decreased Interest 1 1 0  Down, Depressed, Hopeless 1 1 0  PHQ - 2 Score 2 2 0  Altered sleeping 1 3 -  Tired, decreased energy 1 2 -  Change in appetite 1 0 -  Feeling bad or failure about yourself  0 2 -  Trouble concentrating 0 3 -  Moving slowly or fidgety/restless 1 2 -  Suicidal thoughts 0 0 -  PHQ-9 Score 6 14 -  Difficult doing work/chores Somewhat difficult Extremely dIfficult -  Some recent data might be hidden   GAD-7 Assessments:  GAD 7 : Generalized Anxiety Score 04/04/2021 03/21/2021 03/21/2021  Nervous, Anxious, on Edge _0 Control/stop worrying _1 Worry too much - different things _2 Trouble relaxing _3 Restless _4 Easily annoyed or irritable 3 1 0   Afraid - awful might happen _5 Total GAD 7 Score _6 Anxiety Difficulty Very difficult - Somewhat difficult    Past Medical History Past Medical History:  Diagnosis Date   Anxiety    Dyslipidemia    Gastroparesis due to DM ([place]) OCT 2014   75% AT 2 HRS, GLU >    H/O eye surgery 01/2018   Headache(784.0)    IDDM (insulin dependent diabetes mellitus)    Neuropathy    Nicotine addiction    TIA (transient ischemic attack)    Dr. Merlene Laughter    Vital signs: There were no vitals filed for this visit.  Allergies:  Allergies as of 05/16/2021   (No Known Allergies)    Medication History Current medications:  Outpatient Encounter Medications as of 05/16/2021  Medication Sig   benzonatate (TESSALON PERLES) 100 MG capsule Take 1 capsule (100 mg total) by mouth 2 (two) times daily as needed for cough.   Blood Glucose Monitoring Suppl (FREESTYLE PRECISION NEO SYSTEM) w/Device KIT 1 kit by Does not apply route 2 (two) times daily.   Continuous Blood Gluc Receiver (FREESTYLE LIBRE 14 DAY READER) DEVI Use Freestyle Libre reader to monitor blood sugars continuously.   Continuous Blood Gluc Receiver (FREESTYLE LIBRE 2 READER) DEVI 1 each by Does not apply route See admin instructions. Use Freestyle libre 2 reader to monitor blood sugars continuously.   Continuous Blood Gluc Sensor (FREESTYLE LIBRE 2 SENSOR) MISC USE ONE SENSOR ONCE EVERY 14 DAYS TO MONITOR BLOOD SUGARS  diazepam (VALIUM) 10 MG tablet as needed.    DUREZOL 0.05 % EMUL Apply 1 drop to eye daily at 8 pm.   FREESTYLE PRECISION NEO TEST test strip CHECKING BLOOD SUGAR TWICE DAILY.   insulin aspart (NOVOLOG FLEXPEN) 100 UNIT/ML FlexPen INJECT 8 TO 12 UNITS SUBCUTANEOUSLY BEFORE EACH MEAL AS DIRECTED. ADJUST PERSLIDING SCALE TO A MAXIMUM DOSE PER DAY OF 51 UNITS.   LANTUS SOLOSTAR 100 UNIT/ML Solostar Pen INJECT 40 UNITS UNDER THE SKIN IN THE MORNING AND 28 UNITS IN THE EVENING   lisinopril (ZESTRIL) 2.5 MG tablet TAKE ONE  (1) TABLET BY MOUTH EVERY DAY   metoCLOPramide (REGLAN) 5 MG tablet TAKE ONE TO TWO TABLETS BY MOUTH EVERY SIX HOURS AS NEEDED FOR NAUSEAOR VOMITING   montelukast (SINGULAIR) 10 MG tablet TAKE ONE TABLET (10MG TOTAL) BY MOUTH ATBEDTIME   Omega-3 Fatty Acids (FISH OIL) 1000 MG CAPS Take 1 capsule by mouth daily.   omeprazole (PRILOSEC) 40 MG capsule TAKE ONE CAPSULE BY MOUTH 30 MINUTES PRIOR TO BREAKFAST AND SUPPER   ondansetron (ZOFRAN) 4 MG tablet TAKE ONE TABLET BY MOUTH EVERY EIGHT HOURS AS NEEDED FOR NAUSEA OR VOMITING   ONETOUCH DELICA LANCETS 51G MISC USE FOUR TIMES A DAY AS DIRECTED.   polyethylene glycol (MIRALAX / GLYCOLAX) 17 g packet Take 17 g by mouth daily as needed.   rosuvastatin (CRESTOR) 40 MG tablet Take 1 tablet (40 mg total) by mouth daily.   SPS 15 GM/60ML suspension Take by mouth.   SURE COMFORT PEN NEEDLES 30G X 8 MM MISC 4 boxes (88day supply)   vitamin B-12 (CYANOCOBALAMIN) 100 MCG tablet Take 1 tablet by mouth daily.   VITAMIN D PO Take 1 Dose by mouth daily.   No facility-administered encounter medications on file as of 05/16/2021.     Scribe for Treatment Team: Lubertha South, LCSW

## 2021-05-17 ENCOUNTER — Telehealth (INDEPENDENT_AMBULATORY_CARE_PROVIDER_SITE_OTHER): Payer: PPO | Admitting: Licensed Clinical Social Worker

## 2021-05-17 ENCOUNTER — Other Ambulatory Visit: Payer: Self-pay

## 2021-05-17 DIAGNOSIS — F411 Generalized anxiety disorder: Secondary | ICD-10-CM

## 2021-05-24 ENCOUNTER — Other Ambulatory Visit: Payer: Self-pay | Admitting: Endocrinology

## 2021-05-31 ENCOUNTER — Ambulatory Visit: Payer: PPO | Admitting: Endocrinology

## 2021-06-13 ENCOUNTER — Other Ambulatory Visit: Payer: Self-pay | Admitting: Endocrinology

## 2021-06-18 ENCOUNTER — Ambulatory Visit (HOSPITAL_COMMUNITY): Admission: RE | Admit: 2021-06-18 | Payer: PPO | Source: Ambulatory Visit

## 2021-06-18 ENCOUNTER — Other Ambulatory Visit: Payer: Self-pay

## 2021-06-29 ENCOUNTER — Ambulatory Visit (HOSPITAL_COMMUNITY)
Admission: RE | Admit: 2021-06-29 | Discharge: 2021-06-29 | Disposition: A | Discharge status: Home / Self Care | Payer: PPO | Source: Ambulatory Visit | Attending: Family Medicine | Admitting: Family Medicine

## 2021-06-29 ENCOUNTER — Other Ambulatory Visit: Payer: Self-pay

## 2021-06-29 DIAGNOSIS — Z1231 Encounter for screening mammogram for malignant neoplasm of breast: Secondary | ICD-10-CM | POA: Diagnosis not present

## 2021-07-03 ENCOUNTER — Encounter: Payer: PPO | Admitting: Family Medicine

## 2021-07-10 ENCOUNTER — Other Ambulatory Visit: Payer: Self-pay

## 2021-07-10 ENCOUNTER — Encounter: Payer: Self-pay | Admitting: Endocrinology

## 2021-07-10 ENCOUNTER — Ambulatory Visit (INDEPENDENT_AMBULATORY_CARE_PROVIDER_SITE_OTHER): Payer: PPO | Admitting: Endocrinology

## 2021-07-10 VITALS — BP 112/70 | HR 78 | Ht 68.0 in | Wt 127.2 lb

## 2021-07-10 DIAGNOSIS — E782 Mixed hyperlipidemia: Secondary | ICD-10-CM | POA: Diagnosis not present

## 2021-07-10 DIAGNOSIS — Z794 Long term (current) use of insulin: Secondary | ICD-10-CM

## 2021-07-10 DIAGNOSIS — E1065 Type 1 diabetes mellitus with hyperglycemia: Secondary | ICD-10-CM | POA: Diagnosis not present

## 2021-07-10 DIAGNOSIS — E1140 Type 2 diabetes mellitus with diabetic neuropathy, unspecified: Secondary | ICD-10-CM

## 2021-07-10 LAB — COMPREHENSIVE METABOLIC PANEL
ALT: 24 U/L (ref 0–35)
AST: 24 U/L (ref 0–37)
Albumin: 4.4 g/dL (ref 3.5–5.2)
Alkaline Phosphatase: 80 U/L (ref 39–117)
BUN: 15 mg/dL (ref 6–23)
CO2: 27 mEq/L (ref 19–32)
Calcium: 9.7 mg/dL (ref 8.4–10.5)
Chloride: 104 mEq/L (ref 96–112)
Creatinine, Ser: 1.04 mg/dL (ref 0.40–1.20)
GFR: 61.24 mL/min (ref >60.00)
Glucose, Bld: 225 mg/dL — ABNORMAL HIGH (ref 70–99)
Potassium: 4.4 mEq/L (ref 3.5–5.1)
Sodium: 139 mEq/L (ref 135–145)
Total Bilirubin: 0.5 mg/dL (ref 0.2–1.2)
Total Protein: 7.4 g/dL (ref 6.0–8.3)

## 2021-07-10 LAB — LIPID PANEL
Cholesterol: 150 mg/dL (ref 0–200)
HDL: 64 mg/dL (ref >39.00)
LDL Cholesterol: 70 mg/dL (ref 0–99)
NonHDL: 85.56
Total CHOL/HDL Ratio: 2
Triglycerides: 78 mg/dL (ref 0.0–149.0)
VLDL: 15.6 mg/dL (ref 0.0–40.0)

## 2021-07-10 LAB — POCT GLUCOSE (DEVICE FOR HOME USE): Glucose Fasting, POC: 256 mg/dL — AB (ref 70–99)

## 2021-07-10 LAB — POCT GLYCOSYLATED HEMOGLOBIN (HGB A1C): Hemoglobin A1C: 8.6 % — AB (ref 4.0–5.6)

## 2021-07-10 MED ORDER — TOUJEO MAX SOLOSTAR 300 UNIT/ML ~~LOC~~ SOPN: PEN_INJECTOR | SUBCUTANEOUS | 3 refills | Status: DC

## 2021-07-10 NOTE — Patient Instructions (Addendum)
Lantus 48 units in am and reduce pm dose by 4 units If sugar at SUPPER STAYS over 150 go up 4 more on am Lantus  TOUEO DOSES WILL be 4 MORE UNITS THAN Lantus  Always take Novolog 4-6 units on waking up and then 14 at [place] 14-16 AT LUNCH  GI DOCTOR: Dr Rhina Brackett

## 2021-07-10 NOTE — Progress Notes (Signed)
Patient ID: Ashley Armstrong, female   DOB: 1966-11-02, 55 y.o.   MRN: [id number]           Reason for Appointment: Follow-up for Type 1 Diabetes  Referring physician: Dr. [name]   History of Present Illness:          Date of diagnosis of type 1 diabetes mellitus: Age 78       Background history:  She has been on insulin practically since her diagnosis was made.  Initially she had symptoms of weight loss and blurred vision For several years has been on Lantus and NovoLog but usually has had poor control with A1c mostly between 8-10%  Recent history:     INSULIN regimen is:  Lantus 44 units a.m., 32 units p.m .  NovoLog at mealtimes mostly 8-14 units    Current management, blood sugar patterns and problems identified:  Her A1c is slightly better at 8.6, previously 8.9 compared to 8.5    [employer] interpretation is discussed below and appears to be having an accurate sensor when compared to the fingerstick glucose in the office today  she has increased her morning Lantus but we have increased her evening Lantus also on her last visit  She still has not switched to Toujeo as recommended  As before her blood sugars are rising when she is waking up even when she is not eating  However is not taking her NovoLog consistently in the morning as she sometimes does not eat  Also is despite her blood sugars being the highest around 10 AM she has not increased her breakfast NovoLog dose  Also likely not getting enough Lantus since suppertime readings are also over 200 average  Now with her higher dose of Lantus she is having periodically low readings early morning  As before is not able to do much exercise  Weight is down 3 pounds likely to be from her inadequate appetite and nausea  Previously had refused an insulin pump           Meal times are:  Breakfast is 7 AM at Lunch:  PM dinner:3 PM  Typical meal intake: Breakfast is sometimes high fat but otherwise eggs  and toast.  Lunch usually a mixed meal and evening meal may be a sandwich with bland food.  Snacks will be cheese and crackers Usually avoiding drinks with sugar            Exercise: None       Blood Glucose readings by time of day and averages from download   CONTINUOUS GLUCOSE MONITORING RECORD INTERPRETATION    Dates of Recording: Last 2 weeks  Sensor description: Libre 2  Glycemic patterns summary: Blood sugars are rising significantly between 6 AM-10 AM although has moderate variability and then continue to be relatively high until at least midnight when they are decreasing to the lowest level around 6 AM with some hypoglycemia  POSTPRANDIAL readings are markedly increased after breakfast, relatively flat after lunch and dinner overall  Premeal blood sugars are variably high at breakfast and does show a dawn phenomenon  Premeal readings at lunch are the highest and relatively better but still around 200 at dinnertime  Overnight readings as above are decreasing progressively and has variable hypoglycemia with lowest readings usually around 6 AM    Results statistics:  CGM use % of time  96  2-week average/GV 211  Time in range     29   %  %  Time Above 180 33+28  % Time above 250    % Time Below 70  1     PRE-MEAL Fasting Lunch Dinner Bedtime Overall  Glucose range:       Averages: 183 230 208  211   POST-MEAL PC Breakfast PC Lunch PC Dinner  Glucose range:     Averages: 256 242 195      Dietician visit, most recent: 11/18  Weight history:  Wt Readings from Last 3 Encounters:  07/10/21 127 lb 3.2 oz (57.7 kg)  04/23/21 130 lb (59 kg)  03/21/21 130 lb (59 kg)    Glycemic control:   Lab Results  Component Value Date   HGBA1C 8.6 (A) 07/10/2021   HGBA1C 8.9 (A) 02/26/2021   HGBA1C 8.5 (A) 11/01/2020   Lab Results  Component Value Date   MICROALBUR 3.2 (H) 02/26/2021   LDLCALC 70 07/10/2021   CREATININE 1.04 07/10/2021   Lab Results  Component Value  Date   MICRALBCREAT 7.2 02/26/2021    No results found for: FRUCTOSAMINE  Office Visit on 07/10/2021  Component Date Value Ref Range Status   Hemoglobin A1C 07/10/2021 8.6 (A) 4.0 - 5.6 % Corrected   Glucose Fasting, POC 07/10/2021 256 (A) 70 - 99 mg/dL Final   Cholesterol 07/10/2021 150  0 - 200 mg/dL Final   ATP III Classification       Desirable:  < 200 mg/dL               Borderline High:  200 - 239 mg/dL          High:  > = 240 mg/dL   Triglycerides 07/10/2021 78.0  0.0 - 149.0 mg/dL Final   Normal:  <150 mg/dLBorderline High:  150 - 199 mg/dL   HDL 07/10/2021 64.00  >39.00 mg/dL Final   VLDL 07/10/2021 15.6  0.0 - 40.0 mg/dL Final   LDL Cholesterol 07/10/2021 70  0 - 99 mg/dL Final   Total CHOL/HDL Ratio 07/10/2021 2   Final                  Men          Women1/2 Average Risk     3.4          3.3Average Risk          5.0          4.42X Average Risk          9.6          7.13X Average Risk          15.0          11.0                       NonHDL 07/10/2021 85.56   Final   NOTE:  Non-HDL goal should be 30 mg/dL higher than patient's LDL goal (i.e. LDL goal of < 70 mg/dL, would have non-HDL goal of < 100 mg/dL)   Sodium 07/10/2021 139  135 - 145 mEq/L Final   Potassium 07/10/2021 4.4  3.5 - 5.1 mEq/L Final   Chloride 07/10/2021 104  96 - 112 mEq/L Final   CO2 07/10/2021 27  19 - 32 mEq/L Final   Glucose, Bld 07/10/2021 225 (A) 70 - 99 mg/dL Final   BUN 07/10/2021 15  6 - 23 mg/dL Final   Creatinine, Ser 07/10/2021 1.04  0.40 - 1.20 mg/dL Final   Total Bilirubin 07/10/2021 0.5  0.2 -  1.2 mg/dL Final   Alkaline Phosphatase 07/10/2021 80  39 - 117 U/L Final   AST 07/10/2021 24  0 - 37 U/L Final   ALT 07/10/2021 24  0 - 35 U/L Final   Total Protein 07/10/2021 7.4  6.0 - 8.3 g/dL Final   Albumin 07/10/2021 4.4  3.5 - 5.2 g/dL Final   GFR 07/10/2021 61.24  >60.00 mL/min Final   Calculated using the CKD-EPI Creatinine Equation (2021)   Calcium 07/10/2021 9.7  8.4 - 10.5 mg/dL Final     Allergies as of 07/10/2021   No Known Allergies      Medication List        Accurate as of July 10, 2021  4:14 PM. If you have any questions, ask your nurse or doctor.          benzonatate 100 MG capsule Commonly known as: Tessalon Perles Take 1 capsule (100 mg total) by mouth 2 (two) times daily as needed for cough.   diazepam 10 MG tablet Commonly known as: VALIUM as needed.   Durezol 0.05 % Emul Generic drug: Difluprednate Apply 1 drop to eye daily at 8 pm.   Fish Oil 1000 MG Caps Take 1 capsule by mouth daily.   FreeStyle Libre 2 Reader [place] 1 each by Does not apply route See admin instructions. Use Freestyle libre 2 reader to monitor blood sugars continuously.   FreeStyle Libre 14 Day Reader [place] reader to monitor blood sugars continuously.   FreeStyle Libre 2 Sensor Misc USE ONE SENSOR ONCE EVERY 14 DAYS TO MONITOR BLOOD SUGARS   FreeStyle Precision Neo System w/Device Kit 1 kit by Does not apply route 2 (two) times daily.   FreeStyle Precision Neo Test test strip Generic drug: glucose blood CHECKING BLOOD SUGAR TWICE DAILY.   Lantus SoloStar 100 UNIT/ML Solostar Pen Generic drug: insulin glargine INJECT 40 UNITS UNDER THE SKIN IN THE MORNING AND 28 UNITS IN THE EVENING What changed: Another medication with the same name was added. Make sure you understand how and when to take each. Changed by: Elayne Snare, MD   Toujeo Max SoloStar 300 UNIT/ML Solostar Pen Generic drug: insulin glargine (2 Unit Dial) Inject 52 units in morning and 32 units in the afternoon. What changed: You were already taking a medication with the same name, and this prescription was added. Make sure you understand how and when to take each. Changed by: Elayne Snare, MD   lisinopril 2.5 MG tablet Commonly known as: ZESTRIL TAKE ONE (1) TABLET BY MOUTH EVERY DAY   metoCLOPramide 5 MG tablet Commonly known as: REGLAN TAKE ONE TO TWO TABLETS BY MOUTH EVERY  SIX HOURS AS NEEDED FOR NAUSEAOR VOMITING   montelukast 10 MG tablet Commonly known as: SINGULAIR TAKE ONE TABLET (10MG TOTAL) BY MOUTH ATBEDTIME   NovoLOG FlexPen 100 UNIT/ML FlexPen Generic drug: insulin aspart INJECT 8 TO 12 UNITS SUBCUTANEOUSLY BEFORE EACH MEAL AS DIRECTED. ADJUST PERSLIDING SCALE TO A MAXIMUM DOSE PER DAY OF 51 UNITS.   omeprazole 40 MG capsule Commonly known as: PRILOSEC TAKE ONE CAPSULE BY MOUTH 30 MINUTES PRIOR TO BREAKFAST AND SUPPER   ondansetron 4 MG tablet Commonly known as: ZOFRAN TAKE ONE TABLET BY MOUTH EVERY EIGHT HOURS AS NEEDED FOR NAUSEA OR VOMITING   OneTouch Delica Lancets 50N Misc USE FOUR TIMES A DAY AS DIRECTED.   polyethylene glycol 17 g packet Commonly known as: MIRALAX / GLYCOLAX Take 17 g by mouth daily as needed.  rosuvastatin 40 MG tablet Commonly known as: CRESTOR Take 1 tablet (40 mg total) by mouth daily.   SPS 15 GM/60ML suspension Generic drug: sodium polystyrene Take by mouth.   Sure Comfort Pen Needles 30G X 8 MM Misc Generic drug: Insulin Pen Needle 4 boxes (88day supply)   vitamin B-12 100 MCG tablet Commonly known as: CYANOCOBALAMIN Take 1 tablet by mouth daily.   VITAMIN D PO Take 1 Dose by mouth daily.        Allergies: No Known Allergies  Past Medical History:  Diagnosis Date   Anxiety    Dyslipidemia    Gastroparesis due to DM ([place]) OCT 2014   75% AT 2 HRS, GLU >    H/O eye surgery 01/2018   Headache(784.0)    IDDM (insulin dependent diabetes mellitus)    Neuropathy    Nicotine addiction    TIA (transient ischemic attack)    Dr. Merlene Laughter    Past Surgical History:  Procedure Laterality Date   Cataract surgery Bilateral 01/20/2017, 02/03/2017   COLONOSCOPY N/A 05/09/2016   Dr. Oneida Alar: 2 hyperplastic polyps removed.  Next colonoscopy in 2027   ESOPHAGOGASTRODUODENOSCOPY N/A 08/31/2013   SLF: 1. Earky satiety nausea may be due to Gastroparesis/pyloric channel stenosis. 2. small hiatal hernia  3. Moderate erosive gastritis.    EYE SURGERY  01/2018   laser surgery bilateral eye      Family History  Problem Relation Age of Onset   Hypertension Mother    Diabetes Mother    Hyperlipidemia Mother    Rosacea Mother    Hypertension Father    Diabetes Brother    Breast cancer Maternal Aunt    Colon cancer Neg Hx     Social History:  reports that she quit smoking about 9 years ago. Her smoking use included cigarettes. She has a 36.00 pack-year smoking history. She has never used smokeless tobacco. She reports that she does not drink alcohol and does not use drugs.   Review of Systems   Lipid history: Followed by PCP, currently on Crestor 40 mg Labs as follows She has aortic atherosclerosis on  CT scan   Lab Results  Component Value Date   CHOL 150 07/10/2021   HDL 64.00 07/10/2021   LDLCALC 70 07/10/2021   TRIG 78.0 07/10/2021   CHOLHDL 2 07/10/2021           Hypertension: Mild and only on 2.5 mg lisinopril However she did have 1 episode of hyperkalemia  BP Readings from Last 3 Encounters:  07/10/21 112/70  03/21/21 133/77  02/26/21 (!) 144/72    Most recent eye exam was in 01/2019  Most recent foot exam: 12/20  GASTROPARESIS: Has symptoms of fullness and nausea, will take Zofran as needed  Followed by gastroenterologist  She is on Reglan before meals, now taking 21m 30 minutes before each meal but still has some nausea  Usually eating 3 meals a day but may skip breakfast sometimes  Currently known complications of diabetes: Gastroparesis, neuropathy, retinopathy, no nephropathy  LABS:  Office Visit on 07/10/2021  Component Date Value Ref Range Status   Hemoglobin A1C 07/10/2021 8.6 (A) 4.0 - 5.6 % Corrected   Glucose Fasting, POC 07/10/2021 256 (A) 70 - 99 mg/dL Final   Cholesterol 07/10/2021 150  0 - 200 mg/dL Final   ATP III Classification       Desirable:  < 200 mg/dL               Borderline  High:  200 - 239 mg/dL          High:  > = 240 mg/dL    Triglycerides 07/10/2021 78.0  0.0 - 149.0 mg/dL Final   Normal:  <150 mg/dLBorderline High:  150 - 199 mg/dL   HDL 07/10/2021 64.00  >39.00 mg/dL Final   VLDL 07/10/2021 15.6  0.0 - 40.0 mg/dL Final   LDL Cholesterol 07/10/2021 70  0 - 99 mg/dL Final   Total CHOL/HDL Ratio 07/10/2021 2   Final                  Men          Women1/2 Average Risk     3.4          3.3Average Risk          5.0          4.42X Average Risk          9.6          7.13X Average Risk          15.0          11.0                       NonHDL 07/10/2021 85.56   Final   NOTE:  Non-HDL goal should be 30 mg/dL higher than patient's LDL goal (i.e. LDL goal of < 70 mg/dL, would have non-HDL goal of < 100 mg/dL)   Sodium 07/10/2021 139  135 - 145 mEq/L Final   Potassium 07/10/2021 4.4  3.5 - 5.1 mEq/L Final   Chloride 07/10/2021 104  96 - 112 mEq/L Final   CO2 07/10/2021 27  19 - 32 mEq/L Final   Glucose, Bld 07/10/2021 225 (A) 70 - 99 mg/dL Final   BUN 07/10/2021 15  6 - 23 mg/dL Final   Creatinine, Ser 07/10/2021 1.04  0.40 - 1.20 mg/dL Final   Total Bilirubin 07/10/2021 0.5  0.2 - 1.2 mg/dL Final   Alkaline Phosphatase 07/10/2021 80  39 - 117 U/L Final   AST 07/10/2021 24  0 - 37 U/L Final   ALT 07/10/2021 24  0 - 35 U/L Final   Total Protein 07/10/2021 7.4  6.0 - 8.3 g/dL Final   Albumin 07/10/2021 4.4  3.5 - 5.2 g/dL Final   GFR 07/10/2021 61.24  >60.00 mL/min Final   Calculated using the CKD-EPI Creatinine Equation (2021)   Calcium 07/10/2021 9.7  8.4 - 10.5 mg/dL Final    Physical Examination:  BP 112/70   Pulse 78   Ht '5\' 8"'  (1.727 m)   Wt 127 lb 3.2 oz (57.7 kg)   LMP 07/26/2013   SpO2 98%   BMI 19.34 kg/m        ASSESSMENT:  Diabetes type 1, poorly controlled with multiple complications  See history of present illness for detailed discussion of current diabetes management, blood sugar patterns from her CGM download and problems identified  Her A1c is consistently over 8% and now 8.6  Her blood  sugars are still not controlled from inadequate insulin  However she appears to also have a significant dawn phenomenon and increased blood sugars throughout the day  Only early morning she has better blood sugars with some hypoglycemia also  The data from her freestyle Elenor Legato was discussed in detail  Discussed also when she needs more insulin and when it needs to be reduced She also may have some variability from using  Lantus  Still does not want to use the pump  GASTROPARESIS: On Reglan with fair control, likely can use a higher dose since she is tolerating it  LIPIDS: Now on Crestor and needs to be followed by PCP    PLAN:   Explained how she needs to be increasing her basal insulin/Lantus in the morning to get to afternoon blood sugars at least under 150 However she needs to reduce her evening dose by 4 units To cover her dawn phenomenon she will take 4 to 6 units NovoLog Also she needs at least 14 to 16 units for breakfast and lunch and larger doses when blood sugars are high Premeal Switch to Toujeo when she finishes her Lantus and the dose will be 4 units more Reminded her to look at the instructions and follow them as she was given similar instructions on her last visit and did not implement them  She will need to take 5 to 10 mg of Reglan with every meal, 3 times a day and additional dose if needed on the days she is having more nausea  Check lipids and chemistry Microalbumin was normal in April   Patient Instructions  Lantus 48 units in am and reduce pm dose by 4 units If sugar at SUPPER STAYS over 150 go up 4 more on am Lantus  TOUEO DOSES WILL be 4 MORE UNITS THAN Lantus  Always take Novolog 4-6 units on waking up and then 14 at [place] 14-16 AT LUNCH  GI DOCTOR: Dr Rhina Brackett  Total visit time including counseling = 30 minutes  Elayne Snare 07/10/2021, 4:14 PM   Note: This office note was prepared with Dragon voice recognition system technology. Any  transcriptional errors that result from this process are unintentional.

## 2021-07-11 ENCOUNTER — Other Ambulatory Visit: Payer: Self-pay | Admitting: Internal Medicine

## 2021-07-18 ENCOUNTER — Other Ambulatory Visit: Payer: Self-pay

## 2021-07-18 DIAGNOSIS — E1140 Type 2 diabetes mellitus with diabetic neuropathy, unspecified: Secondary | ICD-10-CM

## 2021-07-18 DIAGNOSIS — Z794 Long term (current) use of insulin: Secondary | ICD-10-CM

## 2021-07-18 MED ORDER — SURE COMFORT PEN NEEDLES 30G X 8 MM MISC: 3 refills | Status: DC

## 2021-07-24 ENCOUNTER — Other Ambulatory Visit: Payer: Self-pay | Admitting: Endocrinology

## 2021-07-24 DIAGNOSIS — F4323 Adjustment disorder with mixed anxiety and depressed mood: Secondary | ICD-10-CM | POA: Diagnosis not present

## 2021-08-22 ENCOUNTER — Other Ambulatory Visit: Payer: Self-pay | Admitting: Endocrinology

## 2021-08-24 ENCOUNTER — Encounter: Payer: PPO | Admitting: Family Medicine

## 2021-08-27 ENCOUNTER — Other Ambulatory Visit: Payer: Self-pay

## 2021-08-27 ENCOUNTER — Ambulatory Visit (INDEPENDENT_AMBULATORY_CARE_PROVIDER_SITE_OTHER): Payer: PPO | Admitting: Podiatry

## 2021-08-27 ENCOUNTER — Encounter: Payer: Self-pay | Admitting: Podiatry

## 2021-08-27 DIAGNOSIS — E1142 Type 2 diabetes mellitus with diabetic polyneuropathy: Secondary | ICD-10-CM

## 2021-08-27 DIAGNOSIS — M129 Arthropathy, unspecified: Secondary | ICD-10-CM

## 2021-08-27 DIAGNOSIS — B351 Tinea unguium: Secondary | ICD-10-CM

## 2021-08-27 DIAGNOSIS — M79675 Pain in left toe(s): Secondary | ICD-10-CM

## 2021-08-27 DIAGNOSIS — M79674 Pain in right toe(s): Secondary | ICD-10-CM | POA: Diagnosis not present

## 2021-08-27 NOTE — Progress Notes (Signed)
This patient returns to my office for at risk foot care.  This patient requires this care by a professional since this patient will be at risk due to having DM with neuropathy.  This patient is unable to cut nails herself since the patient cannot reach her nails.These nails are painful walking and wearing shoes.  Patient has not been seen I over 7 months. This patient presents for at risk foot care today.  General Appearance  Alert, conversant and in no acute stress.  Vascular  Dorsalis pedis and posterior tibial  pulses are palpable  bilaterally.  Capillary return is within normal limits  bilaterally. Temperature is within normal limits  bilaterally.  Neurologic  Senn-Weinstein monofilament wire test absent   bilaterally. Muscle power within normal limits bilaterally.  Nails Thick disfigured discolored nails with subungual debris  from hallux to fifth toes bilaterally. No evidence of bacterial infection or drainage bilaterally.  Orthopedic  No limitations of motion  feet .  No crepitus or effusions noted.  No bony pathology or digital deformities noted.  HAV  B/L.  Skin  normotropic skin with no porokeratosis noted bilaterally.  No signs of infections or ulcers noted.     Onychomycosis  Pain in right toes  Pain in left toes  Consent was obtained for treatment procedures.   Mechanical debridement of nails 1-5  bilaterally performed with a nail nipper.  Filed with dremel without incident.    Return office visit   4 months                   Told patient to return for periodic foot care and evaluation due to potential at risk complications.   Gardiner Barefoot DPM

## 2021-09-12 ENCOUNTER — Ambulatory Visit: Payer: PPO | Admitting: Endocrinology

## 2021-09-18 DIAGNOSIS — H4321 Crystalline deposits in vitreous body, right eye: Secondary | ICD-10-CM | POA: Diagnosis not present

## 2021-09-18 DIAGNOSIS — H43391 Other vitreous opacities, right eye: Secondary | ICD-10-CM | POA: Diagnosis not present

## 2021-09-18 DIAGNOSIS — E103593 Type 1 diabetes mellitus with proliferative diabetic retinopathy without macular edema, bilateral: Secondary | ICD-10-CM | POA: Diagnosis not present

## 2021-09-18 DIAGNOSIS — H35371 Puckering of macula, right eye: Secondary | ICD-10-CM | POA: Diagnosis not present

## 2021-09-24 ENCOUNTER — Other Ambulatory Visit: Payer: Self-pay | Admitting: Endocrinology

## 2021-10-05 ENCOUNTER — Encounter: Payer: PPO | Admitting: Family Medicine

## 2021-10-09 ENCOUNTER — Other Ambulatory Visit: Payer: Self-pay | Admitting: Endocrinology

## 2021-10-11 DIAGNOSIS — F4323 Adjustment disorder with mixed anxiety and depressed mood: Secondary | ICD-10-CM | POA: Diagnosis not present

## 2021-10-12 ENCOUNTER — Other Ambulatory Visit: Payer: Self-pay | Admitting: Gastroenterology

## 2021-10-16 NOTE — Progress Notes (Signed)
reschedule

## 2021-11-06 ENCOUNTER — Other Ambulatory Visit: Payer: Self-pay | Admitting: Endocrinology

## 2021-11-07 ENCOUNTER — Telehealth: Payer: Self-pay | Admitting: Endocrinology

## 2021-11-07 NOTE — Telephone Encounter (Signed)
Patient called and requested that we resend the Free Style [place] 2 Sensor to the [facility] at [facility] in [place]. She has spoken with the pharmacy and the do not have record of the [place] 2 Sensor RX that was sent 08/2021 and I spoke with the pharmacy and they confirmed that the do no have the RX for the Free Style 2 Sensor . Patient has sensor on today that will last for approximately 4 more hours.  Any questions or concerns please call patient at [phone]

## 2021-11-15 ENCOUNTER — Other Ambulatory Visit: Payer: Self-pay | Admitting: Family Medicine

## 2021-12-04 ENCOUNTER — Other Ambulatory Visit: Payer: Self-pay | Admitting: Endocrinology

## 2021-12-11 ENCOUNTER — Encounter: Payer: Self-pay | Admitting: Endocrinology

## 2021-12-11 ENCOUNTER — Ambulatory Visit: Payer: PPO | Admitting: Endocrinology

## 2021-12-11 ENCOUNTER — Other Ambulatory Visit: Payer: Self-pay

## 2021-12-11 ENCOUNTER — Ambulatory Visit (INDEPENDENT_AMBULATORY_CARE_PROVIDER_SITE_OTHER): Payer: PPO | Admitting: Family Medicine

## 2021-12-11 VITALS — BP 117/66 | HR 97 | Resp 17 | Ht 68.0 in | Wt 126.0 lb

## 2021-12-11 VITALS — BP 122/62 | HR 87 | Ht 68.0 in | Wt 124.4 lb

## 2021-12-11 DIAGNOSIS — R234 Changes in skin texture: Secondary | ICD-10-CM | POA: Diagnosis not present

## 2021-12-11 DIAGNOSIS — K3184 Gastroparesis: Secondary | ICD-10-CM | POA: Diagnosis not present

## 2021-12-11 DIAGNOSIS — E1140 Type 2 diabetes mellitus with diabetic neuropathy, unspecified: Secondary | ICD-10-CM

## 2021-12-11 DIAGNOSIS — E1040 Type 1 diabetes mellitus with diabetic neuropathy, unspecified: Secondary | ICD-10-CM

## 2021-12-11 DIAGNOSIS — Z794 Long term (current) use of insulin: Secondary | ICD-10-CM

## 2021-12-11 DIAGNOSIS — E1043 Type 1 diabetes mellitus with diabetic autonomic (poly)neuropathy: Secondary | ICD-10-CM

## 2021-12-11 DIAGNOSIS — R1012 Left upper quadrant pain: Secondary | ICD-10-CM

## 2021-12-11 DIAGNOSIS — Z0001 Encounter for general adult medical examination with abnormal findings: Secondary | ICD-10-CM

## 2021-12-11 LAB — POCT GLYCOSYLATED HEMOGLOBIN (HGB A1C): Hemoglobin A1C: 9.5 % — AB (ref 4.0–5.6)

## 2021-12-11 MED ORDER — ONETOUCH VERIO VI STRP: ORAL_STRIP | 2 refills | Status: DC

## 2021-12-11 MED ORDER — HYDROCORTISONE 0.5 % EX CREA: TOPICAL_CREAM | CUTANEOUS | 0 refills | Status: DC

## 2021-12-11 NOTE — Progress Notes (Signed)
Patient ID: Ashley Armstrong, female   DOB: July 05, 1966, 56 y.o.   MRN: [id number]           Reason for Appointment: Follow-up for Type 1 Diabetes  Referring physician: Dr. [name]   History of Present Illness:          Date of diagnosis of type 1 diabetes mellitus: Age 76       Background history:  She has been on insulin practically since her diagnosis was made.  Initially she had symptoms of weight loss and blurred vision For several years has been on Lantus and NovoLog but usually has had poor control with A1c mostly between 8-10%  Recent history:     INSULIN regimen is: TOUJEO 32 units a.m., 20 units p.m .  NovoLog at mealtimes mostly 8-14 units    Current management, blood sugar patterns and problems identified:  Her A1c is higher than usual at 9.5 compared to 8.6  She was able to switch from Lantus to [facility] but she forgot the instructions for the doses and is taking much less insulin both morning and evening  With this her blood sugars are overall somewhat higher especially A1c She thinks that most of the month of December or more she has been eating poorly and not watching her diet  She is usually concerned about her blood sugars getting low early morning and will overtreat a low sugars including today  Despite her blood sugars going up progressively in the afternoon she does not increase her mealtime doses  She is not adjusting her mealtime dose much based on her carbohydrate intake Also not taking correction doses  Frequently will not take her insulin at the start of the meal if the blood sugar is low or low normal in the mornings Again refuses to think about insulin pump Her weight has gradually declined since last year           Meal times are:  Breakfast is 7 AM at Lunch:  PM dinner:3 PM  Typical meal intake: Breakfast is sometimes high fat but otherwise eggs and toast.  Lunch usually a mixed meal and evening meal may be a sandwich with bland food.  Snacks  will be cheese and crackers Usually avoiding drinks with sugar            Exercise: None       Blood Glucose readings by time of day and averages from download   CONTINUOUS GLUCOSE MONITORING RECORD INTERPRETATION    Dates of Recording: Last 2 weeks  Sensor description: Libre 2   Glycemic patterns summary: Blood sugars are very consistently high at most times except early morning hours.  Blood sugars start rising significantly between 6 AM- 8 AM progressively until early afternoon and may improve somewhat in the evenings but are still averaging over 200 most of the day  Overnight blood sugars start falling after about midnight and generally decreases until about 7 AM with moderate variability; hypoglycemia has been present on a couple of occasions early morning  Blood sugars are rising significantly in the mornings and continue to rise after breakfast and lunch, frequently exceeding 350  Blood sugars are somewhat improved before dinnertime but still averaging around 200 and may not rise as much after dinner  Rare transient low sugars present overnight as above   Results statistics:  CGM use % of time 96  2-week average/GV 222/31  Time in range 29  % Time Above 180 34+37  % Time  above 250   % Time Below 70 0     PRE-MEAL Fasting Lunch Dinner Bedtime Overall  Glucose range:       Averages: 158 236 225  222   POST-MEAL PC Breakfast PC Lunch PC Dinner  Glucose range:     Averages: 213 289 257    Prior  CGM use % of time  96  2-week average/GV 211  Time in range     29   %  % Time Above 180 33+28  % Time above 250    % Time Below 70  1     PRE-MEAL Fasting Lunch Dinner Bedtime Overall  Glucose range:       Averages: 183 230 208  211   POST-MEAL PC Breakfast PC Lunch PC Dinner  Glucose range:     Averages: 256 242 195      Dietician visit, most recent: 11/18  Weight history:  Wt Readings from Last 3 Encounters:  12/11/21 124 lb 6.4 oz (56.4 kg)  07/10/21  127 lb 3.2 oz (57.7 kg)  04/23/21 130 lb (59 kg)    Glycemic control:   Lab Results  Component Value Date   HGBA1C 9.5 (A) 12/11/2021   HGBA1C 8.6 (A) 07/10/2021   HGBA1C 8.9 (A) 02/26/2021   Lab Results  Component Value Date   MICROALBUR 3.2 (H) 02/26/2021   LDLCALC 70 07/10/2021   CREATININE 1.04 07/10/2021   Lab Results  Component Value Date   MICRALBCREAT 7.2 02/26/2021    No results found for: FRUCTOSAMINE  Office Visit on 12/11/2021  Component Date Value Ref Range Status   Hemoglobin A1C 12/11/2021 9.5 (A)  4.0 - 5.6 % Final    Allergies as of 12/11/2021   No Known Allergies      Medication List        Accurate as of December 11, 2021  2:19 PM. If you have any questions, ask your nurse or doctor.          STOP taking these medications    benzonatate 100 MG capsule Commonly known as: Best boy Stopped by: Elayne Snare, MD       TAKE these medications    diazepam 10 MG tablet Commonly known as: VALIUM as needed.   Durezol 0.05 % Emul Generic drug: Difluprednate Apply 1 drop to eye daily at 8 pm.   Fish Oil 1000 MG Caps Take 1 capsule by mouth daily.   FreeStyle Libre 14 Day Reader [place] reader to monitor blood sugars continuously.   FreeStyle Libre 2 Reader Kerrin Mo USE FREESTYLE LIBRE 2 READER TO MONITOR BLOOD SUGARS CONTINUOUSLY   FreeStyle Libre 2 Sensor Misc USE ONE SENSOR ONCE EVERY 14 DAYS TO MONITOR BLOOD SUGARS   FreeStyle Precision Neo System w/Device Kit 1 kit by Does not apply route 2 (two) times daily.   Lantus SoloStar 100 UNIT/ML Solostar Pen Generic drug: insulin glargine INJECT 40 UNITS UNDER THE SKIN IN THE MORNING AND 28 UNITS IN THE EVENING What changed: Another medication with the same name was changed. Make sure you understand how and when to take each.   Toujeo Max SoloStar 300 UNIT/ML Solostar Pen Generic drug: insulin glargine (2 Unit Dial) Inject 52 units in morning and 32 units in the  afternoon. What changed: additional instructions   lisinopril 2.5 MG tablet Commonly known as: ZESTRIL TAKE ONE (1) TABLET BY MOUTH EVERY DAY   metoCLOPramide 5 MG tablet Commonly known as: REGLAN TAKE 1 TO  2 TABLETS BY MOUTH EVERY 6 HOURS AS NEEDED FOR NAUSEA OR VOMITING.   montelukast 10 MG tablet Commonly known as: SINGULAIR TAKE ONE TABLET (10MG TOTAL) BY MOUTH ATBEDTIME   NovoLOG FlexPen 100 UNIT/ML FlexPen Generic drug: insulin aspart INJECT 8 TO 12 UNITS SUBCUTANEOUSLY BEFORE EACH MEAL AS DIRECTED. ADJUST PERSLIDING SCALE TO A MAXIMUM DOSE PER DAY OF 51 UNITS.   omeprazole 40 MG capsule Commonly known as: PRILOSEC TAKE ONE CAPSULE BY MOUTH 30 MINUTES PRIOR TO BREAKFAST AND SUPPER   ondansetron 4 MG tablet Commonly known as: ZOFRAN TAKE ONE TABLET BY MOUTH EVERY EIGHT HOURS AS NEEDED FOR NAUSEA OR VOMITING   OneTouch Delica Lancets 03P Misc USE FOUR TIMES A DAY AS DIRECTED.   OneTouch Verio test strip Generic drug: glucose blood Use to test blood sugar 2 times a day What changed: See the new instructions. Changed by: Elayne Snare, MD   polyethylene glycol 17 g packet Commonly known as: MIRALAX / GLYCOLAX Take 17 g by mouth daily as needed.   rosuvastatin 40 MG tablet Commonly known as: CRESTOR Take 1 tablet (40 mg total) by mouth daily.   SPS 15 GM/60ML suspension Generic drug: sodium polystyrene Take by mouth.   Sure Comfort Pen Needles 30G X 8 MM Misc Generic drug: Insulin Pen Needle 4 boxes (88day supply)   vitamin B-12 100 MCG tablet Commonly known as: CYANOCOBALAMIN Take 1 tablet by mouth daily.   VITAMIN D PO Take 1 Dose by mouth daily.        Allergies: No Known Allergies  Past Medical History:  Diagnosis Date   Anxiety    Dyslipidemia    Gastroparesis due to DM ([place]) OCT 2014   75% AT 2 HRS, GLU >    H/O eye surgery 01/2018   Headache(784.0)    IDDM (insulin dependent diabetes mellitus)    Neuropathy    Nicotine addiction    TIA  (transient ischemic attack)    Dr. Merlene Laughter    Past Surgical History:  Procedure Laterality Date   Cataract surgery Bilateral 01/20/2017, 02/03/2017   COLONOSCOPY N/A 05/09/2016   Dr. Oneida Alar: 2 hyperplastic polyps removed.  Next colonoscopy in 2027   ESOPHAGOGASTRODUODENOSCOPY N/A 08/31/2013   SLF: 1. Earky satiety nausea may be due to Gastroparesis/pyloric channel stenosis. 2. small hiatal hernia 3. Moderate erosive gastritis.    EYE SURGERY  01/2018   laser surgery bilateral eye      Family History  Problem Relation Age of Onset   Hypertension Mother    Diabetes Mother    Hyperlipidemia Mother    Rosacea Mother    Hypertension Father    Diabetes Brother    Breast cancer Maternal Aunt    Colon cancer Neg Hx     Social History:  reports that she quit smoking about 10 years ago. Her smoking use included cigarettes. She has a 36.00 pack-year smoking history. She has never used smokeless tobacco. She reports that she does not drink alcohol and does not use drugs.   Review of Systems   Lipid history: Followed by PCP, currently on Crestor 40 mg Labs as follows She has aortic atherosclerosis on  CT scan   Lab Results  Component Value Date   CHOL 150 07/10/2021   HDL 64.00 07/10/2021   LDLCALC 70 07/10/2021   TRIG 78.0 07/10/2021   CHOLHDL 2 07/10/2021           Blood pressure history:  BP Readings from Last 3 Encounters:  12/11/21  122/62  07/10/21 112/70  03/21/21 133/77    Most recent eye exam was in 01/2019  Most recent foot exam: 12/20  GASTROPARESIS: Has symptoms of fullness and nausea, will take Zofran as needed  Followed by gastroenterologist  She is on Reglan before meals, taking 5 mg 30 minutes before most meals but still has some bloating  Usually eating 3 meals a day but may skip breakfast sometimes  Currently known complications of diabetes: Gastroparesis, neuropathy, retinopathy, no nephropathy  LABS:  Office Visit on 12/11/2021  Component Date  Value Ref Range Status   Hemoglobin A1C 12/11/2021 9.5 (A)  4.0 - 5.6 % Final    Physical Examination:  BP 122/62    Pulse 87    Ht '5\' 8"'  (1.727 m)    Wt 124 lb 6.4 oz (56.4 kg)    LMP 07/26/2013    SpO2 99%    BMI 18.91 kg/m        ASSESSMENT:  Diabetes type 1, poorly controlled with multiple complications  See history of present illness for detailed discussion of current diabetes management, blood sugar patterns from her CGM download and problems identified  Her A1c is consistently over 8% and now 9.5  Her blood sugars are still not controlled from inadequate insulin, both basal and bolus With taking Toujeo insulin she is still appears to be requiring less insulin in the evening compared to Lantus but her blood sugars are markedly increased during the daytime She may benefit from just taking the Toujeo in the morning with the higher dose, currently taking a total of 52 units daily Also appears to be needing significantly more mealtime coverage especially at breakfast  Again discussed in detail the benefits of an insulin pump including requirement of insulin going down and prevention of hypoglycemia  GASTROPARESIS: On Reglan 5 mg up to 3 times a day with fair control, still has some bloating and has been hesitant to take the full doses of Reglan  LIPIDS: She is on Crestor and needs to be followed by PCP    PLAN:   Explained titration of her basal insulin She was given a flowsheet to start writing down her blood sugar and insulin doses which will also make sure that she is consistent with her basal insulin She likely needs to start with 60 units of Toujeo on waking up and go up by 4 units until  before supper blood sugar is below 150 consistently unless morning sugars start getting low normal If she has her dawn phenomenon she will take 4 to 6 units NovoLog on waking up also Also she needs at least 16-18 units for breakfast and lunch and larger doses when blood sugars are high  before eating Discussed targeting postprandial readings with mealtime dose adjustment Avoid overtreating low sugars  She will need to take at least 10 mg of Reglan with her lunch meal and discussed that this is not going to cause long-term side effects  Recheck microalbumin in April    Patient Instructions  Toujeo 60 units on waking up  Take 16-18 Novolog in am and lunch and 14-18 at supper based on Carbs  Take 2 Reglan before lunch  Keep after meal sugar <200  Total visit time including counseling = 30 minutes  Elayne Snare 12/11/2021, 2:19 PM   Note: This office note was prepared with Dragon voice recognition system technology. Any transcriptional errors that result from this process are unintentional.

## 2021-12-11 NOTE — Patient Instructions (Addendum)
F/U in 4 months, call if you need me sooner, with pap  Fasting lipid, cmp and eGFR in next 1 week Please get your shingrix vaccines  at the pharmacy  Hydrocortisone prescribed for sparing use to right ear outer canal for crusting  We will schedule abdominal scan and refer you to new GI Doctor  Thanks for choosing [facility], we consider it a privelige to serve you.

## 2021-12-11 NOTE — Patient Instructions (Addendum)
Toujeo 60 units on waking up  Take 16-18 Novolog in am and lunch and 14-18 at supper based on Carbs  Take 2 Reglan before lunch  Keep after meal sugar <200

## 2021-12-11 NOTE — Progress Notes (Signed)
Ashley Armstrong     MRN: [id number]      DOB: 1966-07-21  HPI: Patient is in for annual physical exam.  Immunization is reviewed  c/o pulling sensation in LUQ, constant , like pain, under the rib cage, no aggravating or relieving factors, requests change in GI ,provider C/o sore crusted areain right outer ear canal Immunization is updated  PE: BP 117/66    Pulse 97    Resp 17    Ht 5\' 8"  (1.727 m)    Wt 126 lb (57.2 kg)    LMP 07/26/2013    SpO2 97%    BMI 19.16 kg/m   Pleasant  female, alert and oriented x 3, in no cardio-pulmonary distress. Afebrile. HEENT No facial trauma or asymetry. Sinuses non tender.  Extra occullar muscles intact..TM clear bilaterally, scaly skin of external ear / outer canal right ear External ears normal, . Neck: supple, no adenopathy,JVD or thyromegaly.No bruits.  Chest: Clear to ascultation bilaterally.No crackles or wheezes. Non tender to palpation   Cardiovascular system; Heart sounds normal,  S1 and  S2 ,no S3.  No murmur, or thrill. Apical beat not displaced Peripheral pulses normal.  Abdomen: Soft, LUQ tender no organomegaly or masses. No bruits. Bowel sounds normal. No guarding, or rebound.     Musculoskeletal exam: Full ROM of spine, hips , shoulders and knees. No deformity ,swelling or crepitus noted. No muscle wasting or atrophy.   Neurologic: Cranial nerves 2 to 12 intact. Power, tone ,sensation and reflexes normal throughout. No disturbance in gait. No tremor.  Skin: Intact, no ulceration, erythema , scaling  rash noted.in righter outer ear canal Pigmentation normal throughout  Psych; Normal mood and affect. Judgement and concentration normal   Assessment & Plan:  Annual visit for general adult medical examination with abnormal findings Annual exam as documented. Immunization and cancer screening needs are specifically addressed at this visit.   Abdominal pain LUQ pain and tenderness  X monhts, perisistent,  CT scan and GI eval  Scaling of skin Crusty lesion in right outer ear canal, topical hydrocorisone as needed, sparingly  Type 2 diabetes mellitus with diabetic neuropathy, unspecified ([place]) Ashley Armstrong is reminded of the importance of commitment to daily physical activity for 30 minutes or more, as able and the need to limit carbohydrate intake to 30 to 60 grams per meal to help with blood sugar control.   The need to take medication as prescribed, test blood sugar as directed, and to call between visits if there is a concern that blood sugar is uncontrolled is also discussed.   Ashley Armstrong is reminded of the importance of daily foot exam, annual eye examination, and good blood sugar, blood pressure and cholesterol control.  Diabetic Labs Latest Ref Rng & Units 12/11/2021 07/10/2021 03/29/2021 03/21/2021 02/26/2021  HbA1c 4.0 - 5.6 % 9.5(A) 8.6(A) - - 8.9(A)  Microalbumin 0.0 - 1.9 mg/dL - - - - 3.2(H)  Micro/Creat Ratio 0.0 - 30.0 mg/g - - - - 7.2  Chol 0 - 200 mg/dL - 150 - 211(H) -  HDL >39.00 mg/dL - 64.00 - 81 -  Calc LDL 0 - 99 mg/dL - 70 - 111(H) -  Triglycerides 0.0 - 149.0 mg/dL - 78.0 - 110 -  Creatinine 0.40 - 1.20 mg/dL - 1.04 0.94 1.02(H) -  GFR >60.00 mL/min - 61.24 - - -   BP/Weight 12/11/2021 12/11/2021 07/10/2021 04/23/2021 03/21/2021 02/26/2021 01/74/9449  Systolic BP 675 916 384 - 133 144 140  Diastolic BP 62 66 70 - 77 72 78  Wt. (Lbs) 124.4 126 127.2 130 130 129.2 131.5  BMI 18.91 19.16 19.34 19.77 19.77 19.64 19.99   Foot/eye exam completion dates Latest Ref Rng & Units 12/11/2021 01/09/2021  Eye Exam No Retinopathy - Retinopathy(A)  Foot Form Completion - Done -

## 2021-12-12 ENCOUNTER — Other Ambulatory Visit: Payer: Self-pay

## 2021-12-12 ENCOUNTER — Ambulatory Visit (INDEPENDENT_AMBULATORY_CARE_PROVIDER_SITE_OTHER): Payer: PPO

## 2021-12-12 ENCOUNTER — Encounter: Payer: Self-pay | Admitting: Family Medicine

## 2021-12-12 DIAGNOSIS — R234 Changes in skin texture: Secondary | ICD-10-CM | POA: Insufficient documentation

## 2021-12-12 DIAGNOSIS — Z794 Long term (current) use of insulin: Secondary | ICD-10-CM | POA: Diagnosis not present

## 2021-12-12 DIAGNOSIS — E1140 Type 2 diabetes mellitus with diabetic neuropathy, unspecified: Secondary | ICD-10-CM | POA: Diagnosis not present

## 2021-12-12 DIAGNOSIS — Z0001 Encounter for general adult medical examination with abnormal findings: Secondary | ICD-10-CM | POA: Insufficient documentation

## 2021-12-12 DIAGNOSIS — E785 Hyperlipidemia, unspecified: Secondary | ICD-10-CM

## 2021-12-12 DIAGNOSIS — Z23 Encounter for immunization: Secondary | ICD-10-CM

## 2021-12-12 NOTE — Assessment & Plan Note (Signed)
LUQ pain and tenderness  X monhts, perisistent, CT scan and GI eval

## 2021-12-12 NOTE — Assessment & Plan Note (Signed)
Ashley Armstrong is reminded of the importance of commitment to daily physical activity for 30 minutes or more, as able and the need to limit carbohydrate intake to 30 to 60 grams per meal to help with blood sugar control.   The need to take medication as prescribed, test blood sugar as directed, and to call between visits if there is a concern that blood sugar is uncontrolled is also discussed.   Ashley Armstrong is reminded of the importance of daily foot exam, annual eye examination, and good blood sugar, blood pressure and cholesterol control.  Diabetic Labs Latest Ref Rng & Units 12/11/2021 07/10/2021 03/29/2021 03/21/2021 02/26/2021  HbA1c 4.0 - 5.6 % 9.5(A) 8.6(A) - - 8.9(A)  Microalbumin 0.0 - 1.9 mg/dL - - - - 3.2(H)  Micro/Creat Ratio 0.0 - 30.0 mg/g - - - - 7.2  Chol 0 - 200 mg/dL - 150 - 211(H) -  HDL >39.00 mg/dL - 64.00 - 81 -  Calc LDL 0 - 99 mg/dL - 70 - 111(H) -  Triglycerides 0.0 - 149.0 mg/dL - 78.0 - 110 -  Creatinine 0.40 - 1.20 mg/dL - 1.04 0.94 1.02(H) -  GFR >60.00 mL/min - 61.24 - - -   BP/Weight 12/11/2021 12/11/2021 07/10/2021 04/23/2021 03/21/2021 02/26/2021 40/98/1191  Systolic BP 478 295 621 - 308 657 846  Diastolic BP 62 66 70 - 77 72 78  Wt. (Lbs) 124.4 126 127.2 130 130 129.2 131.5  BMI 18.91 19.16 19.34 19.77 19.77 19.64 19.99   Foot/eye exam completion dates Latest Ref Rng & Units 12/11/2021 01/09/2021  Eye Exam No Retinopathy - Retinopathy(A)  Foot Form Completion - Done -

## 2021-12-12 NOTE — Assessment & Plan Note (Signed)
Crusty lesion in right outer ear canal, topical hydrocorisone as needed, sparingly

## 2021-12-12 NOTE — Assessment & Plan Note (Signed)
Annual exam as documented. . Immunization and cancer screening needs are specifically addressed at this visit.  

## 2021-12-13 ENCOUNTER — Encounter (INDEPENDENT_AMBULATORY_CARE_PROVIDER_SITE_OTHER): Payer: Self-pay | Admitting: *Deleted

## 2021-12-13 LAB — CMP14+EGFR
ALT: 14 IU/L (ref 0–32)
AST: 17 IU/L (ref 0–40)
Albumin/Globulin Ratio: 1.9 (ref 1.2–2.2)
Albumin: 4.1 g/dL (ref 3.8–4.9)
Alkaline Phosphatase: 93 IU/L (ref 44–121)
BUN/Creatinine Ratio: 16 (ref 9–23)
BUN: 16 mg/dL (ref 6–24)
Bilirubin Total: 0.2 mg/dL (ref 0.0–1.2)
CO2: 23 mmol/L (ref 20–29)
Calcium: 9.4 mg/dL (ref 8.7–10.2)
Chloride: 104 mmol/L (ref 96–106)
Creatinine, Ser: 1.03 mg/dL — ABNORMAL HIGH (ref 0.57–1.00)
Globulin, Total: 2.2 g/dL (ref 1.5–4.5)
Glucose: 252 mg/dL — ABNORMAL HIGH (ref 70–99)
Potassium: 4.7 mmol/L (ref 3.5–5.2)
Sodium: 141 mmol/L (ref 134–144)
Total Protein: 6.3 g/dL (ref 6.0–8.5)
eGFR: 65 mL/min/{1.73_m2} (ref >59)

## 2021-12-13 LAB — LIPID PANEL
Chol/HDL Ratio: 2.3 ratio (ref 0.0–4.4)
Cholesterol, Total: 169 mg/dL (ref 100–199)
HDL: 74 mg/dL (ref >39)
LDL Chol Calc (NIH): 82 mg/dL (ref 0–99)
Triglycerides: 70 mg/dL (ref 0–149)
VLDL Cholesterol Cal: 13 mg/dL (ref 5–40)

## 2021-12-14 ENCOUNTER — Encounter: Payer: Self-pay | Admitting: Endocrinology

## 2021-12-31 ENCOUNTER — Ambulatory Visit: Payer: PPO | Admitting: Podiatry

## 2022-01-01 ENCOUNTER — Other Ambulatory Visit: Payer: Self-pay

## 2022-01-01 ENCOUNTER — Ambulatory Visit (INDEPENDENT_AMBULATORY_CARE_PROVIDER_SITE_OTHER): Payer: PPO | Admitting: Nurse Practitioner

## 2022-01-01 ENCOUNTER — Encounter: Payer: Self-pay | Admitting: Nurse Practitioner

## 2022-01-01 DIAGNOSIS — R051 Acute cough: Secondary | ICD-10-CM | POA: Diagnosis not present

## 2022-01-01 DIAGNOSIS — J019 Acute sinusitis, unspecified: Secondary | ICD-10-CM | POA: Diagnosis not present

## 2022-01-01 MED ORDER — BENZONATATE 100 MG PO CAPS: 100.0000 mg | ORAL_CAPSULE | Freq: Two times a day (BID) | ORAL | 0 refills | Status: DC | PRN

## 2022-01-01 MED ORDER — SALINE NASAL SPRAY 0.65 % NA SOLN: 1.0000 | NASAL | 12 refills | Status: AC | PRN

## 2022-01-01 MED ORDER — FLUTICASONE PROPIONATE 50 MCG/ACT NA SUSP: 2.0000 | Freq: Every day | NASAL | 6 refills | Status: DC

## 2022-01-01 NOTE — Progress Notes (Signed)
Virtual Visit via Telephone Note  I connected with Ligia Duguay @ on 01/01/22 at 0105pmby telephone and verified that I am speaking with the correct person using two identifiers. I spent 8 minutes on this telephone encounter  Location: Patient: home Provider: office   I discussed the limitations, risks, security and privacy concerns of performing an evaluation and management service by telephone and the availability of in person appointments. I also discussed with the patient that there may be a patient responsible charge related to this service. The patient expressed understanding and agreed to proceed.   History of Present Illness: Pt c/o coughing with greenish colored sputum, sneezing, runny nose with clear nasal drainage, stuffy nose at night, symptoms started 6 days ago. Pt denies fever, chills , bloody sputum , HA . She has done covid test twice at home and it was negative, she states that she is no more having sore throat. She states that she has had these symptoms in a long time.    Observations/Objective: Acute rhinosinusitis   Flonase nasal spary, 2 spray daily   Saline nasal spray as needed . No sign of infection from the symptoms recorded above   Acute cough  Home covid test negative twice Symptoms started 6 days ago Use tessalon 100mg  bid prn  Cough most likely viral in origin.  Drink plenty of water to stay hydrated   Assessment and Plan:  Follow Up Instructions:    I discussed the assessment and treatment plan with the patient. The patient was provided an opportunity to ask questions and all were answered. The patient agreed with the plan and demonstrated an understanding of the instructions.   The patient was advised to call back or seek an in-person evaluation if the symptoms worsen or if the condition fails to improve as anticipated.

## 2022-01-01 NOTE — Assessment & Plan Note (Signed)
Acute rhinosinusitis   Flonase nasal spary, 2 spray daily   Saline nasal spray as needed . No sign of infection from the symptoms recorded above

## 2022-01-01 NOTE — Assessment & Plan Note (Signed)
Home covid test negative twice Symptoms started 6 days ago Use tessalon 100mg  bid prn  Cough most likely viral in orign Drink plenty of water to stay hydrated

## 2022-01-03 ENCOUNTER — Ambulatory Visit (HOSPITAL_COMMUNITY): Payer: PPO

## 2022-01-16 DIAGNOSIS — F4323 Adjustment disorder with mixed anxiety and depressed mood: Secondary | ICD-10-CM | POA: Diagnosis not present

## 2022-01-28 ENCOUNTER — Ambulatory Visit (HOSPITAL_COMMUNITY): Payer: PPO

## 2022-01-30 ENCOUNTER — Other Ambulatory Visit: Payer: Self-pay | Admitting: Endocrinology

## 2022-02-06 ENCOUNTER — Encounter: Payer: Self-pay | Admitting: Endocrinology

## 2022-02-06 ENCOUNTER — Ambulatory Visit: Payer: PPO | Admitting: Endocrinology

## 2022-02-06 ENCOUNTER — Other Ambulatory Visit: Payer: Self-pay

## 2022-02-06 VITALS — BP 108/72 | HR 81 | Ht 68.0 in | Wt 125.2 lb

## 2022-02-06 DIAGNOSIS — K3184 Gastroparesis: Secondary | ICD-10-CM | POA: Diagnosis not present

## 2022-02-06 DIAGNOSIS — E1065 Type 1 diabetes mellitus with hyperglycemia: Secondary | ICD-10-CM | POA: Diagnosis not present

## 2022-02-06 DIAGNOSIS — E1043 Type 1 diabetes mellitus with diabetic autonomic (poly)neuropathy: Secondary | ICD-10-CM

## 2022-02-06 LAB — MICROALBUMIN / CREATININE URINE RATIO
Creatinine,U: 93 mg/dL
Microalb Creat Ratio: 8.1 mg/g (ref 0.0–30.0)
Microalb, Ur: 7.6 mg/dL — ABNORMAL HIGH (ref 0.0–1.9)

## 2022-02-06 NOTE — Patient Instructions (Addendum)
Toujeo 56 units  ? ?Novolog 12 to 20 units before each meal ? ?Try Levemir when out of toujeo ?

## 2022-02-06 NOTE — Progress Notes (Signed)
Patient ID: Ashley Armstrong, female   DOB: 1966-06-30, 56 y.o.   MRN: [id number] ? ?       ? ? ?Reason for Appointment: Follow-up for Type 1 Diabetes ? ?Referring physician: Dr. [name] ? ? ?History of Present Illness:  ?        ?Date of diagnosis of type 1 diabetes mellitus: Age 14      ? ?Background history:  ?She has been on insulin practically since her diagnosis was made.  Initially she had symptoms of weight loss and blurred vision ?For several years has been on Lantus and NovoLog but usually has had poor control with A1c mostly between 8-10% ? ?Recent history:  ? ?  ?INSULIN regimen is: TOUJEO 60 units in a.m.  NovoLog at mealtimes mostly 8-14 units  ? ? ?Current management, blood sugar patterns and problems identified: ? ?Her A1c is last higher than usual at 9.5 compared to 8.6 ? ?She was able to switch once a day Toujeo but she does not appear to have any better control  ?With this change her blood sugars may come down overnight to near normal readings early morning but they are still progressively higher the rest of the day  ?Despite instructions on trying to increase her NovoLog insulin she is still taking the same dose  ?Also when her blood sugars are near normal or low normal on waking up she will overtreat the low blood sugar and blood sugar will be higher subsequently including today  ?Her time in range is about the same at 26% ?Her weight has leveled off ?On some days she has blood sugars that are fairly consistently over 250 but otherwise may have postprandial spikes after lunch and sometimes dinner with no consistent pattern ?She thinks that she is taking her insulin at the time of the meal and likely is adjusting it only based on her Premeal blood sugars and not to food intake ?Also blood sugars fluctuate significantly overnight ?She again refuses to consider the insulin pump      ?    ?Meal times are:  Breakfast is 7 AM at Lunch:  ?PM dinner:3 PM ? ?Typical meal intake: Breakfast is  sometimes high fat but otherwise eggs and toast.  Lunch usually a mixed meal and evening meal may be a sandwich with bland food.  Snacks will be cheese and crackers ?Usually avoiding drinks with sugar ?           ?Exercise: None    ? ?Dates of Recording: Last 2 weeks ? ?Sensor description: Elenor Legato 2 ? ?Results statistics: ? ?CGM use % of time   ?2-week average/GV 229/30  ?Time in range    26    %  ?% Time Above 180 34  ?% Time above 250 40  ?% Time Below 70   ? ?  ?PRE-MEAL Fasting Lunch Dinner Bedtime Overall  ?Glucose range:       ?Averages: 212  236  229  ? ?POST-MEAL PC Breakfast PC Lunch PC Dinner  ?Glucose range:     ?Averages: 234 279 248  ? ?Previously: ? ? ?CGM use % of time 96  ?2-week average/GV 222/31  ?Time in range 29  ?% Time Above 180 34+37  ?% Time above 250   ?% Time Below 70 0  ? ?  ?PRE-MEAL Fasting Lunch Dinner Bedtime Overall  ?Glucose range:       ?Averages: 158 236 225  222  ? ?POST-MEAL PC Breakfast PC Lunch  PC Dinner  ?Glucose range:     ?Averages: 213 289 257  ? ?  ?Dietician visit, most recent: 11/18 ? ?Weight history: ? ?Wt Readings from Last 3 Encounters:  ?02/06/22 125 lb 3.2 oz (56.8 kg)  ?12/11/21 126 lb (57.2 kg)  ?12/11/21 124 lb 6.4 oz (56.4 kg)  ? ? ?Glycemic control: ?  ?Lab Results  ?Component Value Date  ? HGBA1C 9.5 (A) 12/11/2021  ? HGBA1C 8.6 (A) 07/10/2021  ? HGBA1C 8.9 (A) 02/26/2021  ? ?Lab Results  ?Component Value Date  ? MICROALBUR 3.2 (H) 02/26/2021  ? [place] 82 12/12/2021  ? CREATININE 1.03 (H) 12/12/2021  ? ?Lab Results  ?Component Value Date  ? MICRALBCREAT 7.2 02/26/2021  ? ? ?No results found for: FRUCTOSAMINE ? ?No visits with results within 1 Week(s) from this visit.  ?Latest known visit with results is:  ?Orders Only on 12/12/2021  ?Component Date Value Ref Range Status  ? Cholesterol, Total 12/12/2021 169  100 - 199 mg/dL Final  ? Triglycerides 12/12/2021 70  0 - 149 mg/dL Final  ? HDL 12/12/2021 74  >39 mg/dL Final  ? VLDL Cholesterol Cal 12/12/2021 13  5 -  40 mg/dL Final  ? LDL Chol Calc (NIH) 12/12/2021 82  0 - 99 mg/dL Final  ? Chol/HDL Ratio 12/12/2021 2.3  0.0 - 4.4 ratio Final  ? Comment:                                   T. Chol/HDL Ratio ?                                            Men  Women ?                              1/2 Avg.Risk  3.4    3.3 ?                                  Avg.Risk  5.0    4.4 ?                               2X Avg.Risk  9.6    7.1 ?                               3X Avg.Risk 23.4   11.0 ?  ? Glucose 12/12/2021 252 (H)  70 - 99 mg/dL Final  ? BUN 12/12/2021 16  6 - 24 mg/dL Final  ? Creatinine, Ser 12/12/2021 1.03 (H)  0.57 - 1.00 mg/dL Final  ? eGFR 12/12/2021 65  >59 mL/min/1.73 Final  ? BUN/Creatinine Ratio 12/12/2021 16  9 - 23 Final  ? Sodium 12/12/2021 141  134 - 144 mmol/L Final  ? Potassium 12/12/2021 4.7  3.5 - 5.2 mmol/L Final  ? Chloride 12/12/2021 104  96 - 106 mmol/L Final  ? CO2 12/12/2021 23  20 - 29 mmol/L Final  ? Calcium 12/12/2021 9.4  8.7 - 10.2 mg/dL Final  ? Total Protein 12/12/2021 6.3  6.0 - 8.5 g/dL Final  ?  Albumin 12/12/2021 4.1  3.8 - 4.9 g/dL Final  ? Globulin, Total 12/12/2021 2.2  1.5 - 4.5 g/dL Final  ? Albumin/Globulin Ratio 12/12/2021 1.9  1.2 - 2.2 Final  ? Bilirubin Total 12/12/2021 0.2  0.0 - 1.2 mg/dL Final  ? Alkaline Phosphatase 12/12/2021 93  44 - 121 IU/L Final  ? AST 12/12/2021 17  0 - 40 IU/L Final  ? ALT 12/12/2021 14  0 - 32 IU/L Final  ? ? ?Allergies as of 02/06/2022   ?No Known Allergies ?  ? ?  ?Medication List  ?  ? ?  ? Accurate as of February 06, 2022  9:53 AM. If you have any questions, ask your nurse or doctor.  ?  ?  ? ?  ? ?benzonatate 100 MG capsule ?Commonly known as: TESSALON ?Take 1 capsule (100 mg total) by mouth 2 (two) times daily as needed for cough. ?  ?diazepam 10 MG tablet ?Commonly known as: VALIUM ?as needed. ?  ?Durezol 0.05 % Emul ?Generic drug: Difluprednate ?Apply 1 drop to eye daily at 8 pm. ?  ?Fish Oil 1000 MG Caps ?Take 1 capsule by mouth daily. ?  ?fluticasone 50  MCG/ACT nasal spray ?Commonly known as: FLONASE ?Place 2 sprays into both nostrils daily. ?  ?FreeStyle Libre 14 Day Reader Kerrin Mo ?Use Freestyle Libre reader to monitor blood sugars continuously. ?  ?FreeStyle Libre 2 Reader Devi ?USE FREESTYLE LIBRE 2 READER TO MONITOR BLOOD SUGARS CONTINUOUSLY ?  ?FreeStyle Libre 2 Sensor Misc ?USE ONE SENSOR ONCE EVERY 14 DAYS TO MONITOR BLOOD SUGARS ?  ?FreeStyle Precision Neo System w/Device Kit ?1 kit by Does not apply route 2 (two) times daily. ?  ?hydrocortisone cream 0.5 % ?Apply sparingly once daily to affected area for 5 days , then , as needed ?  ?lisinopril 2.5 MG tablet ?Commonly known as: ZESTRIL ?TAKE ONE (1) TABLET BY MOUTH EVERY DAY ?  ?metoCLOPramide 5 MG tablet ?Commonly known as: REGLAN ?TAKE 1 TO 2 TABLETS BY MOUTH EVERY 6 HOURS AS NEEDED FOR NAUSEA OR VOMITING. ?  ?montelukast 10 MG tablet ?Commonly known as: SINGULAIR ?TAKE ONE TABLET (10MG TOTAL) BY MOUTH ATBEDTIME ?  ?NovoLOG FlexPen 100 UNIT/ML FlexPen ?Generic drug: insulin aspart ?INJECT 8 TO 12 UNITS SUBCUTANEOUSLY BEFORE EACH MEAL AS DIRECTED. ADJUST PERSLIDING SCALE TO A MAXIMUM DOSE PER DAY OF 51 UNITS. ?  ?omeprazole 40 MG capsule ?Commonly known as: PRILOSEC ?TAKE ONE CAPSULE BY MOUTH 30 MINUTES PRIOR TO BREAKFAST AND SUPPER ?  ?ondansetron 4 MG tablet ?Commonly known as: ZOFRAN ?TAKE ONE TABLET BY MOUTH EVERY EIGHT HOURS AS NEEDED FOR NAUSEA OR VOMITING ?  ?OneTouch Delica Lancets 63F Misc ?USE FOUR TIMES A DAY AS DIRECTED. ?  ?OneTouch Verio test strip ?Generic drug: glucose blood ?Use to test blood sugar 2 times a day ?  ?polyethylene glycol 17 g packet ?Commonly known as: MIRALAX / GLYCOLAX ?Take 17 g by mouth daily as needed. ?  ?rosuvastatin 40 MG tablet ?Commonly known as: CRESTOR ?Take 1 tablet (40 mg total) by mouth daily. ?  ?sodium chloride 0.65 % nasal spray ?Commonly known as: OCEAN ?Place 1 spray into the nose as needed for congestion. ?  ?SPS 15 GM/60ML suspension ?Generic drug:  sodium polystyrene ?Take by mouth. ?  ?Sure Comfort Pen Needles 30G X 8 MM Misc ?Generic drug: Insulin Pen Needle ?4 boxes (88day supply) ?  ?Toujeo Max SoloStar 300 UNIT/ML Solostar Pen ?Generic drug: insulin gl

## 2022-02-09 ENCOUNTER — Other Ambulatory Visit: Payer: Self-pay | Admitting: Endocrinology

## 2022-02-09 DIAGNOSIS — E1140 Type 2 diabetes mellitus with diabetic neuropathy, unspecified: Secondary | ICD-10-CM

## 2022-02-18 ENCOUNTER — Ambulatory Visit (INDEPENDENT_AMBULATORY_CARE_PROVIDER_SITE_OTHER): Payer: PPO | Admitting: Gastroenterology

## 2022-03-08 ENCOUNTER — Encounter: Payer: Self-pay | Admitting: Endocrinology

## 2022-03-12 NOTE — Progress Notes (Signed)
No show

## 2022-03-19 DIAGNOSIS — H43391 Other vitreous opacities, right eye: Secondary | ICD-10-CM | POA: Diagnosis not present

## 2022-03-19 DIAGNOSIS — H35371 Puckering of macula, right eye: Secondary | ICD-10-CM | POA: Diagnosis not present

## 2022-03-19 DIAGNOSIS — E103593 Type 1 diabetes mellitus with proliferative diabetic retinopathy without macular edema, bilateral: Secondary | ICD-10-CM | POA: Diagnosis not present

## 2022-03-19 DIAGNOSIS — H4321 Crystalline deposits in vitreous body, right eye: Secondary | ICD-10-CM | POA: Diagnosis not present

## 2022-03-19 LAB — HM DIABETES EYE EXAM

## 2022-03-28 ENCOUNTER — Ambulatory Visit (INDEPENDENT_AMBULATORY_CARE_PROVIDER_SITE_OTHER): Payer: PPO | Admitting: Gastroenterology

## 2022-03-28 ENCOUNTER — Encounter (INDEPENDENT_AMBULATORY_CARE_PROVIDER_SITE_OTHER): Payer: Self-pay | Admitting: Gastroenterology

## 2022-04-10 ENCOUNTER — Encounter: Payer: Self-pay | Admitting: Family Medicine

## 2022-04-10 ENCOUNTER — Ambulatory Visit: Payer: PPO | Admitting: Endocrinology

## 2022-04-10 ENCOUNTER — Ambulatory Visit (INDEPENDENT_AMBULATORY_CARE_PROVIDER_SITE_OTHER): Payer: PPO | Admitting: Family Medicine

## 2022-04-10 ENCOUNTER — Other Ambulatory Visit (HOSPITAL_COMMUNITY)
Admission: RE | Admit: 2022-04-10 | Discharge: 2022-04-10 | Disposition: A | Discharge status: Home / Self Care | Payer: PPO | Source: Ambulatory Visit | Attending: Family Medicine | Admitting: Family Medicine

## 2022-04-10 VITALS — BP 125/72 | HR 82 | Resp 16 | Ht 68.0 in | Wt 127.1 lb

## 2022-04-10 DIAGNOSIS — Z124 Encounter for screening for malignant neoplasm of cervix: Secondary | ICD-10-CM | POA: Diagnosis not present

## 2022-04-10 DIAGNOSIS — Z794 Long term (current) use of insulin: Secondary | ICD-10-CM | POA: Diagnosis not present

## 2022-04-10 DIAGNOSIS — Z1151 Encounter for screening for human papillomavirus (HPV): Secondary | ICD-10-CM | POA: Diagnosis not present

## 2022-04-10 DIAGNOSIS — Z01419 Encounter for gynecological examination (general) (routine) without abnormal findings: Secondary | ICD-10-CM | POA: Insufficient documentation

## 2022-04-10 DIAGNOSIS — E1140 Type 2 diabetes mellitus with diabetic neuropathy, unspecified: Secondary | ICD-10-CM

## 2022-04-10 DIAGNOSIS — Z1231 Encounter for screening mammogram for malignant neoplasm of breast: Secondary | ICD-10-CM

## 2022-04-10 DIAGNOSIS — E559 Vitamin D deficiency, unspecified: Secondary | ICD-10-CM | POA: Diagnosis not present

## 2022-04-10 DIAGNOSIS — J302 Other seasonal allergic rhinitis: Secondary | ICD-10-CM

## 2022-04-10 DIAGNOSIS — E785 Hyperlipidemia, unspecified: Secondary | ICD-10-CM

## 2022-04-10 MED ORDER — CHLORPHENIRAMINE MALEATE 4 MG PO TABS: ORAL_TABLET | ORAL | 0 refills | Status: AC

## 2022-04-10 NOTE — Assessment & Plan Note (Signed)
Hyperlipidemia:Low fat diet discussed and encouraged.   Lipid Panel  Lab Results  Component Value Date   CHOL 169 12/12/2021   HDL 74 12/12/2021   LDLCALC 82 12/12/2021   TRIG 70 12/12/2021   CHOLHDL 2.3 12/12/2021     Controlled, no change in medication

## 2022-04-10 NOTE — Progress Notes (Signed)
Ashley Armstrong     MRN: [id number]      DOB: 1966/04/12   HPI Ashley Armstrong is here for follow up and re-evaluation of chronic medical conditions, medication management and review of any available recent lab and radiology data.  Preventive health is updated, specifically  Cancer screening and Immunization.   Questions or concerns regarding consultations or procedures which the PT has had in the interim are  addressed. The PT denies any adverse reactions to current medications since the last visit.  2 day h/o left ear pressure Denies polyuria, polydipsia, blurred vision , or hypoglycemic episodes. States avg blood sugar is 199  ROS Denies recent fever or chills. C/o sinus pressure, nasal congestion,  denies  sore throat. Denies chest congestion, productive cough or wheezing. Denies chest pains, palpitations and leg swelling Denies abdominal pain, nausea, vomiting,diarrhea or constipation.   Denies dysuria, frequency, hesitancy or incontinence. Denies joint pain, swelling and limitation in mobility. Denies headaches, seizures, numbness, or tingling. Deni uncontrolled depression, anxiety or insomnia. Denies skin break down or rash.   PE  BP 125/72   Pulse 82   Resp 16   Ht '5\' 8"'$  (1.727 m)   Wt 127 lb 1.9 oz (57.7 kg)   LMP 07/26/2013   SpO2 95%   BMI 19.33 kg/m   Patient alert and oriented and in no cardiopulmonary distress. Left TM dull, mild cervical adenityis bilaterally  Chest: Clear to auscultation bilaterally.  CVS: S1, S2 no murmurs, no S3.Regular rate.  ABD: Soft non tender.  Pelvic: No inguinal adenopathy. Uterus not enlarged,, atrophic no adnexal masses , no cervical motion or adnexal tenderness, no dischrge Ext: No edema  MS: Adequate ROM spine, shoulders, hips and knees.  Skin: Intact, no ulcerations or rash noted.  Psych: Good eye contact, normal affect. Memory intact not anxious or depressed appearing.  CNS: CN 2-12 intact, power,  normal throughout.no  focal deficits noted.   Assessment & Plan  Hyperlipidemia LDL goal <100 Hyperlipidemia:Low fat diet discussed and encouraged.   Lipid Panel  Lab Results  Component Value Date   CHOL 169 12/12/2021   HDL 74 12/12/2021   LDLCALC 82 12/12/2021   TRIG 70 12/12/2021   CHOLHDL 2.3 12/12/2021     Controlled, no change in medication   Type 2 diabetes mellitus with diabetic neuropathy, unspecified ([place]) Ashley Armstrong is reminded of the importance of commitment to daily physical activity for 30 minutes or more, as able and the need to limit carbohydrate intake to 30 to 60 grams per meal to help with blood sugar control.   The need to take medication as prescribed, test blood sugar as directed, and to call between visits if there is a concern that blood sugar is uncontrolled is also discussed.   Ashley Armstrong is reminded of the importance of daily foot exam, annual eye examination, and good blood sugar, blood pressure and cholesterol control.     Latest Ref Rng & Units 02/06/2022   10:17 AM 12/12/2021    9:11 AM 12/11/2021   10:10 AM 07/10/2021   10:26 AM 07/10/2021    9:55 AM  Diabetic Labs  HbA1c 4.0 - 5.6 %   9.5    8.6  C  Microalbumin 0.0 - 1.9 mg/dL 7.6        Micro/Creat Ratio 0.0 - 30.0 mg/g 8.1        Chol 100 - 199 mg/dL  169    150     HDL >39  mg/dL  74    64.00     Calc LDL 0 - 99 mg/dL  82    70     Triglycerides 0 - 149 mg/dL  70    78.0     Creatinine 0.57 - 1.00 mg/dL  1.03    1.04     GFR >60.00 mL/min    61.24       C Corrected result      04/10/2022    9:00 AM 02/06/2022    9:41 AM 12/11/2021    2:43 PM 12/11/2021    9:51 AM 07/10/2021    9:47 AM 04/23/2021    2:46 PM 03/21/2021    8:05 AM  BP/Weight  Systolic BP 979 480 165 537 482  707  Diastolic BP 72 72 66 62 70  77  Wt. (Lbs) 127.12 125.2 126 124.4 127.2 130 130  BMI 19.33 kg/m2 19.04 kg/m2 19.16 kg/m2 18.91 kg/m2 19.34 kg/m2 19.77 kg/m2 19.77 kg/m2      Latest Ref Rng & Units 12/11/2021    2:20 PM 01/09/2021    12:00 AM  Foot/eye exam completion dates  Eye Exam No Retinopathy  Retinopathy       Foot Form Completion  Done      This result is from an external source.      Managed by endo, updated lab today  Seasonal allergies Uncontrolled, needs to commit to daily meds , limited chlorpheniramine daily as needed, added  Encounter for Papanicolaou smear of cervix Pelvic exam as documented and pap sent

## 2022-04-10 NOTE — Assessment & Plan Note (Signed)
Ms. Vitug is reminded of the importance of commitment to daily physical activity for 30 minutes or more, as able and the need to limit carbohydrate intake to 30 to 60 grams per meal to help with blood sugar control.   The need to take medication as prescribed, test blood sugar as directed, and to call between visits if there is a concern that blood sugar is uncontrolled is also discussed.   Ms. Swanton is reminded of the importance of daily foot exam, annual eye examination, and good blood sugar, blood pressure and cholesterol control.     Latest Ref Rng & Units 02/06/2022   10:17 AM 12/12/2021    9:11 AM 12/11/2021   10:10 AM 07/10/2021   10:26 AM 07/10/2021    9:55 AM  Diabetic Labs  HbA1c 4.0 - 5.6 %   9.5    8.6  C  Microalbumin 0.0 - 1.9 mg/dL 7.6        Micro/Creat Ratio 0.0 - 30.0 mg/g 8.1        Chol 100 - 199 mg/dL  169    150     HDL >39 mg/dL  74    64.00     Calc LDL 0 - 99 mg/dL  82    70     Triglycerides 0 - 149 mg/dL  70    78.0     Creatinine 0.57 - 1.00 mg/dL  1.03    1.04     GFR >60.00 mL/min    61.24       C Corrected result      04/10/2022    9:00 AM 02/06/2022    9:41 AM 12/11/2021    2:43 PM 12/11/2021    9:51 AM 07/10/2021    9:47 AM 04/23/2021    2:46 PM 03/21/2021    8:05 AM  BP/Weight  Systolic BP 370 488 891 694 503  888  Diastolic BP 72 72 66 62 70  77  Wt. (Lbs) 127.12 125.2 126 124.4 127.2 130 130  BMI 19.33 kg/m2 19.04 kg/m2 19.16 kg/m2 18.91 kg/m2 19.34 kg/m2 19.77 kg/m2 19.77 kg/m2      Latest Ref Rng & Units 12/11/2021    2:20 PM 01/09/2021   12:00 AM  Foot/eye exam completion dates  Eye Exam No Retinopathy  Retinopathy       Foot Form Completion  Done      This result is from an external source.      Managed by endo, updated lab today

## 2022-04-10 NOTE — Patient Instructions (Addendum)
F/U end Septemebr, call if you need me sooner  CBC, tSH. Chem 7 and EGFr and HBA1C today  Please commit to daily montelukast and flonase, also chlorpheniramine is prescribed for 3 days then as needed, for ear pressure  Please schedule mammogram at checkout  Pap sent  Thanks for choosing [facility], we consider it a privelige to serve you.

## 2022-04-10 NOTE — Assessment & Plan Note (Signed)
Uncontrolled, needs to commit to daily meds , limited chlorpheniramine daily as needed, added

## 2022-04-10 NOTE — Assessment & Plan Note (Signed)
Pelvic exam as documented and pap sent

## 2022-04-11 ENCOUNTER — Other Ambulatory Visit (HOSPITAL_COMMUNITY): Payer: Self-pay

## 2022-04-11 LAB — CBC
Hematocrit: 39.9 % (ref 34.0–46.6)
Hemoglobin: 13.1 g/dL (ref 11.1–15.9)
MCH: 28.7 pg (ref 26.6–33.0)
MCHC: 32.8 g/dL (ref 31.5–35.7)
MCV: 87 fL (ref 79–97)
Platelets: 240 10*3/uL (ref 150–450)
RBC: 4.57 x10E6/uL (ref 3.77–5.28)
RDW: 12.9 % (ref 11.7–15.4)
WBC: 7.4 10*3/uL (ref 3.4–10.8)

## 2022-04-11 LAB — BMP8+EGFR
BUN/Creatinine Ratio: 13 (ref 9–23)
BUN: 14 mg/dL (ref 6–24)
CO2: 23 mmol/L (ref 20–29)
Calcium: 9.4 mg/dL (ref 8.7–10.2)
Chloride: 103 mmol/L (ref 96–106)
Creatinine, Ser: 1.12 mg/dL — ABNORMAL HIGH (ref 0.57–1.00)
Glucose: 244 mg/dL — ABNORMAL HIGH (ref 70–99)
Potassium: 5 mmol/L (ref 3.5–5.2)
Sodium: 140 mmol/L (ref 134–144)
eGFR: 58 mL/min/{1.73_m2} — ABNORMAL LOW (ref >59)

## 2022-04-11 LAB — HEMOGLOBIN A1C
Est. average glucose Bld gHb Est-mCnc: 209 mg/dL
Hgb A1c MFr Bld: 8.9 % — ABNORMAL HIGH (ref 4.8–5.6)

## 2022-04-11 LAB — CYTOLOGY - PAP
Adequacy: ABSENT
Comment: NEGATIVE
Diagnosis: NEGATIVE
High risk HPV: NEGATIVE

## 2022-04-11 LAB — TSH: TSH: 1.96 u[IU]/mL (ref 0.450–4.500)

## 2022-04-19 ENCOUNTER — Encounter: Payer: Self-pay | Admitting: Podiatry

## 2022-04-19 ENCOUNTER — Ambulatory Visit: Payer: PPO | Admitting: Podiatry

## 2022-04-19 DIAGNOSIS — M79674 Pain in right toe(s): Secondary | ICD-10-CM

## 2022-04-19 DIAGNOSIS — B351 Tinea unguium: Secondary | ICD-10-CM | POA: Diagnosis not present

## 2022-04-19 DIAGNOSIS — M129 Arthropathy, unspecified: Secondary | ICD-10-CM | POA: Diagnosis not present

## 2022-04-19 DIAGNOSIS — E1142 Type 2 diabetes mellitus with diabetic polyneuropathy: Secondary | ICD-10-CM

## 2022-04-19 DIAGNOSIS — M79675 Pain in left toe(s): Secondary | ICD-10-CM

## 2022-04-19 NOTE — Progress Notes (Signed)
This patient returns to my office for at risk foot care.  This patient requires this care by a professional since this patient will be at risk due to having DM with neuropathy.  This patient is unable to cut nails herself since the patient cannot reach her nails.These nails are painful walking and wearing shoes.  Patient has not been seen I over 7 months. This patient presents for at risk foot care today.  General Appearance  Alert, conversant and in no acute stress.  Vascular  Dorsalis pedis and posterior tibial  pulses are palpable  bilaterally.  Capillary return is within normal limits  bilaterally. Temperature is within normal limits  bilaterally.  Neurologic  Senn-Weinstein monofilament wire test absent   bilaterally. Muscle power within normal limits bilaterally.  Nails Thick disfigured discolored nails with subungual debris  from hallux to fifth toes bilaterally. No evidence of bacterial infection or drainage bilaterally.  Orthopedic  No limitations of motion  feet .  No crepitus or effusions noted.  No bony pathology or digital deformities noted.  HAV  B/L.  Skin  normotropic skin with no porokeratosis noted bilaterally.  No signs of infections or ulcers noted.     Onychomycosis  Pain in right toes  Pain in left toes  Consent was obtained for treatment procedures.   Mechanical debridement of nails 1-5  bilaterally performed with a nail nipper.  Filed with dremel without incident.    Return office visit   4 months                   Told patient to return for periodic foot care and evaluation due to potential at risk complications.   Gardiner Barefoot DPM

## 2022-04-24 ENCOUNTER — Ambulatory Visit (INDEPENDENT_AMBULATORY_CARE_PROVIDER_SITE_OTHER): Payer: PPO | Admitting: *Deleted

## 2022-04-24 DIAGNOSIS — Z0000 Encounter for general adult medical examination without abnormal findings: Secondary | ICD-10-CM | POA: Diagnosis not present

## 2022-04-24 NOTE — Patient Instructions (Signed)
Ashley Armstrong , Thank you for taking time to come for your Medicare Wellness Visit. I appreciate your ongoing commitment to your health goals. Please review the following plan we discussed and let me know if I can assist you in the future.   Screening recommendations/referrals: Colonoscopy: completed Mammogram: completed Recommended yearly ophthalmology/optometry visit for glaucoma screening and checkup Recommended yearly dental visit for hygiene and checkup  Vaccinations: Influenza vaccine: completed Pneumococcal vaccine: completed Tdap vaccine: completed Shingles vaccine: due now    Advanced directives: declined information  Conditions/risks identified: diabetes  Next appointment: 1 year   Preventive Care 40-64 Years, Female Preventive care refers to lifestyle choices and visits with your health care provider that can promote health and wellness. What does preventive care include? A yearly physical exam. This is also called an annual well check. Dental exams once or twice a year. Routine eye exams. Ask your health care provider how often you should have your eyes checked. Personal lifestyle choices, including: Daily care of your teeth and gums. Regular physical activity. Eating a healthy diet. Avoiding tobacco and drug use. Limiting alcohol use. Practicing safe sex. Taking low-dose aspirin daily starting at age 33. Taking vitamin and mineral supplements as recommended by your health care provider. What happens during an annual well check? The services and screenings done by your health care provider during your annual well check will depend on your age, overall health, lifestyle risk factors, and family history of disease. Counseling  Your health care provider may ask you questions about your: Alcohol use. Tobacco use. Drug use. Emotional well-being. Home and relationship well-being. Sexual activity. Eating habits. Work and work Statistician. Method of birth  control. Menstrual cycle. Pregnancy history. Screening  You may have the following tests or measurements: Height, weight, and BMI. Blood pressure. Lipid and cholesterol levels. These may be checked every 5 years, or more frequently if you are over 69 years old. Skin check. Lung cancer screening. You may have this screening every year starting at age 36 if you have a 30-pack-year history of smoking and currently smoke or have quit within the past 15 years. Fecal occult blood test (FOBT) of the stool. You may have this test every year starting at age 7. Flexible sigmoidoscopy or colonoscopy. You may have a sigmoidoscopy every 5 years or a colonoscopy every 10 years starting at age 31. Hepatitis C blood test. Hepatitis B blood test. Sexually transmitted disease (STD) testing. Diabetes screening. This is done by checking your blood sugar (glucose) after you have not eaten for a while (fasting). You may have this done every 1-3 years. Mammogram. This may be done every 1-2 years. Talk to your health care provider about when you should start having regular mammograms. This may depend on whether you have a family history of breast cancer. BRCA-related cancer screening. This may be done if you have a family history of breast, ovarian, tubal, or peritoneal cancers. Pelvic exam and Pap test. This may be done every 3 years starting at age 60. Starting at age 32, this may be done every 5 years if you have a Pap test in combination with an HPV test. Bone density scan. This is done to screen for osteoporosis. You may have this scan if you are at high risk for osteoporosis. Discuss your test results, treatment options, and if necessary, the need for more tests with your health care provider. Vaccines  Your health care provider may recommend certain vaccines, such as: Influenza vaccine. This is recommended every  year. Tetanus, diphtheria, and acellular pertussis (Tdap, Td) vaccine. You may need a Td booster  every 10 years. Zoster vaccine. You may need this after age 63. Pneumococcal 13-valent conjugate (PCV13) vaccine. You may need this if you have certain conditions and were not previously vaccinated. Pneumococcal polysaccharide (PPSV23) vaccine. You may need one or two doses if you smoke cigarettes or if you have certain conditions. Talk to your health care provider about which screenings and vaccines you need and how often you need them. This information is not intended to replace advice given to you by your health care provider. Make sure you discuss any questions you have with your health care provider. Document Released: 11/24/2015 Document Revised: 07/17/2016 Document Reviewed: 08/29/2015 Elsevier Interactive Patient Education  2017 [place] Prevention in the Home Falls can cause injuries. They can happen to people of all ages. There are many things you can do to make your home safe and to help prevent falls. What can I do on the outside of my home? Regularly fix the edges of walkways and driveways and fix any cracks. Remove anything that might make you trip as you walk through a door, such as a raised step or threshold. Trim any bushes or trees on the path to your home. Use bright outdoor lighting. Clear any walking paths of anything that might make someone trip, such as rocks or tools. Regularly check to see if handrails are loose or broken. Make sure that both sides of any steps have handrails. Any raised decks and porches should have guardrails on the edges. Have any leaves, snow, or ice cleared regularly. Use sand or salt on walking paths during winter. Clean up any spills in your garage right away. This includes oil or grease spills. What can I do in the bathroom? Use night lights. Install grab bars by the toilet and in the tub and shower. Do not use towel bars as grab bars. Use non-skid mats or decals in the tub or shower. If you need to sit down in the shower,  use a plastic, non-slip stool. Keep the floor dry. Clean up any water that spills on the floor as soon as it happens. Remove soap buildup in the tub or shower regularly. Attach bath mats securely with double-sided non-slip rug tape. Do not have throw rugs and other things on the floor that can make you trip. What can I do in the bedroom? Use night lights. Make sure that you have a light by your bed that is easy to reach. Do not use any sheets or blankets that are too big for your bed. They should not hang down onto the floor. Have a firm chair that has side arms. You can use this for support while you get dressed. Do not have throw rugs and other things on the floor that can make you trip. What can I do in the kitchen? Clean up any spills right away. Avoid walking on wet floors. Keep items that you use a lot in easy-to-reach places. If you need to reach something above you, use a strong step stool that has a grab bar. Keep electrical cords out of the way. Do not use floor polish or wax that makes floors slippery. If you must use wax, use non-skid floor wax. Do not have throw rugs and other things on the floor that can make you trip. What can I do with my stairs? Do not leave any items on the stairs. Make sure  that there are handrails on both sides of the stairs and use them. Fix handrails that are broken or loose. Make sure that handrails are as long as the stairways. Check any carpeting to make sure that it is firmly attached to the stairs. Fix any carpet that is loose or worn. Avoid having throw rugs at the top or bottom of the stairs. If you do have throw rugs, attach them to the floor with carpet tape. Make sure that you have a light switch at the top of the stairs and the bottom of the stairs. If you do not have them, ask someone to add them for you. What else can I do to help prevent falls? Wear shoes that: Do not have high heels. Have rubber bottoms. Are comfortable and fit you  well. Are closed at the toe. Do not wear sandals. If you use a stepladder: Make sure that it is fully opened. Do not climb a closed stepladder. Make sure that both sides of the stepladder are locked into place. Ask someone to hold it for you, if possible. Clearly mark and make sure that you can see: Any grab bars or handrails. First and last steps. Where the edge of each step is. Use tools that help you move around (mobility aids) if they are needed. These include: Canes. Walkers. Scooters. Crutches. Turn on the lights when you go into a dark area. Replace any light bulbs as soon as they burn out. Set up your furniture so you have a clear path. Avoid moving your furniture around. If any of your floors are uneven, fix them. If there are any pets around you, be aware of where they are. Review your medicines with your doctor. Some medicines can make you feel dizzy. This can increase your chance of falling. Ask your doctor what other things that you can do to help prevent falls. This information is not intended to replace advice given to you by your health care provider. Make sure you discuss any questions you have with your health care provider. Document Released: 08/24/2009 Document Revised: 04/04/2016 Document Reviewed: 12/02/2014 Elsevier Interactive Patient Education  2017 [employer].

## 2022-04-24 NOTE — Progress Notes (Signed)
Subjective:   Ashley Armstrong is a 56 y.o. female who presents for Medicare Annual (Subsequent) preventive examination.  I connected with  [name] on 04/24/22 by a audio enabled telemedicine application and verified that I am speaking with the correct person using two identifiers.  Patient Location: Home  Provider Location: Office/Clinic  I discussed the limitations of evaluation and management by telemedicine. The patient expressed understanding and agreed to proceed.   Review of Systems     [name] , Thank you for taking time to come for your Medicare Wellness Visit. I appreciate your ongoing commitment to your health goals. Please review the following plan we discussed and let me know if I can assist you in the future.   These are the goals we discussed:  Goals      DIET - INCREASE WATER INTAKE     Exercise 3x per week (30 min per time)     Recommend starting a routine exercise program at least 3 days a week for 30-45 minutes at a time as tolerated.       Patient Stated     Get mammogram      Patient Stated     Check on life alert to reduce anxiety about staying home alone         This is a list of the screening recommended for you and due dates:  Health Maintenance  Topic Date Due   Zoster (Shingles) Vaccine (1 of 2) Never done   COVID-19 Vaccine (4 - Moderna series) 01/20/2021   Eye exam for diabetics  01/09/2022   Flu Shot  06/11/2022   Hemoglobin A1C  10/10/2022   Complete foot exam   12/12/2022   Mammogram  06/30/2023   Tetanus Vaccine  06/16/2024   Pap Smear  04/10/2025   Colon Cancer Screening  05/09/2026   Hepatitis C Screening: USPSTF Recommendation to screen - Ages 18-79 yo.  Completed   HIV Screening  Completed   HPV Vaccine  Aged Out   .       Objective:    There were no vitals filed for this visit. There is no height or weight on file to calculate BMI.     05/18/2018    1:40 PM 03/11/2018    9:38 AM 02/12/2017    9:33 AM 11/05/2016     5:05 PM 05/09/2016   12:12 PM 07/27/2015    9:37 AM 04/08/2015    9:54 PM  Advanced Directives  Does Patient Have a Medical Advance Directive? _0  No No  Would patient like information on creating a medical advance directive?  No - Patient declined Yes (MAU/Ambulatory/Procedural Areas - Information given) No - Patient declined No - patient declined information  No - patient declined information    Current Medications (verified) Outpatient Encounter Medications as of 04/24/2022  Medication Sig   Blood Glucose Monitoring Suppl (FREESTYLE PRECISION NEO SYSTEM) w/Device KIT 1 kit by Does not apply route 2 (two) times daily.   chlorpheniramine (CHLOR-TRIMETON) 4 MG tablet Take one tablet by mouth once daily for 3 days, then as needed, for uncontrolled allergies and ear pressure   Continuous Blood Gluc Receiver (FREESTYLE LIBRE 2 READER) DEVI USE FREESTYLE LIBRE 2 READER TO MONITOR BLOOD SUGARS CONTINUOUSLY   Continuous Blood Gluc Sensor (FREESTYLE LIBRE 2 SENSOR) MISC USE ONE SENSOR ONCE EVERY 14 DAYS TO MONITOR BLOOD SUGARS   diazepam (VALIUM) 10 MG tablet as needed.    DUREZOL  0.05 % EMUL Apply 1 drop to eye daily at 8 pm.   fluticasone (FLONASE) 50 MCG/ACT nasal spray Place 2 sprays into both nostrils daily.   glucose blood (ONETOUCH VERIO) test strip USE AS BACKUP FOR SENSOR, ONCE A DAY AS NEEDED   insulin glargine, 2 Unit Dial, (TOUJEO MAX SOLOSTAR) 300 UNIT/ML Solostar Pen Inject 52 units in morning and 32 units in the afternoon. (Patient taking differently: Inject 60in am)   lisinopril (ZESTRIL) 2.5 MG tablet TAKE ONE (1) TABLET BY MOUTH EVERY DAY   metoCLOPramide (REGLAN) 5 MG tablet TAKE 1 TO 2 TABLETS BY MOUTH EVERY 6 HOURS AS NEEDED FOR NAUSEA OR VOMITING.   montelukast (SINGULAIR) 10 MG tablet TAKE ONE TABLET (10MG TOTAL) BY MOUTH ATBEDTIME   NOVOLOG FLEXPEN 100 UNIT/ML FlexPen INJECT 8 TO 12 UNITS SUBCUTANEOUSLY BEFORE EACH MEAL AS DIRECTED. ADJUST PERSLIDING SCALE TO A  MAXIMUM DOSE PER DAY OF 51 UNITS.   Omega-3 Fatty Acids (FISH OIL) 1000 MG CAPS Take 1 capsule by mouth daily.   omeprazole (PRILOSEC) 40 MG capsule TAKE ONE CAPSULE BY MOUTH 30 MINUTES PRIOR TO BREAKFAST AND SUPPER   ondansetron (ZOFRAN) 4 MG tablet TAKE ONE TABLET BY MOUTH EVERY EIGHT HOURS AS NEEDED FOR NAUSEA OR VOMITING   ONETOUCH DELICA LANCETS 37D MISC USE FOUR TIMES A DAY AS DIRECTED.   polyethylene glycol (MIRALAX / GLYCOLAX) 17 g packet Take 17 g by mouth daily as needed.   rosuvastatin (CRESTOR) 40 MG tablet Take 1 tablet (40 mg total) by mouth daily.   sodium chloride (OCEAN) 0.65 % nasal spray Place 1 spray into the nose as needed for congestion.   SPS 15 GM/60ML suspension Take by mouth.   SURE COMFORT PEN NEEDLES 30G X 8 MM MISC 4 boxes (88day supply)   vitamin B-12 (CYANOCOBALAMIN) 100 MCG tablet Take 1 tablet by mouth daily.   VITAMIN D PO Take 1 Dose by mouth daily.   No facility-administered encounter medications on file as of 04/24/2022.    Allergies (verified) Patient has no known allergies.   History: Past Medical History:  Diagnosis Date   Anxiety    Dyslipidemia    Gastroparesis due to DM ([place]) OCT 2014   75% AT 2 HRS, GLU >    H/O eye surgery 01/2018   Headache(784.0)    IDDM (insulin dependent diabetes mellitus)    Neuropathy    Nicotine addiction    TIA (transient ischemic attack)    Dr. Merlene Laughter   Past Surgical History:  Procedure Laterality Date   Cataract surgery Bilateral 01/20/2017, 02/03/2017   COLONOSCOPY N/A 05/09/2016   Dr. One[place] Alar: 2 hyperplastic polyps removed.  Next colonoscopy in 2027   ESOPHAGOGASTRODUODENOSCOPY N/A 08/31/2013   SLF: 1. Earky satiety nausea may be due to Gastroparesis/pyloric channel stenosis. 2. small hiatal hernia 3. Moderate erosive gastritis.    EYE SURGERY  01/2018   laser surgery bilateral eye     Family History  Problem Relation Age of Onset   Hypertension Mother    Diabetes Mother    Hyperlipidemia Mother     Rosacea Mother    Hypertension Father    Diabetes Brother    Breast cancer Maternal Aunt    Colon cancer Neg Hx    Social History   Socioeconomic History   Marital status: Single    Spouse name: Not on file   Number of children: Not on file   Years of education: Not on file   Highest education level: Not  on file  Occupational History   Occupation: disabled  Tobacco Use   Smoking status: Former    Packs/day: 1.50    Years: 24.00    Total pack years: 36.00    Types: Cigarettes    Quit date: 08/24/2011    Years since quitting: 10.6   Smokeless tobacco: Never  Vaping Use   Vaping Use: Never used  Substance and Sexual Activity   Alcohol use: No   Drug use: No   Sexual activity: Not Currently    Birth control/protection: None  Other Topics Concern   Not on file  Social History Narrative   Disabled, lives with a room mate   Social Determinants of Health   Financial Resource Strain: Low Risk  (04/23/2021)   Overall Financial Resource Strain (CARDIA)    Difficulty of Paying Living Expenses: Not very hard  Food Insecurity: No Food Insecurity (04/23/2021)   Hunger Vital Sign    Worried About Running Out of Food in the Last Year: Never true    [place] in the Last Year: Never true  Transportation Needs: No Transportation Needs (04/23/2021)   PRAPARE - Hydrologist (Medical): No    Lack of Transportation (Non-Medical): No  Physical Activity: Inactive (04/23/2021)   Exercise Vital Sign    Days of Exercise per Week: 0 days    Minutes of Exercise per Session: 0 min  Stress: Stress Concern Present (04/23/2021)   [place]    Feeling of Stress : Very much  Social Connections: Moderately Integrated (04/23/2021)   Social Connection and Isolation Panel [NHANES]    Frequency of Communication with Friends and Family: More than three times a week    Frequency of Social Gatherings with  Friends and Family: More than three times a week    Attends Religious Services: More than 4 times per year    Active Member of [employer] or Organizations: Yes    Attends Music therapist: More than 4 times per year    Marital Status: Never married    Tobacco Counseling Counseling given: Not Answered   Clinical Intake:                 Diabetic?Nutrition Risk Assessment:  Has the patient had any N/V/D within the last 2 months?  No  Does the patient have any non-healing wounds?  No  Has the patient had any unintentional weight loss or weight gain?  No   Diabetes:  Is the patient diabetic?  Yes  If diabetic, was a CBG obtained today?  No  Did the patient bring in their glucometer from home?  No  How often do you monitor your CBG's? Check with freestyle libre.   Financial Strains and Diabetes Management:  Are you having any financial strains with the device, your supplies or your medication? No .  Does the patient want to be seen by Chronic Care Management for management of their diabetes?  No  Would the patient like to be referred to a Nutritionist or for Diabetic Management?  No   Diabetic Exams:  Diabetic Eye Exam: Completed 2023.   Diabetic Foot Exam: Completed 12-12-21.            Activities of Daily Living     No data to display           Patient Care Team: Fayrene Helper, MD as PCP - General (Family Medicine) Oneida Alar,  Marga Melnick, MD (Inactive) as Consulting Physician (Gastroenterology) Sherlynn Stalls, MD as Consulting Physician (Ophthalmology) Darleen Crocker, MD as Consulting Physician (Ophthalmology) Herminio Commons, MD (Inactive) as Attending Physician (Cardiology) Landis Martins, DPM as Consulting Physician (Podiatry)  Indicate any recent Medical Services you may have received from other than [facility] providers in the past year (date may be approximate).     Assessment:   This is a routine wellness examination for  De[place].  Hearing/Vision screen No results found.  Dietary issues and exercise activities discussed:     Goals Addressed   None   Depression Screen    01/01/2022   10:37 AM 12/11/2021    2:44 PM 04/23/2021    2:50 PM 10/31/2020    8:41 AM 10/31/2020    8:06 AM 06/15/2020    1:25 PM 03/20/2020    2:52 PM  PHQ 2/9 Scores  PHQ - 2 Score _0 0 1 4  PHQ- 9 Score  _1 Fall Risk    04/10/2022    9:01 AM 01/01/2022   10:37 AM 12/11/2021    2:44 PM 04/23/2021    2:55 PM 10/31/2020    8:06 AM  Fall Risk   Falls in the past year? 0 0 0 0 0  Number falls in past yr: 0 0 0 0   Injury with Fall? 0 0 0 0   Risk for fall due to :  No Fall Risks     Follow up  Falls evaluation completed       FALL RISK PREVENTION PERTAINING TO THE HOME:  Any stairs in or around the home? No  If so, are there any without handrails? No  Home free of loose throw rugs in walkways, pet beds, electrical cords, etc? Yes  Adequate lighting in your home to reduce risk of falls? Yes   ASSISTIVE DEVICES UTILIZED TO PREVENT FALLS:  Life alert? No  Use of a cane, walker or w/c? No  Grab bars in the bathroom? Yes  Shower chair or bench in shower? Yes  Elevated toilet seat or a handicapped toilet? Yes   Cognitive Function:        04/23/2021    2:57 PM 03/20/2020    2:54 PM 03/18/2019    2:29 PM 03/11/2018    9:42 AM 02/12/2017    9:39 AM  6CIT Screen  What Year? 0 points 0 points 0 points 0 points 0 points  What month? 0 points 0 points 0 points 0 points 0 points  What time? 0 points 0 points 0 points 0 points 0 points  Count back from 20 0 points 0 points 0 points 0 points 0 points  Months in reverse 0 points 0 points 0 points 0 points 0 points  Repeat phrase 0 points 0 points 0 points 0 points 0 points  Total Score 0 points 0 points 0 points 0 points 0 points    Immunizations Immunization History  Administered Date(s) Administered   H1N1 10/24/2008   Influenza Whole 09/24/2007, 08/09/2010    Influenza,inj,Quad PF,6+ Mos 07/29/2013, 10/18/2014, 08/29/2015, 09/23/2016, 07/16/2017, 10/05/2018, 07/26/2019, 07/06/2020, 12/12/2021   Influenza-Unspecified 08/08/2017, 10/07/2018   Moderna Sars-Covid-2 Vaccination 03/01/2020, 04/04/2020, 11/25/2020   Pneumococcal Polysaccharide-23 03/19/2013, 06/02/2018   Td 04/11/2004   Tdap 06/16/2014    TDAP status: Up to date  Flu Vaccine status: Up to date   Covid-19 vaccine status: Completed vaccines  Qualifies for Shingles Vaccine? Yes  Zostavax completed No   Shingrix Completed?: No.    Education has been provided regarding the importance of this vaccine. Patient has been advised to call insurance company to determine out of pocket expense if they have not yet received this vaccine. Advised may also receive vaccine at local pharmacy or Health Dept. Verbalized acceptance and understanding.  Screening Tests Health Maintenance  Topic Date Due   Zoster Vaccines- Shingrix (1 of 2) Never done   COVID-19 Vaccine (4 - Moderna series) 01/20/2021   OPHTHALMOLOGY EXAM  01/09/2022   INFLUENZA VACCINE  06/11/2022   HEMOGLOBIN A1C  10/10/2022   FOOT EXAM  12/12/2022   MAMMOGRAM  06/30/2023   TETANUS/TDAP  06/16/2024   PAP SMEAR-Modifier  04/10/2025   COLONOSCOPY (Pts 45-57yr Insurance coverage will need to be confirmed)  05/09/2026   Hepatitis C Screening  Completed   HIV Screening  Completed   HPV VACCINES  Aged Out    Health Maintenance  Health Maintenance Due  Topic Date Due   Zoster Vaccines- Shingrix (1 of 2) Never done   COVID-19 Vaccine (4 - Moderna series) 01/20/2021   OPHTHALMOLOGY EXAM  01/09/2022    Colorectal cancer screening: Type of screening: Colonoscopy. Completed 05-09-16. Repeat every 10 years  Mammogram status: Completed 06-29-21. Repeat every year  Lung Cancer Screening: (Low Dose CT Chest recommended if Age 55-80years, 30 pack-year currently smoking OR have quit w/in 15years.) does not qualify.   Lung Cancer  Screening Referral: na  Additional Screening:  Hepatitis C Screening: does qualify; Completed 03-21-21  Vision Screening: Recommended annual ophthalmology exams for early detection of glaucoma and other disorders of the eye. Is the patient up to date with their annual eye exam?  Yes  Who is the provider or what is the name of the office in which the patient attends annual eye exams? Dr SBaird CancerIf pt is not established with a provider, would they like to be referred to a provider to establish care? No .   Dental Screening: Recommended annual dental exams for proper oral hygiene  Community Resource Referral / Chronic Care Management: CRR required this visit?  No   CCM required this visit?  No      Plan:     I have personally reviewed and noted the following in the patient's chart:   Medical and social history Use of alcohol, tobacco or illicit drugs  Current medications and supplements including opioid prescriptions.  Functional ability and status Nutritional status Physical activity Advanced directives List of other physicians Hospitalizations, surgeries, and ER visits in previous 12 months Vitals Screenings to include cognitive, depression, and falls Referrals and appointments  In addition, I have reviewed and discussed with patient certain preventive protocols, quality metrics, and best practice recommendations. A written personalized care plan for preventive services as well as general preventive health recommendations were provided to patient.     SShelda Altes CMA   04/24/2022   Nurse Notes:  Ms. CFull, Thank you for taking time to come for your Medicare Wellness Visit. I appreciate your ongoing commitment to your health goals. Please review the following plan we discussed and let me know if I can assist you in the future.   These are the goals we discussed:  Goals      DIET - INCREASE WATER INTAKE     Exercise 3x per week (30 min per time)     Recommend  starting a routine exercise program at least 3 days a week  for 30-45 minutes at a time as tolerated.       Patient Stated     Get mammogram      Patient Stated     Check on life alert to reduce anxiety about staying home alone         This is a list of the screening recommended for you and due dates:  Health Maintenance  Topic Date Due   Zoster (Shingles) Vaccine (1 of 2) Never done   COVID-19 Vaccine (4 - Moderna series) 01/20/2021   Eye exam for diabetics  01/09/2022   Flu Shot  06/11/2022   Hemoglobin A1C  10/10/2022   Complete foot exam   12/12/2022   Mammogram  06/30/2023   Tetanus Vaccine  06/16/2024   Pap Smear  04/10/2025   Colon Cancer Screening  05/09/2026   Hepatitis C Screening: USPSTF Recommendation to screen - Ages 18-79 yo.  Completed   HIV Screening  Completed   HPV Vaccine  Aged Out

## 2022-04-29 ENCOUNTER — Other Ambulatory Visit: Payer: Self-pay | Admitting: Endocrinology

## 2022-05-16 ENCOUNTER — Other Ambulatory Visit: Payer: Self-pay | Admitting: Gastroenterology

## 2022-05-16 ENCOUNTER — Other Ambulatory Visit: Payer: Self-pay | Admitting: Family Medicine

## 2022-05-16 DIAGNOSIS — F4323 Adjustment disorder with mixed anxiety and depressed mood: Secondary | ICD-10-CM | POA: Diagnosis not present

## 2022-05-20 ENCOUNTER — Other Ambulatory Visit: Payer: Self-pay | Admitting: Family Medicine

## 2022-06-24 ENCOUNTER — Other Ambulatory Visit: Payer: Self-pay | Admitting: Endocrinology

## 2022-07-02 ENCOUNTER — Encounter: Payer: Self-pay | Admitting: Endocrinology

## 2022-07-02 ENCOUNTER — Ambulatory Visit: Payer: PPO | Admitting: Endocrinology

## 2022-07-02 VITALS — BP 118/68 | HR 80 | Ht 68.0 in | Wt 125.8 lb

## 2022-07-02 DIAGNOSIS — E1065 Type 1 diabetes mellitus with hyperglycemia: Secondary | ICD-10-CM | POA: Diagnosis not present

## 2022-07-02 DIAGNOSIS — E1043 Type 1 diabetes mellitus with diabetic autonomic (poly)neuropathy: Secondary | ICD-10-CM | POA: Diagnosis not present

## 2022-07-02 LAB — POCT GLYCOSYLATED HEMOGLOBIN (HGB A1C): Hemoglobin A1C: 8.5 % — AB (ref 4.0–5.6)

## 2022-07-02 MED ORDER — LEVEMIR FLEXTOUCH 100 UNIT/ML ~~LOC~~ SOPN: 70.0000 [IU] | PEN_INJECTOR | Freq: Every day | SUBCUTANEOUS | 2 refills | Status: DC

## 2022-07-02 NOTE — Progress Notes (Signed)
Patient ID: Ashley Armstrong, female   DOB: 05/07/1966, 56 y.o.   MRN: [id number]           Reason for Appointment: Follow-up for Type 1 Diabetes  Referring physician: Dr. [name]   History of Present Illness:          Date of diagnosis of type 1 diabetes mellitus: Age 3       Background history:  She has been on insulin practically since her diagnosis was made.  Initially she had symptoms of weight loss and blurred vision For several years has been on Lantus and NovoLog but usually has had poor control with A1c mostly between 8-10%  Recent history:     INSULIN regimen is: TOUJEO 60 units in a.m.  NovoLog at mealtimes mostly 12-14 units    Current management, blood sugar patterns and problems identified:  Her A1c is again higher than target but slightly better at 8.5  She was told to switch once a day Toujeo to Levemir twice a day on the last visit after she finished but she did not call  Also because of some misunderstanding she missed her last appointment  She has been repeatedly advised to take higher doses of NovoLog to control her daytime hyperglycemia but she still does not change her regimen  As before she has significantly lower blood sugars early morning including occasional hypoglycemia but readings well over 200 consistently the rest of the day  At times will have more significant meals spikes but basically her Premeal readings tend to stay high  She is taking her mealtime dose 5 minutes before eating She thinks she has gastroparesis but does not have any nausea or other symptoms No recent weight change She again refuses to consider the insulin pump          Meal times are:  Breakfast is 7 AM at Lunch:  PM dinner:3 PM  Typical meal intake: Breakfast is sometimes high fat but otherwise eggs and toast.  Lunch usually a mixed meal and evening meal may be a sandwich with bland food.  Snacks will be cheese and crackers Usually avoiding drinks with sugar             Exercise: None     Dates of Recording: Last 2 weeks  Sensor description: Libre 2  HYPERGLYCEMIA is present throughout the day on an average with only improved blood sugars overnight Although moderate variability is present her blood sugars are usually rising after about 4 AM and usually continuing to average in the low 200 range the rest of the day HYPERGLYCEMIA episodes are present at all times except between about 1 AM-5 AM.  This is variable from day-to-day Hyperglycemia may occur with blood sugars as high as 366 including after breakfast HYPOGLYCEMIA is noticed only occasionally between 2-5 AM overnight only POSTPRANDIAL readings are rising variably with mealtime spikes noticed at all times although the rise itself may be variable Previously readings are generally averaging at least 200 at all times LOWEST average blood sugar 146 from 2 AM-4 AM and HIGHEST 243 from 8-10 AM  Results statistics:  CGM use % of time 97  2-week average/GV 207/33  Time in range      34%, was 26  % Time Above 180 38  % Time above 250 26  % Time Below 70 2     Previously:  CGM use % of time   2-week average/GV 229/30  Time in range    26    %  %  Time Above 180 34  % Time above 250 40  % Time Below 70      PRE-MEAL Fasting Lunch Dinner Bedtime Overall  Glucose range:       Averages: 212  236  229   POST-MEAL PC Breakfast PC Lunch PC Dinner  Glucose range:     Averages: 234 279 248      Dietician visit, most recent: 11/18  Weight history:  Wt Readings from Last 3 Encounters:  07/02/22 125 lb 12.8 oz (57.1 kg)  04/10/22 127 lb 1.9 oz (57.7 kg)  02/06/22 125 lb 3.2 oz (56.8 kg)    Glycemic control:   Lab Results  Component Value Date   HGBA1C 8.5 (A) 07/02/2022   HGBA1C 8.9 (H) 04/10/2022   HGBA1C 9.5 (A) 12/11/2021   Lab Results  Component Value Date   MICROALBUR 7.6 (H) 02/06/2022   LDLCALC 82 12/12/2021   CREATININE 1.12 (H) 04/10/2022   Lab Results  Component  Value Date   MICRALBCREAT 8.1 02/06/2022    No results found for: "FRUCTOSAMINE"  Office Visit on 07/02/2022  Component Date Value Ref Range Status   Hemoglobin A1C 07/02/2022 8.5 (A)  4.0 - 5.6 % Final    Allergies as of 07/02/2022   No Known Allergies      Medication List        Accurate as of July 02, 2022  1:49 PM. If you have any questions, ask your nurse or doctor.          STOP taking these medications    Toujeo Max SoloStar 300 UNIT/ML Solostar Pen Generic drug: insulin glargine (2 Unit Dial) Stopped by: Elayne Snare, MD       TAKE these medications    chlorpheniramine 4 MG tablet Commonly known as: CHLOR-TRIMETON Take one tablet by mouth once daily for 3 days, then as needed, for uncontrolled allergies and ear pressure   diazepam 10 MG tablet Commonly known as: VALIUM as needed.   Durezol 0.05 % Emul Generic drug: Difluprednate Apply 1 drop to eye daily at 8 pm.   Fish Oil 1000 MG Caps Take 1 capsule by mouth daily.   fluticasone 50 MCG/ACT nasal spray Commonly known as: FLONASE Place 2 sprays into both nostrils daily.   FreeStyle Libre 2 Reader Kerrin Mo USE FREESTYLE LIBRE 2 READER TO MONITOR BLOOD SUGARS CONTINUOUSLY   FreeStyle Libre 2 Sensor Misc USE ONE SENSOR ONCE EVERY 14 DAYS TO MONITOR BLOOD SUGARS   FreeStyle Precision Neo System w/Device Kit 1 kit by Does not apply route 2 (two) times daily.   Levemir FlexTouch 100 UNIT/ML FlexPen Generic drug: insulin detemir Inject 70 Units into the skin daily. Started by: Elayne Snare, MD   lisinopril 2.5 MG tablet Commonly known as: ZESTRIL TAKE ONE (1) TABLET BY MOUTH EVERY DAY   metoCLOPramide 5 MG tablet Commonly known as: REGLAN TAKE 1 TO 2 TABLETS BY MOUTH EVERY 6 HOURS AS NEEDED FOR NAUSEA OR VOMITING.   montelukast 10 MG tablet Commonly known as: SINGULAIR TAKE ONE TABLET (10MG TOTAL) BY MOUTH ATBEDTIME   NovoLOG FlexPen 100 UNIT/ML FlexPen Generic drug: insulin aspart INJECT  8 TO 12 UNITS SUBCUTANEOUSLY BEFORE EACH MEAL AS DIRECTED. ADJUST PERSLIDING SCALE TO A MAXIMUM DOSE PER DAY OF 51 UNITS.   omeprazole 40 MG capsule Commonly known as: PRILOSEC TAKE ONE CAPSULE BY MOUTH 30 MINUTES PRIOR TO BREAKFAST AND SUPPER   ondansetron 4 MG tablet Commonly known as: ZOFRAN TAKE ONE TABLET BY MOUTH EVERY  EIGHT HOURS AS NEEDED FOR NAUSEA OR VOMITING   OneTouch Delica Lancets 40J Misc USE FOUR TIMES A DAY AS DIRECTED.   OneTouch Verio test strip Generic drug: glucose blood USE AS BACKUP FOR SENSOR, ONCE A DAY AS NEEDED   polyethylene glycol 17 g packet Commonly known as: MIRALAX / GLYCOLAX Take 17 g by mouth daily as needed.   rosuvastatin 40 MG tablet Commonly known as: CRESTOR TAKE ONE (1) TABLET BY MOUTH EVERY DAY   sodium chloride 0.65 % nasal spray Commonly known as: OCEAN Place 1 spray into the nose as needed for congestion.   SPS 15 GM/60ML suspension Generic drug: sodium polystyrene Take by mouth.   Sure Comfort Pen Needles 30G X 8 MM Misc Generic drug: Insulin Pen Needle 4 boxes (88day supply)   vitamin B-12 100 MCG tablet Commonly known as: CYANOCOBALAMIN Take 1 tablet by mouth daily.   VITAMIN D PO Take 1 Dose by mouth daily.        Allergies: No Known Allergies  Past Medical History:  Diagnosis Date   Anxiety    Dyslipidemia    Gastroparesis due to DM ([place]) OCT 2014   75% AT 2 HRS, GLU >    H/O eye surgery 01/2018   Headache(784.0)    IDDM (insulin dependent diabetes mellitus)    Neuropathy    Nicotine addiction    TIA (transient ischemic attack)    Dr. Merlene Laughter    Past Surgical History:  Procedure Laterality Date   Cataract surgery Bilateral 01/20/2017, 02/03/2017   COLONOSCOPY N/A 05/09/2016   Dr. Oneida Alar: 2 hyperplastic polyps removed.  Next colonoscopy in 2027   ESOPHAGOGASTRODUODENOSCOPY N/A 08/31/2013   SLF: 1. Earky satiety nausea may be due to Gastroparesis/pyloric channel stenosis. 2. small hiatal hernia 3.  Moderate erosive gastritis.    EYE SURGERY  01/2018   laser surgery bilateral eye      Family History  Problem Relation Age of Onset   Hypertension Mother    Diabetes Mother    Hyperlipidemia Mother    Rosacea Mother    Hypertension Father    Diabetes Brother    Breast cancer Maternal Aunt    Colon cancer Neg Hx     Social History:  reports that she quit smoking about 10 years ago. Her smoking use included cigarettes. She has a 36.00 pack-year smoking history. She has never used smokeless tobacco. She reports that she does not drink alcohol and does not use drugs.   Review of Systems   Lipid history: Followed by PCP, currently on Crestor 40 mg Labs as follows She has aortic atherosclerosis on  CT scan   Lab Results  Component Value Date   CHOL 169 12/12/2021   HDL 74 12/12/2021   LDLCALC 82 12/12/2021   TRIG 70 12/12/2021   CHOLHDL 2.3 12/12/2021           Blood pressure history:  BP Readings from Last 3 Encounters:  07/02/22 118/68  04/10/22 125/72  02/06/22 108/72    Most recent eye exam was in 3/22  Most recent foot exam: 2/23  GASTROPARESIS: Has symptoms of fullness and nausea, will take Zofran as needed  Followed by gastroenterologist  She is on Reglan before meals, taking 5 mg 30 minutes before eating No significant nausea now  Usually eating 3 meals a day but may skip breakfast sometimes  Currently known complications of diabetes: Gastroparesis, neuropathy, retinopathy, no nephropathy  LABS:  Office Visit on 07/02/2022  Component Date Value  Ref Range Status   Hemoglobin A1C 07/02/2022 8.5 (A)  4.0 - 5.6 % Final    Physical Examination:  BP 118/68   Pulse 80   Ht _0  (1.727 m)   Wt 125 lb 12.8 oz (57.1 kg)   LMP 07/19/2013   SpO2 98%   BMI 19.13 kg/m        ASSESSMENT:  Diabetes type 1, poorly controlled with multiple complications  See history of present illness for detailed discussion of current diabetes management, blood sugar  patterns from her freestyle libre CGM download and problems identified  Her A1c is consistently over 8% and now 8.5   She is on basal bolus insulin with Toujeo once a day and mealtime NovoLog  She continues to have the same blood sugar patterns as always and needs better control of her daytime hyperglycemia either with more NovoLog and also more insulin delivery for basal requirement during the day Since she will not use the insulin pump she can benefit from Levemir and she did not switch as directed on the last visit Discussed her blood sugar patterns and need for better control to prevent further complications  Even though she is using the freestyle libre she is somehow reluctant to apply even a tubeless insulin pump  LIPIDS will need follow-up with her PCP   PLAN:   Again discussed her that she needs to take at least 50% more NovoLog for each meal regardless of Premeal blood sugar  She will take likely as much as 22 units of NovoLog at meals based on her meal size however she will need to adjust this time and keep the blood sugars from being over 200 after any meal Given her an instruction sheet on the dosage adjustment titrated to postprandial target of 130-200 At least at breakfast she can take a few units less if she is having more protein Meanwhile reduce Toujeo to 32 Advised her to follow instructions given on her visit summary Also may be given a trial of LEVEMIR on the next prescription for which she can use a relatively larger dose during the day and less in the evening We will start her on Levemir 50 units in the morning and 10 in the evening  Discussed the advantages of using the freestyle libre 3 with continuous display and using a smaller sensor This will be ordered through [facility] if she can get the reader covered  We will also discuss the OmniPod insulin pump on the next visit again  More regular follow-up, she can come back in 3 months   Patient  Instructions  18-20 Novolog at meals  Take 56 Toujeo   After Toujeo use 50 Levemir in am and go up to 60 if supper time sugar still >200 Also take 10 Units at bedtime  Total visit time including counseling = 30 minutes  Elayne Snare 07/02/2022, 1:49 PM   Note: This office note was prepared with Dragon voice recognition system technology. Any transcriptional errors that result from this process are unintentional.

## 2022-07-02 NOTE — Patient Instructions (Addendum)
18-20 Novolog at meals  Take 56 Toujeo   After Toujeo use 50 Levemir in am and go up to 60 if supper time sugar still >200 Also take 10 Units at bedtime

## 2022-07-05 ENCOUNTER — Encounter: Payer: Self-pay | Admitting: Endocrinology

## 2022-07-10 ENCOUNTER — Encounter: Payer: Self-pay | Admitting: *Deleted

## 2022-07-10 ENCOUNTER — Encounter: Payer: Self-pay | Admitting: Gastroenterology

## 2022-07-10 ENCOUNTER — Telehealth: Payer: Self-pay | Admitting: *Deleted

## 2022-07-10 ENCOUNTER — Ambulatory Visit (INDEPENDENT_AMBULATORY_CARE_PROVIDER_SITE_OTHER): Payer: PPO | Admitting: Gastroenterology

## 2022-07-10 ENCOUNTER — Other Ambulatory Visit (HOSPITAL_COMMUNITY): Payer: Self-pay

## 2022-07-10 VITALS — BP 104/62 | HR 78 | Temp 97.8°F | Ht 67.0 in | Wt 126.0 lb

## 2022-07-10 DIAGNOSIS — K5900 Constipation, unspecified: Secondary | ICD-10-CM

## 2022-07-10 DIAGNOSIS — R1012 Left upper quadrant pain: Secondary | ICD-10-CM | POA: Diagnosis not present

## 2022-07-10 DIAGNOSIS — K219 Gastro-esophageal reflux disease without esophagitis: Secondary | ICD-10-CM | POA: Diagnosis not present

## 2022-07-10 DIAGNOSIS — K3184 Gastroparesis: Secondary | ICD-10-CM

## 2022-07-10 MED ORDER — PANTOPRAZOLE SODIUM 40 MG PO TBEC: 40.0000 mg | DELAYED_RELEASE_TABLET | Freq: Two times a day (BID) | ORAL | 1 refills | Status: DC

## 2022-07-10 NOTE — Patient Instructions (Signed)
Stop omeprazole. Start pantoprazole '40mg'$  twice daily before meal. Once symptoms better controlled, try cutting back to once daily if tolerated.  Continue miralax one capful daily. CT scan of your abdomen. We will be in touch with results as available.

## 2022-07-10 NOTE — Progress Notes (Signed)
GI Office Note    Referring Provider: Fayrene Helper, MD Primary Care Physician:  Fayrene Helper, MD  Primary Gastroenterologist: Elon Alas. Abbey Chatters, DO   Chief Complaint   Chief Complaint  Patient presents with   Follow-up    States that everything is still the same, notices that she gets heartburn when she eats bread. Pt is also having a hard time gaining weight.     History of Present Illness   Ashley Armstrong is a 56 y.o. female presenting today at the request of Dr. Moshe Cipro for chronic LUQ, gastroparesis, bloating. Last seen November 2021.  History of constipation, GERD, gastroparesis, left lower quadrant pain.  Patient states she continues to have GI issues. Tendency towards constipation. Takes Miralax every day. Has a BM about every other day but is satisfied with output. Sometimes stools can be hard. About twice per month she will have urgent loose stool without warning, associated with incontinence. Diarrhea seems to happen after eating salads. No melena, brbpr.   Chronic LUQ pain pressure/pulling pain, intermittent. She wonders if it is related to constipation. Was supposed to have CT earlier this year but could not afford. Her weight has been stable past year. She is actively trying to gain weight but has not been able to. She has a good appetite and says she eats all the time.   She denies nausea or vomiting. She uses reglan as needed. Last prescription filled 01/2022. She is on Omeprazole BID. Often burps up taste of yesterdays food. Has a lot of bloating and feels like she can't take a deep breath. Has heartburn intermittently on current regimen. Sometimes even to bread. No dysphagia.  Last colonoscopy in 2017, she had 2 hyperplastic polyps removed.  Next colonoscopy due in 10 years. EGD in 2014 with pyloric channel stenosis s/p dilation, gastritis.     CT abdomen pelvis with contrast December 2021: IMPRESSION: 1. No acute findings or explanation for the  patient's symptoms. 2. Pancreatic atrophy. 3. Prominent stool in the rectum. 4. Aortic Atherosclerosis (ICD10-I70.0).   Electronically Signed   By: Richardean Sale M.D.   On: 10/30/2020 08:47  Medications   Current Outpatient Medications  Medication Sig Dispense Refill   Blood Glucose Monitoring Suppl (FREESTYLE PRECISION NEO SYSTEM) w/Device KIT 1 kit by Does not apply route 2 (two) times daily. 1 kit 0   chlorpheniramine (CHLOR-TRIMETON) 4 MG tablet Take one tablet by mouth once daily for 3 days, then as needed, for uncontrolled allergies and ear pressure 14 tablet 0   Continuous Blood Gluc Receiver (FREESTYLE LIBRE 2 READER) DEVI USE FREESTYLE LIBRE 2 READER TO MONITOR BLOOD SUGARS CONTINUOUSLY 1 each 0   Continuous Blood Gluc Sensor (FREESTYLE LIBRE 2 SENSOR) MISC USE ONE SENSOR ONCE EVERY 14 DAYS TO MONITOR BLOOD SUGARS 2 each 2   diazepam (VALIUM) 10 MG tablet as needed.      DUREZOL 0.05 % EMUL Apply 1 drop to eye daily at 8 pm.     fluticasone (FLONASE) 50 MCG/ACT nasal spray Place 2 sprays into both nostrils daily. 16 g 6   glucose blood (ONETOUCH VERIO) test strip USE AS BACKUP FOR SENSOR, ONCE A DAY AS NEEDED 50 each 2   insulin detemir (LEVEMIR FLEXTOUCH) 100 UNIT/ML FlexPen Inject 70 Units into the skin daily. 15 mL 2   lisinopril (ZESTRIL) 2.5 MG tablet TAKE ONE (1) TABLET BY MOUTH EVERY DAY 90 tablet 1   metoCLOPramide (REGLAN) 5 MG tablet TAKE 1  TO 2 TABLETS BY MOUTH EVERY 6 HOURS AS NEEDED FOR NAUSEA OR VOMITING. 120 tablet 5   montelukast (SINGULAIR) 10 MG tablet TAKE ONE TABLET (10MG TOTAL) BY MOUTH ATBEDTIME 90 tablet 1   NOVOLOG FLEXPEN 100 UNIT/ML FlexPen INJECT 8 TO 12 UNITS SUBCUTANEOUSLY BEFORE EACH MEAL AS DIRECTED. ADJUST PERSLIDING SCALE TO A MAXIMUM DOSE PER DAY OF 51 UNITS. 45 mL 3   Omega-3 Fatty Acids (FISH OIL) 1000 MG CAPS Take 1 capsule by mouth daily.     omeprazole (PRILOSEC) 40 MG capsule TAKE ONE CAPSULE BY MOUTH 30 MINUTES PRIOR TO BREAKFAST AND  SUPPER 180 capsule 3   ondansetron (ZOFRAN) 4 MG tablet TAKE ONE TABLET BY MOUTH EVERY EIGHT HOURS AS NEEDED FOR NAUSEA OR VOMITING 30 tablet 3   ONETOUCH DELICA LANCETS 62H MISC USE FOUR TIMES A DAY AS DIRECTED. 100 each 1   polyethylene glycol (MIRALAX / GLYCOLAX) 17 g packet Take 17 g by mouth daily as needed.     rosuvastatin (CRESTOR) 40 MG tablet TAKE ONE (1) TABLET BY MOUTH EVERY DAY 90 tablet 3   sodium chloride (OCEAN) 0.65 % nasal spray Place 1 spray into the nose as needed for congestion. 30 mL 12   SPS 15 GM/60ML suspension Take by mouth.     SURE COMFORT PEN NEEDLES 30G X 8 MM MISC 4 boxes (88day supply) 4 packet 3   vitamin B-12 (CYANOCOBALAMIN) 100 MCG tablet Take 1 tablet by mouth daily.     VITAMIN D PO Take 1 Dose by mouth daily.     No current facility-administered medications for this visit.    Allergies   Allergies as of 07/10/2022   (No Known Allergies)      Review of Systems   General: Negative for anorexia, weight loss, fever, chills, fatigue, weakness. ENT: Negative for hoarseness, difficulty swallowing , nasal congestion. CV: Negative for chest pain, angina, palpitations, dyspnea on exertion, peripheral edema.  Respiratory: Negative for dyspnea at rest, dyspnea on exertion, cough, sputum, wheezing.  GI: See history of present illness. GU:  Negative for dysuria, hematuria, urinary incontinence, urinary frequency, nocturnal urination.  Endo: Negative for unusual weight change.     Physical Exam   BP 104/62 (BP Location: Left Arm, Patient Position: Sitting, Cuff Size: Normal)   Pulse 78   Temp 97.8 F (36.6 C) (Temporal)   Ht _0  (1.702 m)   Wt 126 lb (57.2 kg)   LMP 07/19/2013   SpO2 99%   BMI 19.73 kg/m    General: Well-nourished, well-developed in no acute distress.  Eyes: No icterus. Mouth: Oropharyngeal mucosa moist and pink , no lesions erythema or exudate. Lungs: Clear to auscultation bilaterally.  Heart: Regular rate and rhythm, no  murmurs rubs or gallops.  Abdomen: Bowel sounds are normal, fullness in epigastric region with tenderness in epig/luq. no hepatosplenomegaly or masses, no abdominal bruits or hernia , no rebound or guarding.  Rectal: not performed  Extremities: No lower extremity edema. No clubbing or deformities. Neuro: Alert and oriented x 4   Skin: Warm and dry, no jaundice.   Psych: Alert and cooperative, normal mood and affect.  Labs   August 2023: Hemoglobin A1c 8.5  Lab Results  Component Value Date   TSH 1.960 04/10/2022   Lab Results  Component Value Date   WBC 7.4 04/10/2022   HGB 13.1 04/10/2022   HCT 39.9 04/10/2022   MCV 87 04/10/2022   PLT 240 04/10/2022   Lab Results  Component  Value Date   ALT 14 12/12/2021   AST 17 12/12/2021   ALKPHOS 93 12/12/2021   BILITOT 0.2 12/12/2021   Lab Results  Component Value Date   CREATININE 1.12 (H) 04/10/2022   BUN 14 04/10/2022   NA 140 04/10/2022   K 5.0 04/10/2022   CL 103 04/10/2022   CO2 23 04/10/2022    Imaging Studies   No results found.  Assessment   GERD: still with some breakthrough on omeprazole 62m BID. Likely exacerbated by gastroparesis  Gastroparesis: no N/V but suspect bloating is due to slow stomach emptying. She has history of pyloric stenosis requiring dilation in 2017 and still with abnormal GES after. Cannot rule out recurrent pyloric stenosis but less likely given no N/V.   Constipation: reasonably well controlled. Sporadid episodes of diarrhea likely related to certain foods (salads). Continue to monitor.   LUQ pain: chronic and intermittent. ?due to gastroparesis vs constipation vs pancreas. History of pancreas atrophy on last CT in 2021. Recommend repeat imaging to rule out malignancy.    PLAN   Stop omeprazole. Start pantoprazole 471mBID. Try to back down to once daily when symptoms controlled.  Continue miralax daily. CT A/P with contrast.    LeLaureen OchsLeBobby RumpfMHSunfish LakePAFort YukonGastroenterology Associates

## 2022-07-10 NOTE — Telephone Encounter (Signed)
Patient scheduled for CT on 07/18/22 at 4:30 pm. Pt advised to arrive at [facility] at 4:15 on this day. Patient instructed to have nothing to eat or drink 4 hours prior to procedure and pick up contrast from radiology.

## 2022-07-17 ENCOUNTER — Other Ambulatory Visit: Payer: Self-pay

## 2022-07-17 DIAGNOSIS — E1065 Type 1 diabetes mellitus with hyperglycemia: Secondary | ICD-10-CM

## 2022-07-17 MED ORDER — FREESTYLE LIBRE 2 SENSOR MISC: 2 refills | Status: DC

## 2022-07-18 ENCOUNTER — Ambulatory Visit (HOSPITAL_COMMUNITY)
Admission: RE | Admit: 2022-07-18 | Discharge: 2022-07-18 | Disposition: A | Discharge status: Home / Self Care | Payer: PPO | Source: Ambulatory Visit | Attending: Gastroenterology | Admitting: Gastroenterology

## 2022-07-18 DIAGNOSIS — K5900 Constipation, unspecified: Secondary | ICD-10-CM | POA: Insufficient documentation

## 2022-07-18 DIAGNOSIS — R1012 Left upper quadrant pain: Secondary | ICD-10-CM | POA: Insufficient documentation

## 2022-07-18 DIAGNOSIS — K219 Gastro-esophageal reflux disease without esophagitis: Secondary | ICD-10-CM | POA: Insufficient documentation

## 2022-07-18 DIAGNOSIS — K3184 Gastroparesis: Secondary | ICD-10-CM | POA: Diagnosis not present

## 2022-07-18 DIAGNOSIS — I700 Atherosclerosis of aorta: Secondary | ICD-10-CM | POA: Diagnosis not present

## 2022-07-18 LAB — POCT I-STAT CREATININE: Creatinine, Ser: 1.1 mg/dL — ABNORMAL HIGH (ref 0.44–1.00)

## 2022-07-18 MED ORDER — IOHEXOL 300 MG/ML  SOLN
100.0000 mL | Freq: Once | INTRAMUSCULAR | Status: AC | PRN
  Administered 2022-07-18: 80 mL via INTRAVENOUS

## 2022-07-22 ENCOUNTER — Telehealth: Payer: Self-pay

## 2022-07-22 NOTE — Telephone Encounter (Signed)
Patient called wanting to know the results to her recent CT scan that was done on 07/18/22.

## 2022-07-24 NOTE — Telephone Encounter (Signed)
See result note.  

## 2022-07-31 ENCOUNTER — Telehealth (INDEPENDENT_AMBULATORY_CARE_PROVIDER_SITE_OTHER): Payer: PPO | Admitting: Gastroenterology

## 2022-07-31 ENCOUNTER — Encounter: Payer: Self-pay | Admitting: Gastroenterology

## 2022-07-31 ENCOUNTER — Telehealth: Payer: Self-pay

## 2022-07-31 VITALS — Ht 67.5 in | Wt 126.0 lb

## 2022-07-31 DIAGNOSIS — Z8719 Personal history of other diseases of the digestive system: Secondary | ICD-10-CM | POA: Insufficient documentation

## 2022-07-31 DIAGNOSIS — K5900 Constipation, unspecified: Secondary | ICD-10-CM

## 2022-07-31 DIAGNOSIS — R1012 Left upper quadrant pain: Secondary | ICD-10-CM | POA: Diagnosis not present

## 2022-07-31 DIAGNOSIS — R140 Abdominal distension (gaseous): Secondary | ICD-10-CM

## 2022-07-31 NOTE — Progress Notes (Signed)
Primary Care Physician:  Fayrene Helper, MD Primary GI:  Elon Alas. Abbey Chatters, DO Patient Location: Home  Provider Location: [place] office  Reason for Phone Visit:  Chief Complaint  Patient presents with   discuss results    Discuss CT results.   Persons present on the phone encounter, with roles: Patient, myself (provider),Tammy Clifton, CMA(updated meds and allergies)  Total time (minutes) spent on medical discussion: 12 minutes  Due to COVID-19, visit was conducted using telephonic method (no video was available).  Visit was requested by patient.  Virtual Visit via Telephone only  I connected with Ms. Radziewicz on 08/02/22 at  3:30 PM EDT by telephone and verified that I am speaking with the correct person using two identifiers.   I discussed the limitations, risks, security and privacy concerns of performing an evaluation and management service by telephone and the availability of in person appointments. I also discussed with the patient that there may be a patient responsible charge related to this service. The patient expressed understanding and agreed to proceed.   HPI:   Patient is a pleasant 56 y/o female who presents for telephone visit regarding CT results.  She was last seen in the office back on August 30.  He has a history of chronic left upper quadrant pain, gastroparesis, bloating, GERD, constipation.  After last office visit, patient completed CT to evaluate her abdominal pain.  She decided to make some dietary changes because of atherosclerosis seen on her CT.  She started consuming low-fat diet.  Chicken breast, ground [place].  Avoiding fatty meats.  She notes less bloating and gas.  Still likes to eat salad but always bloats her.  She has a history of gastroparesis, occasionally uses Reglan but not frequently.  Also with history of pyloric stenosis requiring dilation in 2017, still with abnormal GES afterwards.  Now she has more constipation with change in diet.   However she does have intermittent diarrhea when she does she noticed grease in the stool.  She notes her sugars have been much better on the new diet.  Originally diagnosed with type 2 diabetes at age 88.   CT abdomen pelvis with contrast showed stable diffuse pancreatic atrophy and aortic atherosclerosis.     Current Outpatient Medications  Medication Sig Dispense Refill   Blood Glucose Monitoring Suppl (FREESTYLE PRECISION NEO SYSTEM) w/Device KIT 1 kit by Does not apply route 2 (two) times daily. 1 kit 0   chlorpheniramine (CHLOR-TRIMETON) 4 MG tablet Take one tablet by mouth once daily for 3 days, then as needed, for uncontrolled allergies and ear pressure 14 tablet 0   Continuous Blood Gluc Receiver (FREESTYLE LIBRE 2 READER) DEVI USE FREESTYLE LIBRE 2 READER TO MONITOR BLOOD SUGARS CONTINUOUSLY 1 each 0   Continuous Blood Gluc Sensor (FREESTYLE LIBRE 2 SENSOR) MISC USE ONE SENSOR ONCE EVERY 14 DAYS TO MONITOR BLOOD SUGARS 2 each 2   diazepam (VALIUM) 10 MG tablet as needed.      DUREZOL 0.05 % EMUL Apply 1 drop to eye daily at 8 pm.     fluticasone (FLONASE) 50 MCG/ACT nasal spray Place 2 sprays into both nostrils daily. 16 g 6   glucose blood (ONETOUCH VERIO) test strip USE AS BACKUP FOR SENSOR, ONCE A DAY AS NEEDED 50 each 2   insulin detemir (LEVEMIR FLEXTOUCH) 100 UNIT/ML FlexPen Inject 70 Units into the skin daily. 15 mL 2   lisinopril (ZESTRIL) 2.5 MG tablet TAKE ONE (1) TABLET BY  MOUTH EVERY DAY 90 tablet 1   metoCLOPramide (REGLAN) 5 MG tablet TAKE 1 TO 2 TABLETS BY MOUTH EVERY 6 HOURS AS NEEDED FOR NAUSEA OR VOMITING. 120 tablet 5   montelukast (SINGULAIR) 10 MG tablet TAKE ONE TABLET (10MG TOTAL) BY MOUTH ATBEDTIME 90 tablet 1   NOVOLOG FLEXPEN 100 UNIT/ML FlexPen INJECT 8 TO 12 UNITS SUBCUTANEOUSLY BEFORE EACH MEAL AS DIRECTED. ADJUST PERSLIDING SCALE TO A MAXIMUM DOSE PER DAY OF 51 UNITS. 45 mL 3   Omega-3 Fatty Acids (FISH OIL) 1000 MG CAPS Take 1 capsule by mouth daily.      ondansetron (ZOFRAN) 4 MG tablet TAKE ONE TABLET BY MOUTH EVERY EIGHT HOURS AS NEEDED FOR NAUSEA OR VOMITING 30 tablet 3   ONETOUCH DELICA LANCETS 15A MISC USE FOUR TIMES A DAY AS DIRECTED. 100 each 1   pantoprazole (PROTONIX) 40 MG tablet Take 1 tablet (40 mg total) by mouth 2 (two) times daily before a meal. Once symptoms controlled, cut back to once daily if tolerated. 180 tablet 1   polyethylene glycol (MIRALAX / GLYCOLAX) 17 g packet Take 17 g by mouth daily as needed.     rosuvastatin (CRESTOR) 40 MG tablet TAKE ONE (1) TABLET BY MOUTH EVERY DAY 90 tablet 3   sodium chloride (OCEAN) 0.65 % nasal spray Place 1 spray into the nose as needed for congestion. 30 mL 12   SURE COMFORT PEN NEEDLES 30G X 8 MM MISC 4 boxes (88day supply) 4 packet 3   vitamin B-12 (CYANOCOBALAMIN) 100 MCG tablet Take 1 tablet by mouth daily.     VITAMIN D PO Take 1 Dose by mouth daily.     No current facility-administered medications for this visit.    Past Medical History:  Diagnosis Date   Anxiety    Dyslipidemia    Gastroparesis due to DM ([place]) OCT 2014   75% AT 2 HRS, GLU >    H/O eye surgery 01/2018   Headache(784.0)    IDDM (insulin dependent diabetes mellitus)    Neuropathy    Nicotine addiction    TIA (transient ischemic attack)    Dr. Merlene Laughter    Past Surgical History:  Procedure Laterality Date   Cataract surgery Bilateral 01/20/2017, 02/03/2017   COLONOSCOPY N/A 05/09/2016   Dr. Oneida Alar: 2 hyperplastic polyps removed.  Next colonoscopy in 2027   ESOPHAGOGASTRODUODENOSCOPY N/A 08/31/2013   SLF: 1. Earky satiety nausea may be due to Gastroparesis/pyloric channel stenosis. 2. small hiatal hernia 3. Moderate erosive gastritis.    EYE SURGERY  01/2018   laser surgery bilateral eye      Family History  Problem Relation Age of Onset   Hypertension Mother    Diabetes Mother    Hyperlipidemia Mother    Rosacea Mother    Hypertension Father    Diabetes Brother    Breast cancer Maternal Aunt     Colon cancer Neg Hx     Social History   Socioeconomic History   Marital status: Single    Spouse name: Not on file   Number of children: Not on file   Years of education: Not on file   Highest education level: Not on file  Occupational History   Occupation: disabled  Tobacco Use   Smoking status: Former    Packs/day: 1.50    Years: 24.00    Total pack years: 36.00    Types: Cigarettes    Quit date: 08/24/2011    Years since quitting: 10.9   Smokeless tobacco:  Never  Vaping Use   Vaping Use: Never used  Substance and Sexual Activity   Alcohol use: No   Drug use: No   Sexual activity: Not Currently    Birth control/protection: None  Other Topics Concern   Not on file  Social History Narrative   Disabled, lives with a room mate   Social Determinants of Health   Financial Resource Strain: Low Risk  (04/24/2022)   Overall Financial Resource Strain (CARDIA)    Difficulty of Paying Living Expenses: Not hard at all  Food Insecurity: No Food Insecurity (04/24/2022)   Hunger Vital Sign    Worried About Running Out of Food in the Last Year: Never true    Ran Out of Food in the Last Year: Never true  Transportation Needs: No Transportation Needs (04/24/2022)   PRAPARE - Hydrologist (Medical): No    Lack of Transportation (Non-Medical): No  Physical Activity: Insufficiently Active (04/24/2022)   Exercise Vital Sign    Days of Exercise per Week: 3 days    Minutes of Exercise per Session: 10 min  Stress: Stress Concern Present (04/24/2022)   [place]    Feeling of Stress : To some extent  Social Connections: Moderately Isolated (04/24/2022)   Social Connection and Isolation Panel [NHANES]    Frequency of Communication with Friends and Family: More than three times a week    Frequency of Social Gatherings with Friends and Family: More than three times a week    Attends Religious  Services: More than 4 times per year    Active Member of [employer] or Organizations: No    Attends Archivist Meetings: Never    Marital Status: Never married  Intimate Partner Violence: Not At Risk (04/24/2022)   Humiliation, Afraid, Rape, and Kick questionnaire    Fear of Current or Ex-Partner: No    Emotionally Abused: No    Physically Abused: No    Sexually Abused: No    ROS:  General: Negative for anorexia, weight loss, fever, chills, fatigue, weakness. Eyes: Negative for vision changes.  ENT: Negative for hoarseness, difficulty swallowing , nasal congestion. CV: Negative for chest pain, angina, palpitations, dyspnea on exertion, peripheral edema.  Respiratory: Negative for dyspnea at rest, dyspnea on exertion, cough, sputum, wheezing.  GI: See history of present illness. GU:  Negative for dysuria, hematuria, urinary incontinence, urinary frequency, nocturnal urination.  MS: Negative for joint pain, low back pain.  Derm: Negative for rash or itching.  Neuro: Negative for weakness, abnormal sensation, seizure, frequent headaches, memory loss, confusion.  Psych: Negative for anxiety, depression, suicidal ideation, hallucinations.  Endo: Negative for unusual weight change.  Heme: Negative for bruising or bleeding. Allergy: Negative for rash or hives.   Observations/Objective:   Assessment and Plan:  GERD: some occasional breakthrough symptoms but otherwise doing ok.  Gastroparesis: may be contributing to her bloating, does not have vomiting or need frequent reglan.  Constipation: doing well at this time. Occasional diarrhea with grease noted. History of pancreatic atrophy on CT. No other signs of EPI. Continue to monitor Left upper quadrant pain: possible related to pancreas vs delayed gastric emptying vs pyloric stenosis  Continue low-fat diet.  Continue to consume adequate calories to maintain weight. Monitor for greasy/oily stools and diarrhea.  If increased  frequency, would consider trial of pancreatic enzymes. EGD with Dr. Abbey Chatters.  ASA 3.  I have discussed the risks, alternatives, benefits with  regards to but not limited to the risk of reaction to medication, bleeding, infection, perforation and the patient is agreeable to proceed. Written consent to be obtained. Continue pantoprazole 69m daily to twice daily.  Follow Up Instructions:    I discussed the assessment and treatment plan with the patient. The patient was provided an opportunity to ask questions and all were answered. The patient agreed with the plan and demonstrated an understanding of the instructions. AVS mailed to patient's home address.   The patient was advised to call back or seek an in-person evaluation if the symptoms worsen or if the condition fails to improve as anticipated.  I provided 12 minutes of non-face-to-face time during this encounter.   LNeil Crouch PA-C

## 2022-07-31 NOTE — Telephone Encounter (Signed)
Ashley Armstrong, you are scheduled for a virtual visit with your provider today.  Just as we do with appointments in the office, we must obtain your consent to participate.  Your consent will be active for this visit and any virtual visit you may have with one of our providers in the next 365 days.  If you have a MyChart account, I can also send a copy of this consent to you electronically.  All virtual visits are billed to your insurance company just like a traditional visit in the office.  As this is a virtual visit, video technology does not allow for your provider to perform a traditional examination.  This may limit your provider's ability to fully assess your condition.  If your provider identifies any concerns that need to be evaluated in person or the need to arrange testing such as labs, EKG, etc, we will make arrangements to do so.  Although advances in technology are sophisticated, we cannot ensure that it will always work on either your end or our end.  If the connection with a video visit is poor, we may have to switch to a telephone visit.  With either a video or telephone visit, we are not always able to ensure that we have a secure connection.   I need to obtain your verbal consent now.   Are you willing to proceed with your visit today? Yes.

## 2022-07-31 NOTE — Patient Instructions (Signed)
Continue low-fat diet but consume enough to maintain your weight.  Monitor for greasy/oily stools and diarrhea. If you notice this more often than usual, please let me know and we would consider trying pancreatic enzymes. We will arrange for you to have an upper endoscopy in the near future.

## 2022-08-01 ENCOUNTER — Telehealth: Payer: Self-pay | Admitting: *Deleted

## 2022-08-01 NOTE — Telephone Encounter (Signed)
LMOVM to call back to schedule EGD with Dr. Abbey Chatters, ASA 3

## 2022-08-02 ENCOUNTER — Encounter: Payer: Self-pay | Admitting: Gastroenterology

## 2022-08-05 ENCOUNTER — Encounter: Payer: Self-pay | Admitting: *Deleted

## 2022-08-05 NOTE — Telephone Encounter (Signed)
LMTRC

## 2022-08-05 NOTE — Telephone Encounter (Signed)
Letter will be mailed.

## 2022-08-09 ENCOUNTER — Ambulatory Visit (INDEPENDENT_AMBULATORY_CARE_PROVIDER_SITE_OTHER): Payer: PPO | Admitting: Family Medicine

## 2022-08-09 ENCOUNTER — Encounter: Payer: Self-pay | Admitting: Family Medicine

## 2022-08-09 VITALS — BP 123/78 | HR 79 | Ht 66.0 in | Wt 123.0 lb

## 2022-08-09 DIAGNOSIS — F411 Generalized anxiety disorder: Secondary | ICD-10-CM

## 2022-08-09 DIAGNOSIS — Z794 Long term (current) use of insulin: Secondary | ICD-10-CM | POA: Diagnosis not present

## 2022-08-09 DIAGNOSIS — F418 Other specified anxiety disorders: Secondary | ICD-10-CM

## 2022-08-09 DIAGNOSIS — E1140 Type 2 diabetes mellitus with diabetic neuropathy, unspecified: Secondary | ICD-10-CM

## 2022-08-09 DIAGNOSIS — E785 Hyperlipidemia, unspecified: Secondary | ICD-10-CM

## 2022-08-09 DIAGNOSIS — K219 Gastro-esophageal reflux disease without esophagitis: Secondary | ICD-10-CM | POA: Diagnosis not present

## 2022-08-09 DIAGNOSIS — Z23 Encounter for immunization: Secondary | ICD-10-CM | POA: Diagnosis not present

## 2022-08-09 NOTE — Patient Instructions (Signed)
F/U in end January, call if you need me sooner  hBA1c 11/23 OR AFTER, ALSO FASTING LIPID, CMP AND Egfr  PLEASE RESCHEDULE MAMMOGRAM AT CHECKOUT  NURSE PLS GET FROM [facility] DATES PT GOT SHINGRIX VACCINES (2) AND DOCUMENT  FLU VACCINE today  You are referred to diabetic educator  Thanks for choosing  Primary Care, we consider it a privelige to serve you.

## 2022-08-11 ENCOUNTER — Encounter: Payer: Self-pay | Admitting: Family Medicine

## 2022-08-11 NOTE — Assessment & Plan Note (Signed)
Improved control, using spirituality as a great tool

## 2022-08-11 NOTE — Assessment & Plan Note (Signed)
Updated lab needed at/ before next visit. Managed by Endo Ashley Armstrong is reminded of the importance of commitment to daily physical activity for 30 minutes or more, as able and the need to limit carbohydrate intake to 30 to 60 grams per meal to help with blood sugar control.   The need to take medication as prescribed, test blood sugar as directed, and to call between visits if there is a concern that blood sugar is uncontrolled is also discussed.   Ashley Armstrong is reminded of the importance of daily foot exam, annual eye examination, and good blood sugar, blood pressure and cholesterol control.     Latest Ref Rng & Units 07/18/2022    4:40 PM 07/02/2022   11:14 AM 04/10/2022    9:33 AM 02/06/2022   10:17 AM 12/12/2021    9:11 AM  Diabetic Labs  HbA1c 4.0 - 5.6 %  8.5  8.9     Microalbumin 0.0 - 1.9 mg/dL    7.6    Micro/Creat Ratio 0.0 - 30.0 mg/g    8.1    Chol 100 - 199 mg/dL     169   HDL >39 mg/dL     74   Calc LDL 0 - 99 mg/dL     82   Triglycerides 0 - 149 mg/dL     70   Creatinine 0.44 - 1.00 mg/dL 1.10   1.12   1.03       08/09/2022    8:35 AM 07/31/2022    3:36 PM 07/10/2022    7:56 AM 07/02/2022   10:50 AM 04/10/2022    9:00 AM 02/06/2022    9:41 AM 12/11/2021    2:43 PM  BP/Weight  Systolic BP 067  703 403 524 818 590  Diastolic BP 78  62 68 72 72 66  Wt. (Lbs) 123 126 126 125.8 127.12 125.2 126  BMI 19.85 kg/m2 19.44 kg/m2 19.73 kg/m2 19.13 kg/m2 19.33 kg/m2 19.04 kg/m2 19.16 kg/m2      Latest Ref Rng & Units 03/19/2022   12:00 AM 12/11/2021    2:20 PM  Foot/eye exam completion dates  Eye Exam No Retinopathy Retinopathy       Foot Form Completion   Done     This result is from an external source.

## 2022-08-11 NOTE — Assessment & Plan Note (Signed)
Improved, mnaaged by Psych, not suicidal or homicidal, reports improved mood control and behavior

## 2022-08-11 NOTE — Assessment & Plan Note (Signed)
Controlled, no change in medication  

## 2022-08-11 NOTE — Progress Notes (Signed)
Ashley Armstrong     MRN: [id number]      DOB: [birth year]   HPI Ashley Armstrong is here for follow up and re-evaluation of chronic medical conditions, medication management and review of any available recent lab and radiology data.  Preventive health is updated, specifically  Cancer screening and Immunization.   Questions or concerns regarding consultations or procedures which the PT has had in the interim are  addressed. The PT denies any adverse reactions to current medications since the last visit.  There are no new concerns.  There are no specific complaints  Denies polyuria, polydipsia, blurred vision , or hypoglycemic episodes.FBG around 190   ROS Denies recent fever or chills. Denies sinus pressure, nasal congestion, ear pain or sore throat. Denies chest congestion, productive cough or wheezing. Denies chest pains, palpitations and leg swelling Denies abdominal pain, nausea, vomiting,diarrhea or constipation.   Denies dysuria, frequency, hesitancy or incontinence. Denies joint pain, swelling and limitation in mobility. Denies headaches, seizures, numbness, or tingling. Denies uncontrolled  depression, anxiety or insomnia. Denies skin break down or rash.   PE  BP 123/78 (BP Location: Left Arm, Patient Position: Sitting, Cuff Size: Normal)   Pulse 79   Ht '5\' 6"'$  (1.676 m)   Wt 123 lb (55.8 kg)   LMP 07/19/2013   SpO2 98%   BMI 19.85 kg/m   Patient alert and oriented and in no cardiopulmonary distress.  HEENT: No facial asymmetry, EOMI,     Neck supple .  Chest: Clear to auscultation bilaterally.  CVS: S1, S2 no murmurs, no S3.Regular rate.  ABD: Soft non tender.   Ext: No edema  MS: Adequate ROM spine, shoulders, hips and knees.  Skin: Intact, no ulcerations or rash noted.  Psych: Good eye contact, normal affect. Memory intact not anxious or depressed appearing.  CNS: CN 2-12 intact, power,  normal throughout.no focal deficits noted.   Assessment &  Plan  Depression with anxiety Improved, mnaaged by Psych, not suicidal or homicidal, reports improved mood control and behavior  Generalized anxiety disorder Improved control, using spirituality as a great tool  Type 2 diabetes mellitus with diabetic neuropathy, unspecified ([place]) Updated lab needed at/ before next visit. Managed by Endo Ashley Armstrong is reminded of the importance of commitment to daily physical activity for 30 minutes or more, as able and the need to limit carbohydrate intake to 30 to 60 grams per meal to help with blood sugar control.   The need to take medication as prescribed, test blood sugar as directed, and to call between visits if there is a concern that blood sugar is uncontrolled is also discussed.   Ashley Armstrong is reminded of the importance of daily foot exam, annual eye examination, and good blood sugar, blood pressure and cholesterol control.     Latest Ref Rng & Units 07/18/2022    4:40 PM 07/02/2022   11:14 AM 04/10/2022    9:33 AM 02/06/2022   10:17 AM 12/12/2021    9:11 AM  Diabetic Labs  HbA1c 4.0 - 5.6 %  8.5  8.9     Microalbumin 0.0 - 1.9 mg/dL    7.6    Micro/Creat Ratio 0.0 - 30.0 mg/g    8.1    Chol 100 - 199 mg/dL     169   HDL >39 mg/dL     74   Calc LDL 0 - 99 mg/dL     82   Triglycerides 0 - 149 mg/dL  70   Creatinine 0.44 - 1.00 mg/dL 1.10   1.12   1.03       08/09/2022    8:35 AM 07/31/2022    3:36 PM 07/10/2022    7:56 AM 07/02/2022   10:50 AM 04/10/2022    9:00 AM 02/06/2022    9:41 AM 12/11/2021    2:43 PM  BP/Weight  Systolic BP 203  559 741 638 453 646  Diastolic BP 78  62 68 72 72 66  Wt. (Lbs) 123 126 126 125.8 127.12 125.2 126  BMI 19.85 kg/m2 19.44 kg/m2 19.73 kg/m2 19.13 kg/m2 19.33 kg/m2 19.04 kg/m2 19.16 kg/m2      Latest Ref Rng & Units 03/19/2022   12:00 AM 12/11/2021    2:20 PM  Foot/eye exam completion dates  Eye Exam No Retinopathy Retinopathy       Foot Form Completion   Done     This result is from an external  source.        GERD (gastroesophageal reflux disease) Controlled, no change in medication

## 2022-08-16 ENCOUNTER — Ambulatory Visit (HOSPITAL_COMMUNITY)
Admission: RE | Admit: 2022-08-16 | Discharge: 2022-08-16 | Disposition: A | Discharge status: Home / Self Care | Payer: PPO | Source: Ambulatory Visit | Attending: Family Medicine | Admitting: Family Medicine

## 2022-08-16 ENCOUNTER — Other Ambulatory Visit: Payer: Self-pay | Admitting: Endocrinology

## 2022-08-16 DIAGNOSIS — Z1231 Encounter for screening mammogram for malignant neoplasm of breast: Secondary | ICD-10-CM | POA: Diagnosis not present

## 2022-08-16 DIAGNOSIS — E1140 Type 2 diabetes mellitus with diabetic neuropathy, unspecified: Secondary | ICD-10-CM

## 2022-08-19 ENCOUNTER — Other Ambulatory Visit: Payer: Self-pay | Admitting: Endocrinology

## 2022-08-19 DIAGNOSIS — Z794 Long term (current) use of insulin: Secondary | ICD-10-CM

## 2022-08-26 ENCOUNTER — Ambulatory Visit: Payer: PPO | Admitting: Podiatry

## 2022-08-26 ENCOUNTER — Encounter: Payer: Self-pay | Admitting: Podiatry

## 2022-08-26 DIAGNOSIS — M79675 Pain in left toe(s): Secondary | ICD-10-CM

## 2022-08-26 DIAGNOSIS — M79674 Pain in right toe(s): Secondary | ICD-10-CM

## 2022-08-26 DIAGNOSIS — M722 Plantar fascial fibromatosis: Secondary | ICD-10-CM

## 2022-08-26 DIAGNOSIS — M205X9 Other deformities of toe(s) (acquired), unspecified foot: Secondary | ICD-10-CM

## 2022-08-26 DIAGNOSIS — E1142 Type 2 diabetes mellitus with diabetic polyneuropathy: Secondary | ICD-10-CM

## 2022-08-26 DIAGNOSIS — B351 Tinea unguium: Secondary | ICD-10-CM

## 2022-08-26 NOTE — Progress Notes (Signed)
This patient returns to my office for at risk foot care.  This patient requires this care by a professional since this patient will be at risk due to having DM with neuropathy.  This patient is unable to cut nails herself since the patient cannot reach her nails.These nails are painful walking and wearing shoes.  Patient has cramping through both feet at night for the last 4 weeks.This patient presents for at risk foot care today.  General Appearance  Alert, conversant and in no acute stress.  Vascular  Dorsalis pedis and posterior tibial  pulses are palpable  bilaterally.  Capillary return is within normal limits  bilaterally. Temperature is within normal limits  bilaterally.  Neurologic  Senn-Weinstein monofilament wire test absent   bilaterally. Muscle power within normal limits bilaterally.  Nails Thick disfigured discolored nails with subungual debris  from hallux to fifth toes bilaterally. No evidence of bacterial infection or drainage bilaterally.  Orthopedic  No limitations of motion  feet .  No crepitus or effusions noted.  No bony pathology or digital deformities noted. Hallux limitus 1st MPJ  B/L.  Skin  normotropic skin with no porokeratosis noted bilaterally.  No signs of infections or ulcers noted.     Onychomycosis  Pain in right toes  Pain in left toes  Plantar faciitis  secondary to  Hallux limitus  B/L.  Consent was obtained for treatment procedures.   Mechanical debridement of nails 1-5  bilaterally performed with a nail nipper.  Filed with dremel without incident. Discussed her plantar fasciitis with this patient.  Told her to get xrays and work-up for her cramping.   Return office visit   4 months                   Told patient to return for periodic foot care and evaluation due to potential at risk complications.   Gardiner Barefoot DPM

## 2022-09-26 ENCOUNTER — Ambulatory Visit: Payer: PPO | Admitting: Podiatry

## 2022-09-30 ENCOUNTER — Ambulatory Visit: Payer: PPO | Admitting: Podiatry

## 2022-09-30 ENCOUNTER — Ambulatory Visit (INDEPENDENT_AMBULATORY_CARE_PROVIDER_SITE_OTHER): Payer: PPO

## 2022-09-30 ENCOUNTER — Encounter: Payer: Self-pay | Admitting: Podiatry

## 2022-09-30 DIAGNOSIS — M722 Plantar fascial fibromatosis: Secondary | ICD-10-CM

## 2022-09-30 MED ORDER — TRIAMCINOLONE ACETONIDE 40 MG/ML IJ SUSP
20.0000 mg | Freq: Once | INTRAMUSCULAR | Status: AC
  Administered 2022-09-30: 20 mg

## 2022-09-30 NOTE — Progress Notes (Signed)
She presents today to see me after having not seen her for quite some time.  She states that the toes on the right foot are aching and cramping she states that sort of radiating up the inside of the leg and the back part of my leg.  She states nighttimes are worse with the cramps sometimes they hurt so bad she cannot even straighten her foot out when she stands on it.  She does have a history of diabetes and her last hemoglobin A1c was at 8.5.  Objective: Vital signs are stable she is alert oriented x3.  Pulses are palpable.  She has moderate to severe pain on palpation medial calcaneal tubercle of the right heel and central calcaneal tubercle as well.  Radiographs taken today demonstrate an osseously mature individual soft tissue increase in density plantar fascial Caney insertion site no fractures are identified.  Osteopenia is noted.  Assessment: Planter fasciitis most likely resulting in some lateral compensatory syndrome overuse syndrome of the small intrinsic musculature i.e. causing cramps.  Plan: I injected the area today with 20 mg Kenalog 5 mg Marcaine point of maximal tenderness.  We discussed appropriate shoe gear stretching exercises ice therapy and shoe gear modifications.  Should her symptoms fail to improve she will notify us immediately.

## 2022-10-14 DIAGNOSIS — F411 Generalized anxiety disorder: Secondary | ICD-10-CM | POA: Diagnosis not present

## 2022-10-15 ENCOUNTER — Other Ambulatory Visit (HOSPITAL_COMMUNITY): Payer: Self-pay

## 2022-10-16 ENCOUNTER — Other Ambulatory Visit: Payer: Self-pay | Admitting: Endocrinology

## 2022-10-16 DIAGNOSIS — E1065 Type 1 diabetes mellitus with hyperglycemia: Secondary | ICD-10-CM

## 2022-10-17 ENCOUNTER — Ambulatory Visit: Payer: PPO | Admitting: Endocrinology

## 2022-11-16 ENCOUNTER — Other Ambulatory Visit: Payer: Self-pay | Admitting: Family Medicine

## 2022-11-20 DIAGNOSIS — H35371 Puckering of macula, right eye: Secondary | ICD-10-CM | POA: Diagnosis not present

## 2022-11-20 DIAGNOSIS — H43821 Vitreomacular adhesion, right eye: Secondary | ICD-10-CM | POA: Diagnosis not present

## 2022-11-20 DIAGNOSIS — E103593 Type 1 diabetes mellitus with proliferative diabetic retinopathy without macular edema, bilateral: Secondary | ICD-10-CM | POA: Diagnosis not present

## 2022-11-20 DIAGNOSIS — H4321 Crystalline deposits in vitreous body, right eye: Secondary | ICD-10-CM | POA: Diagnosis not present

## 2022-11-20 DIAGNOSIS — H31093 Other chorioretinal scars, bilateral: Secondary | ICD-10-CM | POA: Diagnosis not present

## 2022-11-20 DIAGNOSIS — H43391 Other vitreous opacities, right eye: Secondary | ICD-10-CM | POA: Diagnosis not present

## 2022-11-20 LAB — HM DIABETES EYE EXAM

## 2022-11-21 ENCOUNTER — Encounter: Payer: Self-pay | Admitting: Family Medicine

## 2022-11-21 ENCOUNTER — Ambulatory Visit (INDEPENDENT_AMBULATORY_CARE_PROVIDER_SITE_OTHER): Payer: PPO | Admitting: Family Medicine

## 2022-11-21 VITALS — BP 108/67 | HR 74 | Ht 66.0 in | Wt 119.1 lb

## 2022-11-21 DIAGNOSIS — Z794 Long term (current) use of insulin: Secondary | ICD-10-CM

## 2022-11-21 DIAGNOSIS — B351 Tinea unguium: Secondary | ICD-10-CM | POA: Diagnosis not present

## 2022-11-21 DIAGNOSIS — R252 Cramp and spasm: Secondary | ICD-10-CM | POA: Diagnosis not present

## 2022-11-21 DIAGNOSIS — E785 Hyperlipidemia, unspecified: Secondary | ICD-10-CM

## 2022-11-21 DIAGNOSIS — E1140 Type 2 diabetes mellitus with diabetic neuropathy, unspecified: Secondary | ICD-10-CM

## 2022-11-21 DIAGNOSIS — M25462 Effusion, left knee: Secondary | ICD-10-CM | POA: Diagnosis not present

## 2022-11-21 DIAGNOSIS — J302 Other seasonal allergic rhinitis: Secondary | ICD-10-CM | POA: Diagnosis not present

## 2022-11-21 DIAGNOSIS — F322 Major depressive disorder, single episode, severe without psychotic features: Secondary | ICD-10-CM

## 2022-11-21 NOTE — Assessment & Plan Note (Signed)
Check magnesium and potassium levels

## 2022-11-21 NOTE — Progress Notes (Signed)
Ashley Armstrong     MRN: [id number]      DOB: [birth year]   HPI Ashley Armstrong is here for follow up and re-evaluation of chronic medical conditions, medication management and review of any available recent lab and radiology data.  Preventive health is updated, specifically  Cancer screening and Immunization.   Questions or concerns regarding consultations or procedures which the PT has had in the interim are  addressed. The PT denies any adverse reactions to current medications since the last visit.  3 day h/o swelling behind right knee , spontaneous, non tender, h/o Baker's cyst, c/o cramping pains in legs and calves, not related to activity Blood sugar still varying but not as high as in the past Denies polyuria, polydipsia, blurred vision , or hypoglycemic episodes. Concerned about weight , has started low fat diet and is eating less calories ROS Denies recent fever or chills. Denies sinus pressure, nasal congestion, ear pain or sore throat. Denies chest congestion, productive cough or wheezing. Denies chest pains, palpitations and leg swelling Denies abdominal pain, nausea, vomiting,diarrhea or constipation.   Denies dysuria, frequency, hesitancy or incontinence. . Denies headaches, seizures, numbness, or tingling. C/o  depression, anxiety less severe since she has grown in her [employer] faith Denies skin break down or rash.   PE  BP 108/67 (BP Location: Right Arm, Patient Position: Sitting, Cuff Size: Normal)   Pulse 74   Ht '5\' 6"'$  (1.676 m)   Wt 119 lb 1.9 oz (54 kg)   LMP 07/19/2013   SpO2 96%   BMI 19.23 kg/m   Patient alert and oriented and in no cardiopulmonary distress.  HEENT: No facial asymmetry, EOMI,     Neck supple .  Chest: Clear to auscultation bilaterally.  CVS: S1, S2 no murmurs, no S3.Regular rate.  ABD: Soft non tender.   Ext: No edema  MS: Adequate ROM spine, shoulders, hips and knees.No calf swelling, warmth or tenderness, Homans nefative. Swelling  posterior left knee, no redness or warmth  Skin: Intact, no ulcerations or rash noted.  Psych: Good eye contact, normal affect. Memory intact not anxious or depressed appearing.  CNS: CN 2-12 intact, power,  normal throughout.no focal deficits noted.   Assessment & Plan  Type 2 diabetes mellitus with diabetic neuropathy, unspecified ([place]) Ashley Armstrong is reminded of the importance of commitment to daily physical activity for 30 minutes or more, as able and the need to limit carbohydrate intake to 30 to 60 grams per meal to help with blood sugar control.   The need to take medication as prescribed, test blood sugar as directed, and to call between visits if there is a concern that blood sugar is uncontrolled is also discussed.   Ashley Armstrong is reminded of the importance of daily foot exam, annual eye examination, and good blood sugar, blood pressure and cholesterol control. Updated lab needed at/ before next visit. Manged by Endo and has upcoming appt     Latest Ref Rng & Units 07/18/2022    4:40 PM 07/02/2022   11:14 AM 04/10/2022    9:33 AM 02/06/2022   10:17 AM 12/12/2021    9:11 AM  Diabetic Labs  HbA1c 4.0 - 5.6 %  8.5  8.9     Microalbumin 0.0 - 1.9 mg/dL    7.6    Micro/Creat Ratio 0.0 - 30.0 mg/g    8.1    Chol 100 - 199 mg/dL     169   HDL >39 mg/dL  74   Calc LDL 0 - 99 mg/dL     82   Triglycerides 0 - 149 mg/dL     70   Creatinine 0.44 - 1.00 mg/dL 1.10   1.12   1.03       11/21/2022    2:36 PM 08/09/2022    8:35 AM 07/31/2022    3:36 PM 07/10/2022    7:56 AM 07/02/2022   10:50 AM 04/10/2022    9:00 AM 02/06/2022    9:41 AM  BP/Weight  Systolic BP 830 940  768 088 110 315  Diastolic BP 67 78  62 68 72 72  Wt. (Lbs) 119.12 123 126 126 125.8 127.12 125.2  BMI 19.23 kg/m2 19.85 kg/m2 19.44 kg/m2 19.73 kg/m2 19.13 kg/m2 19.33 kg/m2 19.04 kg/m2      Latest Ref Rng & Units 11/21/2022    2:20 PM 03/19/2022   12:00 AM  Foot/eye exam completion dates  Eye Exam No Retinopathy   Retinopathy      Foot Form Completion  Done      This result is from an external source.        Hyperlipidemia LDL goal <100 Hyperlipidemia:Low fat diet discussed and encouraged.   Lipid Panel  Lab Results  Component Value Date   CHOL 169 12/12/2021   HDL 74 12/12/2021   LDLCALC 82 12/12/2021   TRIG 70 12/12/2021   CHOLHDL 2.3 12/12/2021     Updated lab needed at/ before next visit.   Depression, major, single episode, severe ([place]) Inadequately treated , managed by Psych, not suicidal or homicidal  Muscle cramps Check magnesium and potassium levels  Seasonal allergies Controlled, no change in medication   Onychomycosis Will rx terbinafine if liver function is normal  Swelling of knee joint, left Acute swelling posterior left knee ,possible Baker's cyst, Dr Court Joy to eval and manmage

## 2022-11-21 NOTE — Assessment & Plan Note (Signed)
[name] is reminded of the importance of commitment to daily physical activity for 30 minutes or more, as able and the need to limit carbohydrate intake to 30 to 60 grams per meal to help with blood sugar control.   The need to take medication as prescribed, test blood sugar as directed, and to call between visits if there is a concern that blood sugar is uncontrolled is also discussed.   [name] is reminded of the importance of daily foot exam, annual eye examination, and good blood sugar, blood pressure and cholesterol control. Updated lab needed at/ before next visit. Manged by Endo and has upcoming appt     Latest Ref Rng & Units 07/18/2022    4:40 PM 07/02/2022   11:14 AM 04/10/2022    9:33 AM 02/06/2022   10:17 AM 12/12/2021    9:11 AM  Diabetic Labs  HbA1c 4.0 - 5.6 %  8.5  8.9     Microalbumin 0.0 - 1.9 mg/dL    7.6    Micro/Creat Ratio 0.0 - 30.0 mg/g    8.1    Chol 100 - 199 mg/dL     169   HDL >39 mg/dL     74   Calc LDL 0 - 99 mg/dL     82   Triglycerides 0 - 149 mg/dL     70   Creatinine 0.44 - 1.00 mg/dL 1.10   1.12   1.03       11/21/2022    2:36 PM 08/09/2022    8:35 AM 07/31/2022    3:36 PM 07/10/2022    7:56 AM 07/02/2022   10:50 AM 04/10/2022    9:00 AM 02/06/2022    9:41 AM  BP/Weight  Systolic BP 381 771  165 790 383 338  Diastolic BP 67 78  62 68 72 72  Wt. (Lbs) 119.12 123 126 126 125.8 127.12 125.2  BMI 19.23 kg/m2 19.85 kg/m2 19.44 kg/m2 19.73 kg/m2 19.13 kg/m2 19.33 kg/m2 19.04 kg/m2      Latest Ref Rng & Units 11/21/2022    2:20 PM 03/19/2022   12:00 AM  Foot/eye exam completion dates  Eye Exam No Retinopathy  Retinopathy      Foot Form Completion  Done      This result is from an external source.

## 2022-11-21 NOTE — Patient Instructions (Addendum)
Reschedule January appt to annual exam due Feb 1 or after, call if you need me sooner ' Labs today already ordered in 07/2022, add magnesium, tSH an vit D level to lab order please  Appointment to be scheduled with Dr Court Joy evaluate swelling behind right knee possible Baker's cyst  I will prescribe medication for fungal nail and foot inmfection once liver is normal  Nurse please measure pt height before she leaves, she is concerned about weight and thinks height is 2 in more than documented  Thanks for choosing [facility], we consider it a privelige to serve you.

## 2022-11-21 NOTE — Assessment & Plan Note (Signed)
Hyperlipidemia:Low fat diet discussed and encouraged.   Lipid Panel  Lab Results  Component Value Date   CHOL 169 12/12/2021   HDL 74 12/12/2021   LDLCALC 82 12/12/2021   TRIG 70 12/12/2021   CHOLHDL 2.3 12/12/2021     Updated lab needed at/ before next visit.

## 2022-11-21 NOTE — Assessment & Plan Note (Signed)
Acute swelling posterior left knee ,possible Baker's cyst, Dr Court Joy to eval and manmage

## 2022-11-21 NOTE — Assessment & Plan Note (Signed)
Will rx terbinafine if liver function is normal

## 2022-11-21 NOTE — Assessment & Plan Note (Signed)
Controlled, no change in medication  

## 2022-11-21 NOTE — Assessment & Plan Note (Signed)
Inadequately treated , managed by Psych, not suicidal or homicidal

## 2022-11-22 LAB — CMP14+EGFR
ALT: 16 IU/L (ref 0–32)
AST: 20 IU/L (ref 0–40)
Albumin/Globulin Ratio: 2 (ref 1.2–2.2)
Albumin: 4.3 g/dL (ref 3.8–4.9)
Alkaline Phosphatase: 94 IU/L (ref 44–121)
BUN/Creatinine Ratio: 18 (ref 9–23)
BUN: 20 mg/dL (ref 6–24)
Bilirubin Total: 0.4 mg/dL (ref 0.0–1.2)
CO2: 23 mmol/L (ref 20–29)
Calcium: 9.6 mg/dL (ref 8.7–10.2)
Chloride: 105 mmol/L (ref 96–106)
Creatinine, Ser: 1.11 mg/dL — ABNORMAL HIGH (ref 0.57–1.00)
Globulin, Total: 2.2 g/dL (ref 1.5–4.5)
Glucose: 210 mg/dL — ABNORMAL HIGH (ref 70–99)
Potassium: 4.6 mmol/L (ref 3.5–5.2)
Sodium: 140 mmol/L (ref 134–144)
Total Protein: 6.5 g/dL (ref 6.0–8.5)
eGFR: 59 mL/min/{1.73_m2} — ABNORMAL LOW (ref >59)

## 2022-11-22 LAB — VITAMIN D 25 HYDROXY (VIT D DEFICIENCY, FRACTURES): Vit D, 25-Hydroxy: 68.6 ng/mL (ref 30.0–100.0)

## 2022-11-22 LAB — TSH: TSH: 0.992 u[IU]/mL (ref 0.450–4.500)

## 2022-11-22 LAB — LIPID PANEL
Chol/HDL Ratio: 2 ratio (ref 0.0–4.4)
Cholesterol, Total: 160 mg/dL (ref 100–199)
HDL: 79 mg/dL (ref >39)
LDL Chol Calc (NIH): 70 mg/dL (ref 0–99)
Triglycerides: 51 mg/dL (ref 0–149)
VLDL Cholesterol Cal: 11 mg/dL (ref 5–40)

## 2022-11-22 LAB — HEMOGLOBIN A1C
Est. average glucose Bld gHb Est-mCnc: 226 mg/dL
Hgb A1c MFr Bld: 9.5 % — ABNORMAL HIGH (ref 4.8–5.6)

## 2022-11-22 LAB — MAGNESIUM: Magnesium: 2.3 mg/dL (ref 1.6–2.3)

## 2022-11-26 ENCOUNTER — Other Ambulatory Visit: Payer: Self-pay | Admitting: Endocrinology

## 2022-12-03 ENCOUNTER — Ambulatory Visit (INDEPENDENT_AMBULATORY_CARE_PROVIDER_SITE_OTHER): Payer: PPO | Admitting: Internal Medicine

## 2022-12-03 ENCOUNTER — Encounter: Payer: Self-pay | Admitting: Internal Medicine

## 2022-12-03 VITALS — BP 117/77 | HR 84 | Resp 16 | Ht 67.0 in | Wt 120.0 lb

## 2022-12-03 DIAGNOSIS — M25462 Effusion, left knee: Secondary | ICD-10-CM | POA: Diagnosis not present

## 2022-12-03 DIAGNOSIS — M25562 Pain in left knee: Secondary | ICD-10-CM | POA: Diagnosis not present

## 2022-12-03 NOTE — Patient Instructions (Addendum)
Thank you for trusting me with your care. To recap, today we discussed the following:   I did not see a obvious cause of swelling of your knee on ultrasound. I will send you for xray as the first step.   If you continue to have cramps in your legs schedule a follow up visit and we will do more blood work.

## 2022-12-03 NOTE — Progress Notes (Unsigned)
   HPI:Ms.LEO WEYANDT is a 57 y.o. female who presents for evaluation of left knee pain and swelling. For the details of today's visit, please refer to the assessment and plan.  Physical Exam: Vitals:   12/03/22 1428  BP: 117/77  Pulse: 84  Resp: 16  SpO2: 97%  Weight: 120 lb (54.4 kg)  Height: '5\' 7"'$  (1.702 m)   Knee: - Inspection: Mild effusion behind patient left knee - Palpation: TTP over anterior medial joint line. No pain to palpation in popliteal fossa.  - ROM: full active ROM with flexion and extension in knee - Strength: 5/5 strength - Neuro/vasc: NV intact  No fluid pocket visualized in back of knee with POCUS. No fluid in suprapatellar pouch.    Assessment & Plan:   Swelling of knee joint, left Patient has anterior medial joint line tenderness and swelling behind left knee. Evaluated patient with POCUS and no apparent bakers cyst or fluid in suprapatellar pouch. Agree the most likely cause is a bakers cyst. It is possible patient could have ruptured cyst, but this evaluation with ultrasound is beyond my scope at this time. I was able to compress popliteal vein and no clot seen in this area. Not red , hot ,swollen, and no fever - Will send for xray of left knee - Recommend conservative therapy at this time and follow up pain and swelling increases.    Lorene Dy, MD

## 2022-12-05 ENCOUNTER — Other Ambulatory Visit: Payer: Self-pay | Admitting: Family Medicine

## 2022-12-05 DIAGNOSIS — M25462 Effusion, left knee: Secondary | ICD-10-CM | POA: Insufficient documentation

## 2022-12-05 NOTE — Assessment & Plan Note (Addendum)
Patient has anterior medial joint line tenderness and swelling behind left knee. Evaluated patient with POCUS and no apparent bakers cyst or fluid in suprapatellar pouch. Agree the most likely cause is a bakers cyst. It is possible patient could have ruptured cyst, but this evaluation with ultrasound is beyond my scope at this time. I was able to compress popliteal vein and no clot seen in this area. Not red , hot ,swollen, and no fever - Will send for xray of left knee - Recommend conservative therapy at this time and follow up pain and swelling increases.

## 2022-12-06 ENCOUNTER — Ambulatory Visit: Payer: PPO | Admitting: Family Medicine

## 2022-12-10 ENCOUNTER — Other Ambulatory Visit: Payer: Self-pay

## 2022-12-10 ENCOUNTER — Ambulatory Visit: Payer: PPO | Admitting: Endocrinology

## 2022-12-10 ENCOUNTER — Encounter: Payer: Self-pay | Admitting: Endocrinology

## 2022-12-10 VITALS — BP 112/68 | HR 80 | Ht 67.0 in | Wt 121.0 lb

## 2022-12-10 DIAGNOSIS — E782 Mixed hyperlipidemia: Secondary | ICD-10-CM | POA: Diagnosis not present

## 2022-12-10 DIAGNOSIS — E1065 Type 1 diabetes mellitus with hyperglycemia: Secondary | ICD-10-CM

## 2022-12-10 MED ORDER — FREESTYLE LIBRE 3 READER DEVI: 1.0000 | 0 refills | Status: AC

## 2022-12-10 MED ORDER — FREESTYLE LIBRE 3 SENSOR MISC: 2 refills | Status: DC

## 2022-12-10 MED ORDER — FREESTYLE LIBRE 2 SENSOR MISC: 2 refills | Status: DC

## 2022-12-10 NOTE — Progress Notes (Unsigned)
Patient ID: Ashley Armstrong, female   DOB: [birth year], 57 y.o.   MRN: [id number]           Reason for Appointment: Follow-up for Type 1 Diabetes  Referring physician: Dr. [name]   History of Present Illness:          Date of diagnosis of type 1 diabetes mellitus: Age 85       Background history:  She has been on insulin practically since her diagnosis was made.  Initially she had symptoms of weight loss and blurred vision For several years has been on Lantus and NovoLog but usually has had poor control with A1c mostly between 8-10%  Recent history:     INSULIN regimen is: TOUJEO 70 units in a.m.  NovoLog at mealtimes mostly 8-10 units    Current management, blood sugar patterns and problems identified:  Her A1c is again higher than target at 9.5  She has not been since 8/22 She says she was not able to get Levemir because of supply issues and did not let us know She is also going up to 10 units on her basal insulin in the morning without improvement in her control Was told to switch once a day Toujeo to Levemir twice a day on the last visit after she finished but she did not call  She thinks that most of her high sugars during the day are from poor diet and eating sweets and unbalanced meals Despite repeated reminders to increase her mealtime insulin coverage she is actually taking less than before She also did not take her NovoLog on time when blood sugars are near normal before eating She again refuses to consider the insulin pump          Meal times are:  Breakfast is 7 AM at Lunch:  PM dinner:3 PM  Typical meal intake: Breakfast is sometimes high fat but otherwise eggs and toast.  Lunch usually a mixed meal and evening meal may be a sandwich with bland food.  Snacks will be cheese and crackers Usually avoiding drinks with sugar            Exercise: None     Dates of Recording: Last 2 weeks  Sensor description: Libre 2.  Interpretation is as  follows  Overnight blood sugars are mostly lower between 1 AM-5 AM with occasional hypoglycemia around 3 AM but otherwise may sometimes show hyperglycemia also Blood sugars are above the target range on an average consistently the rest of the day HIGHEST blood sugars are mid afternoon averaging 252 between 2-4 PM HYPERGLYCEMIA is present periodically at all times and frequently has rise in the blood sugar after meals are rebounding from low normal sugars Pre-meal blood sugars are in the 200+ range at all times although FASTING blood sugars are averaging closer to 180 Significant variability present with GV 34 HYPOGLYCEMIA is seen only as above overnight occasionally  Results statistics:  CGM use % of time 97  2-week average/GV 210  Time in range      35  %  % Time Above 180 35  % Time above 250 29  % Time Below 70 1     Previously:  CGM use % of time 97  2-week average/GV 207/33  Time in range      34%, was 26  % Time Above 180 38  % Time above 250 26  % Time Below 70 2     Dietician visit, most recent: 11/18  Weight history:  Wt Readings from Last 3 Encounters:  12/10/22 121 lb (54.9 kg)  12/03/22 120 lb (54.4 kg)  11/21/22 119 lb 1.9 oz (54 kg)    Glycemic control:   Lab Results  Component Value Date   HGBA1C 9.5 (H) 11/21/2022   HGBA1C 8.5 (A) 07/02/2022   HGBA1C 8.9 (H) 04/10/2022   Lab Results  Component Value Date   MICROALBUR 7.6 (H) 02/06/2022   LDLCALC 70 11/21/2022   CREATININE 1.11 (H) 11/21/2022   Lab Results  Component Value Date   MICRALBCREAT 8.1 02/06/2022    No results found for: "FRUCTOSAMINE"  No visits with results within 1 Week(s) from this visit.  Latest known visit with results is:  Abstract on 11/22/2022  Component Date Value Ref Range Status   HM Diabetic Eye Exam 11/20/2022 Retinopathy (A)  No Retinopathy Final    Allergies as of 12/10/2022   No Known Allergies      Medication List        Accurate as of December 10, 2022 11:59 PM. If you have any questions, ask your nurse or doctor.          STOP taking these medications    Levemir FlexTouch 100 UNIT/ML FlexPen Generic drug: insulin detemir Stopped by: Elayne Snare, MD       TAKE these medications    chlorpheniramine 4 MG tablet Commonly known as: CHLOR-TRIMETON Take one tablet by mouth once daily for 3 days, then as needed, for uncontrolled allergies and ear pressure   diazepam 10 MG tablet Commonly known as: VALIUM as needed.   Durezol 0.05 % Emul Generic drug: Difluprednate Apply 1 drop to eye daily at 8 pm.   Fish Oil 1000 MG Caps Take 1 capsule by mouth daily.   fluticasone 50 MCG/ACT nasal spray Commonly known as: FLONASE Place 2 sprays into both nostrils daily.   FreeStyle Libre 3 Reader Devi 1 Device by Does not apply route continuous. What changed: See the new instructions. Changed by: Jefferson Fuel, CMA   FreeStyle Libre 3 Sensor Misc Place 1 sensor on the skin every 14 days. Use to check glucose continuously What changed: See the new instructions. Changed by: Jefferson Fuel, CMA   FreeStyle Precision Neo System w/Device Kit 1 kit by Does not apply route 2 (two) times daily.   lisinopril 2.5 MG tablet Commonly known as: ZESTRIL TAKE ONE (1) TABLET BY MOUTH EVERY DAY   metoCLOPramide 5 MG tablet Commonly known as: REGLAN TAKE 1 TO 2 TABLETS BY MOUTH EVERY 6 HOURS AS NEEDED FOR NAUSEA OR VOMITING.   montelukast 10 MG tablet Commonly known as: SINGULAIR TAKE ONE TABLET ('10MG'$  TOTAL) BY MOUTH ATBEDTIME   NovoLOG FlexPen 100 UNIT/ML FlexPen Generic drug: insulin aspart INJECT 8 TO 12 UNITS SUBCUTANEOUSLY BEFORE EACH MEAL AS DIRECTED. ADJUST PERSLIDING SCALE TO A MAXIMUM DOSE PER DAY OF 51 UNITS.   ondansetron 4 MG tablet Commonly known as: ZOFRAN TAKE ONE TABLET BY MOUTH EVERY EIGHT HOURS AS NEEDED FOR NAUSEA OR VOMITING   OneTouch Delica Lancets 80H Misc USE FOUR TIMES A DAY AS DIRECTED.    OneTouch Verio test strip Generic drug: glucose blood USE AS BACKUP FOR SENSOR, ONCE A DAY AS NEEDED   pantoprazole 40 MG tablet Commonly known as: PROTONIX Take 1 tablet (40 mg total) by mouth 2 (two) times daily before a meal. Once symptoms controlled, cut back to once daily if tolerated.   polyethylene glycol 17 g packet Commonly  known as: MIRALAX / GLYCOLAX Take 17 g by mouth daily as needed.   rosuvastatin 40 MG tablet Commonly known as: CRESTOR TAKE ONE (1) TABLET BY MOUTH EVERY DAY   sodium chloride 0.65 % nasal spray Commonly known as: OCEAN Place 1 spray into the nose as needed for congestion.   Sure Comfort Pen Needles 30G X 8 MM Misc Generic drug: Insulin Pen Needle USE FIVE TIMES A DAY   Toujeo Max SoloStar 300 UNIT/ML Solostar Pen Generic drug: insulin glargine (2 Unit Dial) Inject 70 Units into the skin daily.   vitamin B-12 100 MCG tablet Commonly known as: CYANOCOBALAMIN Take 1 tablet by mouth daily.   VITAMIN D PO Take 1 Dose by mouth daily.        Allergies: No Known Allergies  Past Medical History:  Diagnosis Date   Anxiety    Dyslipidemia    Gastroparesis due to DM ([place]) OCT 2014   75% AT 2 HRS, GLU >    H/O eye surgery 01/2018   Headache(784.0)    IDDM (insulin dependent diabetes mellitus)    Neuropathy    Nicotine addiction    TIA (transient ischemic attack)    Dr. Merlene Laughter    Past Surgical History:  Procedure Laterality Date   Cataract surgery Bilateral 01/20/2017, 02/03/2017   COLONOSCOPY N/A 05/09/2016   Dr. Oneida Alar: 2 hyperplastic polyps removed.  Next colonoscopy in 2027   ESOPHAGOGASTRODUODENOSCOPY N/A 08/31/2013   SLF: 1. Earky satiety nausea may be due to Gastroparesis/pyloric channel stenosis. 2. small hiatal hernia 3. Moderate erosive gastritis.    EYE SURGERY  01/2018   laser surgery bilateral eye      Family History  Problem Relation Age of Onset   Hypertension Mother    Diabetes Mother    Hyperlipidemia Mother     Rosacea Mother    Hypertension Father    Diabetes Brother    Breast cancer Maternal Aunt    Colon cancer Neg Hx     Social History:  reports that she quit smoking about 11 years ago. Her smoking use included cigarettes. She has a 36.00 pack-year smoking history. She has never used smokeless tobacco. She reports that she does not drink alcohol and does not use drugs.   Review of Systems   Lipid history: Followed by PCP, currently on Crestor 40 mg Labs as follows She has aortic atherosclerosis on  CT scan   Lab Results  Component Value Date   CHOL 160 11/21/2022   HDL 79 11/21/2022   LDLCALC 70 11/21/2022   TRIG 51 11/21/2022   CHOLHDL 2.0 11/21/2022           Blood pressure history:  BP Readings from Last 3 Encounters:  12/10/22 112/68  12/03/22 117/77  11/21/22 108/67    Most recent eye exam was in 1/24  Most recent foot exam: 2/23  GASTROPARESIS: Has symptoms of fullness and nausea, will take Zofran as needed  Followed by gastroenterologist  She is on Reglan before meals, taking 5 mg only as needed No nausea now   Currently known complications of diabetes: Gastroparesis, neuropathy, retinopathy, no nephropathy  LABS:  No visits with results within 1 Week(s) from this visit.  Latest known visit with results is:  Abstract on 11/22/2022  Component Date Value Ref Range Status   HM Diabetic Eye Exam 11/20/2022 Retinopathy (A)  No Retinopathy Final    Physical Examination:  BP 112/68 (BP Location: Left Arm, Patient Position: Sitting, Cuff Size: Small)  Pulse 80   Ht '5\' 7"'$  (1.702 m)   Wt 121 lb (54.9 kg)   LMP 07/19/2013   SpO2 96%   BMI 18.95 kg/m        ASSESSMENT:  Diabetes type 1, poorly controlled with multiple complications  See history of present illness for detailed discussion of current diabetes management, blood sugar patterns from her freestyle libre CGM download and problems identified  Her A1c is consistently over 8% and now 9.5  She  is on basal bolus insulin with Toujeo once a day and mealtime NovoLog  She has brought hyperglycemia during the day with inadequate coverage of her meals but also has before appears to be needing higher doses of basal insulin during the day Timing of her NovoLog may also be improved She has been watching her diet poorly and getting excessive carbohydrates  LIPIDS well-controlled   PLAN:    She will take 16 to 18 units of NovoLog with meal coverage for all meals especially in the morning needs higher doses Also timing of insulin was discussed She will need to try and moderate her diet and avoid high carbohydrate meals and snacks and get more balanced meals and fiber  Advised her to follow instructions given on her visit summary Also may be given a trial of LANTUS on the next prescription  Empirically will start her on Lantus 50 units in the morning and 15 at bedtime in the evening  Will try to get her the Dexcom sensor if available Recommend follow-up with the diabetes educator  More regular follow-up, she can come back in 3 months   Patient Instructions  Lantus 50 in am and 15 at bedtime  Novolog 16-18  at meals  Total visit time including counseling = 30 minutes  Elayne Snare 12/11/2022, 10:38 AM   Note: This office note was prepared with Dragon voice recognition system technology. Any transcriptional errors that result from this process are unintentional.

## 2022-12-10 NOTE — Patient Instructions (Signed)
Lantus 50 in am and 15 at bedtime  Novolog 16-18  at meals

## 2022-12-11 MED ORDER — LANTUS SOLOSTAR 100 UNIT/ML ~~LOC~~ SOPN: PEN_INJECTOR | SUBCUTANEOUS | 1 refills | Status: DC

## 2022-12-12 ENCOUNTER — Encounter: Payer: Self-pay | Admitting: Endocrinology

## 2022-12-17 ENCOUNTER — Telehealth: Payer: Self-pay | Admitting: Endocrinology

## 2022-12-17 MED ORDER — NOVOLOG FLEXPEN 100 UNIT/ML ~~LOC~~ SOPN: PEN_INJECTOR | SUBCUTANEOUS | 3 refills | Status: DC

## 2022-12-17 MED ORDER — LANTUS SOLOSTAR 100 UNIT/ML ~~LOC~~ SOPN: PEN_INJECTOR | SUBCUTANEOUS | 1 refills | Status: DC

## 2022-12-17 NOTE — Telephone Encounter (Signed)
Script direction changed and sent to pharmacy

## 2022-12-17 NOTE — Telephone Encounter (Signed)
Patient is calling saying that the pharmacy has told her that in order for her to get the 90 day supply for:   1.) Lantus the directions need to be changed to 50 units in the morning and 15 units at night.  2.) Novolog the directions need to say 16-18 units at meals.

## 2022-12-18 ENCOUNTER — Encounter: Payer: PPO | Admitting: Family Medicine

## 2022-12-25 ENCOUNTER — Ambulatory Visit: Payer: PPO | Admitting: Podiatry

## 2022-12-31 ENCOUNTER — Encounter: Payer: Self-pay | Admitting: Podiatry

## 2023-01-02 ENCOUNTER — Telehealth: Payer: Self-pay | Admitting: Family Medicine

## 2023-01-02 ENCOUNTER — Encounter: Payer: PPO | Admitting: Family Medicine

## 2023-01-02 ENCOUNTER — Encounter: Payer: Self-pay | Admitting: Family Medicine

## 2023-01-02 NOTE — Telephone Encounter (Signed)
test

## 2023-01-22 ENCOUNTER — Other Ambulatory Visit: Payer: Self-pay | Admitting: Gastroenterology

## 2023-01-24 ENCOUNTER — Ambulatory Visit: Payer: PPO | Admitting: Podiatry

## 2023-02-03 ENCOUNTER — Ambulatory Visit: Payer: PPO | Admitting: Podiatry

## 2023-02-03 ENCOUNTER — Encounter: Payer: Self-pay | Admitting: Podiatry

## 2023-02-03 DIAGNOSIS — M79675 Pain in left toe(s): Secondary | ICD-10-CM | POA: Diagnosis not present

## 2023-02-03 DIAGNOSIS — M79674 Pain in right toe(s): Secondary | ICD-10-CM | POA: Diagnosis not present

## 2023-02-03 DIAGNOSIS — B351 Tinea unguium: Secondary | ICD-10-CM | POA: Diagnosis not present

## 2023-02-03 DIAGNOSIS — E1142 Type 2 diabetes mellitus with diabetic polyneuropathy: Secondary | ICD-10-CM | POA: Diagnosis not present

## 2023-02-03 NOTE — Progress Notes (Signed)
This patient returns to my office for at risk foot care.  This patient requires this care by a professional since this patient will be at risk due to having DM with neuropathy.  This patient is unable to cut nails herself since the patient cannot reach her nails.These nails are painful walking and wearing shoes.  Patient has cramping through both feet at night for the last 4 weeks.This patient presents for at risk foot care today.  General Appearance  Alert, conversant and in no acute stress.  Vascular  Dorsalis pedis and posterior tibial  pulses are palpable  bilaterally.  Capillary return is within normal limits  bilaterally. Temperature is within normal limits  bilaterally.  Neurologic  Senn-Weinstein monofilament wire test absent   bilaterally. Muscle power within normal limits bilaterally.  Nails Thick disfigured discolored nails with subungual debris  from hallux to fifth toes bilaterally. No evidence of bacterial infection or drainage bilaterally.  Orthopedic  No limitations of motion  feet .  No crepitus or effusions noted.  No bony pathology or digital deformities noted. Hallux limitus 1st MPJ  B/L.  Skin  normotropic skin with no porokeratosis noted bilaterally.  No signs of infections or ulcers noted.     Onychomycosis  Pain in right toes  Pain in left toes  Plantar faciitis  secondary to  Hallux limitus  B/L.  Consent was obtained for treatment procedures.   Mechanical debridement of nails 1-5  bilaterally performed with a nail nipper.  Filed with dremel without incident.    Return office visit   4 months                   Told patient to return for periodic foot care and evaluation due to potential at risk complications.   Gardiner Barefoot DPM

## 2023-02-10 ENCOUNTER — Other Ambulatory Visit: Payer: Self-pay | Admitting: Nurse Practitioner

## 2023-02-10 ENCOUNTER — Other Ambulatory Visit: Payer: Self-pay | Admitting: Gastroenterology

## 2023-02-10 DIAGNOSIS — J019 Acute sinusitis, unspecified: Secondary | ICD-10-CM

## 2023-02-11 ENCOUNTER — Other Ambulatory Visit: Payer: Self-pay | Admitting: Endocrinology

## 2023-03-10 NOTE — Progress Notes (Unsigned)
Patient ID: Ashley Armstrong, female   DOB: [birth year], 57 y.o.   MRN: [id number]           Reason for Appointment: Follow-up for Type 1 Diabetes  Referring physician: Dr. [name]   History of Present Illness:          Date of diagnosis of type 1 diabetes mellitus: Age 85       Background history:  She has been on insulin practically since her diagnosis was made.  Initially she had symptoms of weight loss and blurred vision For several years has been on Lantus and NovoLog but usually has had poor control with A1c mostly between 8-10%  Recent history:     INSULIN regimen is: TOUJEO 70 units in a.m.  NovoLog at mealtimes mostly 8-10 units    Current management, blood sugar patterns and problems identified:  Her A1c is again higher than target at 9.5  She has not been since 8/22 She says she was not able to get Levemir because of supply issues and did not let us know She is also going up to 10 units on her basal insulin in the morning without improvement in her control Was told to switch once a day Toujeo to Levemir twice a day on the last visit after she finished but she did not call  She thinks that most of her high sugars during the day are from poor diet and eating sweets and unbalanced meals Despite repeated reminders to increase her mealtime insulin coverage she is actually taking less than before She also did not take her NovoLog on time when blood sugars are near normal before eating She again refuses to consider the insulin pump          Meal times are:  Breakfast is 7 AM at Lunch:  PM dinner:3 PM  Typical meal intake: Breakfast is sometimes high fat but otherwise eggs and toast.  Lunch usually a mixed meal and evening meal may be a sandwich with bland food.  Snacks will be cheese and crackers Usually avoiding drinks with sugar            Exercise: None     Dates of Recording: Last 2 weeks  Sensor description: Libre 2.  Interpretation is as  follows  Overnight blood sugars are mostly lower between 1 AM-5 AM with occasional hypoglycemia around 3 AM but otherwise may sometimes show hyperglycemia also Blood sugars are above the target range on an average consistently the rest of the day HIGHEST blood sugars are mid afternoon averaging 252 between 2-4 PM HYPERGLYCEMIA is present periodically at all times and frequently has rise in the blood sugar after meals are rebounding from low normal sugars Pre-meal blood sugars are in the 200+ range at all times although FASTING blood sugars are averaging closer to 180 Significant variability present with GV 34 HYPOGLYCEMIA is seen only as above overnight occasionally  Results statistics:  CGM use % of time 97  2-week average/GV 210  Time in range      35  %  % Time Above 180 35  % Time above 250 29  % Time Below 70 1     Previously:  CGM use % of time 97  2-week average/GV 207/33  Time in range      34%, was 26  % Time Above 180 38  % Time above 250 26  % Time Below 70 2     Dietician visit, most recent: 11/18  Weight history:  Wt Readings from Last 3 Encounters:  12/10/22 121 lb (54.9 kg)  12/03/22 120 lb (54.4 kg)  11/21/22 119 lb 1.9 oz (54 kg)    Glycemic control:   Lab Results  Component Value Date   HGBA1C 9.5 (H) 11/21/2022   HGBA1C 8.5 (A) 07/02/2022   HGBA1C 8.9 (H) 04/10/2022   Lab Results  Component Value Date   MICROALBUR 7.6 (H) 02/06/2022   LDLCALC 70 11/21/2022   CREATININE 1.11 (H) 11/21/2022   Lab Results  Component Value Date   MICRALBCREAT 8.1 02/06/2022    No results found for: "FRUCTOSAMINE"  No visits with results within 1 Week(s) from this visit.  Latest known visit with results is:  Abstract on 11/22/2022  Component Date Value Ref Range Status   HM Diabetic Eye Exam 11/20/2022 Retinopathy (A)  No Retinopathy Final    Allergies as of 03/11/2023   No Known Allergies      Medication List        Accurate as of March 10, 2023  8:52 PM. If you have any questions, ask your nurse or doctor.          chlorpheniramine 4 MG tablet Commonly known as: CHLOR-TRIMETON Take one tablet by mouth once daily for 3 days, then as needed, for uncontrolled allergies and ear pressure   diazepam 10 MG tablet Commonly known as: VALIUM as needed.   Durezol 0.05 % Emul Generic drug: Difluprednate Apply 1 drop to eye daily at 8 pm.   Fish Oil 1000 MG Caps Take 1 capsule by mouth daily.   fluticasone 50 MCG/ACT nasal spray Commonly known as: FLONASE USE TWO SPRAYS IN BOTH NOSTRILS DAILY   FreeStyle Libre 3 Reader Devi 1 Device by Does not apply route continuous.   FreeStyle Libre 3 Sensor Misc Place 1 sensor on the skin every 14 days. Use to check glucose continuously   Lantus SoloStar 100 UNIT/ML Solostar Pen Generic drug: insulin glargine 50 units a.m. and 15 units at bedtime   lisinopril 2.5 MG tablet Commonly known as: ZESTRIL TAKE ONE (1) TABLET BY MOUTH EVERY DAY   metoCLOPramide 5 MG tablet Commonly known as: REGLAN TAKE 1 TO 2 TABLETS BY MOUTH EVERY 6 HOURS AS NEEDED FOR NAUSEA OR VOMITING.   montelukast 10 MG tablet Commonly known as: SINGULAIR TAKE ONE TABLET (10MG  TOTAL) BY MOUTH ATBEDTIME   NovoLOG FlexPen 100 UNIT/ML FlexPen Generic drug: insulin aspart 16-18 units  at meals   ondansetron 4 MG tablet Commonly known as: ZOFRAN TAKE ONE TABLET BY MOUTH EVERY EIGHT HOURS AS NEEDED FOR NAUSEA OR VOMITING   OneTouch Delica Lancets 33G Misc USE FOUR TIMES A DAY AS DIRECTED.   OneTouch Verio Flex System w/Device Kit USE TO TEST BLOOD SUGAR TWO TIMES A DAY   OneTouch Verio test strip Generic drug: glucose blood USE AS BACKUP FOR SENSOR, ONCE A DAY AS NEEDED   pantoprazole 40 MG tablet Commonly known as: PROTONIX TAKE ONE TABLET (40MG  TOTAL) BY MOUTH TWO TIMES DAILY BEFORE A MEAL ONCE SYMPTOMS CONTROLLED, CUT BACK TO ONCE DAILY IF TOLERATED   polyethylene glycol 17 g packet Commonly  known as: MIRALAX / GLYCOLAX Take 17 g by mouth daily as needed.   rosuvastatin 40 MG tablet Commonly known as: CRESTOR TAKE ONE (1) TABLET BY MOUTH EVERY DAY   sodium chloride 0.65 % nasal spray Commonly known as: OCEAN Place 1 spray into the nose as needed for congestion.   Sure Comfort Pen  Needles 30G X 8 MM Misc Generic drug: Insulin Pen Needle USE FIVE TIMES A DAY   vitamin B-12 100 MCG tablet Commonly known as: CYANOCOBALAMIN Take 1 tablet by mouth daily.   VITAMIN D PO Take 1 Dose by mouth daily.        Allergies: No Known Allergies  Past Medical History:  Diagnosis Date   Anxiety    Dyslipidemia    Gastroparesis due to DM (HCC) OCT 2014   75% AT 2 HRS, GLU >    H/O eye surgery 01/2018   Headache(784.0)    IDDM (insulin dependent diabetes mellitus)    Neuropathy    Nicotine addiction    TIA (transient ischemic attack)    Dr. Gerilyn Pilgrim    Past Surgical History:  Procedure Laterality Date   Cataract surgery Bilateral 01/20/2017, 02/03/2017   COLONOSCOPY N/A 05/09/2016   Dr. Darrick Penna: 2 hyperplastic polyps removed.  Next colonoscopy in 2027   ESOPHAGOGASTRODUODENOSCOPY N/A 08/31/2013   SLF: 1. Earky satiety nausea may be due to Gastroparesis/pyloric channel stenosis. 2. small hiatal hernia 3. Moderate erosive gastritis.    EYE SURGERY  01/2018   laser surgery bilateral eye      Family History  Problem Relation Age of Onset   Hypertension Mother    Diabetes Mother    Hyperlipidemia Mother    Rosacea Mother    Hypertension Father    Diabetes Brother    Breast cancer Maternal Aunt    Colon cancer Neg Hx     Social History:  reports that she quit smoking about 11 years ago. Her smoking use included cigarettes. She has a 36.00 pack-year smoking history. She has never used smokeless tobacco. She reports that she does not drink alcohol and does not use drugs.   Review of Systems   Lipid history: Followed by PCP, currently on Crestor 40 mg Labs as  follows She has aortic atherosclerosis on  CT scan   Lab Results  Component Value Date   CHOL 160 11/21/2022   HDL 79 11/21/2022   LDLCALC 70 11/21/2022   TRIG 51 11/21/2022   CHOLHDL 2.0 11/21/2022           Blood pressure history:  BP Readings from Last 3 Encounters:  12/10/22 112/68  12/03/22 117/77  11/21/22 108/67    Most recent eye exam was in 1/24  Most recent foot exam: 2/23  GASTROPARESIS: Has symptoms of fullness and nausea, will take Zofran as needed  Followed by gastroenterologist  She is on Reglan before meals, taking 5 mg only as needed No nausea now   Currently known complications of diabetes: Gastroparesis, neuropathy, retinopathy, no nephropathy  LABS:  No visits with results within 1 Week(s) from this visit.  Latest known visit with results is:  Abstract on 11/22/2022  Component Date Value Ref Range Status   HM Diabetic Eye Exam 11/20/2022 Retinopathy (A)  No Retinopathy Final    Physical Examination:  LMP 07/19/2013        ASSESSMENT:  Diabetes type 1, poorly controlled with multiple complications  See history of present illness for detailed discussion of current diabetes management, blood sugar patterns from her freestyle libre CGM download and problems identified  Her A1c is consistently over 8% and now 9.5  She is on basal bolus insulin with Toujeo once a day and mealtime NovoLog  She has brought hyperglycemia during the day with inadequate coverage of her meals but also has before appears to be needing higher doses of  basal insulin during the day Timing of her NovoLog may also be improved She has been watching her diet poorly and getting excessive carbohydrates  LIPIDS well-controlled   PLAN:    She will take 16 to 18 units of NovoLog with meal coverage for all meals especially in the morning needs higher doses Also timing of insulin was discussed She will need to try and moderate her diet and avoid high carbohydrate meals and  snacks and get more balanced meals and fiber  Advised her to follow instructions given on her visit summary Also may be given a trial of LANTUS on the next prescription  Empirically will start her on Lantus 50 units in the morning and 15 at bedtime in the evening  Will try to get her the Dexcom sensor if available Recommend follow-up with the diabetes educator  More regular follow-up, she can come back in 3 months   There are no Patient Instructions on file for this visit.  Total visit time including counseling = 30 minutes  Reather Littler 03/10/2023, 8:52 PM   Note: This office note was prepared with Dragon voice recognition system technology. Any transcriptional errors that result from this process are unintentional.

## 2023-03-11 ENCOUNTER — Encounter: Payer: Self-pay | Admitting: Endocrinology

## 2023-03-11 ENCOUNTER — Ambulatory Visit: Payer: PPO | Admitting: Endocrinology

## 2023-03-11 VITALS — BP 112/68 | HR 75 | Ht 67.0 in | Wt 123.0 lb

## 2023-03-11 DIAGNOSIS — E1043 Type 1 diabetes mellitus with diabetic autonomic (poly)neuropathy: Secondary | ICD-10-CM | POA: Diagnosis not present

## 2023-03-11 DIAGNOSIS — K3184 Gastroparesis: Secondary | ICD-10-CM

## 2023-03-11 DIAGNOSIS — E1065 Type 1 diabetes mellitus with hyperglycemia: Secondary | ICD-10-CM

## 2023-03-11 LAB — POCT GLYCOSYLATED HEMOGLOBIN (HGB A1C): Hemoglobin A1C: 9 % — AB (ref 4.0–5.6)

## 2023-03-11 LAB — MICROALBUMIN / CREATININE URINE RATIO
Creatinine,U: 95.5 mg/dL
Microalb Creat Ratio: 5.4 mg/g (ref 0.0–30.0)
Microalb, Ur: 5.2 mg/dL — ABNORMAL HIGH (ref 0.0–1.9)

## 2023-03-11 MED ORDER — FREESTYLE LIBRE 3 READER DEVI: 1.0000 | Freq: Once | 0 refills | Status: DC

## 2023-03-11 MED ORDER — FREESTYLE LIBRE 3 SENSOR MISC: 2 refills | Status: DC

## 2023-03-11 NOTE — Patient Instructions (Addendum)
Take Novolog 10 Units for oatmeal and 16 for other meals  Toujeo 40 in am and 20 in pm, if lo at nite go to 15 in pm  Try 10 mg Reglan at supper

## 2023-03-14 ENCOUNTER — Encounter: Payer: Self-pay | Admitting: Endocrinology

## 2023-03-25 DIAGNOSIS — F411 Generalized anxiety disorder: Secondary | ICD-10-CM | POA: Diagnosis not present

## 2023-03-27 ENCOUNTER — Encounter: Payer: Self-pay | Admitting: Family Medicine

## 2023-03-27 ENCOUNTER — Ambulatory Visit (INDEPENDENT_AMBULATORY_CARE_PROVIDER_SITE_OTHER): Payer: PPO | Admitting: Family Medicine

## 2023-03-27 VITALS — BP 99/63 | HR 76 | Ht 67.0 in | Wt 123.0 lb

## 2023-03-27 DIAGNOSIS — Z0001 Encounter for general adult medical examination with abnormal findings: Secondary | ICD-10-CM

## 2023-03-27 DIAGNOSIS — E785 Hyperlipidemia, unspecified: Secondary | ICD-10-CM | POA: Diagnosis not present

## 2023-03-27 DIAGNOSIS — Z1231 Encounter for screening mammogram for malignant neoplasm of breast: Secondary | ICD-10-CM | POA: Diagnosis not present

## 2023-03-27 DIAGNOSIS — Z1211 Encounter for screening for malignant neoplasm of colon: Secondary | ICD-10-CM

## 2023-03-27 NOTE — Assessment & Plan Note (Signed)
Annual exam as documented. . Immunization and cancer screening needs are specifically addressed at this visit.  

## 2023-03-27 NOTE — Patient Instructions (Addendum)
F/U mid to end August, cal; if you need me sooner  Nurse pls send for and document shingrix #2  given at [facility] [address] in 2023  Please order cologuard test for pt  Mammogram to be scheduled at checkout  Lipid, cmp and EGFr and CBC today  Keep up the GREAT work with your diabetes, you're health will improve greatly  It is important that you exercise regularly at least 30 minutes 5 times a week. If you develop chest pain, have severe difficulty breathing, or feel very tired, stop exercising immediately and seek medical attention  Think about what you will eat, plan ahead. Choose " clean, green, fresh or frozen" over canned, processed or packaged foods which are more sugary, salty and fatty. 70 to 75% of food eaten should be vegetables and fruit. Three meals at set times with snacks allowed between meals, but they must be fruit or vegetables. Aim to eat over a 12 hour period , example 7 am to 7 pm, and STOP after  your last meal of the day. Drink water,generally about 64 ounces per day, no other drink is as healthy. Fruit juice is best enjoyed in a healthy way, by EATING the fruit. Thanks for choosing [facility], we consider it a privelige to serve you.

## 2023-03-27 NOTE — Progress Notes (Signed)
    Ashley Armstrong     MRN: [id number]      DOB: 1966-03-04  Chief Complaint  Patient presents with   Annual Exam    CPE    HPI: Patient is in for annual physical exam. No other health concerns are expressed or addressed at the visit. Recent labs,  are reviewed. Immunization is reviewed , and  updated if needed.   PE: BP 99/63 (BP Location: Left Arm, Patient Position: Sitting, Cuff Size: Normal)   Pulse 76   Ht 5\' 7"  (1.702 m)   Wt 123 lb (55.8 kg)   LMP 07/19/2013   SpO2 95%   BMI 19.26 kg/m   Pleasant  female, alert and oriented x 3, in no cardio-pulmonary distress. Afebrile. HEENT No facial trauma or asymetry. Sinuses non tender.  Extra occullar muscles intact.. External ears normal, . Neck: supple, no adenopathy,JVD or thyromegaly.No bruits.  Chest: Clear to ascultation bilaterally.No crackles or wheezes. Non tender to palpation    Cardiovascular system; Heart sounds normal,  S1 and  S2 ,no S3.  No murmur, or thrill. Peripheral pulses normal.  Abdomen: Soft, non tender    Musculoskeletal exam: Full ROM of spine, hips , shoulders and knees. No deformity ,swelling or crepitus noted. No muscle wasting or atrophy.   Neurologic: Cranial nerves 2 to 12 intact. Power, tone ,sensation  normal throughout. No disturbance in gait. No tremor.  Skin: Intact, no ulceration, erythema , scaling or rash noted. Pigmentation normal throughout  Psych; Normal mood and affect. Judgement and concentration normal   Assessment & Plan:  No problem-specific Assessment & Plan notes found for this encounter.

## 2023-03-27 NOTE — Assessment & Plan Note (Signed)
Hyperlipidemia:Low fat diet discussed and encouraged.   Lipid Panel  Lab Results  Component Value Date   CHOL 160 11/21/2022   HDL 79 11/21/2022   LDLCALC 70 11/21/2022   TRIG 51 11/21/2022   CHOLHDL 2.0 11/21/2022     Updated lab needed .

## 2023-03-28 LAB — CMP14+EGFR
ALT: 17 IU/L (ref 0–32)
AST: 20 IU/L (ref 0–40)
Albumin/Globulin Ratio: 1.9 (ref 1.2–2.2)
Albumin: 4.3 g/dL (ref 3.8–4.9)
Alkaline Phosphatase: 97 IU/L (ref 44–121)
BUN/Creatinine Ratio: 17 (ref 9–23)
BUN: 19 mg/dL (ref 6–24)
Bilirubin Total: 0.5 mg/dL (ref 0.0–1.2)
CO2: 21 mmol/L (ref 20–29)
Calcium: 9.4 mg/dL (ref 8.7–10.2)
Chloride: 104 mmol/L (ref 96–106)
Creatinine, Ser: 1.11 mg/dL — ABNORMAL HIGH (ref 0.57–1.00)
Globulin, Total: 2.3 g/dL (ref 1.5–4.5)
Glucose: 171 mg/dL — ABNORMAL HIGH (ref 70–99)
Potassium: 4.7 mmol/L (ref 3.5–5.2)
Sodium: 140 mmol/L (ref 134–144)
Total Protein: 6.6 g/dL (ref 6.0–8.5)
eGFR: 59 mL/min/{1.73_m2} — ABNORMAL LOW (ref >59)

## 2023-03-28 LAB — CBC
Hematocrit: 38.6 % (ref 34.0–46.6)
Hemoglobin: 12.6 g/dL (ref 11.1–15.9)
MCH: 29.2 pg (ref 26.6–33.0)
MCHC: 32.6 g/dL (ref 31.5–35.7)
MCV: 90 fL (ref 79–97)
Platelets: 249 10*3/uL (ref 150–450)
RBC: 4.31 x10E6/uL (ref 3.77–5.28)
RDW: 12.5 % (ref 11.7–15.4)
WBC: 6.4 10*3/uL (ref 3.4–10.8)

## 2023-03-28 LAB — LIPID PANEL
Chol/HDL Ratio: 2 ratio (ref 0.0–4.4)
Cholesterol, Total: 166 mg/dL (ref 100–199)
HDL: 81 mg/dL (ref >39)
LDL Chol Calc (NIH): 74 mg/dL (ref 0–99)
Triglycerides: 55 mg/dL (ref 0–149)
VLDL Cholesterol Cal: 11 mg/dL (ref 5–40)

## 2023-04-08 ENCOUNTER — Other Ambulatory Visit: Payer: Self-pay | Admitting: Endocrinology

## 2023-04-08 DIAGNOSIS — E1140 Type 2 diabetes mellitus with diabetic neuropathy, unspecified: Secondary | ICD-10-CM

## 2023-04-29 ENCOUNTER — Ambulatory Visit (INDEPENDENT_AMBULATORY_CARE_PROVIDER_SITE_OTHER): Payer: PPO | Admitting: Internal Medicine

## 2023-04-29 ENCOUNTER — Encounter: Payer: Self-pay | Admitting: Internal Medicine

## 2023-04-29 VITALS — BP 110/76

## 2023-04-29 DIAGNOSIS — Z0000 Encounter for general adult medical examination without abnormal findings: Secondary | ICD-10-CM

## 2023-04-29 NOTE — Progress Notes (Signed)
Subjective:   Ashley Armstrong is a 58 y.o. female who presents for Medicare Annual (Subsequent) preventive examination.  Visit Complete: Virtual  I connected with  Ashley Armstrong on 04/29/23 by a audio enabled telemedicine application and verified that I am speaking with the correct person using two identifiers.  Patient Location: Home  Provider Location: Office/Clinic  I discussed the limitations of evaluation and management by telemedicine. The patient expressed understanding and agreed to proceed.  Patient Medicare AWV questionnaire was not completed by the patient.  Review of Systems    Review of Systems  All other systems reviewed and are negative.   Objective:   .js Today's Vitals   04/29/23 1306  BP: 110/76  PainSc: 0-No pain   There is no height or weight on file to calculate BMI.     04/29/2023    1:12 PM 04/24/2022   10:59 AM 05/18/2018    1:40 PM 03/11/2018    9:38 AM 02/12/2017    9:33 AM 11/05/2016    5:05 PM 05/09/2016   12:12 PM  Advanced Directives  Does Patient Have a Medical Advance Directive? No No No No No No No  Would patient like information on creating a medical advance directive? Yes (MAU/Ambulatory/Procedural Areas - Information given) No - Patient declined  No - Patient declined Yes (MAU/Ambulatory/Procedural Areas - Information given) No - Patient declined No - patient declined information    Current Medications (verified) Outpatient Encounter Medications as of 04/29/2023  Medication Sig   Blood Glucose Monitoring Suppl (ONETOUCH VERIO FLEX SYSTEM) w/Device KIT USE TO TEST BLOOD SUGAR TWO TIMES A DAY   chlorpheniramine (CHLOR-TRIMETON) 4 MG tablet Take one tablet by mouth once daily for 3 days, then as needed, for uncontrolled allergies and ear pressure   Continuous Blood Gluc Receiver (FREESTYLE LIBRE 3 READER) DEVI 1 Device by Does not apply route continuous.   Continuous Glucose Receiver (FREESTYLE LIBRE 3 READER) DEVI 1 Device by Does not apply  route once for 1 dose.   Continuous Glucose Sensor (FREESTYLE LIBRE 3 SENSOR) MISC Place 1 sensor on the skin every 14 days. Use to check glucose continuously   diazepam (VALIUM) 10 MG tablet as needed.    DUREZOL 0.05 % EMUL Apply 1 drop to eye daily at 8 pm.   fluticasone (FLONASE) 50 MCG/ACT nasal spray USE TWO SPRAYS IN BOTH NOSTRILS DAILY   insulin aspart (NOVOLOG FLEXPEN) 100 UNIT/ML FlexPen 16-18 units  at meals   insulin glargine (LANTUS SOLOSTAR) 100 UNIT/ML Solostar Pen 50 units a.m. and 15 units at bedtime   Insulin Pen Needle (SURE COMFORT PEN NEEDLES) 30G X 8 MM MISC USE FIVE TIMES A DAY   lisinopril (ZESTRIL) 2.5 MG tablet TAKE ONE (1) TABLET BY MOUTH EVERY DAY   metoCLOPramide (REGLAN) 5 MG tablet TAKE 1 TO 2 TABLETS BY MOUTH EVERY 6 HOURS AS NEEDED FOR NAUSEA OR VOMITING.   montelukast (SINGULAIR) 10 MG tablet TAKE ONE TABLET (10MG  TOTAL) BY MOUTH ATBEDTIME   Omega-3 Fatty Acids (FISH OIL) 1000 MG CAPS Take 1 capsule by mouth daily.   ondansetron (ZOFRAN) 4 MG tablet TAKE ONE TABLET BY MOUTH EVERY EIGHT HOURS AS NEEDED FOR NAUSEA OR VOMITING   ONETOUCH DELICA LANCETS 33G MISC USE FOUR TIMES A DAY AS DIRECTED.   ONETOUCH VERIO test strip USE AS BACKUP FOR SENSOR, ONCE A DAY AS NEEDED   pantoprazole (PROTONIX) 40 MG tablet TAKE ONE TABLET (40MG  TOTAL) BY MOUTH TWO TIMES DAILY BEFORE  A MEAL ONCE SYMPTOMS CONTROLLED, CUT BACK TO ONCE DAILY IF TOLERATED   rosuvastatin (CRESTOR) 40 MG tablet TAKE ONE (1) TABLET BY MOUTH EVERY DAY   sodium chloride (OCEAN) 0.65 % nasal spray Place 1 spray into the nose as needed for congestion.   vitamin B-12 (CYANOCOBALAMIN) 100 MCG tablet Take 1 tablet by mouth daily.   VITAMIN D PO Take 1 Dose by mouth daily.   No facility-administered encounter medications on file as of 04/29/2023.    Allergies (verified) Patient has no known allergies.   History: Past Medical History:  Diagnosis Date   Anxiety    Dyslipidemia    Gastroparesis due to DM  (HCC) OCT 2014   75% AT 2 HRS, GLU >    H/O eye surgery 01/2018   Headache(784.0)    IDDM (insulin dependent diabetes mellitus)    Neuropathy    Nicotine addiction    TIA (transient ischemic attack)    Dr. Gerilyn Pilgrim   Past Surgical History:  Procedure Laterality Date   Cataract surgery Bilateral 01/20/2017, 02/03/2017   COLONOSCOPY N/A 05/09/2016   Dr. Darrick Penna: 2 hyperplastic polyps removed.  Next colonoscopy in 2027   ESOPHAGOGASTRODUODENOSCOPY N/A 08/31/2013   SLF: 1. Earky satiety nausea may be due to Gastroparesis/pyloric channel stenosis. 2. small hiatal hernia 3. Moderate erosive gastritis.    EYE SURGERY  01/2018   laser surgery bilateral eye     Family History  Problem Relation Age of Onset   Hypertension Mother    Diabetes Mother    Hyperlipidemia Mother    Rosacea Mother    Hypertension Father    Diabetes Brother    Breast cancer Maternal Aunt    Colon cancer Neg Hx    Social History   Socioeconomic History   Marital status: Single    Spouse name: Not on file   Number of children: Not on file   Years of education: Not on file   Highest education level: Not on file  Occupational History   Occupation: disabled  Tobacco Use   Smoking status: Former    Packs/day: 1.50    Years: 24.00    Additional pack years: 0.00    Total pack years: 36.00    Types: Cigarettes    Quit date: 08/24/2011    Years since quitting: 11.6   Smokeless tobacco: Never  Vaping Use   Vaping Use: Never used  Substance and Sexual Activity   Alcohol use: No   Drug use: No   Sexual activity: Not Currently    Birth control/protection: None  Other Topics Concern   Not on file  Social History Narrative   Disabled, lives with a room mate   Social Determinants of Health   Financial Resource Strain: Low Risk  (04/24/2022)   Overall Financial Resource Strain (CARDIA)    Difficulty of Paying Living Expenses: Not hard at all  Food Insecurity: No Food Insecurity (04/24/2022)   Hunger Vital  Sign    Worried About Running Out of Food in the Last Year: Never true    Ran Out of Food in the Last Year: Never true  Transportation Needs: No Transportation Needs (04/24/2022)   PRAPARE - Administrator, Civil Service (Medical): No    Lack of Transportation (Non-Medical): No  Physical Activity: Insufficiently Active (04/29/2023)   Exercise Vital Sign    Days of Exercise per Week: 7 days    Minutes of Exercise per Session: 20 min  Stress: Stress Concern Present (04/24/2022)  [employer] of Occupational Health - Occupational Stress Questionnaire    Feeling of Stress : To some extent  Social Connections: Moderately Isolated (04/24/2022)   Social Connection and Isolation Panel [NHANES]    Frequency of Communication with Friends and Family: More than three times a week    Frequency of Social Gatherings with Friends and Family: More than three times a week    Attends Religious Services: More than 4 times per year    Active Member of [employer] or Organizations: No    Attends Engineer, structural: Never    Marital Status: Never married    Tobacco Counseling Counseling given: Not Answered   Clinical Intake:  Pre-visit preparation completed: Yes  Pain : No/denies pain Pain Score: 0-No pain     Diabetes: Yes CBG done?: Yes (151, after lunch) CBG resulted in Enter/ Edit results?: Yes Did pt. bring in CBG monitor from home?: No  How often do you need to have someone help you when you read instructions, pamphlets, or other written materials from your doctor or pharmacy?: 1 - Never What is the last grade level you completed in school?: 9th grade         Activities of Daily Living    04/29/2023    1:08 PM  In your present state of health, do you have any difficulty performing the following activities:  Hearing? 0  Vision? 0  Difficulty concentrating or making decisions? 0  Walking or climbing stairs? 0  Dressing or bathing? 0  Doing errands, shopping?  0    Patient Care Team: Kerri Perches, MD as PCP - General (Family Medicine) West Bali, MD (Inactive) as Consulting Physician (Gastroenterology) Stephannie Li, MD as Consulting Physician (Ophthalmology) Mia Creek, MD as Consulting Physician (Ophthalmology) Laqueta Linden, MD (Inactive) as Attending Physician (Cardiology) Asencion Islam, DPM as Consulting Physician (Podiatry)  Indicate any recent Medical Services you may have received from other than [facility] providers in the past year (date may be approximate).     Assessment:   This is a routine wellness examination for Ashley Armstrong.  Hearing/Vision screen No results found.  Dietary issues and exercise activities discussed:     Goals Addressed   None    Depression Screen    04/29/2023    1:14 PM 03/27/2023    9:23 AM 12/03/2022    2:37 PM 11/21/2022    2:38 PM 08/09/2022    8:37 AM 04/24/2022   11:00 AM 04/24/2022   10:57 AM  PHQ 2/9 Scores  PHQ - 2 Score 2 3 3 2 2  0 0  PHQ- 9 Score  9 13 11 7       Fall Risk    04/29/2023    1:13 PM 03/27/2023    9:23 AM 11/21/2022    2:37 PM 08/09/2022    8:37 AM 04/24/2022   11:00 AM  Fall Risk   Falls in the past year? 0 0 0 0 0  Number falls in past yr: 0  0 0 0  Injury with Fall?  0 0 0 0  Risk for fall due to :  No Fall Risks No Fall Risks No Fall Risks No Fall Risks  Follow up  Falls evaluation completed Falls evaluation completed Falls evaluation completed Falls evaluation completed    MEDICARE RISK AT HOME:  Medicare Risk at Home - 04/29/23 1313     Any stairs in or around the home? No    If so, are there any without  handrails? No    Home free of loose throw rugs in walkways, pet beds, electrical cords, etc? Yes    Adequate lighting in your home to reduce risk of falls? Yes    Life alert? No    Use of a cane, walker or w/c? No    Grab bars in the bathroom? Yes    Shower chair or bench in shower? No    Elevated toilet seat or a handicapped toilet? Yes              TIMED UP AND GO:  Was the test performed?  No    Cognitive Function:    04/24/2022   11:00 AM  MMSE - Mini Mental State Exam  Not completed: Unable to complete        04/29/2023    1:14 PM 04/24/2022   11:01 AM 04/23/2021    2:57 PM 03/20/2020    2:54 PM 03/18/2019    2:29 PM  6CIT Screen  What Year? 0 points 0 points 0 points 0 points 0 points  What month? 0 points 0 points 0 points 0 points 0 points  What time? 0 points 0 points 0 points 0 points 0 points  Count back from 20 0 points 2 points 0 points 0 points 0 points  Months in reverse 0 points 4 points 0 points 0 points 0 points  Repeat phrase 0 points 0 points 0 points 0 points 0 points  Total Score 0 points 6 points 0 points 0 points 0 points    Immunizations Immunization History  Administered Date(s) Administered   H1N1 10/24/2008   Influenza Whole 09/24/2007, 08/09/2010   Influenza,inj,Quad PF,6+ Mos 07/29/2013, 10/18/2014, 08/29/2015, 09/23/2016, 07/16/2017, 10/05/2018, 07/26/2019, 07/06/2020, 12/12/2021, 08/09/2022   Influenza-Unspecified 08/08/2017, 10/07/2018   Moderna Sars-Covid-2 Vaccination 03/01/2020, 04/04/2020, 11/25/2020   Pneumococcal Polysaccharide-23 03/19/2013, 06/02/2018   Td 04/11/2004   Tdap 06/16/2014   Zoster Recombinat (Shingrix) 07/10/2022    TDAP status: Up to date  Flu Vaccine status: Up to date  Pneumococcal vaccine status: Up to date  Covid-19 vaccine status: Information provided on how to obtain vaccines.   Qualifies for Shingles Vaccine? Yes   Zostavax completed No   Shingrix Completed?: YesNeeds to bring documentation of second dose  Screening Tests Health Maintenance  Topic Date Due   Zoster Vaccines- Shingrix (2 of 2) 09/04/2022   Medicare Annual Wellness (AWV)  04/25/2023   INFLUENZA VACCINE  06/12/2023   HEMOGLOBIN A1C  09/10/2023   OPHTHALMOLOGY EXAM  11/21/2023   FOOT EXAM  11/22/2023   Diabetic kidney evaluation - Urine ACR  03/10/2024   Diabetic  kidney evaluation - eGFR measurement  03/26/2024   DTaP/Tdap/Td (3 - Td or Tdap) 06/16/2024   MAMMOGRAM  08/16/2024   PAP SMEAR-Modifier  04/10/2025   Colonoscopy  05/09/2026   Hepatitis C Screening  Completed   HIV Screening  Completed   HPV VACCINES  Aged Out   Lung Cancer Screening  Discontinued   COVID-19 Vaccine  Discontinued    Health Maintenance  Health Maintenance Due  Topic Date Due   Zoster Vaccines- Shingrix (2 of 2) 09/04/2022   Medicare Annual Wellness (AWV)  04/25/2023    Colorectal cancer screening: Type of screening: Colonoscopy. Completed 05/09/2016. Repeat every 10 years  Mammogram status: Completed 03/27/2023. Repeat every year   Lung Cancer Screening: (Low Dose CT Chest recommended if Age 20-80 years, 20 pack-year currently smoking OR have quit w/in 15years.) does not qualify.    Additional  Screening:  Hepatitis C Screening: does not qualify; Completed 03/21/2021  Vision Screening: Recommended annual ophthalmology exams for early detection of glaucoma and other disorders of the eye. Is the patient up to date with their annual eye exam?  Yes  Who is the provider or what is the name of the office in which the patient attends annual eye exams? Dr.Sanders If pt is not established with a provider, would they like to be referred to a provider to establish care? No .   Dental Screening: Recommended annual dental exams for proper oral hygiene  Diabetic Foot Exam: Diabetic Foot Exam: Completed 02/03/2023  Community Resource Referral / Chronic Care Management: CRR required this visit?  No   CCM required this visit?  No     Plan:     I have personally reviewed and noted the following in the patient's chart:   Medical and social history Use of alcohol, tobacco or illicit drugs  Current medications and supplements including opioid prescriptions. Patient is not currently taking opioid prescriptions. Functional ability and status Nutritional status Physical  activity Advanced directives List of other physicians Hospitalizations, surgeries, and ER visits in previous 12 months Vitals Screenings to include cognitive, depression, and falls Referrals and appointments  In addition, I have reviewed and discussed with patient certain preventive protocols, quality metrics, and best practice recommendations. A written personalized care plan for preventive services as well as general preventive health recommendations were provided to patient.     Milus Banister, MD   04/29/2023   After Visit Summary: (MyChart) Due to this being a telephonic visit, the after visit summary with patients personalized plan was offered to patient via MyChart

## 2023-04-29 NOTE — Patient Instructions (Signed)
  Ms. Dercole , Thank you for taking time to come for your Medicare Wellness Visit. I appreciate your ongoing commitment to your health goals. Please review the following plan we discussed and let me know if I can assist you in the future.   These are the goals we discussed:  Goals      Patient Stated     She would like to lower hemoglobin A1c  She would like to exercise more to meet recommendations of 150 minutes per week        This is a list of the screening recommended for you and due dates:  Health Maintenance  Topic Date Due   Zoster (Shingles) Vaccine (2 of 2) 09/04/2022   Medicare Annual Wellness Visit  04/25/2023   Flu Shot  06/12/2023   Hemoglobin A1C  09/10/2023   Eye exam for diabetics  11/21/2023   Complete foot exam   11/22/2023   Yearly kidney health urinalysis for diabetes  03/10/2024   Yearly kidney function blood test for diabetes  03/26/2024   DTaP/Tdap/Td vaccine (3 - Td or Tdap) 06/16/2024   Mammogram  08/16/2024   Pap Smear  04/10/2025   Colon Cancer Screening  05/09/2026   Hepatitis C Screening  Completed   HIV Screening  Completed   HPV Vaccine  Aged Out   Screening for Lung Cancer  Discontinued   COVID-19 Vaccine  Discontinued

## 2023-05-12 ENCOUNTER — Other Ambulatory Visit: Payer: Self-pay | Admitting: Endocrinology

## 2023-05-12 ENCOUNTER — Other Ambulatory Visit: Payer: Self-pay | Admitting: Family Medicine

## 2023-05-12 DIAGNOSIS — E1140 Type 2 diabetes mellitus with diabetic neuropathy, unspecified: Secondary | ICD-10-CM

## 2023-06-09 ENCOUNTER — Ambulatory Visit: Payer: PPO | Admitting: Podiatry

## 2023-06-10 ENCOUNTER — Encounter: Payer: Self-pay | Admitting: Endocrinology

## 2023-06-10 ENCOUNTER — Ambulatory Visit: Payer: PPO | Admitting: Endocrinology

## 2023-06-10 ENCOUNTER — Ambulatory Visit: Payer: PPO | Admitting: Podiatry

## 2023-06-10 VITALS — BP 120/70 | HR 79 | Ht 67.0 in | Wt 120.6 lb

## 2023-06-10 DIAGNOSIS — E1042 Type 1 diabetes mellitus with diabetic polyneuropathy: Secondary | ICD-10-CM

## 2023-06-10 DIAGNOSIS — E1065 Type 1 diabetes mellitus with hyperglycemia: Secondary | ICD-10-CM

## 2023-06-10 DIAGNOSIS — M722 Plantar fascial fibromatosis: Secondary | ICD-10-CM | POA: Diagnosis not present

## 2023-06-10 DIAGNOSIS — E1043 Type 1 diabetes mellitus with diabetic autonomic (poly)neuropathy: Secondary | ICD-10-CM

## 2023-06-10 DIAGNOSIS — M79675 Pain in left toe(s): Secondary | ICD-10-CM | POA: Diagnosis not present

## 2023-06-10 DIAGNOSIS — K3184 Gastroparesis: Secondary | ICD-10-CM

## 2023-06-10 DIAGNOSIS — B351 Tinea unguium: Secondary | ICD-10-CM

## 2023-06-10 DIAGNOSIS — M79674 Pain in right toe(s): Secondary | ICD-10-CM | POA: Diagnosis not present

## 2023-06-10 DIAGNOSIS — R234 Changes in skin texture: Secondary | ICD-10-CM

## 2023-06-10 LAB — POCT GLYCOSYLATED HEMOGLOBIN (HGB A1C): Hemoglobin A1C: 8.3 % — AB (ref 4.0–5.6)

## 2023-06-10 NOTE — Progress Notes (Signed)
Patient ID: Ashley Armstrong, female   DOB: Apr 15, 1966, 57 y.o.   MRN: [id number]           Reason for Appointment: Follow-up for Type 1 Diabetes  Referring physician: Dr. Syliva Overman   History of Present Illness:          Date of diagnosis of type 1 diabetes mellitus: Age 30       Background history:  She has been on insulin practically since her diagnosis was made.  Initially she had symptoms of weight loss and blurred vision For several years has been on Lantus and NovoLog but usually has had poor control with A1c mostly between 8-10%  Recent history:     INSULIN regimen is: TOUJEO 50 a.m. -15 units p.m.  NovoLog at mealtimes mostly 16-18 units    Current management, blood sugar patterns and problems identified:  Her A1c is relatively better at 8.3 compared to 9 and 9.5 previously  Recent GMI 8%  She has finally made changes in her management including diet and insulin doses as recommended Has cut back on higher fat meals and snacks since her last visit such as chips and sweets like cakes Also her NovoLog was increased by at least 8 units compared to her last visit to help with postprandial hyperglycemia that was frequent With increasing her Toujeo in the morning her daytime blood sugars are better and she has increased her morning dose another 10 minutes With reducing evening dose her overnight readings are now trending to be lower early morning However her blood sugars are still variable with HYPERGLYCEMIA sporadically occurring at all times except early morning Postprandial readings overall still tend to be higher in the afternoon but not consistently  Time in range is better at 42% but still inadequate She usually refuses to consider the insulin pump        Meal times are:  Breakfast is 6-7 AM at Lunch: 3 PM, 8 pm  Typical meal intake: Breakfast is usually eggs and toast.  Lunch usually a mixed meal and evening meal may be a sandwich with bland food.  Snacks will be  cheese and crackers Usually avoiding drinks with sugar             Dates of Recording: Last 2 weeks  Sensor description: Libre 3.  Interpretation is as follows  Overnight blood sugars are usually averaging over 200 at midnight and then gradually decreasing with some variability and occasionally low normal or rarely below 70  Generally blood sugars start rising after 6 AM and overall are the highest around 2-3 PM The morning postprandial value itself blood sugars after breakfast is quite variable with no consistent pattern but no hypoglycemia After lunch also blood sugars are variable generally continue to rise or stay high in the afternoon  Blood sugars after evening meal are on an average not rising but may show occasional significantly high readings, less than the last few days  No hypoglycemia during the day   Results statistics:  CGM use % of time 97  2-week average/GV 196/33  Time in range     42   %  % Time Above 180 37  % Time above 250 20 was 34   % Time Below 70 1     PRE-MEAL Fasting Lunch Dinner Bedtime Overall  Glucose range:       Averages: 148  204     POST-MEAL PC Breakfast PC Lunch PC Dinner  Glucose range:  Averages: 194  224 204   Previously  CGM use % of time   2-week average/GV 216  Time in range 30       %  % Time Above 180 34  % Time above 250 34  % Time Below 70 1     PRE-MEAL Fasting Lunch Dinner 2-4 AM Overall  Glucose range:       Averages: 190   136    POST-MEAL PC Breakfast PC Lunch PC Dinner  Glucose range:     Averages: 249 281 243   Previously:  CGM use % of time 97  2-week average/GV 210  Time in range      35  %  % Time Above 180 35  % Time above 250 29  % Time Below 70 1     Dietician visit, most recent: 11/18  Weight history:  Wt Readings from Last 3 Encounters:  06/10/23 120 lb 9.6 oz (54.7 kg)  03/27/23 123 lb (55.8 kg)  03/11/23 123 lb (55.8 kg)    Glycemic control:   Lab Results  Component Value Date    HGBA1C 8.3 (A) 06/10/2023   HGBA1C 9.0 (A) 03/11/2023   HGBA1C 9.5 (H) 11/21/2022   Lab Results  Component Value Date   MICROALBUR 5.2 (H) 03/11/2023   LDLCALC 74 03/27/2023   CREATININE 1.11 (H) 03/27/2023   Lab Results  Component Value Date   MICRALBCREAT 5.4 03/11/2023    No results found for: "FRUCTOSAMINE"  Office Visit on 06/10/2023  Component Date Value Ref Range Status   Hemoglobin A1C 06/10/2023 8.3 (A)  4.0 - 5.6 % Final    Allergies as of 06/10/2023   No Known Allergies      Medication List        Accurate as of June 10, 2023  8:59 PM. If you have any questions, ask your nurse or doctor.          chlorpheniramine 4 MG tablet Commonly known as: CHLOR-TRIMETON Take one tablet by mouth once daily for 3 days, then as needed, for uncontrolled allergies and ear pressure   diazepam 10 MG tablet Commonly known as: VALIUM as needed.   Durezol 0.05 % Emul Generic drug: Difluprednate Apply 1 drop to eye daily at 8 pm.   Fish Oil 1000 MG Caps Take 1 capsule by mouth daily.   fluticasone 50 MCG/ACT nasal spray Commonly known as: FLONASE USE TWO SPRAYS IN BOTH NOSTRILS DAILY   FreeStyle Libre 3 Reader Devi 1 Device by Does not apply route continuous.   FreeStyle Libre 3 Reader Devi 1 Device by Does not apply route once for 1 dose.   FreeStyle Libre 3 Sensor Misc Place 1 sensor on the skin every 14 days. Use to check glucose continuously   Lantus SoloStar 100 UNIT/ML Solostar Pen Generic drug: insulin glargine 50 units a.m. and 15 units at bedtime   lisinopril 2.5 MG tablet Commonly known as: ZESTRIL TAKE ONE (1) TABLET BY MOUTH EVERY DAY   metoCLOPramide 5 MG tablet Commonly known as: REGLAN TAKE 1 TO 2 TABLETS BY MOUTH EVERY 6 HOURS AS NEEDED FOR NAUSEA OR VOMITING.   montelukast 10 MG tablet Commonly known as: SINGULAIR TAKE ONE TABLET (10MG  TOTAL) BY MOUTH ATBEDTIME   NovoLOG FlexPen 100 UNIT/ML FlexPen Generic drug: insulin  aspart 16-18 units  at meals   ondansetron 4 MG tablet Commonly known as: ZOFRAN TAKE ONE TABLET BY MOUTH EVERY EIGHT HOURS AS NEEDED FOR NAUSEA OR VOMITING  OneTouch Delica Lancets 33G Misc USE FOUR TIMES A DAY AS DIRECTED.   OneTouch Verio Flex System w/Device Kit USE TO TEST BLOOD SUGAR TWO TIMES A DAY   OneTouch Verio test strip Generic drug: glucose blood USE AS BACKUP FOR SENSOR, ONCE A DAY AS NEEDED   pantoprazole 40 MG tablet Commonly known as: PROTONIX TAKE ONE TABLET (40MG  TOTAL) BY MOUTH TWO TIMES DAILY BEFORE A MEAL ONCE SYMPTOMS CONTROLLED, CUT BACK TO ONCE DAILY IF TOLERATED   rosuvastatin 40 MG tablet Commonly known as: CRESTOR TAKE ONE (1) TABLET BY MOUTH EVERY DAY   sodium chloride 0.65 % nasal spray Commonly known as: OCEAN Place 1 spray into the nose as needed for congestion.   Sure Comfort Pen Needles 30G X 8 MM Misc Generic drug: Insulin Pen Needle USE FIVE TIMES A DAY   vitamin B-12 100 MCG tablet Commonly known as: CYANOCOBALAMIN Take 1 tablet by mouth daily.   VITAMIN D PO Take 1 Dose by mouth daily.        Allergies: No Known Allergies  Past Medical History:  Diagnosis Date   Anxiety    Dyslipidemia    Gastroparesis due to DM (HCC) OCT 2014   75% AT 2 HRS, GLU >    H/O eye surgery 01/2018   Headache(784.0)    IDDM (insulin dependent diabetes mellitus)    Neuropathy    Nicotine addiction    TIA (transient ischemic attack)    Dr. Gerilyn Pilgrim    Past Surgical History:  Procedure Laterality Date   Cataract surgery Bilateral 01/20/2017, 02/03/2017   COLONOSCOPY N/A 05/09/2016   Dr. Darrick Penna: 2 hyperplastic polyps removed.  Next colonoscopy in 2027   ESOPHAGOGASTRODUODENOSCOPY N/A 08/31/2013   SLF: 1. Earky satiety nausea may be due to Gastroparesis/pyloric channel stenosis. 2. small hiatal hernia 3. Moderate erosive gastritis.    EYE SURGERY  01/2018   laser surgery bilateral eye      Family History  Problem Relation Age of Onset    Hypertension Mother    Diabetes Mother    Hyperlipidemia Mother    Rosacea Mother    Hypertension Father    Diabetes Brother    Breast cancer Maternal Aunt    Colon cancer Neg Hx     Social History:  reports that she quit smoking about 11 years ago. Her smoking use included cigarettes. She started smoking about 35 years ago. She has a 36 pack-year smoking history. She has never used smokeless tobacco. She reports that she does not drink alcohol and does not use drugs.   Review of Systems   Lipid history: Followed by PCP, currently on Crestor 40 mg Labs as follows She has aortic atherosclerosis on  CT scan   Lab Results  Component Value Date   CHOL 166 03/27/2023   HDL 81 03/27/2023   LDLCALC 74 03/27/2023   TRIG 55 03/27/2023   CHOLHDL 2.0 03/27/2023           Blood pressure has been variable but not high  BP Readings from Last 3 Encounters:  06/10/23 120/70  04/29/23 110/76  03/27/23 99/63    Most recent eye exam was in 1/24  Most recent foot exam: 1/24  GASTROPARESIS: Has symptoms of fullness and nausea, will take Zofran as needed  Gastric emptying study showed significant abnormality in 2014 Followed by gastroenterologist  She is on Reglan before meals, taking 5 mg tid and occasionally may take an extra pill for nausea No episodes of vomiting but  occasionally may have fullness  Currently known complications of diabetes: Gastroparesis, neuropathy, retinopathy, no nephropathy  LABS:  Office Visit on 06/10/2023  Component Date Value Ref Range Status   Hemoglobin A1C 06/10/2023 8.3 (A)  4.0 - 5.6 % Final    Physical Examination:  BP 120/70   Pulse 79   Ht 5\' 7"  (1.702 m)   Wt 120 lb 9.6 oz (54.7 kg)   LMP 07/19/2013   SpO2 98%   BMI 18.89 kg/m        ASSESSMENT:  Diabetes type 1, poorly controlled with multiple complications  See history of present illness for detailed discussion of current diabetes management, blood sugar patterns from her  freestyle libre CGM download and problems identified  Her A1c is consistently over 8% and now 8.3  She is on basal bolus insulin with Toujeo twice a day and mealtime NovoLog  She has made significant changes in her insulin doses and also diet since her last visit Overall also more active As above her blood sugars are still quite variable but somewhat better in target range Also has had less extreme hyperglycemia and less hypoglycemia As before she is a good candidate for an insulin pump but she is not wanting to do this  PLAN:   She will need to adjust her mealtime doses based on portions and carbohydrates Continue to avoid high-fat meals May add extra NovoLog for high preprandial readings but reduce the dose if planning to be more active Increase the morning Toujeo to 54 to improve her afternoon readings If she started getting low sugars early morning she can reduce evening Toujeo again  Continue Reglan as above  There are no Patient Instructions on file for this visit.  Total visit time including counseling = 30 minutes  Ashley Armstrong 06/10/2023, 8:59 PM   Note: This office note was prepared with Dragon voice recognition system technology. Any transcriptional errors that result from this process are unintentional.

## 2023-06-10 NOTE — Progress Notes (Unsigned)
Subjective:  Patient ID: Ashley Armstrong, female    DOB: 1966/05/04,  MRN: [id number]   Ashley Armstrong presents to clinic today for:  Chief Complaint  Patient presents with   Nail Problem    Pt came in today for a routine foot care but she also mentioned heel pain in her right foot that she is been having for a couple months.   Patient notes nails are thick, discolored, elongated and painful in shoegear when trying to ambulate.  Patient notes she has a crack in the skin on the posterior aspect of the right heel.  She thinks that it got struck against something and tore.  She does have some pain in the area.  However, she also has some pain in the plantar heel on the same foot.  She does have a history of previous plantar fasciitis.  She notes that she is a type I diabetic  PCP is Kerri Perches, MD. date last seen is 03/27/2023  No Known Allergies  Review of Systems: Negative except as noted in the HPI.  Objective:  There were no vitals filed for this visit.  Ashley Armstrong is a pleasant 57 y.o. female in NAD. AAO x 3.  Vascular Examination: Capillary refill time is 3-5 seconds to toes bilateral. Palpable pedal pulses b/l LE. Digital hair present b/l. No pedal edema b/l. Skin temperature gradient WNL b/l. No varicosities b/l. No cyanosis or clubbing noted b/l.   Dermatological Examination: Pedal skin with normal turgor, texture and tone b/l. No open wounds. No interdigital macerations b/l. Toenails x10 are 3mm thick, discolored, dystrophic with subungual debris. There is pain with compression of the nail plates.  They are elongated x10.  There is a 4 mm superficial crack in the skin on the posterior plantar aspect of the right heel.  There is dried blood present.  There is no erythema in the fissure is not beyond dermis.  Neurological Examination: Protective sensation intact bilateral LE. Vibratory sensation diminished bilateral LE.  Musculoskeletal Examination: Muscle strength  5/5 to all LE muscle groups b/l.  Pain on palpation plantar aspect of right heel.     Latest Ref Rng & Units 06/10/2023   10:49 AM 03/11/2023    9:47 AM 11/21/2022    3:34 PM 07/02/2022   11:14 AM  Hemoglobin A1C  Hemoglobin-A1c 4.0 - 5.6 % 8.3  9.0  9.5  8.5    Assessment/Plan: 1. Plantar fasciitis of right foot   2. Pain due to onychomycosis of toenails of both feet   3. Diabetic peripheral neuropathy associated with type 1 diabetes mellitus (HCC)   4. Fissure in skin of foot    Patient was fitted for power steps and her appropriate shoe size.  Instructed patient on the break-in process. PR FT ARCH SUPRT PREMOLD LONGIT  The mycotic toenails were sharply debrided x10 with sterile nail nippers and a power debriding burr to decrease bulk/thickness and length.    Antibiotic ointment was massaged into the fissure on the posterior aspect of the right heel.  She will continue applying this every night until it has healed and resolved.  Return in about 3 months (around 09/10/2023) for [facility].   Clerance Lav, DPM, FACFAS [facility]     [address]  [place], [place] [address]                Office [phone]  Fax [phone]

## 2023-06-12 ENCOUNTER — Encounter: Payer: Self-pay | Admitting: "Endocrinology

## 2023-06-12 ENCOUNTER — Encounter: Payer: Self-pay | Admitting: Podiatry

## 2023-07-01 ENCOUNTER — Encounter: Payer: PPO | Admitting: Family Medicine

## 2023-07-02 ENCOUNTER — Other Ambulatory Visit: Payer: Self-pay | Admitting: Endocrinology

## 2023-08-06 ENCOUNTER — Ambulatory Visit (INDEPENDENT_AMBULATORY_CARE_PROVIDER_SITE_OTHER): Payer: PPO

## 2023-08-06 DIAGNOSIS — Z23 Encounter for immunization: Secondary | ICD-10-CM

## 2023-08-11 ENCOUNTER — Other Ambulatory Visit: Payer: Self-pay | Admitting: Gastroenterology

## 2023-08-21 ENCOUNTER — Telehealth: Payer: Self-pay | Admitting: Endocrinology

## 2023-08-21 ENCOUNTER — Other Ambulatory Visit: Payer: Self-pay

## 2023-08-21 DIAGNOSIS — E1165 Type 2 diabetes mellitus with hyperglycemia: Secondary | ICD-10-CM

## 2023-08-21 MED ORDER — FREESTYLE LIBRE 2 SENSOR MISC: 1.0000 | 3 refills | Status: DC

## 2023-08-21 NOTE — Telephone Encounter (Signed)
Ashley Armstrong is aware that the [place] 2 sensors have been sent to [facility]

## 2023-08-21 NOTE — Telephone Encounter (Signed)
Patient advising she only has 3 day worth Libre 3 sensor and she is asking if she can get a rx for Libra 2, she states the pharmacy hs that in stock. [facility]. Please advise when done

## 2023-08-25 DIAGNOSIS — F411 Generalized anxiety disorder: Secondary | ICD-10-CM | POA: Diagnosis not present

## 2023-09-08 ENCOUNTER — Encounter (HOSPITAL_COMMUNITY): Payer: Self-pay

## 2023-09-08 ENCOUNTER — Ambulatory Visit (HOSPITAL_COMMUNITY)
Admission: RE | Admit: 2023-09-08 | Discharge: 2023-09-08 | Disposition: A | Discharge status: Home / Self Care | Payer: PPO | Source: Ambulatory Visit | Attending: Family Medicine | Admitting: Family Medicine

## 2023-09-08 DIAGNOSIS — Z1231 Encounter for screening mammogram for malignant neoplasm of breast: Secondary | ICD-10-CM | POA: Diagnosis not present

## 2023-09-11 ENCOUNTER — Ambulatory Visit (INDEPENDENT_AMBULATORY_CARE_PROVIDER_SITE_OTHER): Payer: PPO | Admitting: Family Medicine

## 2023-09-11 ENCOUNTER — Encounter: Payer: Self-pay | Admitting: Family Medicine

## 2023-09-11 VITALS — BP 120/73 | HR 73 | Ht 67.0 in | Wt 122.1 lb

## 2023-09-11 DIAGNOSIS — Z794 Long term (current) use of insulin: Secondary | ICD-10-CM

## 2023-09-11 DIAGNOSIS — E785 Hyperlipidemia, unspecified: Secondary | ICD-10-CM

## 2023-09-11 DIAGNOSIS — J302 Other seasonal allergic rhinitis: Secondary | ICD-10-CM

## 2023-09-11 DIAGNOSIS — F418 Other specified anxiety disorders: Secondary | ICD-10-CM | POA: Diagnosis not present

## 2023-09-11 DIAGNOSIS — E1140 Type 2 diabetes mellitus with diabetic neuropathy, unspecified: Secondary | ICD-10-CM | POA: Diagnosis not present

## 2023-09-11 NOTE — Assessment & Plan Note (Signed)
Hyperlipidemia:Low fat diet discussed and encouraged.   Lipid Panel  Lab Results  Component Value Date   CHOL 166 03/27/2023   HDL 81 03/27/2023   LDLCALC 74 03/27/2023   TRIG 55 03/27/2023   CHOLHDL 2.0 03/27/2023     Updated lab needed at/ before next visit.

## 2023-09-11 NOTE — Patient Instructions (Signed)
F/U in 18 weeks, call if you need me sooner  Please submit collogued  Keep up great work  Fasting lipid, cmp and EGFR, HBA1C in the next 1 week  Thanks for choosing [facility], we consider it a privelige to serve you.

## 2023-09-11 NOTE — Assessment & Plan Note (Signed)
Uncontrolled , needs to comply with medication regime since currently experiencing a flare

## 2023-09-11 NOTE — Assessment & Plan Note (Signed)
Psychiatry every 3 to 4 months, diazepam at bedtime only Relying a lot on spirituality and this is working well for her No interest in therapy at this time

## 2023-09-11 NOTE — Progress Notes (Signed)
Ashley Armstrong     MRN: [id number]      DOB: 04/18/1966  Chief Complaint  Patient presents with   Follow-up    Allergies, headaches    HPI [name] is here for follow up and re-evaluation of chronic medical conditions, medication management and review of any available recent lab and radiology data.  Preventive health is updated, specifically  Cancer screening and Immunization.   Questions or concerns regarding consultations or procedures which the PT has had in the interim are  addressed. The PT denies any adverse reactions to current medications since the last visit.  C/o intermittent frontal headache and nasal congestion, no fever or  chills, drainage is clear. Not consistently using allergy medication Blood sugar continues to fluctuate, but feels it is doing better overall  ROS Denies recent fever or chills.  Denies chest congestion, productive cough or wheezing. Denies chest pains, palpitations and leg swelling Denies abdominal pain, nausea, vomiting,diarrhea or constipation.   Denies dysuria, frequency, hesitancy or incontinence. Denies joint pain, swelling and limitation in mobility. Denies headaches, seizures, numbness, or tingling. Denies uncontrolled depression,does have  anxiety mild  insomnia. Denies skin break down or rash.   PE  BP 120/73 (BP Location: Right Arm, Patient Position: Sitting, Cuff Size: Normal)   Pulse 73   Ht 5\' 7"  (1.702 m)   Wt 122 lb 1.9 oz (55.4 kg)   LMP 07/19/2013   SpO2 97%   BMI 19.13 kg/m   Patient alert and oriented and in no cardiopulmonary distress.  HEENT: No facial asymmetry, EOMI,     Neck supple .  Chest: Clear to auscultation bilaterally.  CVS: S1, S2 no murmurs, no S3.Regular rate.  ABD: Soft non tender.   Ext: No edema  MS: Adequate ROM spine, shoulders, hips and knees.  Skin: Intact, no ulcerations or rash noted.  Psych: Good eye contact, normal affect. Memory intact not anxious or depressed  appearing.  CNS: CN 2-12 intact, power,  normal throughout.no focal deficits noted.   Assessment & Plan  Depression with anxiety Psychiatry every 3 to 4 months, diazepam at bedtime only Relying a lot on spirituality and this is working well for her No interest in therapy at this time  Type 2 diabetes mellitus with diabetic neuropathy, unspecified (HCC) [name] is reminded of the importance of commitment to daily physical activity for 30 minutes or more, as able and the need to limit carbohydrate intake to 30 to 60 grams per meal to help with blood sugar control.   The need to take medication as prescribed, test blood sugar as directed, and to call between visits if there is a concern that blood sugar is uncontrolled is also discussed.   [name] is reminded of the importance of daily foot exam, annual eye examination, and good blood sugar, blood pressure and cholesterol control.     Latest Ref Rng & Units 06/10/2023   10:49 AM 03/27/2023   10:13 AM 03/11/2023   11:33 AM 03/11/2023    9:47 AM 11/21/2022    3:34 PM  Diabetic Labs  HbA1c 4.0 - 5.6 % 8.3    9.0  9.5   Microalbumin 0.0 - 1.9 mg/dL   5.2     Micro/Creat Ratio 0.0 - 30.0 mg/g   5.4     Chol 100 - 199 mg/dL  409    811   HDL >91 mg/dL  81    79   Calc LDL 0 -  99 mg/dL  74    70   Triglycerides 0 - 149 mg/dL  55    51   Creatinine 0.57 - 1.00 mg/dL  4.09    8.11       91/47/8295    1:03 PM 06/10/2023   10:39 AM 04/29/2023    1:06 PM 03/27/2023    9:22 AM 03/11/2023    9:39 AM 12/10/2022    1:14 PM 12/03/2022    2:28 PM  BP/Weight  Systolic BP 120 120 110 99 112 112 117  Diastolic BP 73 70 76 63 68 68 77  Wt. (Lbs) 122.12 120.6  123 123 121 120  BMI 19.13 kg/m2 18.89 kg/m2  19.26 kg/m2 19.26 kg/m2 18.95 kg/m2 18.79 kg/m2      Latest Ref Rng & Units 11/21/2022    2:20 PM 11/20/2022   12:00 AM  Foot/eye exam completion dates  Eye Exam No Retinopathy  Retinopathy      Foot Form Completion  Done      This result is  from an external source.      Updated lab needed at/ before next visit. Establishing with new Endo   Seasonal allergies Uncontrolled , needs to comply with medication regime since currently experiencing a flare  Hyperlipidemia LDL goal <100 Hyperlipidemia:Low fat diet discussed and encouraged.   Lipid Panel  Lab Results  Component Value Date   CHOL 166 03/27/2023   HDL 81 03/27/2023   LDLCALC 74 03/27/2023   TRIG 55 03/27/2023   CHOLHDL 2.0 03/27/2023     Updated lab needed at/ before next visit.

## 2023-09-11 NOTE — Assessment & Plan Note (Signed)
Ashley Armstrong is reminded of the importance of commitment to daily physical activity for 30 minutes or more, as able and the need to limit carbohydrate intake to 30 to 60 grams per meal to help with blood sugar control.   The need to take medication as prescribed, test blood sugar as directed, and to call between visits if there is a concern that blood sugar is uncontrolled is also discussed.   Ashley Armstrong is reminded of the importance of daily foot exam, annual eye examination, and good blood sugar, blood pressure and cholesterol control.     Latest Ref Rng & Units 06/10/2023   10:49 AM 03/27/2023   10:13 AM 03/11/2023   11:33 AM 03/11/2023    9:47 AM 11/21/2022    3:34 PM  Diabetic Labs  HbA1c 4.0 - 5.6 % 8.3    9.0  9.5   Microalbumin 0.0 - 1.9 mg/dL   5.2     Micro/Creat Ratio 0.0 - 30.0 mg/g   5.4     Chol 100 - 199 mg/dL  540    981   HDL >19 mg/dL  81    79   Calc LDL 0 - 99 mg/dL  74    70   Triglycerides 0 - 149 mg/dL  55    51   Creatinine 0.57 - 1.00 mg/dL  1.47    8.29       56/21/3086    1:03 PM 06/10/2023   10:39 AM 04/29/2023    1:06 PM 03/27/2023    9:22 AM 03/11/2023    9:39 AM 12/10/2022    1:14 PM 12/03/2022    2:28 PM  BP/Weight  Systolic BP 120 120 110 99 112 112 117  Diastolic BP 73 70 76 63 68 68 77  Wt. (Lbs) 122.12 120.6  123 123 121 120  BMI 19.13 kg/m2 18.89 kg/m2  19.26 kg/m2 19.26 kg/m2 18.95 kg/m2 18.79 kg/m2      Latest Ref Rng & Units 11/21/2022    2:20 PM 11/20/2022   12:00 AM  Foot/eye exam completion dates  Eye Exam No Retinopathy  Retinopathy      Foot Form Completion  Done      This result is from an external source.      Updated lab needed at/ before next visit. Establishing with new Endo

## 2023-09-15 ENCOUNTER — Encounter: Payer: Self-pay | Admitting: Podiatry

## 2023-09-15 ENCOUNTER — Ambulatory Visit (INDEPENDENT_AMBULATORY_CARE_PROVIDER_SITE_OTHER): Payer: PPO | Admitting: Podiatry

## 2023-09-15 VITALS — Ht 67.0 in | Wt 122.1 lb

## 2023-09-15 DIAGNOSIS — M79675 Pain in left toe(s): Secondary | ICD-10-CM | POA: Diagnosis not present

## 2023-09-15 DIAGNOSIS — B351 Tinea unguium: Secondary | ICD-10-CM

## 2023-09-15 DIAGNOSIS — M79674 Pain in right toe(s): Secondary | ICD-10-CM

## 2023-09-15 NOTE — Progress Notes (Signed)
       Subjective:  Patient ID: Ashley Armstrong, female    DOB: [birth year],  MRN: [id number]   MALVINA SCHADLER presents to clinic today for:  Chief Complaint  Patient presents with   Nail Problem    [facility]   Patient notes nails are thick, discolored, elongated and painful in shoegear when trying to ambulate.    PCP is Kerri Perches, MD. states she sees an endocrinologist for diabetes.  She has an appointment within the next 2 weeks.  Past Medical History:  Diagnosis Date   Anxiety    Dyslipidemia    Gastroparesis due to DM (HCC) OCT 2014   75% AT 2 HRS, GLU >    H/O eye surgery 01/2018   Headache(784.0)    IDDM (insulin dependent diabetes mellitus)    Neuropathy    Nicotine addiction    TIA (transient ischemic attack)    Dr. Gerilyn Pilgrim    Past Surgical History:  Procedure Laterality Date   BREAST BIOPSY Left 08/24/2013   benign fibrocystic changes with calcifications   Cataract surgery Bilateral 01/20/2017, 02/03/2017   COLONOSCOPY N/A 05/09/2016   Dr. Darrick Penna: 2 hyperplastic polyps removed.  Next colonoscopy in 2027   ESOPHAGOGASTRODUODENOSCOPY N/A 08/31/2013   SLF: 1. Earky satiety nausea may be due to Gastroparesis/pyloric channel stenosis. 2. small hiatal hernia 3. Moderate erosive gastritis.    EYE SURGERY  01/2018   laser surgery bilateral eye      No Known Allergies  Review of Systems: Negative except as noted in the HPI.  Objective:  KYSA CALAIS is a pleasant 57 y.o. female in NAD. AAO x 3.  Vascular Examination: Capillary refill time is 3-5 seconds to toes bilateral. Palpable pedal pulses b/l LE. Digital hair present b/l.  Skin temperature gradient WNL b/l. No varicosities b/l. No cyanosis noted b/l.   Dermatological Examination: Pedal skin with normal turgor, texture and tone b/l. No open wounds. No interdigital macerations b/l. Toenails x10 are 3mm thick, discolored, dystrophic with subungual debris. There is pain with compression of the nail plates.  They  are elongated x10     Latest Ref Rng & Units 06/10/2023   10:49 AM 03/11/2023    9:47 AM 11/21/2022    3:34 PM  Hemoglobin A1C  Hemoglobin-A1c 4.0 - 5.6 % 8.3  9.0  9.5     Assessment/Plan: 1. Pain due to onychomycosis of toenails of both feet    The mycotic toenails were sharply debrided x10 with sterile nail nippers and a power debriding burr to decrease bulk/thickness and length.    Follow-up in 3 months for diabetic footcare.  She may bring her mother to also be seen at that appointment.   Clerance Lav, DPM, FACFAS [facility]     [address] [place], [place] [address]                Office [phone]  Fax [phone]

## 2023-09-19 DIAGNOSIS — H31093 Other chorioretinal scars, bilateral: Secondary | ICD-10-CM | POA: Diagnosis not present

## 2023-09-19 DIAGNOSIS — E103512 Type 1 diabetes mellitus with proliferative diabetic retinopathy with macular edema, left eye: Secondary | ICD-10-CM | POA: Diagnosis not present

## 2023-09-19 DIAGNOSIS — H43391 Other vitreous opacities, right eye: Secondary | ICD-10-CM | POA: Diagnosis not present

## 2023-09-19 DIAGNOSIS — E103591 Type 1 diabetes mellitus with proliferative diabetic retinopathy without macular edema, right eye: Secondary | ICD-10-CM | POA: Diagnosis not present

## 2023-09-19 DIAGNOSIS — H4321 Crystalline deposits in vitreous body, right eye: Secondary | ICD-10-CM | POA: Diagnosis not present

## 2023-09-19 DIAGNOSIS — H35371 Puckering of macula, right eye: Secondary | ICD-10-CM | POA: Diagnosis not present

## 2023-09-19 DIAGNOSIS — H43821 Vitreomacular adhesion, right eye: Secondary | ICD-10-CM | POA: Diagnosis not present

## 2023-09-19 LAB — HM DIABETES EYE EXAM

## 2023-10-04 ENCOUNTER — Other Ambulatory Visit: Payer: Self-pay | Admitting: Gastroenterology

## 2023-10-16 DIAGNOSIS — E1140 Type 2 diabetes mellitus with diabetic neuropathy, unspecified: Secondary | ICD-10-CM | POA: Diagnosis not present

## 2023-10-16 DIAGNOSIS — E785 Hyperlipidemia, unspecified: Secondary | ICD-10-CM | POA: Diagnosis not present

## 2023-10-16 DIAGNOSIS — Z794 Long term (current) use of insulin: Secondary | ICD-10-CM | POA: Diagnosis not present

## 2023-10-17 LAB — CMP14+EGFR
ALT: 20 [IU]/L (ref 0–32)
AST: 22 [IU]/L (ref 0–40)
Albumin: 4.3 g/dL (ref 3.8–4.9)
Alkaline Phosphatase: 95 [IU]/L (ref 44–121)
BUN/Creatinine Ratio: 17 (ref 9–23)
BUN: 19 mg/dL (ref 6–24)
Bilirubin Total: 0.5 mg/dL (ref 0.0–1.2)
CO2: 22 mmol/L (ref 20–29)
Calcium: 9.1 mg/dL (ref 8.7–10.2)
Chloride: 103 mmol/L (ref 96–106)
Creatinine, Ser: 1.1 mg/dL — ABNORMAL HIGH (ref 0.57–1.00)
Globulin, Total: 2.3 g/dL (ref 1.5–4.5)
Glucose: 106 mg/dL — ABNORMAL HIGH (ref 70–99)
Potassium: 4.5 mmol/L (ref 3.5–5.2)
Sodium: 141 mmol/L (ref 134–144)
Total Protein: 6.6 g/dL (ref 6.0–8.5)
eGFR: 59 mL/min/{1.73_m2} — ABNORMAL LOW (ref >59)

## 2023-10-17 LAB — HEMOGLOBIN A1C
Est. average glucose Bld gHb Est-mCnc: 203 mg/dL
Hgb A1c MFr Bld: 8.7 % — ABNORMAL HIGH (ref 4.8–5.6)

## 2023-10-17 LAB — LIPID PANEL
Chol/HDL Ratio: 2.1 {ratio} (ref 0.0–4.4)
Cholesterol, Total: 174 mg/dL (ref 100–199)
HDL: 83 mg/dL (ref >39)
LDL Chol Calc (NIH): 77 mg/dL (ref 0–99)
Triglycerides: 76 mg/dL (ref 0–149)
VLDL Cholesterol Cal: 14 mg/dL (ref 5–40)

## 2023-10-23 ENCOUNTER — Encounter: Payer: Self-pay | Admitting: Endocrinology

## 2023-10-23 ENCOUNTER — Ambulatory Visit: Payer: PPO | Admitting: Endocrinology

## 2023-10-23 VITALS — BP 128/70 | HR 79 | Resp 20 | Ht 67.0 in | Wt 123.0 lb

## 2023-10-23 DIAGNOSIS — E103599 Type 1 diabetes mellitus with proliferative diabetic retinopathy without macular edema, unspecified eye: Secondary | ICD-10-CM

## 2023-10-23 DIAGNOSIS — E1065 Type 1 diabetes mellitus with hyperglycemia: Secondary | ICD-10-CM

## 2023-10-23 MED ORDER — NOVOLOG FLEXPEN 100 UNIT/ML ~~LOC~~ SOPN: PEN_INJECTOR | SUBCUTANEOUS | 3 refills | Status: DC

## 2023-10-23 MED ORDER — TOUJEO MAX SOLOSTAR 300 UNIT/ML ~~LOC~~ SOPN: PEN_INJECTOR | SUBCUTANEOUS | 4 refills | Status: DC

## 2023-10-23 MED ORDER — ONETOUCH VERIO VI STRP: ORAL_STRIP | 2 refills | Status: DC

## 2023-10-23 MED ORDER — GVOKE HYPOPEN 1-PACK 1 MG/0.2ML ~~LOC~~ SOAJ: 1.0000 mg | SUBCUTANEOUS | 2 refills | Status: DC | PRN

## 2023-10-23 MED ORDER — FREESTYLE LIBRE 3 PLUS SENSOR MISC: 1.0000 | 3 refills | Status: DC

## 2023-10-23 NOTE — Patient Instructions (Signed)
Stay on Toujeo 30 units and 15 units two times a day. Novolog 14 -18 units with meals 3 times a  day, adjust as instructed.

## 2023-10-23 NOTE — Progress Notes (Signed)
Outpatient Endocrinology Note Ashley Arran Fessel, MD  10/23/23  Patient's Name: Ashley Armstrong    DOB: 1966/01/26    MRN: [id number]                                                    REASON OF VISIT: Follow up for type 1 diabetes mellitus  PCP: Ashley Perches, MD  HISTORY OF PRESENT ILLNESS:   Ashley Armstrong is a 57 y.o. old female with past medical history listed below, is here for follow up for type 1 diabetes mellitus.   Pertinent Diabetes History: Patient was previously seen by Ashley Armstrong and was last time seen in July 2024.  Patient was diagnosed with type 1 diabetes mellitus at the age of 69.  She has uncontrolled type 1 diabetes mellitus with hemoglobin A1c mostly in the range of 8 to 10%.  Chronic Diabetes Complications : Retinopathy: Proliferative diabetic retinopathy bilaterally. Last ophthalmology exam was done on 09/2023, following with ophthalmology Ashley Armstrong regularly.  Nephropathy: no /lisinopril. Peripheral neuropathy: yes Coronary artery disease: no.  She has atherosclerosis on CT scan. Stroke: no  GASTROPARESIS: Has symptoms of fullness and nausea, will take Zofran as needed  Gastric emptying study showed significant abnormality in 2014 Followed by gastroenterologist  She is on Reglan before meals, taking 5 mg tid and occasionally may take an extra pill for nausea No episodes of vomiting but occasionally may have fullness    Relevant comorbidities and cardiovascular risk factors: Obesity: no Body mass index is 19.26 kg/m.  Hypertension: Yes  Hyperlipidemia : Yes, on statin   Current / Home Diabetic regimen includes:  INSULIN regimen is: TOUJEO 15-30 a.m. -15-30 units p.m.  NovoLog at mealtimes mostly 14-18 units  with meals.   Prior diabetic medications: Lantus in the past.  Glycemic data:    CONTINUOUS GLUCOSE MONITORING SYSTEM (CGMS) INTERPRETATION: At today's visit, we reviewed CGM downloads. The full report is scanned in the media.  Reviewing the CGM trends, blood glucose are as follows:  FreeStyle Libre 2 CGM-  Sensor Download (Sensor download was reviewed and summarized below.) Dates: November 29 to October 23, 2023, 14 days  Glucose Management Indicator: 8%   Interpretation: -Variable blood sugar with occasional hyperglycemia after blood sugar in 300s and occasional hypoglycemia mainly in the early morning and sometime in the afternoon with blood sugar in 50-60 range.  Other times blood sugar in between the meals and overnight are acceptable.  Hypoglycemia is related with meals postprandially and hypoglycemia seems to be related with relatively larger dose of basal insulin and variable timing of the meals and skipping meals.  Hypoglycemia: Patient has minor hypoglycemic episodes. Patient has hypoglycemia awareness.  Factors modifying glucose control: 1.  Diabetic diet assessment: 2-3 meals a day however different timing at variable times.  2.  Staying active or exercising: No formal exercise.  3.  Medication compliance: compliant most of the time.  Interval history  CGM data as reviewed above mostly variable blood sugar.  She reports she has been able timing of the eating sometimes she does not eat for prolonged hours.  She also has used Toujeo dosage as mentioned above her regular dose of 30 units 2 times a day however sometimes she just takes 15 units at a time generally every other day.  She takes less Toujeo  when blood sugar is low.  She reports compliance with NovoLog whenever she is eating.  REVIEW OF SYSTEMS As per history of present illness.   PAST MEDICAL HISTORY: Past Medical History:  Diagnosis Date   Anxiety    Dyslipidemia    Gastroparesis due to DM (HCC) OCT 2014   75% AT 2 HRS, GLU >    H/O eye surgery 01/2018   Headache(784.0)    IDDM (insulin dependent diabetes mellitus)    Neuropathy    Nicotine addiction    TIA (transient ischemic attack)    Ashley Armstrong    PAST SURGICAL  HISTORY: Past Surgical History:  Procedure Laterality Date   BREAST BIOPSY Left 08/24/2013   benign fibrocystic changes with calcifications   Cataract surgery Bilateral 01/20/2017, 02/03/2017   COLONOSCOPY N/A 05/09/2016   Ashley Armstrong: 2 hyperplastic polyps removed.  Next colonoscopy in 2027   ESOPHAGOGASTRODUODENOSCOPY N/A 08/31/2013   SLF: 1. Earky satiety nausea may be due to Gastroparesis/pyloric channel stenosis. 2. small hiatal hernia 3. Moderate erosive gastritis.    EYE SURGERY  01/2018   laser surgery bilateral eye      ALLERGIES: No Known Allergies  FAMILY HISTORY:  Family History  Problem Relation Age of Onset   Hypertension Mother    Diabetes Mother    Hyperlipidemia Mother    Rosacea Mother    Hypertension Father    Diabetes Brother    Breast cancer Maternal Aunt    Colon cancer Neg Hx     SOCIAL HISTORY: Social History   Socioeconomic History   Marital status: Single    Spouse name: Not on file   Number of children: Not on file   Years of education: Not on file   Highest education level: Not on file  Occupational History   Occupation: disabled  Tobacco Use   Smoking status: Former    Current packs/day: 0.00    Average packs/day: 1.5 packs/day for 24.0 years (36.0 ttl pk-yrs)    Types: Cigarettes    Start date: 08/24/1987    Quit date: 08/24/2011    Years since quitting: 12.1   Smokeless tobacco: Never  Vaping Use   Vaping status: Never Used  Substance and Sexual Activity   Alcohol use: No   Drug use: No   Sexual activity: Not Currently    Birth control/protection: None  Other Topics Concern   Not on file  Social History Narrative   Disabled, lives with a room mate   Social Drivers of Health   Financial Resource Strain: Low Risk  (04/24/2022)   Overall Financial Resource Strain (CARDIA)    Difficulty of Paying Living Expenses: Not hard at all  Food Insecurity: No Food Insecurity (04/24/2022)   Hunger Vital Sign    Worried About Running Out  of Food in the Last Year: Never true    Ran Out of Food in the Last Year: Never true  Transportation Needs: No Transportation Needs (04/24/2022)   PRAPARE - Administrator, Civil Service (Medical): No    Lack of Transportation (Non-Medical): No  Physical Activity: Insufficiently Active (04/29/2023)   Exercise Vital Sign    Days of Exercise per Week: 7 days    Minutes of Exercise per Session: 20 min  Stress: Stress Concern Present (04/24/2022)   [employer] of Occupational Health - Occupational Stress Questionnaire    Feeling of Stress : To some extent  Social Connections: Moderately Isolated (04/24/2022)   Social Connection and Isolation Panel [NHANES]  Frequency of Communication with Friends and Family: More than three times a week    Frequency of Social Gatherings with Friends and Family: More than three times a week    Attends Religious Services: More than 4 times per year    Active Member of [employer] or Organizations: No    Attends Banker Meetings: Never    Marital Status: Never married    MEDICATIONS:  Current Outpatient Medications  Medication Sig Dispense Refill   Continuous Glucose Sensor (FREESTYLE LIBRE 3 PLUS SENSOR) MISC 1 each by Does not apply route continuous. Change every 15 days. 6 each 3   Glucagon (GVOKE HYPOPEN 1-PACK) 1 MG/0.2ML SOAJ Inject 1 mg into the skin as needed (low blood sugar with impaired consciousness). 0.4 mL 2   Blood Glucose Monitoring Suppl (ONETOUCH VERIO FLEX SYSTEM) w/Device KIT USE TO TEST BLOOD SUGAR TWO TIMES A DAY 1 kit 0   chlorpheniramine (CHLOR-TRIMETON) 4 MG tablet Take one tablet by mouth once daily for 3 days, then as needed, for uncontrolled allergies and ear pressure 14 tablet 0   Continuous Blood Gluc Receiver (FREESTYLE LIBRE 3 READER) DEVI 1 Device by Does not apply route continuous. (Patient not taking: Reported on 10/23/2023) 1 each 0   Continuous Glucose Receiver (FREESTYLE LIBRE 3 READER) DEVI 1 Device  by Does not apply route once for 1 dose. 1 each 0   diazepam (VALIUM) 10 MG tablet as needed.      DUREZOL 0.05 % EMUL Apply 1 drop to eye daily at 8 pm.     fluticasone (FLONASE) 50 MCG/ACT nasal spray USE TWO SPRAYS IN BOTH NOSTRILS DAILY 16 g 6   glucose blood (ONETOUCH VERIO) test strip USE AS BACKUP FOR SENSOR, ONCE A DAY AS NEEDED 50 each 2   insulin aspart (NOVOLOG FLEXPEN) 100 UNIT/ML FlexPen 16-18 units  at meals 45 mL 3   insulin glargine, 2 Unit Dial, (TOUJEO MAX SOLOSTAR) 300 UNIT/ML Solostar Pen Take 30 units in the morning and 15 units at bedtime. 24 mL 4   lisinopril (ZESTRIL) 2.5 MG tablet TAKE ONE (1) TABLET BY MOUTH EVERY DAY 90 tablet 1   metoCLOPramide (REGLAN) 5 MG tablet TAKE 1 TO 2 TABLETS BY MOUTH EVERY 6 HOURS AS NEEDED FOR NAUSEA OR VOMITING. 120 tablet 3   montelukast (SINGULAIR) 10 MG tablet TAKE ONE TABLET (10MG  TOTAL) BY MOUTH ATBEDTIME 90 tablet 1   Omega-3 Fatty Acids (FISH OIL) 1000 MG CAPS Take 1 capsule by mouth daily.     ondansetron (ZOFRAN) 4 MG tablet TAKE ONE TABLET BY MOUTH EVERY EIGHT HOURS AS NEEDED FOR NAUSEA OR VOMITING 30 tablet 3   ONETOUCH DELICA LANCETS 33G MISC USE FOUR TIMES A DAY AS DIRECTED. 100 each 1   pantoprazole (PROTONIX) 40 MG tablet TAKE ONE TABLET (40MG  TOTAL) BY MOUTH TWO TIMES DAILY BEFORE A MEAL ONCE SYMPTOMS CONTROLLED, CUT BACK TO ONCE DAILY IF TOLERATED 180 tablet 1   rosuvastatin (CRESTOR) 40 MG tablet TAKE ONE (1) TABLET BY MOUTH EVERY DAY 90 tablet 3   sodium chloride (OCEAN) 0.65 % nasal spray Place 1 spray into the nose as needed for congestion. 30 mL 12   SURE COMFORT PEN NEEDLES 30G X 8 MM MISC USE FIVE TIMES A DAY 400 each 2   vitamin B-12 (CYANOCOBALAMIN) 100 MCG tablet Take 1 tablet by mouth daily.     VITAMIN D PO Take 1 Dose by mouth daily.     No current facility-administered medications  for this visit.    PHYSICAL EXAM: Vitals:   10/23/23 1437  BP: 128/70  Pulse: 79  Resp: 20  SpO2: 98%  Weight: 123 lb  (55.8 kg)  Height: 5\' 7"  (1.702 m)   Body mass index is 19.26 kg/m.  Wt Readings from Last 3 Encounters:  10/23/23 123 lb (55.8 kg)  09/15/23 122 lb 1.9 oz (55.4 kg)  09/11/23 122 lb 1.9 oz (55.4 kg)    General: Well developed, well nourished female in no apparent distress.  HEENT: AT/[place], no external lesions.  Eyes: Conjunctiva clear and no icterus. Neck: Neck supple  Lungs: Respirations not labored Neurologic: Alert, oriented, normal speech Extremities / Skin: Dry.  Psychiatric: Does not appear depressed or anxious  Diabetic Foot Exam - Simple   No data filed     LABS Reviewed Lab Results  Component Value Date   HGBA1C 8.7 (H) 10/16/2023   HGBA1C 8.3 (A) 06/10/2023   HGBA1C 9.0 (A) 03/11/2023   No results found for: "FRUCTOSAMINE" Lab Results  Component Value Date   CHOL 174 10/16/2023   HDL 83 10/16/2023   LDLCALC 77 10/16/2023   TRIG 76 10/16/2023   CHOLHDL 2.1 10/16/2023   Lab Results  Component Value Date   MICRALBCREAT 5.4 03/11/2023   MICRALBCREAT 8.1 02/06/2022   Lab Results  Component Value Date   CREATININE 1.10 (H) 10/16/2023   Lab Results  Component Value Date   GFR 61.24 07/10/2021    ASSESSMENT / PLAN  1. Uncontrolled type 1 diabetes mellitus with hyperglycemia (HCC)   2. Proliferative diabetic retinopathy associated with type 1 diabetes mellitus, unspecified laterality, unspecified proliferative retinopathy type (HCC)     Diabetes Mellitus type 1, complicated by diabetic retinopathy/neuropathy/gastroparesis. - Diabetic status / severity: uncontrolled.  Lab Results  Component Value Date   HGBA1C 8.7 (H) 10/16/2023    - Hemoglobin A1c goal : <7%  Discussed about importance of diabetes control to avoid chronic complications.  She has variable blood sugar with hypo and hyperglycemia.   Discussed that insulin pump therapy would be the best option for her.  She does not want to be on insulin pump.  I would like to decrease the dose of  basal insulin to decrease fasting and in between the meal hypoglycemia.  - Medications: See below.  I) stay on Toujeo 30 units in the morning and 15 units at bedtime. II) adjust NovoLog 14 to 18 units with meals 3 times a day and adjust based on experience/meal size and carbohydrate content.  - Home glucose testing: Freestyle libre 3+ and check as needed.  Test supplies and CGM prescription sent. - Discussed/ Gave Hypoglycemia treatment plan.  Advised to use glucose tablet to correct hypoglycemia.  Glucagon Emergency Kit prescribed.  # Consult : not required at this time.   # Annual urine for microalbuminuria/ creatinine ratio, no microalbuminuria currently, continue ACE/ARB /lisinopril. Last  Lab Results  Component Value Date   MICRALBCREAT 5.4 03/11/2023    # Foot check nightly / neuropathy.  Gastroparesis she has been on Reglan.  # She has proliferative diabetic retinopathy, following with retina Armstrong/ophthalmology..   - Diet: Make healthy diabetic food choices.  Advised not to miss/skip meals for prolonged hours. - Life style / activity / exercise: Discussed.  2. Blood pressure  -  BP Readings from Last 1 Encounters:  10/23/23 128/70    - Control is in target.  - No change in current plans.  3. Lipid status / Hyperlipidemia -  Last  Lab Results  Component Value Date   LDLCALC 77 10/16/2023   - Continue rosuvastatin 40 mg daily.  Diagnoses and all orders for this visit:  Uncontrolled type 1 diabetes mellitus with hyperglycemia (HCC) -     Continuous Glucose Sensor (FREESTYLE LIBRE 3 PLUS SENSOR) MISC; 1 each by Does not apply route continuous. Change every 15 days. -     Glucagon (GVOKE HYPOPEN 1-PACK) 1 MG/0.2ML SOAJ; Inject 1 mg into the skin as needed (low blood sugar with impaired consciousness). -     insulin aspart (NOVOLOG FLEXPEN) 100 UNIT/ML FlexPen; 16-18 units  at meals -     insulin glargine, 2 Unit Dial, (TOUJEO MAX SOLOSTAR) 300 UNIT/ML Solostar  Pen; Take 30 units in the morning and 15 units at bedtime.  Proliferative diabetic retinopathy associated with type 1 diabetes mellitus, unspecified laterality, unspecified proliferative retinopathy type (HCC) -     glucose blood (ONETOUCH VERIO) test strip; USE AS BACKUP FOR SENSOR, ONCE A DAY AS NEEDED    DISPOSITION Follow up in clinic in 3  months suggested.  Asked to call our clinic in between visits with any concern about glucose.   All questions answered and patient verbalized understanding of the plan.  Ashley Danialle Dement, MD [facility] Endocrinology [facility] [address] [place], [address] [place], [place] [address] Phone # [phone]  At least part of this note was generated using voice recognition software. Inadvertent word errors may have occurred, which were not recognized during the proofreading process.

## 2023-11-14 ENCOUNTER — Other Ambulatory Visit: Payer: Self-pay | Admitting: Family Medicine

## 2023-12-16 ENCOUNTER — Ambulatory Visit (INDEPENDENT_AMBULATORY_CARE_PROVIDER_SITE_OTHER): Payer: PPO | Admitting: Podiatry

## 2023-12-16 DIAGNOSIS — B351 Tinea unguium: Secondary | ICD-10-CM

## 2023-12-16 DIAGNOSIS — M79674 Pain in right toe(s): Secondary | ICD-10-CM

## 2023-12-16 DIAGNOSIS — M79675 Pain in left toe(s): Secondary | ICD-10-CM | POA: Diagnosis not present

## 2023-12-16 NOTE — Progress Notes (Signed)
       Subjective:  Patient ID: Ashley Armstrong, female    DOB: [birth year],  MRN: [id number]   Ashley Armstrong presents to clinic today for:  Chief Complaint  Patient presents with   [facility]    Last A1c: 8.3.no anticoag. Needs nails trimmed. No callus.    Patient notes nails are thick, discolored, elongated and painful in shoegear when trying to ambulate.  States she likes her cream for the calluses.  PCP is Antonetta Rollene BRAVO, MD.  Past Medical History:  Diagnosis Date   Anxiety    Dyslipidemia    Gastroparesis due to DM [facility]) OCT 2014   75% AT 2 HRS, GLU >    H/O eye surgery 01/2018   Headache(784.0)    IDDM (insulin  dependent diabetes mellitus)    Neuropathy    Nicotine addiction    TIA (transient ischemic attack)    Dr. Milton    Past Surgical History:  Procedure Laterality Date   BREAST BIOPSY Left 08/24/2013   benign fibrocystic changes with calcifications   Cataract surgery Bilateral 01/20/2017, 02/03/2017   COLONOSCOPY N/A 05/09/2016   Dr. Harvey: 2 hyperplastic polyps removed.  Next colonoscopy in 2027   ESOPHAGOGASTRODUODENOSCOPY N/A 08/31/2013   SLF: 1. Earky satiety nausea may be due to Gastroparesis/pyloric channel stenosis. 2. small hiatal hernia 3. Moderate erosive gastritis.    EYE SURGERY  01/2018   laser surgery bilateral eye      No Known Allergies  Review of Systems: Negative except as noted in the HPI.  Objective:  Ashley Armstrong is a pleasant 58 y.o. female in NAD. AAO x 3.  Vascular Examination: Capillary refill time is 3-5 seconds to toes bilateral. Palpable pedal pulses b/l LE. Digital hair present b/l.  Skin temperature gradient WNL b/l. No varicosities b/l. No cyanosis noted b/l.   Dermatological Examination: Pedal skin with normal turgor, texture and tone b/l. No open wounds. No interdigital macerations b/l. Toenails x10 are 3mm thick, discolored, dystrophic with subungual debris. There is pain with compression of the nail plates.  They are  elongated x10     Latest Ref Rng & Units 10/16/2023    8:16 AM 06/10/2023   10:49 AM 03/11/2023    9:47 AM  Hemoglobin A1C  Hemoglobin-A1c 4.8 - 5.6 % 8.7  8.3  9.0    Assessment/Plan: 1. Pain due to onychomycosis of toenails of both feet    The mycotic toenails were sharply debrided x10 with sterile nail nippers and a power debriding burr to decrease bulk/thickness and length.    Return in about 3 months (around 03/14/2024) for Ashley Armstrong.   Ashley Armstrong, DPM, FACFAS [facility]     [address] [place], [place] [address]                Office [phone]  Fax [phone]

## 2024-01-08 DIAGNOSIS — F411 Generalized anxiety disorder: Secondary | ICD-10-CM | POA: Diagnosis not present

## 2024-01-15 ENCOUNTER — Ambulatory Visit (INDEPENDENT_AMBULATORY_CARE_PROVIDER_SITE_OTHER): Payer: PPO | Admitting: Family Medicine

## 2024-01-15 ENCOUNTER — Encounter: Payer: Self-pay | Admitting: Family Medicine

## 2024-01-15 VITALS — BP 109/73 | HR 92 | Ht 67.0 in | Wt 123.5 lb

## 2024-01-15 DIAGNOSIS — Z1231 Encounter for screening mammogram for malignant neoplasm of breast: Secondary | ICD-10-CM

## 2024-01-15 DIAGNOSIS — F411 Generalized anxiety disorder: Secondary | ICD-10-CM | POA: Diagnosis not present

## 2024-01-15 DIAGNOSIS — E1140 Type 2 diabetes mellitus with diabetic neuropathy, unspecified: Secondary | ICD-10-CM | POA: Diagnosis not present

## 2024-01-15 DIAGNOSIS — Z0189 Encounter for other specified special examinations: Secondary | ICD-10-CM

## 2024-01-15 DIAGNOSIS — Z794 Long term (current) use of insulin: Secondary | ICD-10-CM

## 2024-01-15 DIAGNOSIS — E559 Vitamin D deficiency, unspecified: Secondary | ICD-10-CM | POA: Diagnosis not present

## 2024-01-15 DIAGNOSIS — F322 Major depressive disorder, single episode, severe without psychotic features: Secondary | ICD-10-CM

## 2024-01-15 DIAGNOSIS — E785 Hyperlipidemia, unspecified: Secondary | ICD-10-CM | POA: Diagnosis not present

## 2024-01-15 DIAGNOSIS — R21 Rash and other nonspecific skin eruption: Secondary | ICD-10-CM | POA: Diagnosis not present

## 2024-01-15 NOTE — Patient Instructions (Addendum)
 Annual exam mid to end June, call if you need me sooner  Fasting labs including microalb 2 week before June visit  You are being referred to Dermatology re rash on right  ankle for past 12 months  Pls schedule appt with GI for gastroparesis  Please sched 10/29 or after mammogram at checkout  It is important that you exercise regularly at least 30 minutes 5 times a week. If you develop chest pain, have severe difficulty breathing, or feel very tired, stop exercising immediately and seek medical attention   Thanks for choosing [facility], we consider it a privelige to serve you.

## 2024-01-22 ENCOUNTER — Ambulatory Visit: Payer: PPO | Admitting: Endocrinology

## 2024-01-22 ENCOUNTER — Encounter: Payer: Self-pay | Admitting: Endocrinology

## 2024-01-22 VITALS — BP 110/64 | HR 78 | Resp 20 | Ht 67.0 in | Wt 125.4 lb

## 2024-01-22 DIAGNOSIS — E1065 Type 1 diabetes mellitus with hyperglycemia: Secondary | ICD-10-CM | POA: Diagnosis not present

## 2024-01-22 LAB — POCT GLYCOSYLATED HEMOGLOBIN (HGB A1C): Hemoglobin A1C: 8.3 % — AB (ref 4.0–5.6)

## 2024-01-22 MED ORDER — LANCETS MISC. MISC: 1.0000 | Freq: Three times a day (TID) | 3 refills | Status: AC

## 2024-01-22 MED ORDER — BLOOD GLUCOSE MONITORING SUPPL DEVI: 1.0000 | Freq: Three times a day (TID) | 0 refills | Status: AC

## 2024-01-22 MED ORDER — LANCET DEVICE MISC: 1.0000 | Freq: Three times a day (TID) | 0 refills | Status: AC

## 2024-01-22 MED ORDER — BLOOD GLUCOSE TEST VI STRP: 1.0000 | ORAL_STRIP | Freq: Three times a day (TID) | 3 refills | Status: DC

## 2024-01-22 NOTE — Progress Notes (Signed)
 Outpatient Endocrinology Note Ashley Aldeen Riga, MD  01/22/24  Patient's Name: Ashley Armstrong    DOB: October 06, 1966    MRN: [id number]                                                    REASON OF VISIT: Follow up for type 1 diabetes mellitus  PCP: Ashley Perches, MD  HISTORY OF PRESENT ILLNESS:   Ashley Armstrong is a 58 y.o. old female with past medical history listed below, is here for follow up for type 1 diabetes mellitus.   Pertinent Diabetes History: Patient was previously seen by Dr. Lucianne Armstrong and was last time seen in July 2024.  Patient was diagnosed with type 1 diabetes mellitus at the age of 88.  She has uncontrolled type 1 diabetes mellitus with hemoglobin A1c mostly in the range of 8 to 10%.  Chronic Diabetes Complications : Retinopathy: Proliferative diabetic retinopathy bilaterally. Last ophthalmology exam was done on 09/2023, following with ophthalmology / retina specialist regularly.  Nephropathy: no /lisinopril. Peripheral neuropathy: yes Coronary artery disease: no.  She has atherosclerosis on CT scan. Stroke: no  GASTROPARESIS: Has symptoms of fullness and nausea, will take Zofran as needed  Gastric emptying study showed significant abnormality in 2014 Followed by gastroenterologist  She is on Reglan before meals, taking 5 mg tid and occasionally may take an extra pill for nausea No episodes of vomiting but occasionally may have fullness    Relevant comorbidities and cardiovascular risk factors: Obesity: no Body mass index is 19.64 kg/m.  Hypertension: Yes  Hyperlipidemia : Yes, on statin   Current / Home Diabetic regimen includes:  INSULIN regimen is: TOUJEO 30 units a.m. -15 units p.m.  NovoLog at mealtimes mostly 12-14 units  with meals.   Prior diabetic medications: Lantus in the past.  Glycemic data:    CONTINUOUS GLUCOSE MONITORING SYSTEM (CGMS) INTERPRETATION: At today's visit, we reviewed CGM downloads. The full report is scanned in the media.  Reviewing the CGM trends, blood glucose are as follows:  FreeStyle Libre 2 CGM-  Sensor Download (Sensor download was reviewed and summarized below.) Dates: February 28 to January 22, 2024, 14 days  Glucose Management Indicator: 8.1%    Interpretation: Random postprandial hyperglycemia with blood sugar 250-300 range with meals breakfast, lunch and supper.  Some of the times acceptable blood sugar, rare mild hypoglycemia with blood sugar in 60s after use of mealtime insulin.  Blood sugar in between the meals are acceptable.  No concerning hypoglycemia.  Hypoglycemia: Patient has minor hypoglycemic episodes. Patient has hypoglycemia awareness.  Factors modifying glucose control: 1.  Diabetic diet assessment: 2-3 meals a day however different timing at variable times.  2.  Staying active or exercising: No formal exercise.  3.  Medication compliance: compliant most of the time.  Interval history  CGM data as reviewed above.  Random postprandial hyperglycemia.  Hemoglobin A1c improved to 8.3%, GMI on CGM 8.1%.  She reports compliance with insulin.  She has noticed prolonged hyperglycemia related to her gastroparesis and takes extra NovoLog for correcting hyperglycemia after meals.  Mostly she tries to take NovoLog prior to eating.  No other complaints today.  REVIEW OF SYSTEMS As per history of present illness.   PAST MEDICAL HISTORY: Past Medical History:  Diagnosis Date   Anxiety    Dyslipidemia  Gastroparesis due to DM (HCC) OCT 2014   75% AT 2 HRS, GLU >    H/O eye surgery 01/2018   Headache(784.0)    IDDM (insulin dependent diabetes mellitus)    Neuropathy    Nicotine addiction    TIA (transient ischemic attack)    Dr. Gerilyn Armstrong    PAST SURGICAL HISTORY: Past Surgical History:  Procedure Laterality Date   BREAST BIOPSY Left 08/24/2013   benign fibrocystic changes with calcifications   Cataract surgery Bilateral 01/20/2017, 02/03/2017   COLONOSCOPY N/A 05/09/2016   Dr.  Darrick Armstrong: 2 hyperplastic polyps removed.  Next colonoscopy in 2027   ESOPHAGOGASTRODUODENOSCOPY N/A 08/31/2013   SLF: 1. Earky satiety nausea may be due to Gastroparesis/pyloric channel stenosis. 2. small hiatal hernia 3. Moderate erosive gastritis.    EYE SURGERY  01/2018   laser surgery bilateral eye      ALLERGIES: No Known Allergies  FAMILY HISTORY:  Family History  Problem Relation Age of Onset   Hypertension Mother    Diabetes Mother    Hyperlipidemia Mother    Rosacea Mother    Hypertension Father    Diabetes Brother    Breast cancer Maternal Aunt    Colon cancer Neg Hx     SOCIAL HISTORY: Social History   Socioeconomic History   Marital status: Single    Spouse name: Not on file   Number of children: Not on file   Years of education: Not on file   Highest education level: Not on file  Occupational History   Occupation: disabled  Tobacco Use   Smoking status: Former    Current packs/day: 0.00    Average packs/day: 1.5 packs/day for 24.0 years (36.0 ttl pk-yrs)    Types: Cigarettes    Start date: 08/24/1987    Quit date: 08/24/2011    Years since quitting: 12.4   Smokeless tobacco: Never  Vaping Use   Vaping status: Never Used  Substance and Sexual Activity   Alcohol use: No   Drug use: No   Sexual activity: Not Currently    Birth control/protection: None  Other Topics Concern   Not on file  Social History Narrative   Disabled, lives with a room mate   Social Drivers of Health   Financial Resource Strain: Low Risk  (04/24/2022)   Overall Financial Resource Strain (CARDIA)    Difficulty of Paying Living Expenses: Not hard at all  Food Insecurity: No Food Insecurity (04/24/2022)   Hunger Vital Sign    Worried About Running Out of Food in the Last Year: Never true    Ran Out of Food in the Last Year: Never true  Transportation Needs: No Transportation Needs (04/24/2022)   PRAPARE - Administrator, Civil Service (Medical): No    Lack of  Transportation (Non-Medical): No  Physical Activity: Insufficiently Active (04/29/2023)   Exercise Vital Sign    Days of Exercise per Week: 7 days    Minutes of Exercise per Session: 20 min  Stress: Stress Concern Present (04/24/2022)   [employer] of Occupational Health - Occupational Stress Questionnaire    Feeling of Stress : To some extent  Social Connections: Moderately Isolated (04/24/2022)   Social Connection and Isolation Panel [NHANES]    Frequency of Communication with Friends and Family: More than three times a week    Frequency of Social Gatherings with Friends and Family: More than three times a week    Attends Religious Services: More than 4 times per year  Active Member of Clubs or Organizations: No    Attends Banker Meetings: Never    Marital Status: Never married    MEDICATIONS:  Current Outpatient Medications  Medication Sig Dispense Refill   Blood Glucose Monitoring Suppl (ONETOUCH VERIO FLEX SYSTEM) w/Device KIT USE TO TEST BLOOD SUGAR TWO TIMES A DAY 1 kit 0   Blood Glucose Monitoring Suppl DEVI 1 each by Does not apply route in the morning, at noon, and at bedtime. May substitute to any manufacturer covered by patient's insurance. 1 each 0   chlorpheniramine (CHLOR-TRIMETON) 4 MG tablet Take one tablet by mouth once daily for 3 days, then as needed, for uncontrolled allergies and ear pressure 14 tablet 0   Continuous Blood Gluc Receiver (FREESTYLE LIBRE 3 READER) DEVI 1 Device by Does not apply route continuous. 1 each 0   Continuous Glucose Sensor (FREESTYLE LIBRE 3 PLUS SENSOR) MISC 1 each by Does not apply route continuous. Change every 15 days. 6 each 3   diazepam (VALIUM) 10 MG tablet as needed.      DUREZOL 0.05 % EMUL Apply 1 drop to eye daily at 8 pm.     fluticasone (FLONASE) 50 MCG/ACT nasal spray USE TWO SPRAYS IN BOTH NOSTRILS DAILY 16 g 6   Glucagon (GVOKE HYPOPEN 1-PACK) 1 MG/0.2ML SOAJ Inject 1 mg into the skin as needed (low blood  sugar with impaired consciousness). 0.4 mL 2   Glucose Blood (BLOOD GLUCOSE TEST STRIPS) STRP 1 each by In Vitro route in the morning, at noon, and at bedtime. May substitute to any manufacturer covered by patient's insurance. 100 each 3   glucose blood (ONETOUCH VERIO) test strip USE AS BACKUP FOR SENSOR, ONCE A DAY AS NEEDED 50 each 2   insulin aspart (NOVOLOG FLEXPEN) 100 UNIT/ML FlexPen 16-18 units  at meals 45 mL 3   insulin glargine, 2 Unit Dial, (TOUJEO MAX SOLOSTAR) 300 UNIT/ML Solostar Pen Take 30 units in the morning and 15 units at bedtime. 24 mL 4   Lancet Device MISC 1 each by Does not apply route in the morning, at noon, and at bedtime. May substitute to any manufacturer covered by patient's insurance. 1 each 0   Lancets Misc. MISC 1 each by Does not apply route in the morning, at noon, and at bedtime. May substitute to any manufacturer covered by patient's insurance. 100 each 3   lisinopril (ZESTRIL) 2.5 MG tablet TAKE ONE (1) TABLET BY MOUTH EVERY DAY 90 tablet 1   metoCLOPramide (REGLAN) 5 MG tablet TAKE 1 TO 2 TABLETS BY MOUTH EVERY 6 HOURS AS NEEDED FOR NAUSEA OR VOMITING. 120 tablet 3   montelukast (SINGULAIR) 10 MG tablet TAKE ONE TABLET (10MG  TOTAL) BY MOUTH ATBEDTIME 90 tablet 1   Omega-3 Fatty Acids (FISH OIL) 1000 MG CAPS Take 1 capsule by mouth daily.     ondansetron (ZOFRAN) 4 MG tablet TAKE ONE TABLET BY MOUTH EVERY EIGHT HOURS AS NEEDED FOR NAUSEA OR VOMITING 30 tablet 3   ONETOUCH DELICA LANCETS 33G MISC USE FOUR TIMES A DAY AS DIRECTED. 100 each 1   pantoprazole (PROTONIX) 40 MG tablet TAKE ONE TABLET (40MG  TOTAL) BY MOUTH TWO TIMES DAILY BEFORE A MEAL ONCE SYMPTOMS CONTROLLED, CUT BACK TO ONCE DAILY IF TOLERATED 180 tablet 1   rosuvastatin (CRESTOR) 40 MG tablet TAKE ONE (1) TABLET BY MOUTH EVERY DAY 90 tablet 3   sodium chloride (OCEAN) 0.65 % nasal spray Place 1 spray into the nose as needed for  congestion. 30 mL 12   SURE COMFORT PEN NEEDLES 30G X 8 MM MISC USE FIVE  TIMES A DAY 400 each 2   vitamin B-12 (CYANOCOBALAMIN) 100 MCG tablet Take 1 tablet by mouth daily.     VITAMIN D PO Take 1 Dose by mouth daily.     Continuous Glucose Receiver (FREESTYLE LIBRE 3 READER) DEVI 1 Device by Does not apply route once for 1 dose. 1 each 0   No current facility-administered medications for this visit.    PHYSICAL EXAM: Vitals:   01/22/24 1321  BP: 110/64  Pulse: 78  Resp: 20  SpO2: 96%  Weight: 125 lb 6.4 oz (56.9 kg)  Height: 5\' 7"  (1.702 m)    Body mass index is 19.64 kg/m.  Wt Readings from Last 3 Encounters:  01/22/24 125 lb 6.4 oz (56.9 kg)  01/15/24 123 lb 8 oz (56 kg)  10/23/23 123 lb (55.8 kg)    General: Well developed, well nourished female in no apparent distress.  HEENT: AT/[place], no external lesions.  Eyes: Conjunctiva clear and no icterus. Neck: Neck supple  Lungs: Respirations not labored Neurologic: Alert, oriented, normal speech Extremities / Skin: Dry.  Psychiatric: Does not appear depressed or anxious  Diabetic Foot Exam - Simple   No data filed     LABS Reviewed Lab Results  Component Value Date   HGBA1C 8.3 (A) 01/22/2024   HGBA1C 8.7 (H) 10/16/2023   HGBA1C 8.3 (A) 06/10/2023   No results found for: "FRUCTOSAMINE" Lab Results  Component Value Date   CHOL 174 10/16/2023   HDL 83 10/16/2023   LDLCALC 77 10/16/2023   TRIG 76 10/16/2023   CHOLHDL 2.1 10/16/2023   Lab Results  Component Value Date   MICRALBCREAT 5.4 03/11/2023   MICRALBCREAT 8.1 02/06/2022   Lab Results  Component Value Date   CREATININE 1.10 (H) 10/16/2023   Lab Results  Component Value Date   GFR 61.24 07/10/2021    ASSESSMENT / PLAN  1. Uncontrolled type 1 diabetes mellitus with hyperglycemia (HCC)    Diabetes Mellitus type 1, complicated by diabetic retinopathy/neuropathy/gastroparesis. - Diabetic status / severity: uncontrolled.  Lab Results  Component Value Date   HGBA1C 8.3 (A) 01/22/2024    - Hemoglobin A1c goal :  <7%  Discussed about importance of diabetes control to avoid chronic complications.  She has variable blood sugar with hypo and hyperglycemia.   Discussed that insulin pump therapy would be the best option for her.  She does not want to be on insulin pump.  - Medications: See below.  I) continue Toujeo 30 units in the morning and 15 units at bedtime. II) adjust NovoLog 14 to 18 units with meals 3 times a day and adjust based on experience/meal size and carbohydrate content.  Discussed about okay to take up to 5 units of NovoLog if hyperglycemia continues to after 2 hours of eating.  - Home glucose testing: Freestyle libre 3+ and check as needed.  Test supplies prescribed. - Discussed/ Gave Hypoglycemia treatment plan.  Advised to use glucose tablet to correct hypoglycemia.  Glucagon Emergency Kit prescribed in last visit.  # Consult : not required at this time.   # Annual urine for microalbuminuria/ creatinine ratio, no microalbuminuria currently, continue ACE/ARB /lisinopril.  Will check today. Last  Lab Results  Component Value Date   MICRALBCREAT 5.4 03/11/2023    # Foot check nightly / neuropathy.  Gastroparesis she has been on Reglan.  # She has proliferative diabetic  retinopathy, following with retina specialist/ophthalmology..   - Diet: Make healthy diabetic food choices.  Advised not to miss/skip meals for prolonged hours. - Life style / activity / exercise: Discussed.  2. Blood pressure  -  BP Readings from Last 1 Encounters:  01/22/24 110/64    - Control is in target.  - No change in current plans.  3. Lipid status / Hyperlipidemia - Last  Lab Results  Component Value Date   LDLCALC 77 10/16/2023   - Continue rosuvastatin 40 mg daily.  Diagnoses and all orders for this visit:  Uncontrolled type 1 diabetes mellitus with hyperglycemia (HCC) -     POCT glycosylated hemoglobin (Hb A1C) -     Microalbumin / creatinine urine ratio -     Blood Glucose Monitoring  Suppl DEVI; 1 each by Does not apply route in the morning, at noon, and at bedtime. May substitute to any manufacturer covered by patient's insurance. -     Glucose Blood (BLOOD GLUCOSE TEST STRIPS) STRP; 1 each by In Vitro route in the morning, at noon, and at bedtime. May substitute to any manufacturer covered by patient's insurance. -     Lancet Device MISC; 1 each by Does not apply route in the morning, at noon, and at bedtime. May substitute to any manufacturer covered by patient's insurance. -     Lancets Misc. MISC; 1 each by Does not apply route in the morning, at noon, and at bedtime. May substitute to any manufacturer covered by patient's insurance.   DISPOSITION Follow up in clinic in 3  months suggested.  Asked to call our clinic in between visits with any concern about glucose.   All questions answered and patient verbalized understanding of the plan.  Ashley Aarian Griffie, MD [facility] Endocrinology [facility] [address] [place], [address] [place], [place] [address] Phone # [phone]  At least part of this note was generated using voice recognition software. Inadvertent word errors may have occurred, which were not recognized during the proofreading process.

## 2024-01-23 LAB — MICROALBUMIN / CREATININE URINE RATIO
Creatinine, Urine: 110 mg/dL (ref 20–275)
Microalb Creat Ratio: 90 mg/g{creat} — ABNORMAL HIGH (ref <30)
Microalb, Ur: 9.9 mg/dL

## 2024-02-02 NOTE — Progress Notes (Deleted)
 GI Office Note    Referring Provider: Kerri Perches, MD Primary Care Physician:  Kerri Perches, MD  Primary Gastroenterologist: Hennie Duos. Marletta Lor, DO   Chief Complaint   No chief complaint on file.   History of Present Illness   Ashley Armstrong is a 58 y.o. female presenting today for follow-up.  Last seen September 2023.  History of chronic left upper quadrant pain, gastroparesis, bloating, GERD, constipation.  Plans for EGD after last visit to further evaluate her upper GI symptoms but this was never completed.      Medications   Current Outpatient Medications  Medication Sig Dispense Refill   Blood Glucose Monitoring Suppl (ONETOUCH VERIO FLEX SYSTEM) w/Device KIT USE TO TEST BLOOD SUGAR TWO TIMES A DAY 1 kit 0   Blood Glucose Monitoring Suppl DEVI 1 each by Does not apply route in the morning, at noon, and at bedtime. May substitute to any manufacturer covered by patient's insurance. 1 each 0   chlorpheniramine (CHLOR-TRIMETON) 4 MG tablet Take one tablet by mouth once daily for 3 days, then as needed, for uncontrolled allergies and ear pressure 14 tablet 0   Continuous Blood Gluc Receiver (FREESTYLE LIBRE 3 READER) DEVI 1 Device by Does not apply route continuous. 1 each 0   Continuous Glucose Receiver (FREESTYLE LIBRE 3 READER) DEVI 1 Device by Does not apply route once for 1 dose. 1 each 0   Continuous Glucose Sensor (FREESTYLE LIBRE 3 PLUS SENSOR) MISC 1 each by Does not apply route continuous. Change every 15 days. 6 each 3   diazepam (VALIUM) 10 MG tablet as needed.      DUREZOL 0.05 % EMUL Apply 1 drop to eye daily at 8 pm.     fluticasone (FLONASE) 50 MCG/ACT nasal spray USE TWO SPRAYS IN BOTH NOSTRILS DAILY 16 g 6   Glucagon (GVOKE HYPOPEN 1-PACK) 1 MG/0.2ML SOAJ Inject 1 mg into the skin as needed (low blood sugar with impaired consciousness). 0.4 mL 2   Glucose Blood (BLOOD GLUCOSE TEST STRIPS) STRP 1 each by In Vitro route in the morning, at noon,  and at bedtime. May substitute to any manufacturer covered by patient's insurance. 100 each 3   glucose blood (ONETOUCH VERIO) test strip USE AS BACKUP FOR SENSOR, ONCE A DAY AS NEEDED 50 each 2   insulin aspart (NOVOLOG FLEXPEN) 100 UNIT/ML FlexPen 16-18 units  at meals 45 mL 3   insulin glargine, 2 Unit Dial, (TOUJEO MAX SOLOSTAR) 300 UNIT/ML Solostar Pen Take 30 units in the morning and 15 units at bedtime. 24 mL 4   Lancet Device MISC 1 each by Does not apply route in the morning, at noon, and at bedtime. May substitute to any manufacturer covered by patient's insurance. 1 each 0   Lancets Misc. MISC 1 each by Does not apply route in the morning, at noon, and at bedtime. May substitute to any manufacturer covered by patient's insurance. 100 each 3   lisinopril (ZESTRIL) 2.5 MG tablet TAKE ONE (1) TABLET BY MOUTH EVERY DAY 90 tablet 1   metoCLOPramide (REGLAN) 5 MG tablet TAKE 1 TO 2 TABLETS BY MOUTH EVERY 6 HOURS AS NEEDED FOR NAUSEA OR VOMITING. 120 tablet 3   montelukast (SINGULAIR) 10 MG tablet TAKE ONE TABLET (10MG  TOTAL) BY MOUTH ATBEDTIME 90 tablet 1   Omega-3 Fatty Acids (FISH OIL) 1000 MG CAPS Take 1 capsule by mouth daily.     ondansetron (ZOFRAN) 4 MG tablet TAKE ONE  TABLET BY MOUTH EVERY EIGHT HOURS AS NEEDED FOR NAUSEA OR VOMITING 30 tablet 3   ONETOUCH DELICA LANCETS 33G MISC USE FOUR TIMES A DAY AS DIRECTED. 100 each 1   pantoprazole (PROTONIX) 40 MG tablet TAKE ONE TABLET (40MG  TOTAL) BY MOUTH TWO TIMES DAILY BEFORE A MEAL ONCE SYMPTOMS CONTROLLED, CUT BACK TO ONCE DAILY IF TOLERATED 180 tablet 1   rosuvastatin (CRESTOR) 40 MG tablet TAKE ONE (1) TABLET BY MOUTH EVERY DAY 90 tablet 3   sodium chloride (OCEAN) 0.65 % nasal spray Place 1 spray into the nose as needed for congestion. 30 mL 12   SURE COMFORT PEN NEEDLES 30G X 8 MM MISC USE FIVE TIMES A DAY 400 each 2   vitamin B-12 (CYANOCOBALAMIN) 100 MCG tablet Take 1 tablet by mouth daily.     VITAMIN D PO Take 1 Dose by mouth  daily.     No current facility-administered medications for this visit.    Allergies   Allergies as of 02/03/2024   (No Known Allergies)     Past Medical History   Past Medical History:  Diagnosis Date   Anxiety    Dyslipidemia    Gastroparesis due to DM (HCC) OCT 2014   75% AT 2 HRS, GLU >    H/O eye surgery 01/2018   Headache(784.0)    IDDM (insulin dependent diabetes mellitus)    Neuropathy    Nicotine addiction    TIA (transient ischemic attack)    Dr. Gerilyn Pilgrim    Past Surgical History   Past Surgical History:  Procedure Laterality Date   BREAST BIOPSY Left 08/24/2013   benign fibrocystic changes with calcifications   Cataract surgery Bilateral 01/20/2017, 02/03/2017   COLONOSCOPY N/A 05/09/2016   Dr. Darrick Penna: 2 hyperplastic polyps removed.  Next colonoscopy in 2027   ESOPHAGOGASTRODUODENOSCOPY N/A 08/31/2013   SLF: 1. Earky satiety nausea may be due to Gastroparesis/pyloric channel stenosis. 2. small hiatal hernia 3. Moderate erosive gastritis.    EYE SURGERY  01/2018   laser surgery bilateral eye      Past Family History   Family History  Problem Relation Age of Onset   Hypertension Mother    Diabetes Mother    Hyperlipidemia Mother    Rosacea Mother    Hypertension Father    Diabetes Brother    Breast cancer Maternal Aunt    Colon cancer Neg Hx     Past Social History   Social History   Socioeconomic History   Marital status: Single    Spouse name: Not on file   Number of children: Not on file   Years of education: Not on file   Highest education level: Not on file  Occupational History   Occupation: disabled  Tobacco Use   Smoking status: Former    Current packs/day: 0.00    Average packs/day: 1.5 packs/day for 24.0 years (36.0 ttl pk-yrs)    Types: Cigarettes    Start date: 08/24/1987    Quit date: 08/24/2011    Years since quitting: 12.4   Smokeless tobacco: Never  Vaping Use   Vaping status: Never Used  Substance and Sexual  Activity   Alcohol use: No   Drug use: No   Sexual activity: Not Currently    Birth control/protection: None  Other Topics Concern   Not on file  Social History Narrative   Disabled, lives with a room mate   Social Drivers of Health   Financial Resource Strain: Low Risk  (04/24/2022)  Overall Financial Resource Strain (CARDIA)    Difficulty of Paying Living Expenses: Not hard at all  Food Insecurity: No Food Insecurity (04/24/2022)   Hunger Vital Sign    Worried About Running Out of Food in the Last Year: Never true    Ran Out of Food in the Last Year: Never true  Transportation Needs: No Transportation Needs (04/24/2022)   PRAPARE - Administrator, Civil Service (Medical): No    Lack of Transportation (Non-Medical): No  Physical Activity: Insufficiently Active (04/29/2023)   Exercise Vital Sign    Days of Exercise per Week: 7 days    Minutes of Exercise per Session: 20 min  Stress: Stress Concern Present (04/24/2022)   [employer] of Occupational Health - Occupational Stress Questionnaire    Feeling of Stress : To some extent  Social Connections: Moderately Isolated (04/24/2022)   Social Connection and Isolation Panel [NHANES]    Frequency of Communication with Friends and Family: More than three times a week    Frequency of Social Gatherings with Friends and Family: More than three times a week    Attends Religious Services: More than 4 times per year    Active Member of [employer] or Organizations: No    Attends Banker Meetings: Never    Marital Status: Never married  Intimate Partner Violence: Not At Risk (04/24/2022)   Humiliation, Afraid, Rape, and Kick questionnaire    Fear of Current or Ex-Partner: No    Emotionally Abused: No    Physically Abused: No    Sexually Abused: No    Review of Systems   General: Negative for anorexia, weight loss, fever, chills, fatigue, weakness. ENT: Negative for hoarseness, difficulty swallowing , nasal  congestion. CV: Negative for chest pain, angina, palpitations, dyspnea on exertion, peripheral edema.  Respiratory: Negative for dyspnea at rest, dyspnea on exertion, cough, sputum, wheezing.  GI: See history of present illness. GU:  Negative for dysuria, hematuria, urinary incontinence, urinary frequency, nocturnal urination.  Endo: Negative for unusual weight change.     Physical Exam   LMP 07/19/2013    General: Well-nourished, well-developed in no acute distress.  Eyes: No icterus. Mouth: Oropharyngeal mucosa moist and pink , no lesions erythema or exudate. Lungs: Clear to auscultation bilaterally.  Heart: Regular rate and rhythm, no murmurs rubs or gallops.  Abdomen: Bowel sounds are normal, nontender, nondistended, no hepatosplenomegaly or masses,  no abdominal bruits or hernia , no rebound or guarding.  Rectal: ***  Extremities: No lower extremity edema. No clubbing or deformities. Neuro: Alert and oriented x 4   Skin: Warm and dry, no jaundice.   Psych: Alert and cooperative, normal mood and affect.  Labs   Lab Results  Component Value Date   NA 141 10/16/2023   CL 103 10/16/2023   K 4.5 10/16/2023   CO2 22 10/16/2023   BUN 19 10/16/2023   CREATININE 1.10 (H) 10/16/2023   EGFR 59 (L) 10/16/2023   CALCIUM 9.1 10/16/2023   ALBUMIN 4.3 10/16/2023   GLUCOSE 106 (H) 10/16/2023   Lab Results  Component Value Date   ALT 20 10/16/2023   AST 22 10/16/2023   ALKPHOS 95 10/16/2023   BILITOT 0.5 10/16/2023   Lab Results  Component Value Date   WBC 6.4 03/27/2023   HGB 12.6 03/27/2023   HCT 38.6 03/27/2023   MCV 90 03/27/2023   PLT 249 03/27/2023   Lab Results  Component Value Date   TSH 0.992 11/21/2022  Lab Results  Component Value Date   HGBA1C 8.3 (A) 01/22/2024    Imaging Studies   No results found.  Assessment       PLAN   ***   Leanna Battles. Melvyn Neth, MHS, PA-C [facility]

## 2024-02-03 ENCOUNTER — Ambulatory Visit: Admitting: Gastroenterology

## 2024-02-05 ENCOUNTER — Other Ambulatory Visit (INDEPENDENT_AMBULATORY_CARE_PROVIDER_SITE_OTHER): Payer: Self-pay | Admitting: Gastroenterology

## 2024-02-09 ENCOUNTER — Other Ambulatory Visit: Payer: Self-pay | Admitting: Endocrinology

## 2024-02-09 DIAGNOSIS — E1140 Type 2 diabetes mellitus with diabetic neuropathy, unspecified: Secondary | ICD-10-CM

## 2024-02-09 NOTE — Progress Notes (Unsigned)
 GI Office Note    Referring Provider: Kerri Perches, MD Primary Care Physician:  Kerri Perches, MD  Primary Gastroenterologist:  Chief Complaint   No chief complaint on file.   History of Present Illness   Ashley Armstrong is a 58 y.o. female presenting today   for follow-up.  Last seen September 2023.  History of chronic left upper quadrant pain, gastroparesis, bloating, GERD, constipation.   Plans for EGD after last visit to further evaluate her upper GI symptoms but this was never completed.        Medications   Current Outpatient Medications  Medication Sig Dispense Refill   Blood Glucose Monitoring Suppl (ONETOUCH VERIO FLEX SYSTEM) w/Device KIT USE TO TEST BLOOD SUGAR TWO TIMES A DAY 1 kit 0   Blood Glucose Monitoring Suppl DEVI 1 each by Does not apply route in the morning, at noon, and at bedtime. May substitute to any manufacturer covered by patient's insurance. 1 each 0   chlorpheniramine (CHLOR-TRIMETON) 4 MG tablet Take one tablet by mouth once daily for 3 days, then as needed, for uncontrolled allergies and ear pressure 14 tablet 0   Continuous Blood Gluc Receiver (FREESTYLE LIBRE 3 READER) DEVI 1 Device by Does not apply route continuous. 1 each 0   Continuous Glucose Receiver (FREESTYLE LIBRE 3 READER) DEVI 1 Device by Does not apply route once for 1 dose. 1 each 0   Continuous Glucose Sensor (FREESTYLE LIBRE 3 PLUS SENSOR) MISC 1 each by Does not apply route continuous. Change every 15 days. 6 each 3   diazepam (VALIUM) 10 MG tablet as needed.      DUREZOL 0.05 % EMUL Apply 1 drop to eye daily at 8 pm.     fluticasone (FLONASE) 50 MCG/ACT nasal spray USE TWO SPRAYS IN BOTH NOSTRILS DAILY 16 g 6   Glucagon (GVOKE HYPOPEN 1-PACK) 1 MG/0.2ML SOAJ Inject 1 mg into the skin as needed (low blood sugar with impaired consciousness). 0.4 mL 2   Glucose Blood (BLOOD GLUCOSE TEST STRIPS) STRP 1 each by In Vitro route in the morning, at noon, and at bedtime.  May substitute to any manufacturer covered by patient's insurance. 100 each 3   glucose blood (ONETOUCH VERIO) test strip USE AS BACKUP FOR SENSOR, ONCE A DAY AS NEEDED 50 each 2   insulin aspart (NOVOLOG FLEXPEN) 100 UNIT/ML FlexPen 16-18 units  at meals 45 mL 3   insulin glargine, 2 Unit Dial, (TOUJEO MAX SOLOSTAR) 300 UNIT/ML Solostar Pen Take 30 units in the morning and 15 units at bedtime. 24 mL 4   Insulin Pen Needle (SURE COMFORT PEN NEEDLES) 30G X 8 MM MISC USE FIVE TIMES A DAY 500 each 2   Lancet Device MISC 1 each by Does not apply route in the morning, at noon, and at bedtime. May substitute to any manufacturer covered by patient's insurance. 1 each 0   Lancets Misc. MISC 1 each by Does not apply route in the morning, at noon, and at bedtime. May substitute to any manufacturer covered by patient's insurance. 100 each 3   lisinopril (ZESTRIL) 2.5 MG tablet TAKE ONE (1) TABLET BY MOUTH EVERY DAY 90 tablet 1   metoCLOPramide (REGLAN) 5 MG tablet TAKE 1 TO 2 TABLETS BY MOUTH EVERY 6 HOURS AS NEEDED FOR NAUSEA OR VOMITING. 120 tablet 3   montelukast (SINGULAIR) 10 MG tablet TAKE ONE TABLET (10MG  TOTAL) BY MOUTH ATBEDTIME 90 tablet 1   Omega-3 Fatty Acids (  FISH OIL) 1000 MG CAPS Take 1 capsule by mouth daily.     ondansetron (ZOFRAN) 4 MG tablet TAKE ONE TABLET BY MOUTH EVERY EIGHT HOURS AS NEEDED FOR NAUSEA OR VOMITING 30 tablet 3   ONETOUCH DELICA LANCETS 33G MISC USE FOUR TIMES A DAY AS DIRECTED. 100 each 1   pantoprazole (PROTONIX) 40 MG tablet TAKE ONE TABLET (40MG  TOTAL) BY MOUTH TWO TIMES DAILY BEFORE A MEAL ONCE SYMPTOMS CONTROLLED, CUT BACK TO ONCE DAILY IF TOLERATED 180 tablet 1   rosuvastatin (CRESTOR) 40 MG tablet TAKE ONE (1) TABLET BY MOUTH EVERY DAY 90 tablet 3   sodium chloride (OCEAN) 0.65 % nasal spray Place 1 spray into the nose as needed for congestion. 30 mL 12   vitamin B-12 (CYANOCOBALAMIN) 100 MCG tablet Take 1 tablet by mouth daily.     VITAMIN D PO Take 1 Dose by mouth  daily.     No current facility-administered medications for this visit.    Allergies   Allergies as of 02/10/2024   (No Known Allergies)     Past Medical History   Past Medical History:  Diagnosis Date   Anxiety    Dyslipidemia    Gastroparesis due to DM (HCC) OCT 2014   75% AT 2 HRS, GLU >    H/O eye surgery 01/2018   Headache(784.0)    IDDM (insulin dependent diabetes mellitus)    Neuropathy    Nicotine addiction    TIA (transient ischemic attack)    Dr. Gerilyn Pilgrim    Past Surgical History   Past Surgical History:  Procedure Laterality Date   BREAST BIOPSY Left 08/24/2013   benign fibrocystic changes with calcifications   Cataract surgery Bilateral 01/20/2017, 02/03/2017   COLONOSCOPY N/A 05/09/2016   Dr. Darrick Penna: 2 hyperplastic polyps removed.  Next colonoscopy in 2027   ESOPHAGOGASTRODUODENOSCOPY N/A 08/31/2013   SLF: 1. Earky satiety nausea may be due to Gastroparesis/pyloric channel stenosis. 2. small hiatal hernia 3. Moderate erosive gastritis.    EYE SURGERY  01/2018   laser surgery bilateral eye      Past Family History   Family History  Problem Relation Age of Onset   Hypertension Mother    Diabetes Mother    Hyperlipidemia Mother    Rosacea Mother    Hypertension Father    Diabetes Brother    Breast cancer Maternal Aunt    Colon cancer Neg Hx     Past Social History   Social History   Socioeconomic History   Marital status: Single    Spouse name: Not on file   Number of children: Not on file   Years of education: Not on file   Highest education level: Not on file  Occupational History   Occupation: disabled  Tobacco Use   Smoking status: Former    Current packs/day: 0.00    Average packs/day: 1.5 packs/day for 24.0 years (36.0 ttl pk-yrs)    Types: Cigarettes    Start date: 08/24/1987    Quit date: 08/24/2011    Years since quitting: 12.4   Smokeless tobacco: Never  Vaping Use   Vaping status: Never Used  Substance and Sexual  Activity   Alcohol use: No   Drug use: No   Sexual activity: Not Currently    Birth control/protection: None  Other Topics Concern   Not on file  Social History Narrative   Disabled, lives with a room mate   Social Drivers of Health   Financial Resource Strain: Low Risk  (  04/24/2022)   Overall Financial Resource Strain (CARDIA)    Difficulty of Paying Living Expenses: Not hard at all  Food Insecurity: No Food Insecurity (04/24/2022)   Hunger Vital Sign    Worried About Running Out of Food in the Last Year: Never true    Ran Out of Food in the Last Year: Never true  Transportation Needs: No Transportation Needs (04/24/2022)   PRAPARE - Administrator, Civil Service (Medical): No    Lack of Transportation (Non-Medical): No  Physical Activity: Insufficiently Active (04/29/2023)   Exercise Vital Sign    Days of Exercise per Week: 7 days    Minutes of Exercise per Session: 20 min  Stress: Stress Concern Present (04/24/2022)   [employer] of Occupational Health - Occupational Stress Questionnaire    Feeling of Stress : To some extent  Social Connections: Moderately Isolated (04/24/2022)   Social Connection and Isolation Panel [NHANES]    Frequency of Communication with Friends and Family: More than three times a week    Frequency of Social Gatherings with Friends and Family: More than three times a week    Attends Religious Services: More than 4 times per year    Active Member of [employer] or Organizations: No    Attends Banker Meetings: Never    Marital Status: Never married  Intimate Partner Violence: Not At Risk (04/24/2022)   Humiliation, Afraid, Rape, and Kick questionnaire    Fear of Current or Ex-Partner: No    Emotionally Abused: No    Physically Abused: No    Sexually Abused: No    Review of Systems   General: Negative for anorexia, weight loss, fever, chills, fatigue, weakness. ENT: Negative for hoarseness, difficulty swallowing , nasal  congestion. CV: Negative for chest pain, angina, palpitations, dyspnea on exertion, peripheral edema.  Respiratory: Negative for dyspnea at rest, dyspnea on exertion, cough, sputum, wheezing.  GI: See history of present illness. GU:  Negative for dysuria, hematuria, urinary incontinence, urinary frequency, nocturnal urination.  Endo: Negative for unusual weight change.     Physical Exam   LMP 07/19/2013    General: Well-nourished, well-developed in no acute distress.  Eyes: No icterus. Mouth: Oropharyngeal mucosa moist and pink , no lesions erythema or exudate. Lungs: Clear to auscultation bilaterally.  Heart: Regular rate and rhythm, no murmurs rubs or gallops.  Abdomen: Bowel sounds are normal, nontender, nondistended, no hepatosplenomegaly or masses,  no abdominal bruits or hernia , no rebound or guarding.  Rectal: ***  Extremities: No lower extremity edema. No clubbing or deformities. Neuro: Alert and oriented x 4   Skin: Warm and dry, no jaundice.   Psych: Alert and cooperative, normal mood and affect.  Labs   Lab Results  Component Value Date   NA 141 10/16/2023   CL 103 10/16/2023   K 4.5 10/16/2023   CO2 22 10/16/2023   BUN 19 10/16/2023   CREATININE 1.10 (H) 10/16/2023   EGFR 59 (L) 10/16/2023   CALCIUM 9.1 10/16/2023   ALBUMIN 4.3 10/16/2023   GLUCOSE 106 (H) 10/16/2023   Lab Results  Component Value Date   ALT 20 10/16/2023   AST 22 10/16/2023   ALKPHOS 95 10/16/2023   BILITOT 0.5 10/16/2023   Lab Results  Component Value Date   WBC 6.4 03/27/2023   HGB 12.6 03/27/2023   HCT 38.6 03/27/2023   MCV 90 03/27/2023   PLT 249 03/27/2023   Lab Results  Component Value Date   TSH  0.992 11/21/2022   Lab Results  Component Value Date   HGBA1C 8.3 (A) 01/22/2024    Imaging Studies   No results found.  Assessment       PLAN   ***   Leanna Battles. Melvyn Neth, MHS, PA-C [facility]

## 2024-02-10 ENCOUNTER — Ambulatory Visit: Admitting: Gastroenterology

## 2024-02-10 ENCOUNTER — Encounter: Payer: Self-pay | Admitting: Gastroenterology

## 2024-02-10 VITALS — BP 136/73 | HR 77 | Temp 97.7°F | Ht 67.0 in | Wt 126.0 lb

## 2024-02-10 DIAGNOSIS — R140 Abdominal distension (gaseous): Secondary | ICD-10-CM | POA: Diagnosis not present

## 2024-02-10 DIAGNOSIS — R109 Unspecified abdominal pain: Secondary | ICD-10-CM | POA: Diagnosis not present

## 2024-02-10 NOTE — Patient Instructions (Signed)
 Continue pantoprazole 40 mg daily before breakfast. Complete labs as ordered, [facility] at [address] [place].  We will be in touch with results.  Based on findings she may need to have an abdominal ultrasound as next step. Try to weigh herself at least once weekly, monitor for additional weight gain.

## 2024-02-12 ENCOUNTER — Other Ambulatory Visit: Payer: Self-pay | Admitting: *Deleted

## 2024-02-12 ENCOUNTER — Encounter: Payer: Self-pay | Admitting: *Deleted

## 2024-02-12 DIAGNOSIS — R140 Abdominal distension (gaseous): Secondary | ICD-10-CM

## 2024-02-14 LAB — CBC WITH DIFFERENTIAL/PLATELET
Basophils Absolute: 0.1 10*3/uL (ref 0.0–0.2)
Basos: 1 %
EOS (ABSOLUTE): 0.1 10*3/uL (ref 0.0–0.4)
Eos: 1 %
Hematocrit: 40 % (ref 34.0–46.6)
Hemoglobin: 13 g/dL (ref 11.1–15.9)
Immature Grans (Abs): 0 10*3/uL (ref 0.0–0.1)
Immature Granulocytes: 0 %
Lymphocytes Absolute: 1.5 10*3/uL (ref 0.7–3.1)
Lymphs: 25 %
MCH: 29.5 pg (ref 26.6–33.0)
MCHC: 32.5 g/dL (ref 31.5–35.7)
MCV: 91 fL (ref 79–97)
Monocytes Absolute: 0.4 10*3/uL (ref 0.1–0.9)
Monocytes: 7 %
Neutrophils Absolute: 3.8 10*3/uL (ref 1.4–7.0)
Neutrophils: 66 %
Platelets: 217 10*3/uL (ref 150–450)
RBC: 4.41 x10E6/uL (ref 3.77–5.28)
RDW: 12.4 % (ref 11.7–15.4)
WBC: 5.8 10*3/uL (ref 3.4–10.8)

## 2024-02-14 LAB — COMPREHENSIVE METABOLIC PANEL WITH GFR
ALT: 28 IU/L (ref 0–32)
AST: 26 IU/L (ref 0–40)
Albumin: 4.3 g/dL (ref 3.8–4.9)
Alkaline Phosphatase: 91 IU/L (ref 44–121)
BUN/Creatinine Ratio: 13 (ref 9–23)
BUN: 14 mg/dL (ref 6–24)
Bilirubin Total: 0.3 mg/dL (ref 0.0–1.2)
CO2: 23 mmol/L (ref 20–29)
Calcium: 9.5 mg/dL (ref 8.7–10.2)
Chloride: 104 mmol/L (ref 96–106)
Creatinine, Ser: 1.12 mg/dL — ABNORMAL HIGH (ref 0.57–1.00)
Globulin, Total: 2.2 g/dL (ref 1.5–4.5)
Glucose: 178 mg/dL — ABNORMAL HIGH (ref 70–99)
Potassium: 4.8 mmol/L (ref 3.5–5.2)
Sodium: 142 mmol/L (ref 134–144)
Total Protein: 6.5 g/dL (ref 6.0–8.5)
eGFR: 58 mL/min/{1.73_m2} — ABNORMAL LOW (ref >59)

## 2024-02-14 LAB — TISSUE TRANSGLUTAMINASE, IGA: Transglutaminase IgA: <2 U/mL (ref 0–3)

## 2024-02-14 LAB — LIPASE: Lipase: 16 U/L (ref 14–72)

## 2024-02-14 LAB — IGA: IgA/Immunoglobulin A, Serum: 183 mg/dL (ref 87–352)

## 2024-02-20 ENCOUNTER — Ambulatory Visit (HOSPITAL_COMMUNITY)
Admission: RE | Admit: 2024-02-20 | Discharge: 2024-02-20 | Disposition: A | Discharge status: Home / Self Care | Source: Ambulatory Visit | Attending: Gastroenterology | Admitting: Gastroenterology

## 2024-02-20 DIAGNOSIS — R140 Abdominal distension (gaseous): Secondary | ICD-10-CM | POA: Diagnosis not present

## 2024-02-21 ENCOUNTER — Encounter: Payer: Self-pay | Admitting: Family Medicine

## 2024-02-21 DIAGNOSIS — R21 Rash and other nonspecific skin eruption: Secondary | ICD-10-CM | POA: Insufficient documentation

## 2024-02-21 NOTE — Assessment & Plan Note (Signed)
 6 month h/o rash on right ankle refer derm

## 2024-02-21 NOTE — Assessment & Plan Note (Signed)
 Hyperlipidemia:Low fat diet discussed and encouraged.   Lipid Panel  Lab Results  Component Value Date   CHOL 174 10/16/2023   HDL 83 10/16/2023   LDLCALC 77 10/16/2023   TRIG 76 10/16/2023   CHOLHDL 2.1 10/16/2023     Controlled, no change in medication

## 2024-02-21 NOTE — Progress Notes (Signed)
 GEORGINA Armstrong     MRN: [id number]      DOB: 11-21-1965  Chief Complaint  Patient presents with   Medical Management of Chronic Issues   Foot Pain    Rash/pain x 1 yr on R foot    HPI [name] is here for follow up and re-evaluation of chronic medical conditions, medication management and review of any available recent lab and radiology data.  Preventive health is updated, specifically  Cancer screening and Immunization.   Questions or concerns regarding consultations or procedures which the PT has had in the interim are  addressed. The PT denies any adverse reactions to current medications since the last visit.  There are no new concerns.  There are no specific complaints   ROS Denies recent fever or chills. Denies sinus pressure, nasal congestion, ear pain or sore throat. Denies chest congestion, productive cough or wheezing. Denies chest pains, palpitations and leg swelling Denies abdominal pain, nausea, vomiting,diarrhea or constipation.   Denies dysuria, frequency, hesitancy or incontinence. Denies joint pain, swelling and limitation in mobility. Denies headaches, seizures, numbness, or tingling. Denies depression, anxiety or insomnia. Denies skin break down or rash.   PE  BP 109/73   Pulse 92   Ht 5\' 7"  (1.702 m)   Wt 123 lb 8 oz (56 kg)   LMP 07/19/2013   SpO2 98%   BMI 19.34 kg/m   Patient alert and oriented and in no cardiopulmonary distress.  HEENT: No facial asymmetry, EOMI,     Neck supple .  Chest: Clear to auscultation bilaterally.  CVS: S1, S2 no murmurs, no S3.Regular rate.  ABD: Soft non tender.   Ext: No edema  MS: Adequate ROM spine, shoulders, hips and knees.  Skin: Intact, no ulcerations or rash noted.  Psych: Good eye contact, normal affect. Memory intact not anxious or depressed appearing.  CNS: CN 2-12 intact, power,  normal throughout.no focal deficits noted.   Assessment & Plan  Rash and nonspecific skin eruption 6 month  h/o rash on right ankle refer derm  Type 2 diabetes mellitus with diabetic neuropathy, unspecified (HCC) Uncontrolled, endo manages Diabetes associated with hyperlipidemia  [name] is reminded of the importance of commitment to daily physical activity for 30 minutes or more, as able and the need to limit carbohydrate intake to 30 to 60 grams per meal to help with blood sugar control.   The need to take medication as prescribed, test blood sugar as directed, and to call between visits if there is a concern that blood sugar is uncontrolled is also discussed.   [name] is reminded of the importance of daily foot exam, annual eye examination, and good blood sugar, blood pressure and cholesterol control.     Latest Ref Rng & Units 02/10/2024   10:25 AM 01/22/2024    1:39 PM 01/22/2024    1:23 PM 10/16/2023    8:16 AM 06/10/2023   10:49 AM  Diabetic Labs  HbA1c 4.0 - 5.6 %   8.3  8.7  8.3   Microalbumin mg/dL  9.9      Micro/Creat Ratio <30 mg/g creat  90      Chol 100 - 199 mg/dL    409    HDL >81 mg/dL    83    Calc LDL 0 - 99 mg/dL    77    Triglycerides 0 - 149 mg/dL    76    Creatinine 1.91 - 1.00 mg/dL 4.78  1.10        02/10/2024    8:56 AM 02/10/2024    8:54 AM 01/22/2024    1:21 PM 01/15/2024   11:10 AM 10/23/2023    2:37 PM 09/15/2023    8:43 AM 09/11/2023    1:03 PM  BP/Weight  Systolic BP 136 145 110 109 128  120  Diastolic BP 73 76 64 73 70  73  Wt. (Lbs)  126 125.4 123.5 123 122.12 122.12  BMI  19.73 kg/m2 19.64 kg/m2 19.34 kg/m2 19.26 kg/m2 19.13 kg/m2 19.13 kg/m2      Latest Ref Rng & Units 09/19/2023   12:00 AM 11/21/2022    2:20 PM  Foot/eye exam completion dates  Eye Exam No Retinopathy Retinopathy       Foot Form Completion   Done     This result is from an external source.        Hyperlipidemia LDL goal <100 Hyperlipidemia:Low fat diet discussed and encouraged.   Lipid Panel  Lab Results  Component Value Date   CHOL 174 10/16/2023   HDL 83  10/16/2023   LDLCALC 77 10/16/2023   TRIG 76 10/16/2023   CHOLHDL 2.1 10/16/2023     Controlled, no change in medication   Generalized anxiety disorder Controlled, no change in medication   Depression, major, single episode, severe (HCC) Managed by psych and controlled  Vitamin D deficiency Updated lab needed at/ before next visit.

## 2024-02-21 NOTE — Assessment & Plan Note (Signed)
Managed by psych and controlled 

## 2024-02-21 NOTE — Assessment & Plan Note (Signed)
 Updated lab needed at/ before next visit.

## 2024-02-21 NOTE — Assessment & Plan Note (Signed)
 Controlled, no change in medication

## 2024-02-21 NOTE — Assessment & Plan Note (Signed)
 Uncontrolled, endo manages Diabetes associated with hyperlipidemia  Ms. Faciane is reminded of the importance of commitment to daily physical activity for 30 minutes or more, as able and the need to limit carbohydrate intake to 30 to 60 grams per meal to help with blood sugar control.   The need to take medication as prescribed, test blood sugar as directed, and to call between visits if there is a concern that blood sugar is uncontrolled is also discussed.   Ms. Medley is reminded of the importance of daily foot exam, annual eye examination, and good blood sugar, blood pressure and cholesterol control.     Latest Ref Rng & Units 02/10/2024   10:25 AM 01/22/2024    1:39 PM 01/22/2024    1:23 PM 10/16/2023    8:16 AM 06/10/2023   10:49 AM  Diabetic Labs  HbA1c 4.0 - 5.6 %   8.3  8.7  8.3   Microalbumin mg/dL  9.9      Micro/Creat Ratio <30 mg/g creat  90      Chol 100 - 199 mg/dL    045    HDL >40 mg/dL    83    Calc LDL 0 - 99 mg/dL    77    Triglycerides 0 - 149 mg/dL    76    Creatinine 9.81 - 1.00 mg/dL 1.91    4.78        12/21/5619    8:56 AM 02/10/2024    8:54 AM 01/22/2024    1:21 PM 01/15/2024   11:10 AM 10/23/2023    2:37 PM 09/15/2023    8:43 AM 09/11/2023    1:03 PM  BP/Weight  Systolic BP 136 145 110 109 128  120  Diastolic BP 73 76 64 73 70  73  Wt. (Lbs)  126 125.4 123.5 123 122.12 122.12  BMI  19.73 kg/m2 19.64 kg/m2 19.34 kg/m2 19.26 kg/m2 19.13 kg/m2 19.13 kg/m2      Latest Ref Rng & Units 09/19/2023   12:00 AM 11/21/2022    2:20 PM  Foot/eye exam completion dates  Eye Exam No Retinopathy Retinopathy       Foot Form Completion   Done     This result is from an external source.

## 2024-02-25 ENCOUNTER — Telehealth: Payer: Self-pay

## 2024-02-25 NOTE — Telephone Encounter (Signed)
 Patient called and Mychart message sent to patient with instructions: Per MD no action needed

## 2024-03-03 ENCOUNTER — Ambulatory Visit: Admitting: Podiatry

## 2024-03-05 ENCOUNTER — Ambulatory Visit (INDEPENDENT_AMBULATORY_CARE_PROVIDER_SITE_OTHER)

## 2024-03-05 ENCOUNTER — Encounter: Payer: Self-pay | Admitting: Podiatry

## 2024-03-05 ENCOUNTER — Ambulatory Visit: Admitting: Podiatry

## 2024-03-05 DIAGNOSIS — L600 Ingrowing nail: Secondary | ICD-10-CM

## 2024-03-05 DIAGNOSIS — E119 Type 2 diabetes mellitus without complications: Secondary | ICD-10-CM

## 2024-03-05 DIAGNOSIS — M2042 Other hammer toe(s) (acquired), left foot: Secondary | ICD-10-CM

## 2024-03-05 NOTE — Progress Notes (Signed)
 Chief Complaint  Patient presents with   Nail Problem    "I injured my toe but I don't know how I did it.  I noticed it on Wednesday morning when I took a shower.  I have Neuropathy." N - toenail L - hallux left D - between Monday and Wednesday O - suddenly C - throbbing, red, swelling, bleed under the toenail A - I'm not sure due to Neuropathy T - I cleaned it with Alcohol, put Neosporin on it, bandage, wearing this shoe    HPI: 58 y.o. female presenting today for new complaint associated to the left hallux nail plate.  Patient is unsure when she injured the toenail but she noticed bleeding from the toenail.  PMH diabetes mellitus with peripheral polyneuropathy.  She is not there anything for treatment  Past Medical History:  Diagnosis Date   Anxiety    Dyslipidemia    Gastroparesis due to DM (HCC) OCT 2014   75% AT 2 HRS, GLU >    H/O eye surgery 01/2018   Headache(784.0)    IDDM (insulin  dependent diabetes mellitus)    Neuropathy    Nicotine addiction    TIA (transient ischemic attack)    Dr. Joleen Navy    Past Surgical History:  Procedure Laterality Date   BREAST BIOPSY Left 08/24/2013   benign fibrocystic changes with calcifications   Cataract surgery Bilateral 01/20/2017, 02/03/2017   COLONOSCOPY N/A 05/09/2016   Dr. Nolene Baumgarten: 2 hyperplastic polyps removed.  Next colonoscopy in 2027   ESOPHAGOGASTRODUODENOSCOPY N/A 08/31/2013   SLF: 1. Earky satiety nausea may be due to Gastroparesis/pyloric channel stenosis. 2. small hiatal hernia 3. Moderate erosive gastritis.    EYE SURGERY  01/2018   laser surgery bilateral eye      No Known Allergies   Physical Exam: General: The patient is alert and oriented x3 in no acute distress.  Dermatology: Skin is warm, dry and supple bilateral lower extremities.  Loosely adhered left hallux nail plate noted across the entirety of the nail with some sanguinous drainage.  No purulence or concern clinically for infection  Vascular:  Palpable pedal pulses bilaterally. Capillary refill within normal limits.  No appreciable edema.  No erythema.  Neurological: Diminished via light touch  Musculoskeletal Exam: No pedal deformities noted.  Toes in rectus alignment  Radiographic exam LT foot 03/05/2024: No acute fracture identified.  Toes in rectus alignment.  Joint spaces preserved.  Impression: Negative  Assessment/Plan of Care: 1.  Traumatic injury to nail plate left hallux  -Patient evaluated -Decision made today to perform a total temporary nail avulsion to the left hallux nail plate to allow the new nail to grow.  This was discussed with the patient who agrees and consents. -The toe was prepped in aseptic manner and digital block performed using 3 mL of 2% lidocaine plain.  The nail was avulsed in its entirety and sterile dressings applied -Post care instructions provided -Patient requested cam boot.  Cam boot dispensed.  WBAT -Return to clinic 2 weeks for follow-up    Dot Gazella, DPM [facility]  Dr. Dot Gazella, DPM    [address] [place], [place] [address]  Office [phone]  Fax [phone]

## 2024-03-16 ENCOUNTER — Ambulatory Visit: Payer: PPO | Admitting: Podiatry

## 2024-03-22 DIAGNOSIS — E103591 Type 1 diabetes mellitus with proliferative diabetic retinopathy without macular edema, right eye: Secondary | ICD-10-CM | POA: Diagnosis not present

## 2024-03-22 DIAGNOSIS — H43821 Vitreomacular adhesion, right eye: Secondary | ICD-10-CM | POA: Diagnosis not present

## 2024-03-22 DIAGNOSIS — E103512 Type 1 diabetes mellitus with proliferative diabetic retinopathy with macular edema, left eye: Secondary | ICD-10-CM | POA: Diagnosis not present

## 2024-03-22 DIAGNOSIS — H43391 Other vitreous opacities, right eye: Secondary | ICD-10-CM | POA: Diagnosis not present

## 2024-03-22 DIAGNOSIS — H31093 Other chorioretinal scars, bilateral: Secondary | ICD-10-CM | POA: Diagnosis not present

## 2024-03-22 DIAGNOSIS — H35371 Puckering of macula, right eye: Secondary | ICD-10-CM | POA: Diagnosis not present

## 2024-03-22 DIAGNOSIS — H4321 Crystalline deposits in vitreous body, right eye: Secondary | ICD-10-CM | POA: Diagnosis not present

## 2024-03-23 ENCOUNTER — Ambulatory Visit: Admitting: Podiatry

## 2024-03-23 DIAGNOSIS — B351 Tinea unguium: Secondary | ICD-10-CM

## 2024-03-23 DIAGNOSIS — M79675 Pain in left toe(s): Secondary | ICD-10-CM

## 2024-03-23 DIAGNOSIS — M79674 Pain in right toe(s): Secondary | ICD-10-CM | POA: Diagnosis not present

## 2024-03-23 NOTE — Progress Notes (Unsigned)
    Subjective:  Patient ID: Ashley Armstrong, female    DOB: 07-20-1966,  MRN: [id number]  Ashley Armstrong presents to clinic today for:  Chief Complaint  Patient presents with   Nail Problem    DFC, Pt states the nail is doing better, 5th toe looks red and sore pt states she doesn't remember hitting it.   Patient notes nails are thick, discolored, elongated and painful in shoegear when trying to ambulate.  Patient had injured her left great toenail a few weeks ago and this nail was removed by Dr. Luster Salters.  This is went on to heal uneventfully.  PCP is Towanda Fret, MD.  Past Medical History:  Diagnosis Date   Anxiety    Dyslipidemia    Gastroparesis due to DM [facility]) OCT 2014   75% AT 2 HRS, GLU >    H/O eye surgery 01/2018   Headache(784.0)    IDDM (insulin  dependent diabetes mellitus)    Neuropathy    Nicotine addiction    TIA (transient ischemic attack)    Dr. Joleen Navy   Past Surgical History:  Procedure Laterality Date   BREAST BIOPSY Left 08/24/2013   benign fibrocystic changes with calcifications   Cataract surgery Bilateral 01/20/2017, 02/03/2017   COLONOSCOPY N/A 05/09/2016   Dr. Nolene Baumgarten: 2 hyperplastic polyps removed.  Next colonoscopy in 2027   ESOPHAGOGASTRODUODENOSCOPY N/A 08/31/2013   SLF: 1. Earky satiety nausea may be due to Gastroparesis/pyloric channel stenosis. 2. small hiatal hernia 3. Moderate erosive gastritis.    EYE SURGERY  01/2018   laser surgery bilateral eye     No Known Allergies  Review of Systems: Negative except as noted in the HPI.  Objective:  Ashley Armstrong is a pleasant 58 y.o. female in NAD. AAO x 3.  Vascular Examination: Capillary refill time is 3-5 seconds to toes bilateral. Palpable pedal pulses b/l LE. Digital hair present b/l.  Skin temperature gradient WNL b/l. No varicosities b/l. No cyanosis noted b/l.   Dermatological Examination: Pedal skin with normal turgor, texture and tone b/l. No open wounds. No interdigital macerations  b/l. Toenails x 9 are 3mm thick, discolored, dystrophic with subungual debris. There is pain with compression of the nail plates.  They are elongated x 9.  The hallux avulsion site has healed well with no signs of infection and no areas of necrosis noted.     Latest Ref Rng & Units 01/22/2024    1:23 PM 10/16/2023    8:16 AM 06/10/2023   10:49 AM  Hemoglobin A1C  Hemoglobin-A1c 4.0 - 5.6 % 8.3  8.7  8.3    Assessment/Plan: 1. Pain due to onychomycosis of toenails of both feet     The mycotic toenails were sharply debrided x 9 with sterile nail nippers and a power debriding burr to decrease bulk/thickness and length.    Return in about 3 months (around 06/23/2024) for RFC.   Joe Murders, DPM, FACFAS [facility]     [address] [place], [place] [address]                Office [phone]  Fax [phone]

## 2024-04-06 DIAGNOSIS — F411 Generalized anxiety disorder: Secondary | ICD-10-CM | POA: Diagnosis not present

## 2024-04-27 ENCOUNTER — Encounter: Payer: Self-pay | Admitting: Endocrinology

## 2024-04-27 ENCOUNTER — Ambulatory Visit: Admitting: Endocrinology

## 2024-04-27 ENCOUNTER — Ambulatory Visit: Payer: Self-pay | Admitting: Endocrinology

## 2024-04-27 VITALS — BP 122/60 | HR 75 | Resp 20 | Ht 67.0 in | Wt 126.8 lb

## 2024-04-27 DIAGNOSIS — E1065 Type 1 diabetes mellitus with hyperglycemia: Secondary | ICD-10-CM

## 2024-04-27 LAB — POCT GLYCOSYLATED HEMOGLOBIN (HGB A1C): Hemoglobin A1C: 7.4 % — AB (ref 4.0–5.6)

## 2024-04-27 MED ORDER — NOVOLOG FLEXPEN 100 UNIT/ML ~~LOC~~ SOPN: PEN_INJECTOR | SUBCUTANEOUS | 4 refills | Status: DC

## 2024-04-27 MED ORDER — BLOOD GLUCOSE TEST VI STRP
1.0000 | ORAL_STRIP | Freq: Three times a day (TID) | 3 refills | Status: AC
Start: 2024-04-27 — End: 2024-09-07

## 2024-04-27 NOTE — Progress Notes (Signed)
 Outpatient Endocrinology Note [place] Aydian Dimmick, MD  04/27/24  Patient's Name: Ashley Armstrong    DOB: 04/05/1966    MRN: [id number]                                                    REASON OF VISIT: Follow up for type 1 diabetes mellitus  PCP: Towanda Fret, MD  HISTORY OF PRESENT ILLNESS:   Ashley Armstrong is a 58 y.o. old female with past medical history listed below, is here for follow up for type 1 diabetes mellitus.   Pertinent Diabetes History: Patient was previously seen by Dr. Hubert Madden and was last time seen in July 2024.  Patient was diagnosed with type 1 diabetes mellitus at the age of 23.  She has uncontrolled type 1 diabetes mellitus with hemoglobin A1c mostly in the range of 8 to 10%.  Chronic Diabetes Complications : Retinopathy: Proliferative diabetic retinopathy bilaterally. Last ophthalmology exam was done on 09/2023, following with ophthalmology / retina specialist regularly.  Nephropathy: no /lisinopril . Peripheral neuropathy: yes Coronary artery disease: no.  She has atherosclerosis on CT scan. Stroke: no  GASTROPARESIS: Has symptoms of fullness and nausea, will take Zofran  as needed  Gastric emptying study showed significant abnormality in 2014 Followed by gastroenterologist  She is on Reglan  before meals, taking 5 mg tid and occasionally may take an extra pill for nausea No episodes of vomiting but occasionally may have fullness    Relevant comorbidities and cardiovascular risk factors: Obesity: no Body mass index is 19.86 kg/m.  Hypertension: Yes  Hyperlipidemia : Yes, on statin   Current / Home Diabetic regimen includes:  INSULIN  regimen is: TOUJEO  35 units a.m. -15 units p.m.  NovoLog  at mealtimes mostly 14-18 units  with meals.   Prior diabetic medications: Lantus  in the past.  She declined insulin  pump in the past.  Glycemic data:    CONTINUOUS GLUCOSE MONITORING SYSTEM (CGMS) INTERPRETATION: At today's visit, we reviewed CGM downloads. The full  report is scanned in the media. Reviewing the CGM trends, blood glucose are as follows:  FreeStyle Libre 2 CGM-  Sensor Download (Sensor download was reviewed and summarized below.) Dates: June 4 to June 17 , 2025, 14 days  Glucose Management Indicator: 7%  Sensor usage at 96%    Previous:    Interpretation: Mostly acceptable blood sugar.  Random hypoglycemia mild in 60 and occasionally up to 50 range early morning, overnight and in the evening.  Rare random hyperglycemia with blood sugar 282-300 range.  Overall improvement on the blood sugars.  No concerning hyperglycemia.  Hypoglycemia: Patient has minor hypoglycemic episodes. Patient has hypoglycemia awareness.  Factors modifying glucose control: 1.  Diabetic diet assessment: 2-3 meals a day however different timing at variable times.  2.  Staying active or exercising: No formal exercise.  3.  Medication compliance: compliant most of the time.  Interval history  Hemoglobin A1c 7.4%, improved, congratulated her.  CGM data as reviewed above.  GMI on CGM 7%.  She increased Toujeo  to 35 units in the morning, she is having occasional hypoglycemia in between meals and early morning.  No other complaints today.  REVIEW OF SYSTEMS As per history of present illness.   PAST MEDICAL HISTORY: Past Medical History:  Diagnosis Date   Anxiety    Dyslipidemia    Gastroparesis  due to DM [facility]) OCT 2014   75% AT 2 HRS, GLU >    H/O eye surgery 01/2018   Headache(784.0)    IDDM (insulin  dependent diabetes mellitus)    Neuropathy    Nicotine addiction    TIA (transient ischemic attack)    Dr. Joleen Navy    PAST SURGICAL HISTORY: Past Surgical History:  Procedure Laterality Date   BREAST BIOPSY Left 08/24/2013   benign fibrocystic changes with calcifications   Cataract surgery Bilateral 01/20/2017, 02/03/2017   COLONOSCOPY N/A 05/09/2016   Dr. Nolene Baumgarten: 2 hyperplastic polyps removed.  Next colonoscopy in 2027    ESOPHAGOGASTRODUODENOSCOPY N/A 08/31/2013   SLF: 1. Earky satiety nausea may be due to Gastroparesis/pyloric channel stenosis. 2. small hiatal hernia 3. Moderate erosive gastritis.    EYE SURGERY  01/2018   laser surgery bilateral eye      ALLERGIES: No Known Allergies  FAMILY HISTORY:  Family History  Problem Relation Age of Onset   Hypertension Mother    Diabetes Mother    Hyperlipidemia Mother    Rosacea Mother    Hypertension Father    Diabetes Brother    Breast cancer Maternal Aunt    Colon cancer Neg Hx     SOCIAL HISTORY: Social History   Socioeconomic History   Marital status: Single    Spouse name: Not on file   Number of children: Not on file   Years of education: Not on file   Highest education level: Not on file  Occupational History   Occupation: disabled  Tobacco Use   Smoking status: Former    Current packs/day: 0.00    Average packs/day: 1.5 packs/day for 24.0 years (36.0 ttl pk-yrs)    Types: Cigarettes    Start date: 08/24/1987    Quit date: 08/24/2011    Years since quitting: 12.6   Smokeless tobacco: Never  Vaping Use   Vaping status: Never Used  Substance and Sexual Activity   Alcohol use: No   Drug use: No   Sexual activity: Not Currently    Birth control/protection: None  Other Topics Concern   Not on file  Social History Narrative   Disabled, lives with a room mate   Social Drivers of Health   Financial Resource Strain: Low Risk  (04/24/2022)   Overall Financial Resource Strain (CARDIA)    Difficulty of Paying Living Expenses: Not hard at all  Food Insecurity: No Food Insecurity (04/24/2022)   Hunger Vital Sign    Worried About Running Out of Food in the Last Year: Never true    Ran Out of Food in the Last Year: Never true  Transportation Needs: No Transportation Needs (04/24/2022)   PRAPARE - Administrator, Civil Service (Medical): No    Lack of Transportation (Non-Medical): No  Physical Activity: Insufficiently  Active (04/29/2023)   Exercise Vital Sign    Days of Exercise per Week: 7 days    Minutes of Exercise per Session: 20 min  Stress: Stress Concern Present (04/24/2022)   [employer] of Occupational Health - Occupational Stress Questionnaire    Feeling of Stress : To some extent  Social Connections: Moderately Isolated (04/24/2022)   Social Connection and Isolation Panel    Frequency of Communication with Friends and Family: More than three times a week    Frequency of Social Gatherings with Friends and Family: More than three times a week    Attends Religious Services: More than 4 times per year  Active Member of Clubs or Organizations: No    Attends Banker Meetings: Never    Marital Status: Never married    MEDICATIONS:  Current Outpatient Medications  Medication Sig Dispense Refill   Blood Glucose Monitoring Suppl (ONETOUCH VERIO FLEX SYSTEM) w/Device KIT USE TO TEST BLOOD SUGAR TWO TIMES A DAY 1 kit 0   Blood Glucose Monitoring Suppl DEVI 1 each by Does not apply route in the morning, at noon, and at bedtime. May substitute to any manufacturer covered by patient's insurance. 1 each 0   chlorpheniramine  (CHLOR-TRIMETON ) 4 MG tablet Take one tablet by mouth once daily for 3 days, then as needed, for uncontrolled allergies and ear pressure 14 tablet 0   Continuous Blood Gluc Receiver (FREESTYLE LIBRE 3 READER) DEVI 1 Device by Does not apply route continuous. 1 each 0   Continuous Glucose Receiver (FREESTYLE LIBRE 3 READER) DEVI 1 Device by Does not apply route once for 1 dose. 1 each 0   Continuous Glucose Sensor (FREESTYLE LIBRE 3 PLUS SENSOR) MISC 1 each by Does not apply route continuous. Change every 15 days. 6 each 3   diazepam (VALIUM) 10 MG tablet as needed.      fluticasone  (FLONASE ) 50 MCG/ACT nasal spray USE TWO SPRAYS IN BOTH NOSTRILS DAILY 16 g 6   glucose blood (ONETOUCH VERIO) test strip USE AS BACKUP FOR SENSOR, ONCE A DAY AS NEEDED 50 each 2   insulin   glargine, 2 Unit Dial, (TOUJEO  MAX SOLOSTAR) 300 UNIT/ML Solostar Pen Take 30 units in the morning and 15 units at bedtime. 24 mL 4   Insulin  Pen Needle (SURE COMFORT PEN NEEDLES) 30G X 8 MM MISC USE FIVE TIMES A DAY 500 each 2   lisinopril  (ZESTRIL ) 2.5 MG tablet TAKE ONE (1) TABLET BY MOUTH EVERY DAY 90 tablet 1   metoCLOPramide  (REGLAN ) 5 MG tablet TAKE 1 TO 2 TABLETS BY MOUTH EVERY 6 HOURS AS NEEDED FOR NAUSEA OR VOMITING. 120 tablet 3   montelukast  (SINGULAIR ) 10 MG tablet TAKE ONE TABLET (10MG  TOTAL) BY MOUTH ATBEDTIME 90 tablet 1   Omega-3 Fatty Acids (FISH OIL) 1000 MG CAPS Take 1 capsule by mouth daily.     ondansetron  (ZOFRAN ) 4 MG tablet TAKE ONE TABLET BY MOUTH EVERY EIGHT HOURS AS NEEDED FOR NAUSEA OR VOMITING 30 tablet 3   ONETOUCH DELICA LANCETS 33G MISC USE FOUR TIMES A DAY AS DIRECTED. 100 each 1   pantoprazole  (PROTONIX ) 40 MG tablet TAKE ONE TABLET (40MG  TOTAL) BY MOUTH TWO TIMES DAILY BEFORE A MEAL ONCE SYMPTOMS CONTROLLED, CUT BACK TO ONCE DAILY IF TOLERATED 180 tablet 1   rosuvastatin  (CRESTOR ) 40 MG tablet TAKE ONE (1) TABLET BY MOUTH EVERY DAY 90 tablet 3   sertraline (ZOLOFT) 50 MG tablet Take 50 mg by mouth daily.     sodium chloride  (OCEAN) 0.65 % nasal spray Place 1 spray into the nose as needed for congestion. 30 mL 12   vitamin B-12 (CYANOCOBALAMIN) 100 MCG tablet Take 1 tablet by mouth daily.     VITAMIN D  PO Take 1 Dose by mouth daily.     Glucose Blood (BLOOD GLUCOSE TEST STRIPS) STRP 1 each by In Vitro route in the morning, at noon, and at bedtime. May substitute to any manufacturer covered by patient's insurance. 100 each 3   insulin  aspart (NOVOLOG  FLEXPEN) 100 UNIT/ML FlexPen 14-18 units  at meals three times a day, maximum 54 units/day. 30 mL 4   No current facility-administered medications  for this visit.    PHYSICAL EXAM: Vitals:   04/27/24 0950  BP: 122/60  Pulse: 75  Resp: 20  SpO2: 95%  Weight: 126 lb 12.8 oz (57.5 kg)  Height: 5' 7 (1.702 m)      Body mass index is 19.86 kg/m.  Wt Readings from Last 3 Encounters:  04/27/24 126 lb 12.8 oz (57.5 kg)  02/10/24 126 lb (57.2 kg)  01/22/24 125 lb 6.4 oz (56.9 kg)    General: Well developed, well nourished female in no apparent distress.  HEENT: AT/[place], no external lesions.  Eyes: Conjunctiva clear and no icterus. Neck: Neck supple  Lungs: Respirations not labored Neurologic: Alert, oriented, normal speech Extremities / Skin: Dry.  Psychiatric: Does not appear depressed or anxious  Diabetic Foot Exam - Simple   No data filed     LABS Reviewed Lab Results  Component Value Date   HGBA1C 7.4 (A) 04/27/2024   HGBA1C 8.3 (A) 01/22/2024   HGBA1C 8.7 (H) 10/16/2023   No results found for: FRUCTOSAMINE Lab Results  Component Value Date   CHOL 174 10/16/2023   HDL 83 10/16/2023   LDLCALC 77 10/16/2023   TRIG 76 10/16/2023   CHOLHDL 2.1 10/16/2023   Lab Results  Component Value Date   MICRALBCREAT 90 (H) 01/22/2024   MICRALBCREAT 5.4 03/11/2023   Lab Results  Component Value Date   CREATININE 1.12 (H) 02/10/2024   Lab Results  Component Value Date   GFR 61.24 07/10/2021    ASSESSMENT / PLAN  1. Uncontrolled type 1 diabetes mellitus with hyperglycemia (HCC)     Diabetes Mellitus type 1, complicated by diabetic retinopathy/neuropathy/gastroparesis. - Diabetic status / severity: uncontrolled.  Improving  Lab Results  Component Value Date   HGBA1C 7.4 (A) 04/27/2024    - Hemoglobin A1c goal : <6.5%  CGM data reviewed occasional hypoglycemia.  - Medications: See below.  I) decreased Toujeo  to 30 units in the morning and continue 15 units at bedtime.  She is currently taking Toujeo  35 units in the morning. II) adjust NovoLog  14 to 18 units with meals 3 times a day and adjust based on experience/meal size and carbohydrate content.  Discussed about okay to take up to 5 units of NovoLog  if hyperglycemia continues to after 2 hours of eating.  - Home  glucose testing: Freestyle libre 3+ and check as needed.    - Discussed/ Gave Hypoglycemia treatment plan.  Advised to use glucose tablet to correct hypoglycemia.  Glucagon  Emergency Kit prescribed in last visit.  # Consult : not required at this time.   # Annual urine for microalbuminuria/ creatinine ratio,+  microalbuminuria currently, continue ACE/ARB /lisinopril .  Last  Lab Results  Component Value Date   MICRALBCREAT 90 (H) 01/22/2024    # Foot check nightly / neuropathy.  Gastroparesis she has been on Reglan .  # She has proliferative diabetic retinopathy, following with retina specialist/ophthalmology..   - Diet: Make healthy diabetic food choices.  Advised not to miss/skip meals for prolonged hours. - Life style / activity / exercise: Discussed.  2. Blood pressure  -  BP Readings from Last 1 Encounters:  04/27/24 122/60    - Control is in target.  - No change in current plans.  3. Lipid status / Hyperlipidemia - Last  Lab Results  Component Value Date   LDLCALC 77 10/16/2023   - Continue rosuvastatin  40 mg daily.  Diagnoses and all orders for this visit:  Uncontrolled type 1 diabetes mellitus with hyperglycemia (  HCC) -     POCT glycosylated hemoglobin (Hb A1C) -     Glucose Blood (BLOOD GLUCOSE TEST STRIPS) STRP; 1 each by In Vitro route in the morning, at noon, and at bedtime. May substitute to any manufacturer covered by patient's insurance. -     insulin  aspart (NOVOLOG  FLEXPEN) 100 UNIT/ML FlexPen; 14-18 units  at meals three times a day, maximum 54 units/day.   DISPOSITION Follow up in clinic in 3  months suggested.     All questions answered and patient verbalized understanding of the plan.  [place] Virginia Curl, MD [facility] Endocrinology [facility] [address] [place], [address] [place], [place] [address] Phone # [phone]  At least part of this note was generated using voice recognition software. Inadvertent word errors may have occurred, which  were not recognized during the proofreading process.

## 2024-04-27 NOTE — Patient Instructions (Addendum)
 Latest Reference Range & Units 10/16/23 08:16 01/22/24 13:23 04/27/24 09:59  Hemoglobin A1C 4.0 - 5.6 % 8.7 (H) 8.3 ! Pend 7.4 !  (H): Data is abnormally high !: Data is abnormal  Stay on Toujeo  30 units and 15 units two times a day. Novolog  14 -18 units with meals 3 times a  day, adjust as instructed.

## 2024-05-03 ENCOUNTER — Ambulatory Visit (INDEPENDENT_AMBULATORY_CARE_PROVIDER_SITE_OTHER): Payer: PPO

## 2024-05-03 VITALS — Ht 67.0 in | Wt 126.1 lb

## 2024-05-03 DIAGNOSIS — Z0000 Encounter for general adult medical examination without abnormal findings: Secondary | ICD-10-CM | POA: Diagnosis not present

## 2024-05-03 NOTE — Progress Notes (Signed)
 Subjective:   Ashley Armstrong is a 58 y.o. who presents for a Medicare Wellness preventive visit.  As a reminder, Annual Wellness Visits don't include a physical exam, and some assessments may be limited, especially if this visit is performed virtually. We may recommend an in-person follow-up visit with your provider if needed.  Visit Complete: Virtual I connected with  [name] on 05/03/24 by a video and audio enabled telemedicine application and verified that I am speaking with the correct person using two identifiers.  Patient Location: Home  Provider Location: Office/Clinic  I discussed the limitations of evaluation and management by telemedicine. The patient expressed understanding and agreed to proceed.  Vital Signs: Because this visit was a virtual/telehealth visit, some criteria may be missing or patient reported. Any vitals not documented were not able to be obtained and vitals that have been documented are patient reported.  Persons Participating in Visit: Patient.  AWV Questionnaire: No: Patient Medicare AWV questionnaire was not completed prior to this visit.  Cardiac Risk Factors include: advanced age (>28men, >29 women);diabetes mellitus;dyslipidemia;hypertension     Objective:    Today's Vitals   05/03/24 1240  Weight: 126 lb 1.9 oz (57.2 kg)  Height: 5' 7 (1.702 m)   Body mass index is 19.75 kg/m.     05/03/2024   12:36 PM 04/29/2023    1:12 PM 04/24/2022   10:59 AM 05/18/2018    1:40 PM 03/11/2018    9:38 AM 02/12/2017    9:33 AM 11/05/2016    5:05 PM  Advanced Directives  Does Patient Have a Medical Advance Directive? No No No No  No  No  No   Would patient like information on creating a medical advance directive? Yes (MAU/Ambulatory/Procedural Areas - Information given) Yes (MAU/Ambulatory/Procedural Areas - Information given) No - Patient declined  No - Patient declined  Yes (MAU/Ambulatory/Procedural Areas - Information given)  No - Patient declined       Data saved with a previous flowsheet row definition    Current Medications (verified) Outpatient Encounter Medications as of 05/03/2024  Medication Sig   Blood Glucose Monitoring Suppl DEVI 1 each by Does not apply route in the morning, at noon, and at bedtime. May substitute to any manufacturer covered by patient's insurance.   chlorpheniramine  (CHLOR-TRIMETON ) 4 MG tablet Take one tablet by mouth once daily for 3 days, then as needed, for uncontrolled allergies and ear pressure   Continuous Blood Gluc Receiver (FREESTYLE LIBRE 3 READER) DEVI 1 Device by Does not apply route continuous.   Continuous Glucose Receiver (FREESTYLE LIBRE 3 READER) DEVI 1 Device by Does not apply route once for 1 dose.   Continuous Glucose Sensor (FREESTYLE LIBRE 3 PLUS SENSOR) MISC 1 each by Does not apply route continuous. Change every 15 days.   diazepam (VALIUM) 10 MG tablet as needed.    fluticasone  (FLONASE ) 50 MCG/ACT nasal spray USE TWO SPRAYS IN BOTH NOSTRILS DAILY   Glucose Blood (BLOOD GLUCOSE TEST STRIPS) STRP 1 each by In Vitro route in the morning, at noon, and at bedtime. May substitute to any manufacturer covered by patient's insurance.   glucose blood (ONETOUCH VERIO) test strip USE AS BACKUP FOR SENSOR, ONCE A DAY AS NEEDED   insulin  aspart (NOVOLOG  FLEXPEN) 100 UNIT/ML FlexPen 14-18 units  at meals three times a day, maximum 54 units/day.   insulin  glargine, 2 Unit Dial, (TOUJEO  MAX SOLOSTAR) 300 UNIT/ML Solostar Pen Take 30 units in the morning and 15 units  at bedtime.   Insulin  Pen Needle (SURE COMFORT PEN NEEDLES) 30G X 8 MM MISC USE FIVE TIMES A DAY   lisinopril  (ZESTRIL ) 2.5 MG tablet TAKE ONE (1) TABLET BY MOUTH EVERY DAY   metoCLOPramide  (REGLAN ) 5 MG tablet TAKE 1 TO 2 TABLETS BY MOUTH EVERY 6 HOURS AS NEEDED FOR NAUSEA OR VOMITING.   montelukast  (SINGULAIR ) 10 MG tablet TAKE ONE TABLET (10MG  TOTAL) BY MOUTH ATBEDTIME   Omega-3 Fatty Acids (FISH OIL) 1000 MG CAPS Take 1 capsule by mouth  daily.   ondansetron  (ZOFRAN ) 4 MG tablet TAKE ONE TABLET BY MOUTH EVERY EIGHT HOURS AS NEEDED FOR NAUSEA OR VOMITING   ONETOUCH DELICA LANCETS 33G MISC USE FOUR TIMES A DAY AS DIRECTED.   pantoprazole  (PROTONIX ) 40 MG tablet TAKE ONE TABLET (40MG  TOTAL) BY MOUTH TWO TIMES DAILY BEFORE A MEAL ONCE SYMPTOMS CONTROLLED, CUT BACK TO ONCE DAILY IF TOLERATED   rosuvastatin  (CRESTOR ) 40 MG tablet TAKE ONE (1) TABLET BY MOUTH EVERY DAY   sertraline (ZOLOFT) 50 MG tablet Take 50 mg by mouth daily.   sodium chloride  (OCEAN) 0.65 % nasal spray Place 1 spray into the nose as needed for congestion.   vitamin B-12 (CYANOCOBALAMIN) 100 MCG tablet Take 1 tablet by mouth daily.   VITAMIN D  PO Take 1 Dose by mouth daily.   Blood Glucose Monitoring Suppl (ONETOUCH VERIO FLEX SYSTEM) w/Device KIT USE TO TEST BLOOD SUGAR TWO TIMES A DAY (Patient not taking: Reported on 05/03/2024)   No facility-administered encounter medications on file as of 05/03/2024.    Allergies (verified) Patient has no known allergies.   History: Past Medical History:  Diagnosis Date   Anxiety    Dyslipidemia    Gastroparesis due to DM (HCC) OCT 2014   75% AT 2 HRS, GLU >    H/O eye surgery 01/2018   Headache(784.0)    IDDM (insulin  dependent diabetes mellitus)    Neuropathy    Nicotine addiction    TIA (transient ischemic attack)    Dr. Milton   Past Surgical History:  Procedure Laterality Date   BREAST BIOPSY Left 08/24/2013   benign fibrocystic changes with calcifications   Cataract surgery Bilateral 01/20/2017, 02/03/2017   COLONOSCOPY N/A 05/09/2016   Dr. Harvey: 2 hyperplastic polyps removed.  Next colonoscopy in 2027   ESOPHAGOGASTRODUODENOSCOPY N/A 08/31/2013   SLF: 1. Earky satiety nausea may be due to Gastroparesis/pyloric channel stenosis. 2. small hiatal hernia 3. Moderate erosive gastritis.    EYE SURGERY  01/2018   laser surgery bilateral eye     Family History  Problem Relation Age of Onset    Hypertension Mother    Diabetes Mother    Hyperlipidemia Mother    Rosacea Mother    Hypertension Father    Diabetes Brother    Breast cancer Maternal Aunt    Colon cancer Neg Hx    Social History   Socioeconomic History   Marital status: Single    Spouse name: Not on file   Number of children: Not on file   Years of education: Not on file   Highest education level: Not on file  Occupational History   Occupation: disabled  Tobacco Use   Smoking status: Former    Current packs/day: 0.00    Average packs/day: 1.5 packs/day for 24.0 years (36.0 ttl pk-yrs)    Types: Cigarettes    Start date: 08/24/1987    Quit date: 08/24/2011    Years since quitting: 12.7   Smokeless tobacco:  Never  Vaping Use   Vaping status: Never Used  Substance and Sexual Activity   Alcohol use: No   Drug use: No   Sexual activity: Not Currently    Birth control/protection: None  Other Topics Concern   Not on file  Social History Narrative   Disabled, lives with a room mate   Social Drivers of Health   Financial Resource Strain: Low Risk  (05/03/2024)   Overall Financial Resource Strain (CARDIA)    Difficulty of Paying Living Expenses: Not hard at all  Food Insecurity: No Food Insecurity (05/03/2024)   Hunger Vital Sign    Worried About Running Out of Food in the Last Year: Never true    Ran Out of Food in the Last Year: Never true  Transportation Needs: No Transportation Needs (05/03/2024)   PRAPARE - Administrator, Civil Service (Medical): No    Lack of Transportation (Non-Medical): No  Physical Activity: Insufficiently Active (05/03/2024)   Exercise Vital Sign    Days of Exercise per Week: 7 days    Minutes of Exercise per Session: 20 min  Stress: Stress Concern Present (05/03/2024)   [employer] of Occupational Health - Occupational Stress Questionnaire    Feeling of Stress: Rather much  Social Connections: Moderately Integrated (05/03/2024)   Social Connection and  Isolation Panel    Frequency of Communication with Friends and Family: More than three times a week    Frequency of Social Gatherings with Friends and Family: More than three times a week    Attends Religious Services: More than 4 times per year    Active Member of [employer] or Organizations: Yes    Attends Engineer, structural: More than 4 times per year    Marital Status: Never married    Tobacco Counseling Counseling given: Yes    Clinical Intake:  Pre-visit preparation completed: Yes  Pain : No/denies pain     BMI - recorded: 19.75 Nutritional Status: BMI of 19-24  Normal Nutritional Risks: None Diabetes: Yes CBG done?: No (telehealth visit.) Did pt. bring in CBG monitor from home?: No  Lab Results  Component Value Date   HGBA1C 7.4 (A) 04/27/2024   HGBA1C 8.3 (A) 01/22/2024   HGBA1C 8.7 (H) 10/16/2023     How often do you need to have someone help you when you read instructions, pamphlets, or other written materials from your doctor or pharmacy?: 1 - Never  Interpreter Needed?: No  Information entered by :: Stefano ORN CMA   Activities of Daily Living     05/03/2024   12:43 PM  In your present state of health, do you have any difficulty performing the following activities:  Hearing? 0  Vision? 0  Difficulty concentrating or making decisions? 0  Walking or climbing stairs? 1  Dressing or bathing? 0  Doing errands, shopping? 0  Preparing Food and eating ? N  Using the Toilet? N  In the past six months, have you accidently leaked urine? N  Do you have problems with loss of bowel control? N  Managing your Medications? N  Managing your Finances? N  Housekeeping or managing your Housekeeping? N    Patient Care Team: Antonetta Rollene BRAVO, MD as PCP - General (Family Medicine) Jarold Mayo, MD as Consulting Physician (Ophthalmology) Lavonia Lye, MD as Consulting Physician (Ophthalmology) Janit Thresa HERO, DPM as Consulting Physician (Podiatry) Thapa,  [place], MD as Consulting Physician (Endocrinology) [place], Awanda BIRCH, DPM as Consulting Physician (Podiatry) Ezzard Sonny RAMAN,  PA-C as Advice worker (Gastroenterology)  I have updated your Care Teams any recent Medical Services you may have received from other providers in the past year.     Assessment:   This is a routine wellness examination for Lynzy.  Hearing/Vision screen Hearing Screening - Comments:: Patient denies any hearing difficulties.   Vision Screening - Comments:: Patient wears reading glasses only. Up to date with yearly exams.  Patient sees Dr. Selinda Slocumb at [facility] in [place]     Goals Addressed             This Visit's Progress    Patient Stated       Get my A1C down        Depression Screen     05/03/2024   12:44 PM 01/15/2024   11:15 AM 09/11/2023    1:06 PM 04/29/2023    1:14 PM 03/27/2023    9:23 AM 12/03/2022    2:37 PM 11/21/2022    2:38 PM  PHQ 2/9 Scores  PHQ - 2 Score 0 2 2 2 3 3 2   PHQ- 9 Score 0 9 11  9 13 11     Fall Risk     05/03/2024   12:42 PM 01/15/2024   11:15 AM 09/11/2023    1:05 PM 04/29/2023    1:13 PM 03/27/2023    9:23 AM  Fall Risk   Falls in the past year? 0 0 0 0 0  Number falls in past yr: 0 0 0 0   Injury with Fall? 0 0 0  0  Risk for fall due to : No Fall Risks  No Fall Risks  No Fall Risks  Follow up Falls evaluation completed;Education provided;Falls prevention discussed  Falls evaluation completed  Falls evaluation completed    MEDICARE RISK AT HOME:  Medicare Risk at Home Any stairs in or around the home?: Yes If so, are there any without handrails?: No Home free of loose throw rugs in walkways, pet beds, electrical cords, etc?: Yes Adequate lighting in your home to reduce risk of falls?: Yes Life alert?: No Use of a cane, walker or w/c?: No Grab bars in the bathroom?: No Shower chair or bench in shower?: No Elevated toilet seat or a handicapped toilet?: Yes  TIMED UP AND  GO:  Was the test performed?  No  Cognitive Function: 6CIT completed    04/24/2022   11:00 AM  MMSE - Mini Mental State Exam  Not completed: Unable to complete        05/03/2024   12:44 PM 04/29/2023    1:14 PM 04/24/2022   11:01 AM 04/23/2021    2:57 PM 03/20/2020    2:54 PM  6CIT Screen  What Year? 0 points 0 points 0 points 0 points 0 points  What month? 0 points 0 points 0 points 0 points 0 points  What time? 0 points 0 points 0 points 0 points 0 points  Count back from 20 0 points 0 points 2 points 0 points 0 points  Months in reverse 0 points 0 points 4 points 0 points 0 points  Repeat phrase 0 points 0 points 0 points 0 points 0 points  Total Score 0 points 0 points 6 points 0 points 0 points    Immunizations Immunization History  Administered Date(s) Administered   H1N1 10/24/2008   Influenza Whole 09/24/2007, 08/09/2010   Influenza, Seasonal, Injecte, Preservative Fre 08/06/2023   Influenza,inj,Quad PF,6+ Mos 07/29/2013, 10/18/2014, 08/29/2015, 09/23/2016, 07/16/2017,  10/05/2018, 07/26/2019, 07/06/2020, 12/12/2021, 08/09/2022   Influenza-Unspecified 08/08/2017, 10/07/2018   Moderna Sars-Covid-2 Vaccination 03/01/2020, 04/04/2020, 11/25/2020   Pneumococcal Polysaccharide-23 03/19/2013, 06/02/2018   Td 04/11/2004   Tdap 06/16/2014   Zoster Recombinant(Shingrix) 05/08/2022, 07/10/2022    Screening Tests Health Maintenance  Topic Date Due   Pneumococcal Vaccine 41-30 Years old (2 of 2 - PCV) 01/14/2025 (Originally 06/03/2019)   INFLUENZA VACCINE  06/11/2024   DTaP/Tdap/Td (3 - Td or Tdap) 06/16/2024   MAMMOGRAM  09/07/2024   OPHTHALMOLOGY EXAM  09/18/2024   HEMOGLOBIN A1C  10/27/2024   Diabetic kidney evaluation - Urine ACR  01/21/2025   Diabetic kidney evaluation - eGFR measurement  02/09/2025   FOOT EXAM  03/05/2025   Medicare Annual Wellness (AWV)  05/03/2025   Colonoscopy  05/09/2026   Cervical Cancer Screening (HPV/Pap Cotest)  04/11/2027   Hepatitis C  Screening  Completed   HIV Screening  Completed   Zoster Vaccines- Shingrix  Completed   HPV VACCINES  Aged Out   Meningococcal B Vaccine  Aged Out   Lung Cancer Screening  Discontinued   COVID-19 Vaccine  Discontinued    Health Maintenance  There are no preventive care reminders to display for this patient. Health Maintenance Items Addressed: Patient is up to date  Additional Screening:  Vision Screening: Recommended annual ophthalmology exams for early detection of glaucoma and other disorders of the eye. Would you like a referral to an eye doctor? No    Dental Screening: Recommended annual dental exams for proper oral hygiene  Community Resource Referral / Chronic Care Management: CRR required this visit?  No   CCM required this visit?  No   Plan:    I have personally reviewed and noted the following in the patient's chart:   Medical and social history Use of alcohol, tobacco or illicit drugs  Current medications and supplements including opioid prescriptions. Patient is not currently taking opioid prescriptions. Functional ability and status Nutritional status Physical activity Advanced directives List of other physicians Hospitalizations, surgeries, and ER visits in previous 12 months Vitals Screenings to include cognitive, depression, and falls Referrals and appointments  In addition, I have reviewed and discussed with patient certain preventive protocols, quality metrics, and best practice recommendations. A written personalized care plan for preventive services as well as general preventive health recommendations were provided to patient.   Stiles Maxcy, CMA   05/03/2024   After Visit Summary: (MyChart) Due to this being a telephonic visit, the after visit summary with patients personalized plan was offered to patient via MyChart   Notes: Nothing significant to report at this time.

## 2024-05-03 NOTE — Patient Instructions (Signed)
 Ashley Armstrong ,  Thank you for taking time out of your busy schedule to complete your Annual Wellness Visit with me. I enjoyed our conversation and look forward to speaking with you again next year. I, as well as your care team,  appreciate your ongoing commitment to your health goals. Please review the following plan we discussed and let me know if I can assist you in the future.  I enjoyed our conversation and look forward to it again next year. Blessing for the upcoming year!!  -Manuela Halbur  Your Game plan/ To Do List    Follow up Visits: 1 Year Follow Up AWV: May 04, 2025 at 11:20 am video visit   Next appointment with PCP: May 06, 2024 at 10:00 am in office  Clinician Recommendations:  Aim for 30 minutes of exercise or brisk walking, 6-8 glasses of water , and 5 servings of fruits and vegetables each day.       This is a list of the screening recommended for you and due dates:  Health Maintenance  Topic Date Due   Pneumococcal Vaccination (2 of 2 - PCV) 01/14/2025*   Flu Shot  06/11/2024   DTaP/Tdap/Td vaccine (3 - Td or Tdap) 06/16/2024   Mammogram  09/07/2024   Eye exam for diabetics  09/18/2024   Hemoglobin A1C  10/27/2024   Yearly kidney health urinalysis for diabetes  01/21/2025   Yearly kidney function blood test for diabetes  02/09/2025   Complete foot exam   03/05/2025   Medicare Annual Wellness Visit  05/03/2025   Colon Cancer Screening  05/09/2026   Pap with HPV screening  04/11/2027   Hepatitis C Screening  Completed   HIV Screening  Completed   Zoster (Shingles) Vaccine  Completed   HPV Vaccine  Aged Out   Meningitis B Vaccine  Aged Out   Screening for Lung Cancer  Discontinued   COVID-19 Vaccine  Discontinued  *Topic was postponed. The date shown is not the original due date.    Advanced directives: (Provided) Advance directive discussed with you today. I have provided a copy for you to complete at home and have notarized. Once this is complete, please bring a copy in  to our office so we can scan it into your chart.  Advance Care Planning is important because it:  [x]  Makes sure you receive the medical care that is consistent with your values, goals, and preferences  [x]  It provides guidance to your family and loved ones and reduces their decisional burden about whether or not they are making the right decisions based on your wishes.  Follow the link provided in your after visit summary or read over the paperwork we have mailed to you to help you started getting your Advance Directives in place. If you need assistance in completing these, please reach out to us  so that we can help you!  Understanding Your Risk for Falls Millions of people have serious injuries from falls each year. It is important to understand your risk of falling. Talk with your health care provider about your risk and what you can do to lower it. If you do have a serious fall, make sure to tell your provider. Falling once raises your risk of falling again. How can falls affect me? Serious injuries from falls are common. These include: Broken bones, such as hip fractures. Head injuries, such as traumatic brain injuries (TBI) or concussions. A fear of falling can cause you to avoid activities and stay at home. This  can make your muscles weaker and raise your risk for a fall. What can increase my risk? There are a number of risk factors that increase your risk for falling. The more risk factors you have, the higher your risk of falling. Serious injuries from a fall happen most often to people who are older than 57 years old. Teenagers and young adults ages 4-29 are also at higher risk. Common risk factors include: Weakness in the lower body. Being generally weak or confused due to long-term (chronic) illness. Dizziness or balance problems. Poor vision. Medicines that cause dizziness or drowsiness. These may include: Medicines for your blood pressure, heart, anxiety, insomnia, or swelling  (edema). Pain medicines. Muscle relaxants. Other risk factors include: Drinking alcohol. Having had a fall in the past. Having foot pain or wearing improper footwear. Working at a dangerous job. Having any of the following in your home: Tripping hazards, such as floor clutter or loose rugs. Poor lighting. Pets. Having dementia or memory loss. What actions can I take to lower my risk of falling?     Physical activity Stay physically fit. Do strength and balance exercises. Consider taking a regular class to build strength and balance. Yoga and tai chi are good options. Vision Have your eyes checked every year and your prescription for glasses or contacts updated as needed. Shoes and walking aids Wear non-skid shoes. Wear shoes that have rubber soles and low heels. Do not wear high heels. Do not walk around the house in socks or slippers. Use a cane or walker as told by your provider. Home safety Attach secure railings on both sides of your stairs. Install grab bars for your bathtub, shower, and toilet. Use a non-skid mat in your bathtub or shower. Attach bath mats securely with double-sided, non-slip rug tape. Use good lighting in all rooms. Keep a flashlight near your bed. Make sure there is a clear path from your bed to the bathroom. Use night-lights. Do not use throw rugs. Make sure all carpeting is taped or tacked down securely. Remove all clutter from walkways and stairways, including extension cords. Repair uneven or broken steps and floors. Avoid walking on icy or slippery surfaces. Walk on the grass instead of on icy or slick sidewalks. Use ice melter to get rid of ice on walkways in the winter. Use a cordless phone. Questions to ask your health care provider Can you help me check my risk for a fall? Do any of my medicines make me more likely to fall? Should I take a vitamin D  supplement? What exercises can I do to improve my strength and balance? Should I make an  appointment to have my vision checked? Do I need a bone density test to check for weak bones (osteoporosis)? Would it help to use a cane or a walker? Where to find more information Centers for Disease Control and Prevention, STEADI: TonerPromos.no Community-Based Fall Prevention Programs: TonerPromos.no [employer] on Aging: BaseRingTones.pl Contact a health care provider if: You fall at home. You are afraid of falling at home. You feel weak, drowsy, or dizzy. This information is not intended to replace advice given to you by your health care provider. Make sure you discuss any questions you have with your health care provider. Document Revised: 07/01/2022 Document Reviewed: 07/01/2022 Elsevier Patient Education  2024 [employer].

## 2024-05-04 ENCOUNTER — Other Ambulatory Visit: Payer: Self-pay | Admitting: Family Medicine

## 2024-05-04 ENCOUNTER — Other Ambulatory Visit: Payer: Self-pay

## 2024-05-04 DIAGNOSIS — Z0189 Encounter for other specified special examinations: Secondary | ICD-10-CM

## 2024-05-04 DIAGNOSIS — E559 Vitamin D deficiency, unspecified: Secondary | ICD-10-CM | POA: Diagnosis not present

## 2024-05-04 DIAGNOSIS — E1140 Type 2 diabetes mellitus with diabetic neuropathy, unspecified: Secondary | ICD-10-CM | POA: Diagnosis not present

## 2024-05-04 DIAGNOSIS — E785 Hyperlipidemia, unspecified: Secondary | ICD-10-CM | POA: Diagnosis not present

## 2024-05-04 DIAGNOSIS — Z794 Long term (current) use of insulin: Secondary | ICD-10-CM | POA: Diagnosis not present

## 2024-05-06 ENCOUNTER — Ambulatory Visit: Payer: Self-pay | Admitting: Family Medicine

## 2024-05-06 ENCOUNTER — Encounter: Admitting: Family Medicine

## 2024-05-06 ENCOUNTER — Other Ambulatory Visit: Payer: Self-pay

## 2024-05-06 ENCOUNTER — Telehealth: Payer: Self-pay

## 2024-05-06 DIAGNOSIS — J019 Acute sinusitis, unspecified: Secondary | ICD-10-CM

## 2024-05-06 LAB — LIPID PANEL
Chol/HDL Ratio: 2 ratio (ref 0.0–4.4)
Cholesterol, Total: 159 mg/dL (ref 100–199)
HDL: 80 mg/dL (ref >39)
LDL Chol Calc (NIH): 68 mg/dL (ref 0–99)
Triglycerides: 54 mg/dL (ref 0–149)
VLDL Cholesterol Cal: 11 mg/dL (ref 5–40)

## 2024-05-06 LAB — MICROALBUMIN / CREATININE URINE RATIO
Creatinine, Urine: 45.5 mg/dL
Microalb/Creat Ratio: 49 mg/g{creat} — ABNORMAL HIGH (ref 0–29)
Microalbumin, Urine: 22.4 ug/mL

## 2024-05-06 LAB — CMP14+EGFR
ALT: 20 IU/L (ref 0–32)
AST: 28 IU/L (ref 0–40)
Albumin: 4 g/dL (ref 3.8–4.9)
Alkaline Phosphatase: 89 IU/L (ref 44–121)
BUN/Creatinine Ratio: 11 (ref 9–23)
BUN: 12 mg/dL (ref 6–24)
Bilirubin Total: 0.3 mg/dL (ref 0.0–1.2)
CO2: 20 mmol/L (ref 20–29)
Calcium: 9.6 mg/dL (ref 8.7–10.2)
Chloride: 105 mmol/L (ref 96–106)
Creatinine, Ser: 1.11 mg/dL — ABNORMAL HIGH (ref 0.57–1.00)
Globulin, Total: 2.4 g/dL (ref 1.5–4.5)
Glucose: 108 mg/dL — ABNORMAL HIGH (ref 70–99)
Potassium: 4.6 mmol/L (ref 3.5–5.2)
Sodium: 141 mmol/L (ref 134–144)
Total Protein: 6.4 g/dL (ref 6.0–8.5)
eGFR: 58 mL/min/{1.73_m2} — ABNORMAL LOW (ref >59)

## 2024-05-06 LAB — CBC WITH DIFFERENTIAL/PLATELET
Basophils Absolute: 0.1 10*3/uL (ref 0.0–0.2)
Basos: 1 %
EOS (ABSOLUTE): 0.2 10*3/uL (ref 0.0–0.4)
Eos: 3 %
Hematocrit: 39.6 % (ref 34.0–46.6)
Hemoglobin: 12.7 g/dL (ref 11.1–15.9)
Immature Grans (Abs): 0 10*3/uL (ref 0.0–0.1)
Immature Granulocytes: 0 %
Lymphocytes Absolute: 1.7 10*3/uL (ref 0.7–3.1)
Lymphs: 28 %
MCH: 29.1 pg (ref 26.6–33.0)
MCHC: 32.1 g/dL (ref 31.5–35.7)
MCV: 91 fL (ref 79–97)
Monocytes Absolute: 0.5 10*3/uL (ref 0.1–0.9)
Monocytes: 8 %
Neutrophils Absolute: 3.7 10*3/uL (ref 1.4–7.0)
Neutrophils: 60 %
Platelets: 220 10*3/uL (ref 150–450)
RBC: 4.36 x10E6/uL (ref 3.77–5.28)
RDW: 12.6 % (ref 11.7–15.4)
WBC: 6 10*3/uL (ref 3.4–10.8)

## 2024-05-06 LAB — VITAMIN D 25 HYDROXY (VIT D DEFICIENCY, FRACTURES): Vit D, 25-Hydroxy: 73 ng/mL (ref 30.0–100.0)

## 2024-05-06 LAB — TSH: TSH: 3.47 u[IU]/mL (ref 0.450–4.500)

## 2024-05-06 MED ORDER — FLUTICASONE PROPIONATE 50 MCG/ACT NA SUSP: 2.0000 | Freq: Every day | NASAL | 3 refills | Status: AC

## 2024-05-06 NOTE — Telephone Encounter (Signed)
 Sent!

## 2024-05-06 NOTE — Telephone Encounter (Signed)
 Received fax from [facility] on behalf of pt, requesting refill of Fluticasone  Propionate 90mcg/act Nasal. Last filled by outside provider. Please let me know if appropriate

## 2024-05-13 ENCOUNTER — Encounter: Payer: Self-pay | Admitting: Family Medicine

## 2024-05-13 ENCOUNTER — Telehealth: Payer: Self-pay | Admitting: Family Medicine

## 2024-05-13 ENCOUNTER — Ambulatory Visit (INDEPENDENT_AMBULATORY_CARE_PROVIDER_SITE_OTHER): Admitting: Family Medicine

## 2024-05-13 VITALS — BP 120/71 | HR 78 | Resp 16 | Ht 67.0 in | Wt 126.1 lb

## 2024-05-13 DIAGNOSIS — Z0001 Encounter for general adult medical examination with abnormal findings: Secondary | ICD-10-CM | POA: Diagnosis not present

## 2024-05-13 DIAGNOSIS — Z23 Encounter for immunization: Secondary | ICD-10-CM

## 2024-05-13 NOTE — Patient Instructions (Addendum)
 F/U in 6 months, call if you need me sooner  Nurse visit for Hep B #2 in 2 months, and for #3 in 6 months  Pneumonia 20 today and Hep B #1   eXCELLENT labs and exam   Please schedule your mammogram which is due 10/29 or after  All the best , continued improvement in blood sugar control  Thanks for choosing [facility], we consider it a privelige to serve you.

## 2024-05-13 NOTE — Telephone Encounter (Signed)
 Called patient for diabetic eye screening, patient goes to Dr Jarold in [place]

## 2024-05-13 NOTE — Telephone Encounter (Signed)
Documented in chart.

## 2024-05-13 NOTE — Progress Notes (Signed)
    JACKALYNN ART     MRN: [id number]      DOB: 1966-07-29  Chief Complaint  Patient presents with   Annual Exam    cpe    HPI: Patient is in for annual physical exam. No other health concerns are expressed or addressed at the visit. Recent labs,  are reviewed. Immunization is reviewed , and  updated if needed.   PE: BP 120/71   Pulse 78   Resp 16   Ht 5' 7 (1.702 m)   Wt 126 lb 1.3 oz (57.2 kg)   LMP 07/19/2013   SpO2 98%   BMI 19.75 kg/m   Pleasant  female, alert and oriented x 3, in no cardio-pulmonary distress. Afebrile. HEENT No facial trauma or asymetry. Sinuses non tender.  Extra occullar muscles intact.. External ears normal, . Neck: supple, no adenopathy,JVD or thyromegaly.No bruits.  Chest: Clear to ascultation bilaterally.No crackles or wheezes. Non tender to palpation  Cardiovascular system; Heart sounds normal,  S1 and  S2 ,no S3.  No murmur, or thrill. Apical beat not displaced Peripheral pulses normal.  Abdomen: Soft, non tender, no organomegaly or masses.  No guarding, tenderness or rebound.     Musculoskeletal exam: Full ROM of spine, hips , shoulders and knees. No deformity ,swelling or crepitus noted. No muscle wasting or atrophy.   Neurologic: Cranial nerves 2 to 12 intact. Power, tone ,sensation and reflexes normal throughout. No disturbance in gait. No tremor.  Skin: Intact, no ulceration, erythema , scaling or rash noted. Pigmentation normal throughout  Psych; Normal mood and affect. Judgement and concentration normal   Assessment & Plan:  Immunization due After obtaining informed consent, the  Hep B #1 vaccine is  administered , with no adverse effect noted at the time of administration.   Encounter for immunization After obtaining informed consent, the Pneumonia 20  vaccine is  administered , with no adverse effect noted at the time of administration.   Annual visit for general adult medical examination with  abnormal findings Annual exam as documented. Counseling done  re healthy lifestyle involving commitment to 150 minutes exercise per week, heart healthy diet, and attaining healthy weight.The importance of adequate sleep also discussed. Immunization and cancer screening needs are specifically addressed at this visit.

## 2024-05-16 ENCOUNTER — Encounter: Payer: Self-pay | Admitting: Family Medicine

## 2024-05-16 DIAGNOSIS — Z23 Encounter for immunization: Secondary | ICD-10-CM | POA: Insufficient documentation

## 2024-05-16 NOTE — Assessment & Plan Note (Signed)
 After obtaining informed consent, the Hep B #1 vaccine is  administered , with no adverse effect noted at the time of administration.

## 2024-05-16 NOTE — Assessment & Plan Note (Signed)
 After obtaining informed consent, the  Pneumonia 20 vaccine is  administered , with no adverse effect noted at the time of administration.

## 2024-05-16 NOTE — Assessment & Plan Note (Signed)
 Annual exam as documented. Counseling done  re healthy lifestyle involving commitment to 150 minutes exercise per week, heart healthy diet, and attaining healthy weight.The importance of adequate sleep also discussed.  Immunization and cancer screening needs are specifically addressed at this visit.

## 2024-05-25 ENCOUNTER — Other Ambulatory Visit: Payer: Self-pay

## 2024-05-25 MED ORDER — LANCETS 33G MISC: 3 refills | Status: DC

## 2024-06-28 ENCOUNTER — Ambulatory Visit: Admitting: Podiatry

## 2024-07-06 DIAGNOSIS — F411 Generalized anxiety disorder: Secondary | ICD-10-CM | POA: Diagnosis not present

## 2024-07-20 ENCOUNTER — Ambulatory Visit (INDEPENDENT_AMBULATORY_CARE_PROVIDER_SITE_OTHER)

## 2024-07-20 ENCOUNTER — Ambulatory Visit: Admitting: Podiatry

## 2024-07-20 DIAGNOSIS — Z23 Encounter for immunization: Secondary | ICD-10-CM | POA: Diagnosis not present

## 2024-07-20 NOTE — Progress Notes (Signed)
 Patient is in office today for a nurse visit for Immunization of #2 Hep B. Patient Injection was given in the  Left deltoid. Patient tolerated injection well.

## 2024-07-22 ENCOUNTER — Encounter: Payer: Self-pay | Admitting: Gastroenterology

## 2024-07-26 ENCOUNTER — Ambulatory Visit (INDEPENDENT_AMBULATORY_CARE_PROVIDER_SITE_OTHER)

## 2024-07-26 DIAGNOSIS — Z23 Encounter for immunization: Secondary | ICD-10-CM | POA: Diagnosis not present

## 2024-07-26 NOTE — Progress Notes (Signed)
 Patient is in office today for a nurse visit for Immunization. Patient Injection was given in the  Left arm. Patient tolerated injection well.

## 2024-07-27 DIAGNOSIS — E103593 Type 1 diabetes mellitus with proliferative diabetic retinopathy without macular edema, bilateral: Secondary | ICD-10-CM | POA: Diagnosis not present

## 2024-07-27 DIAGNOSIS — H43821 Vitreomacular adhesion, right eye: Secondary | ICD-10-CM | POA: Diagnosis not present

## 2024-07-27 DIAGNOSIS — H4321 Crystalline deposits in vitreous body, right eye: Secondary | ICD-10-CM | POA: Diagnosis not present

## 2024-07-27 DIAGNOSIS — H43391 Other vitreous opacities, right eye: Secondary | ICD-10-CM | POA: Diagnosis not present

## 2024-07-27 DIAGNOSIS — H35371 Puckering of macula, right eye: Secondary | ICD-10-CM | POA: Diagnosis not present

## 2024-07-27 DIAGNOSIS — H31093 Other chorioretinal scars, bilateral: Secondary | ICD-10-CM | POA: Diagnosis not present

## 2024-08-04 ENCOUNTER — Other Ambulatory Visit: Payer: Self-pay | Admitting: Gastroenterology

## 2024-08-05 ENCOUNTER — Ambulatory Visit: Admitting: Endocrinology

## 2024-08-24 ENCOUNTER — Ambulatory Visit: Admitting: Podiatry

## 2024-08-24 ENCOUNTER — Encounter: Payer: Self-pay | Admitting: Podiatry

## 2024-08-24 DIAGNOSIS — B351 Tinea unguium: Secondary | ICD-10-CM | POA: Diagnosis not present

## 2024-08-24 DIAGNOSIS — M79675 Pain in left toe(s): Secondary | ICD-10-CM

## 2024-08-24 DIAGNOSIS — M722 Plantar fascial fibromatosis: Secondary | ICD-10-CM

## 2024-08-24 DIAGNOSIS — M79674 Pain in right toe(s): Secondary | ICD-10-CM | POA: Diagnosis not present

## 2024-08-24 NOTE — Patient Instructions (Signed)

## 2024-08-24 NOTE — Progress Notes (Signed)
 Subjective:  Patient ID: Ashley Armstrong, female    DOB: 02-21-1966,  MRN: [id number]  Ashley Armstrong presents to clinic today for:  Chief Complaint  Patient presents with   Diabetes    [facility] IDDM A1C 7.4. Toenail trim. Right foot heel pain x 2 weeks. 6 pain.    Patient notes nails are thick, discolored, elongated and painful in shoegear when trying to ambulate.    She is also noting right plantar fasciitis.  She has had this in the past.  She is requesting a cortisone injection today.  She had tried regular power step inserts from our office in the past and states that they were not helpful.  She has been using an ice cup and rolling her arch on this at home which helps a little.  Denies injury  PCP is Antonetta Rollene BRAVO, MD.  Past Medical History:  Diagnosis Date   Anxiety    Dyslipidemia    Gastroparesis due to DM (HCC) 08/2013   75% AT 2 HRS, GLU >    H/O eye surgery 01/2018   Headache(784.0)    IDDM (insulin  dependent diabetes mellitus)    Neuropathy    Nicotine addiction    Rectal bleeding 09/29/2020   TIA (transient ischemic attack)    Dr. Milton   Past Surgical History:  Procedure Laterality Date   BREAST BIOPSY Left 08/24/2013   benign fibrocystic changes with calcifications   Cataract surgery Bilateral 01/20/2017, 02/03/2017   COLONOSCOPY N/A 05/09/2016   Dr. Harvey: 2 hyperplastic polyps removed.  Next colonoscopy in 2027   ESOPHAGOGASTRODUODENOSCOPY N/A 08/31/2013   SLF: 1. Earky satiety nausea may be due to Gastroparesis/pyloric channel stenosis. 2. small hiatal hernia 3. Moderate erosive gastritis.    EYE SURGERY  01/2018   laser surgery bilateral eye     No Known Allergies  Review of Systems: Negative except as noted in the HPI.  Objective:  Ashley Armstrong is a pleasant 58 y.o. female in NAD. AAO x 3.  Vascular Examination: Capillary refill time is 3-5 seconds to toes bilateral. Palpable pedal pulses b/l LE. Digital hair present b/l.  Skin temperature  gradient WNL b/l. No varicosities b/l. No cyanosis noted b/l.   Dermatological Examination: Pedal skin with normal turgor, texture and tone b/l. No open wounds. No interdigital macerations b/l. Toenails x10 are 3mm thick, discolored, dystrophic with subungual debris. There is pain with compression of the nail plates.  They are elongated x10  Orthopedic examination: Pain on palpation plantar medial and plantar central portions of the right heel.  No gaps or nodules within the plantar fascia.  Negative Tinel's sign of the posterior tibial nerve.  No Achilles pain.     Latest Ref Rng & Units 04/27/2024    9:59 AM 01/22/2024    1:23 PM 10/16/2023    8:16 AM  Hemoglobin A1C  Hemoglobin-A1c 4.0 - 5.6 % 7.4  8.3  8.7    Assessment/Plan: 1. Pain due to onychomycosis of toenails of both feet   2. Plantar fasciitis of right foot    The mycotic toenails were sharply debrided x10 with sterile nail nippers and a power debriding burr to decrease bulk/thickness and length.    With the patient's consent, a corticosteroid injection was administered to the plantar right heel consisting of a mixture of 1% lidocaine plain, 0.5% Marcaine plain and Kenalog  10 for total of 1.25 cc administered.  She tolerated this well and a Band-Aid was applied.  She  was encouraged to go online and purchase the power step high arch arch supports for her shoes.  She had tried the regular arch height inserts in the past and states that they felt that they were not supportive enough.  She does have a higher arch and these newer styles to accommodate that type of foot may be better for her.  Return in about 3 months (around 11/24/2024) for [facility]   Awanda CHARM Imperial, DPM, FACFAS [facility]     [address] [place], [place] [address]                Office [phone]  Fax [phone]

## 2024-09-09 ENCOUNTER — Encounter: Payer: Self-pay | Admitting: Endocrinology

## 2024-09-09 ENCOUNTER — Ambulatory Visit: Payer: Self-pay | Admitting: Endocrinology

## 2024-09-09 ENCOUNTER — Ambulatory Visit: Admitting: Endocrinology

## 2024-09-09 VITALS — BP 132/82 | HR 84 | Ht 67.0 in | Wt 127.1 lb

## 2024-09-09 DIAGNOSIS — E108 Type 1 diabetes mellitus with unspecified complications: Secondary | ICD-10-CM | POA: Diagnosis not present

## 2024-09-09 DIAGNOSIS — E103599 Type 1 diabetes mellitus with proliferative diabetic retinopathy without macular edema, unspecified eye: Secondary | ICD-10-CM | POA: Diagnosis not present

## 2024-09-09 DIAGNOSIS — E1065 Type 1 diabetes mellitus with hyperglycemia: Secondary | ICD-10-CM | POA: Diagnosis not present

## 2024-09-09 LAB — POCT GLYCOSYLATED HEMOGLOBIN (HGB A1C): Hemoglobin A1C: 7.3 % — AB (ref 4.0–5.6)

## 2024-09-09 MED ORDER — NOVOLOG FLEXPEN 100 UNIT/ML ~~LOC~~ SOPN: PEN_INJECTOR | SUBCUTANEOUS | 4 refills | Status: DC

## 2024-09-09 MED ORDER — FREESTYLE LIBRE 3 PLUS SENSOR MISC: 1.0000 | 3 refills | Status: AC

## 2024-09-09 MED ORDER — LANCETS 33G MISC: 3 refills | Status: AC

## 2024-09-09 MED ORDER — SURE COMFORT PEN NEEDLES 30G X 8 MM MISC: 2 refills | Status: AC

## 2024-09-09 MED ORDER — ONETOUCH VERIO VI STRP: ORAL_STRIP | 2 refills | Status: DC

## 2024-09-09 MED ORDER — FREESTYLE LIBRE 3 READER DEVI: 1.0000 | Freq: Once | 0 refills | Status: AC

## 2024-09-09 MED ORDER — TOUJEO MAX SOLOSTAR 300 UNIT/ML ~~LOC~~ SOPN: PEN_INJECTOR | SUBCUTANEOUS | 4 refills | Status: DC

## 2024-09-09 NOTE — Progress Notes (Signed)
 Outpatient Endocrinology Note Ashley Daily, Ashley Armstrong  09/09/24  Patient's Name: Ashley Armstrong    DOB: Jan 22, 1966    MRN: [id number]                                                    REASON OF VISIT: Follow up for type 1 diabetes mellitus  PCP: Antonetta Rollene BRAVO, Ashley Armstrong  HISTORY OF PRESENT ILLNESS:   Ashley Armstrong is a 58 y.o. old female with past medical history listed below, is here for follow up for type 1 diabetes mellitus.   Pertinent Diabetes History: Patient was previously seen by Dr. Von and was last time seen in July 2024.  Patient was diagnosed with type 1 diabetes mellitus at the age of 43.  She has uncontrolled type 1 diabetes mellitus with hemoglobin A1c mostly in the range of 8 to 10%.  Chronic Diabetes Complications : Retinopathy: Proliferative diabetic retinopathy bilaterally. Last ophthalmology exam was done on 09/2023, following with ophthalmology / retina specialist regularly.  Nephropathy: no /lisinopril . Peripheral neuropathy: yes Coronary artery disease: no.  She has atherosclerosis on CT scan. Stroke: no  GASTROPARESIS: Has symptoms of fullness and nausea, will take Zofran  as needed  Gastric emptying study showed significant abnormality in 2014 Followed by gastroenterologist  She is on Reglan  before meals, taking 5 mg tid and occasionally may take an extra pill for nausea No episodes of vomiting but occasionally may have fullness    Relevant comorbidities and cardiovascular risk factors: Obesity: no Body mass index is 19.91 kg/m.  Hypertension: Yes  Hyperlipidemia : Yes, on statin   Current / Home Diabetic regimen includes:  INSULIN  regimen is: TOUJEO  35 units a.m. -10 units p.m.  NovoLog  at mealtimes mostly 14-18 units  with meals.   Prior diabetic medications: Lantus  in the past.  She declined insulin  pump in the past.  Glycemic data:    CONTINUOUS GLUCOSE MONITORING SYSTEM (CGMS) INTERPRETATION: At today's visit, we reviewed CGM downloads. The full  report is scanned in the media. Reviewing the CGM trends, blood glucose are as follows:  FreeStyle Libre 2 CGM-  Sensor Download (Sensor download was reviewed and summarized below.) Dates: October 17 to September 09, 2024, 14 days  Glucose Management Indicator: 7.2%  Sensor usage at 96%     Interpretation: Mostly acceptable blood sugar with rare hyperglycemia with blood sugar over 300 range and usually hyperglycemic in the low 200s related to meals and sometimes related to correcting for hypoglycemia.  Occasional hypoglycemia with blood sugar 50 to 60s range overnight and in between the meals seems to be related to use of mealtime insulin .    Hypoglycemia: Patient has minor hypoglycemic episodes. Patient has hypoglycemia awareness.  Factors modifying glucose control: 1.  Diabetic diet assessment: 2-3 meals a day however different timing at variable times.  2.  Staying active or exercising: No formal exercise.  3.  Medication compliance: compliant most of the time.  Interval history  Hemoglobin A1c today 7.3%.  Diabetes regimen as reviewed and noted above.  Patient reports she had a cortisone injection into her foot for plantar fasciitis on October 14.  CGM data as reviewed above.  No other complaints today.  REVIEW OF SYSTEMS As per history of present illness.   PAST MEDICAL HISTORY: Past Medical History:  Diagnosis Date   Anxiety  Dyslipidemia    Gastroparesis due to DM (HCC) 08/2013   75% AT 2 HRS, GLU >    H/O eye surgery 01/2018   Headache(784.0)    IDDM (insulin  dependent diabetes mellitus)    Neuropathy    Nicotine addiction    Rectal bleeding 09/29/2020   TIA (transient ischemic attack)    Dr. Milton    PAST SURGICAL HISTORY: Past Surgical History:  Procedure Laterality Date   BREAST BIOPSY Left 08/24/2013   benign fibrocystic changes with calcifications   Cataract surgery Bilateral 01/20/2017, 02/03/2017   COLONOSCOPY N/A 05/09/2016   Dr. Harvey: 2  hyperplastic polyps removed.  Next colonoscopy in 2027   ESOPHAGOGASTRODUODENOSCOPY N/A 08/31/2013   SLF: 1. Earky satiety nausea may be due to Gastroparesis/pyloric channel stenosis. 2. small hiatal hernia 3. Moderate erosive gastritis.    EYE SURGERY  01/2018   laser surgery bilateral eye      ALLERGIES: No Known Allergies  FAMILY HISTORY:  Family History  Problem Relation Age of Onset   Hypertension Mother    Diabetes Mother    Hyperlipidemia Mother    Rosacea Mother    Hypertension Father    Diabetes Brother    Breast cancer Maternal Aunt    Colon cancer Neg Hx     SOCIAL HISTORY: Social History   Socioeconomic History   Marital status: Single    Spouse name: Not on file   Number of children: Not on file   Years of education: Not on file   Highest education level: Not on file  Occupational History   Occupation: disabled  Tobacco Use   Smoking status: Former    Current packs/day: 0.00    Average packs/day: 1.5 packs/day for 24.0 years (36.0 ttl pk-yrs)    Types: Cigarettes    Start date: 08/24/1987    Quit date: 08/24/2011    Years since quitting: 13.0   Smokeless tobacco: Never  Vaping Use   Vaping status: Never Used  Substance and Sexual Activity   Alcohol use: No   Drug use: No   Sexual activity: Not Currently    Birth control/protection: None  Other Topics Concern   Not on file  Social History Narrative   Disabled, lives with a room mate   Social Drivers of Health   Financial Resource Strain: Low Risk  (05/03/2024)   Overall Financial Resource Strain (CARDIA)    Difficulty of Paying Living Expenses: Not hard at all  Food Insecurity: No Food Insecurity (05/03/2024)   Hunger Vital Sign    Worried About Running Out of Food in the Last Year: Never true    Ran Out of Food in the Last Year: Never true  Transportation Needs: No Transportation Needs (05/03/2024)   PRAPARE - Administrator, Civil Service (Medical): No    Lack of Transportation  (Non-Medical): No  Physical Activity: Insufficiently Active (05/03/2024)   Exercise Vital Sign    Days of Exercise per Week: 7 days    Minutes of Exercise per Session: 20 min  Stress: Stress Concern Present (05/03/2024)   [employer] of Occupational Health - Occupational Stress Questionnaire    Feeling of Stress: Rather much  Social Connections: Moderately Integrated (05/03/2024)   Social Connection and Isolation Panel    Frequency of Communication with Friends and Family: More than three times a week    Frequency of Social Gatherings with Friends and Family: More than three times a week    Attends Religious Services: More than  4 times per year    Active Member of Clubs or Organizations: Yes    Attends Engineer, Structural: More than 4 times per year    Marital Status: Never married    MEDICATIONS:  Current Outpatient Medications  Medication Sig Dispense Refill   Blood Glucose Monitoring Suppl (ONETOUCH VERIO FLEX SYSTEM) w/Device KIT USE TO TEST BLOOD SUGAR TWO TIMES A DAY 1 kit 0   Blood Glucose Monitoring Suppl DEVI 1 each by Does not apply route in the morning, at noon, and at bedtime. May substitute to any manufacturer covered by patient's insurance. 1 each 0   chlorpheniramine  (CHLOR-TRIMETON ) 4 MG tablet Take one tablet by mouth once Armstrong for 3 days, then as needed, for uncontrolled allergies and ear pressure 14 tablet 0   Continuous Blood Gluc Receiver (FREESTYLE LIBRE 3 READER) DEVI 1 Device by Does not apply route continuous. 1 each 0   diazepam (VALIUM) 10 MG tablet as needed.      fluticasone  (FLONASE ) 50 MCG/ACT nasal spray Place 2 sprays into both nostrils Armstrong. 16 g 3   lisinopril  (ZESTRIL ) 2.5 MG tablet TAKE ONE (1) TABLET BY MOUTH EVERY DAY 90 tablet 1   metoCLOPramide  (REGLAN ) 5 MG tablet TAKE 1 TO 2 TABLETS BY MOUTH EVERY 6 HOURS AS NEEDED FOR NAUSEA OR VOMITING. 120 tablet 3   montelukast  (SINGULAIR ) 10 MG tablet TAKE ONE TABLET (10MG  TOTAL) BY MOUTH  ATBEDTIME 90 tablet 1   Omega-3 Fatty Acids (FISH OIL) 1000 MG CAPS Take 1 capsule by mouth Armstrong.     ondansetron  (ZOFRAN ) 4 MG tablet TAKE ONE TABLET BY MOUTH EVERY EIGHT HOURS AS NEEDED FOR NAUSEA OR VOMITING 30 tablet 3   ONETOUCH DELICA LANCETS 33G MISC USE FOUR TIMES A DAY AS DIRECTED. 100 each 1   pantoprazole  (PROTONIX ) 40 MG tablet TAKE ONE TABLET (40MG  TOTAL) BY MOUTH TWO TIMES Armstrong BEFORE A MEAL ONCE SYMPTOMS CONTROLLED, CUT BACK TO ONCE Armstrong IF TOLERATED 180 tablet 1   rosuvastatin  (CRESTOR ) 40 MG tablet TAKE ONE (1) TABLET BY MOUTH EVERY DAY 90 tablet 3   sertraline (ZOLOFT) 50 MG tablet Take 50 mg by mouth Armstrong.     sodium chloride  (OCEAN) 0.65 % nasal spray Place 1 spray into the nose as needed for congestion. 30 mL 12   vitamin B-12 (CYANOCOBALAMIN) 100 MCG tablet Take 1 tablet by mouth Armstrong.     VITAMIN D  PO Take 1 Dose by mouth Armstrong.     Continuous Glucose Receiver (FREESTYLE LIBRE 3 READER) DEVI 1 Device by Does not apply route once for 1 dose. 1 each 0   Continuous Glucose Sensor (FREESTYLE LIBRE 3 PLUS SENSOR) MISC 1 each by Does not apply route continuous. Change every 15 days. 6 each 3   glucose blood (ONETOUCH VERIO) test strip USE AS BACKUP FOR SENSOR, ONCE A DAY AS NEEDED 100 each 2   insulin  aspart (NOVOLOG  FLEXPEN) 100 UNIT/ML FlexPen 10-15 units  at meals three times a day, maximum 45 units/day. 30 mL 4   insulin  glargine, 2 Unit Dial, (TOUJEO  MAX SOLOSTAR) 300 UNIT/ML Solostar Pen Take 34 units in the morning and 10 units at bedtime. 24 mL 4   Insulin  Pen Needle (SURE COMFORT PEN NEEDLES) 30G X 8 MM MISC USE FIVE TIMES A DAY 500 each 2   Lancets 33G MISC Use to check blood sugars three times Armstrong. 100 each 3   No current facility-administered medications for this visit.    PHYSICAL  EXAM: Vitals:   09/09/24 1042  BP: 132/82  Pulse: 84  SpO2: 99%  Weight: 127 lb 2 oz (57.7 kg)  Height: 5' 7 (1.702 m)      Body mass index is 19.91 kg/m.  Wt Readings  from Last 3 Encounters:  09/09/24 127 lb 2 oz (57.7 kg)  05/13/24 126 lb 1.3 oz (57.2 kg)  05/03/24 126 lb 1.9 oz (57.2 kg)    General: Well developed, well nourished female in no apparent distress.  HEENT: AT/[place], no external lesions.  Eyes: Conjunctiva clear and no icterus. Neck: Neck supple  Lungs: Respirations not labored Neurologic: Alert, oriented, normal speech Extremities / Skin: Dry.  Psychiatric: Does not appear depressed or anxious  Diabetic Foot Exam - Simple   No data filed     LABS Reviewed Lab Results  Component Value Date   HGBA1C 7.3 (A) 09/09/2024   HGBA1C 7.4 (A) 04/27/2024   HGBA1C 8.3 (A) 01/22/2024   No results found for: FRUCTOSAMINE Lab Results  Component Value Date   CHOL 159 05/04/2024   HDL 80 05/04/2024   LDLCALC 68 05/04/2024   TRIG 54 05/04/2024   CHOLHDL 2.0 05/04/2024   Lab Results  Component Value Date   MICRALBCREAT 49 (H) 05/04/2024   MICRALBCREAT 90 (H) 01/22/2024   Lab Results  Component Value Date   CREATININE 1.11 (H) 05/04/2024   Lab Results  Component Value Date   GFR 61.24 07/10/2021    ASSESSMENT / PLAN  1. Uncontrolled type 1 diabetes mellitus with hyperglycemia (HCC)   2. Type 1 diabetes mellitus with complications (HCC)   3. Proliferative diabetic retinopathy associated with type 1 diabetes mellitus, unspecified laterality, unspecified proliferative retinopathy type (HCC)     Diabetes Mellitus type 1, complicated by diabetic retinopathy/neuropathy/gastroparesis. - Diabetic status / severity: uncontrolled.  Improving  Lab Results  Component Value Date   HGBA1C 7.3 (A) 09/09/2024    - Hemoglobin A1c goal : <6.5%  Hemoglobin A1c 7.3% today.  CGM data reviewed occasional hypoglycemia.  - Medications: See below.  I) continue Toujeo  34 units in the morning and continue 10 units at bedtime. II) decrease NovoLog  from 14 to 18 units with meals 3 times a day to 10 to 15 units and adjust based on  experience/meal size and carbohydrate content.   - Home glucose testing: Freestyle libre 3+ and check as needed.    - Discussed/ Gave Hypoglycemia treatment plan.  Advised to use glucose tablet to correct hypoglycemia.  Glucagon  Emergency Kit prescribed in prior visit.  # Consult : not required at this time.   # Annual urine for microalbuminuria/ creatinine ratio,+  microalbuminuria currently, continue ACE/ARB /lisinopril .  Last  Lab Results  Component Value Date   MICRALBCREAT 49 (H) 05/04/2024    # Foot check nightly / neuropathy.  Gastroparesis she has been on Reglan .  # She has proliferative diabetic retinopathy, following with retina specialist/ophthalmology..   - Diet: Make healthy diabetic food choices.  Advised not to miss/skip meals for prolonged hours. - Life style / activity / exercise: Discussed.  2. Blood pressure  -  BP Readings from Last 1 Encounters:  09/09/24 132/82    - Control is in target.  - No change in current plans.  3. Lipid status / Hyperlipidemia - Last  Lab Results  Component Value Date   LDLCALC 68 05/04/2024   - Continue rosuvastatin  40 mg Armstrong.  Diagnoses and all orders for this visit:  Uncontrolled type 1 diabetes mellitus  with hyperglycemia (HCC) -     POCT HgB A1C -     Continuous Glucose Sensor (FREESTYLE LIBRE 3 PLUS SENSOR) MISC; 1 each by Does not apply route continuous. Change every 15 days. -     Continuous Glucose Receiver (FREESTYLE LIBRE 3 READER) DEVI; 1 Device by Does not apply route once for 1 dose. -     insulin  glargine, 2 Unit Dial, (TOUJEO  MAX SOLOSTAR) 300 UNIT/ML Solostar Pen; Take 34 units in the morning and 10 units at bedtime. -     insulin  aspart (NOVOLOG  FLEXPEN) 100 UNIT/ML FlexPen; 10-15 units  at meals three times a day, maximum 45 units/day. -     Lancets 33G MISC; Use to check blood sugars three times Armstrong.  Type 1 diabetes mellitus with complications (HCC) -     Insulin  Pen Needle (SURE COMFORT PEN  NEEDLES) 30G X 8 MM MISC; USE FIVE TIMES A DAY  Proliferative diabetic retinopathy associated with type 1 diabetes mellitus, unspecified laterality, unspecified proliferative retinopathy type (HCC) -     glucose blood (ONETOUCH VERIO) test strip; USE AS BACKUP FOR SENSOR, ONCE A DAY AS NEEDED   DISPOSITION Follow up in clinic in 3  months suggested.     All questions answered and patient verbalized understanding of the plan.  Treonna Klee, Ashley Armstrong [facility] Endocrinology [facility] [address] [place], [address] [place], [place] [address] Phone # [phone]  At least part of this note was generated using voice recognition software. Inadvertent word errors may have occurred, which were not recognized during the proofreading process.

## 2024-09-28 DIAGNOSIS — F411 Generalized anxiety disorder: Secondary | ICD-10-CM | POA: Diagnosis not present

## 2024-09-30 ENCOUNTER — Ambulatory Visit: Payer: Self-pay | Admitting: *Deleted

## 2024-09-30 ENCOUNTER — Ambulatory Visit: Payer: Self-pay | Admitting: Internal Medicine

## 2024-09-30 NOTE — Telephone Encounter (Signed)
 FYI Only or Action Required?: FYI only for provider: appointment scheduled on 09/30/24 and patient requesting VV appt .  Patient was last seen in primary care on 05/13/2024 by Antonetta Rollene BRAVO, MD.  Called Nurse Triage reporting Cough.  Symptoms began a week ago.  Interventions attempted: OTC medications: dayquil and Rest, hydration, or home remedies.  Symptoms are: gradually worsening.  Triage Disposition: See PCP When Office is Open (Within 3 Days) 1-3 days  Patient/caregiver understands and will follow disposition?: Yes    Please advise if patient can keep appt as VV . Sx runny nose, non productive cough , body aches, sneezing, mild ear pain started today. No chest pain no difficulty breathing no fever.            Copied from CRM [id number]. Topic: Clinical - Red Word Triage >> Sep 30, 2024 10:42 AM Donee H wrote: [facility] that prompted transfer to Nurse Triage: Patient states experiencing pain in ears. She states she has pressure in head, runny nose, sneezing and coughing. It has been going on for about a week now. She also stated it started with her head first then went to ears. She would like to come in to see pcp. Reason for Disposition  Cough has been present for > 3 weeks    Less than 3 weeks , approx 1 week  Answer Assessment - Initial Assessment Questions No available appt with PCP . Patient requesting VV. VV appt scheduled with other PCP today  due to no chest pain no difficulty breathing no productive cough. Ear pain just started and is mild.      1. ONSET: When did the cough begin?      1 week ago  2. SEVERITY: How bad is the cough today?      Getting worse 3. SPUTUM: Describe the color of your sputum (e.g., none, dry cough; clear, white, yellow, green)     Non productive  4. HEMOPTYSIS: Are you coughing up any blood? If Yes, ask: How much? (e.g., flecks, streaks, tablespoons, etc.)     na 5. DIFFICULTY BREATHING: Are you having difficulty  breathing? If Yes, ask: How bad is it? (e.g., mild, moderate, severe)      no 6. FEVER: Do you have a fever? If Yes, ask: What is your temperature, how was it measured, and when did it start?     Chills 3- 4 times this week  7. CARDIAC HISTORY: Do you have any history of heart disease? (e.g., heart attack, congestive heart failure)      Hx DM  8. LUNG HISTORY: Do you have any history of lung disease?  (e.g., pulmonary embolus, asthma, emphysema)     Na  9. PE RISK FACTORS: Do you have a history of blood clots? (or: recent major surgery, recent prolonged travel, bedridden)     na 10. OTHER SYMPTOMS: Do you have any other symptoms? (e.g., runny nose, wheezing, chest pain)       No chest pain  no difficulty breathing , sneezing , runny nose like faucet esp standing , sinus pain pressure, ear pain  mild but starting , neck pain, body aches.   11. PREGNANCY: Is there any chance you are pregnant? When was your last menstrual period?       na 12. TRAVEL: Have you traveled out of the country in the last month? (e.g., travel history, exposures)       na  Protocols used: Cough - Acute Non-Productive-A-AH

## 2024-10-16 ENCOUNTER — Other Ambulatory Visit: Payer: Self-pay | Admitting: Endocrinology

## 2024-10-16 DIAGNOSIS — E103599 Type 1 diabetes mellitus with proliferative diabetic retinopathy without macular edema, unspecified eye: Secondary | ICD-10-CM

## 2024-10-18 ENCOUNTER — Telehealth: Payer: Self-pay | Admitting: Endocrinology

## 2024-10-18 MED ORDER — ONETOUCH ULTRA VI STRP: ORAL_STRIP | 2 refills | Status: DC

## 2024-10-18 NOTE — Telephone Encounter (Signed)
 Resent Ultra strips to local pharmacy as requested. Unsure as to why it was not the one she wanted however it was the correct order.

## 2024-10-18 NOTE — Addendum Note (Signed)
 Addended by: Dorisann Schwanke M on: 10/18/2024 10:12 AM   Modules accepted: Orders

## 2024-10-18 NOTE — Telephone Encounter (Signed)
 Patient is calling to say that the wrong test strips were sent in to her pharmacy.  Patient states that she needs the [name] test strip [[id number]] sent in to   [facility] - [place], [place] - [address] (Ph: [phone])

## 2024-11-02 ENCOUNTER — Other Ambulatory Visit: Payer: Self-pay | Admitting: Gastroenterology

## 2024-11-02 ENCOUNTER — Other Ambulatory Visit: Payer: Self-pay | Admitting: Family Medicine

## 2024-11-18 ENCOUNTER — Ambulatory Visit (INDEPENDENT_AMBULATORY_CARE_PROVIDER_SITE_OTHER): Admitting: Family Medicine

## 2024-11-18 ENCOUNTER — Encounter: Payer: Self-pay | Admitting: Family Medicine

## 2024-11-18 VITALS — BP 124/72 | HR 87 | Resp 16 | Ht 67.0 in | Wt 126.0 lb

## 2024-11-18 DIAGNOSIS — E1140 Type 2 diabetes mellitus with diabetic neuropathy, unspecified: Secondary | ICD-10-CM | POA: Diagnosis not present

## 2024-11-18 DIAGNOSIS — Z23 Encounter for immunization: Secondary | ICD-10-CM | POA: Diagnosis not present

## 2024-11-18 DIAGNOSIS — F322 Major depressive disorder, single episode, severe without psychotic features: Secondary | ICD-10-CM | POA: Diagnosis not present

## 2024-11-18 DIAGNOSIS — E559 Vitamin D deficiency, unspecified: Secondary | ICD-10-CM

## 2024-11-18 DIAGNOSIS — Z794 Long term (current) use of insulin: Secondary | ICD-10-CM

## 2024-11-18 DIAGNOSIS — E785 Hyperlipidemia, unspecified: Secondary | ICD-10-CM | POA: Diagnosis not present

## 2024-11-18 DIAGNOSIS — Z1329 Encounter for screening for other suspected endocrine disorder: Secondary | ICD-10-CM | POA: Diagnosis not present

## 2024-11-18 DIAGNOSIS — Z9189 Other specified personal risk factors, not elsewhere classified: Secondary | ICD-10-CM | POA: Insufficient documentation

## 2024-11-18 NOTE — Patient Instructions (Addendum)
 Annual exam end August  Hep B #3  Please get TdAP at your pharmacy , this is past due  Discuss dose of your zoloft with your Provider as your screen today suggests  that you are still not adequately treated  Please schedule and get your mammogram as we discussed  You are referred for Cardiac calcium  score test as we discussed  Fasting liid, cmp and eGFr in next 1 week please  CBC, fasting lipid, cmp and EgFR, TSH and vit D and urine ACR 1 weeek before August appointment  It is important that you exercise regularly at least 30 minutes 5 times a week. If you develop chest pain, have severe difficulty breathing, or feel very tired, stop exercising immediately and seek medical attention   Thanks for choosing  Primary Care, we consider it a privelige to serve you.

## 2024-11-18 NOTE — Assessment & Plan Note (Signed)
 Recommend dose inc in zoloft , will d/w Psych, not suicidal or homicidal

## 2024-11-18 NOTE — Assessment & Plan Note (Signed)
 Updated lab needed at/ before next visit.

## 2024-11-18 NOTE — Progress Notes (Signed)
 "  Ashley Armstrong     MRN: [id number]      DOB: 02-16-1966  Chief Complaint  Patient presents with   Medical Management of Chronic Issues    6 month follow up     HPI Ashley Armstrong is here for follow up and re-evaluation of chronic medical conditions, medication management and review of any available recent lab and radiology data.  Preventive health is updated, specifically  Cancer screening and Immunization.   Questions or concerns regarding consultations or procedures which the PT has had in the interim are  addressed. The PT denies any adverse reactions to current medications since the last visit.  There are no new concerns.  There are no specific complaints  Blood sugar is improved Denies polyuria, polydipsia, blurred vision , or hypoglycemic episodes.   ROS Denies recent fever or chills. Denies sinus pressure, nasal congestion, ear pain or sore throat. Denies chest congestion, productive cough or wheezing. Denies chest pains, palpitations and leg swelling Denies abdominal pain, nausea, vomiting,diarrhea or constipation.   Denies dysuria, frequency, hesitancy or incontinence. Denies joint pain, swelling and limitation in mobility. Denies headaches, seizures, numbness, or tingling. C/o  depression and  anxiety denies  insomnia. Denies skin break down or rash.   PE  BP 124/72   Pulse 87   Resp 16   Ht 5' 7 (1.702 m)   Wt 126 lb (57.2 kg)   LMP 07/19/2013   SpO2 98%   BMI 19.73 kg/m   Patient alert and oriented and in no cardiopulmonary distress.  HEENT: No facial asymmetry, EOMI,     Neck supple .  Chest: Clear to auscultation bilaterally.  CVS: S1, S2 no murmurs, no S3.Regular rate.  ABD: Soft non tender.   Ext: No edema  MS: Adequate ROM spine, shoulders, hips and knees.  Skin: Intact, no ulcerations or rash noted.  Psych: Good eye contact, normal affect. Memory intact not anxious or depressed appearing.  CNS: CN 2-12 intact, power,  normal throughout.no  focal deficits noted.   Assessment & Plan  Depression, major, single episode, severe (HCC) Recommend dose inc in zoloft , will d/w Psych, not suicidal or homicidal  Immunization due After obtaining informed consent, the Hep B vaccine is  administered , with no adverse effect noted at the time of administration.   Hyperlipidemia LDL goal <100 Hyperlipidemia:Low fat diet discussed and encouraged.   Lipid Panel  Lab Results  Component Value Date   CHOL 159 05/04/2024   HDL 80 05/04/2024   LDLCALC 68 05/04/2024   TRIG 54 05/04/2024   CHOLHDL 2.0 05/04/2024     Controlled, no change in medication Updated lab needed at/ before next visit.   Type 2 diabetes mellitus with diabetic neuropathy, unspecified (HCC) Diabetes associated with hypertension, hyperlipidemia, and depression  Ashley Armstrong is reminded of the importance of commitment to daily physical activity for 30 minutes or more, as able and the need to limit carbohydrate intake to 30 to 60 grams per meal to help with blood sugar control.   The need to take medication as prescribed, test blood sugar as directed, and to call between visits if there is a concern that blood sugar is uncontrolled is also discussed.   Ashley Armstrong is reminded of the importance of daily foot exam, annual eye examination, and good blood sugar, blood pressure and cholesterol control.     Latest Ref Rng & Units 09/09/2024   11:16 AM 05/04/2024    8:06 AM 04/27/2024  9:59 AM 02/10/2024   10:25 AM 01/22/2024    1:39 PM  Diabetic Labs  HbA1c 4.0 - 5.6 % 7.3   7.4     Microalbumin mg/dL     9.9   Micro/Creat Ratio 0 - 29 mg/g creat  49    90   Chol 100 - 199 mg/dL  840      HDL >60 mg/dL  80      Calc LDL 0 - 99 mg/dL  68      Triglycerides 0 - 149 mg/dL  54      Creatinine 9.42 - 1.00 mg/dL  8.88   8.87        06/11/7972   10:01 AM 09/09/2024   10:42 AM 05/13/2024   10:18 AM 05/03/2024   12:40 PM 04/27/2024    9:50 AM 02/10/2024    8:56 AM 02/10/2024     8:54 AM  BP/Weight  Systolic BP 124 132 120 -- 122 136 145  Diastolic BP 72 82 71 -- 60 73 76  Wt. (Lbs) 126 127.13 126.08 126.12 126.8  126  BMI 19.73 kg/m2 19.91 kg/m2 19.75 kg/m2 19.75 kg/m2 19.86 kg/m2  19.73 kg/m2      Latest Ref Rng & Units 09/19/2023   12:00 AM 11/21/2022    2:20 PM  Foot/eye exam completion dates  Eye Exam No Retinopathy Retinopathy       Foot Form Completion   Done     This result is from an external source.      Improved managed by Endo  Vitamin D  deficiency Updated lab needed at/ before next visit.  "

## 2024-11-18 NOTE — Assessment & Plan Note (Signed)
 Hyperlipidemia:Low fat diet discussed and encouraged.   Lipid Panel  Lab Results  Component Value Date   CHOL 159 05/04/2024   HDL 80 05/04/2024   LDLCALC 68 05/04/2024   TRIG 54 05/04/2024   CHOLHDL 2.0 05/04/2024     Controlled, no change in medication Updated lab needed at/ before next visit.

## 2024-11-18 NOTE — Assessment & Plan Note (Signed)
 After obtaining informed consent, the Hep B vaccine is  administered , with no adverse effect noted at the time of administration.

## 2024-11-18 NOTE — Assessment & Plan Note (Signed)
 Diabetes associated with hypertension, hyperlipidemia, and depression  Ashley Armstrong is reminded of the importance of commitment to daily physical activity for 30 minutes or more, as able and the need to limit carbohydrate intake to 30 to 60 grams per meal to help with blood sugar control.   The need to take medication as prescribed, test blood sugar as directed, and to call between visits if there is a concern that blood sugar is uncontrolled is also discussed.   Ashley Armstrong is reminded of the importance of daily foot exam, annual eye examination, and good blood sugar, blood pressure and cholesterol control.     Latest Ref Rng & Units 09/09/2024   11:16 AM 05/04/2024    8:06 AM 04/27/2024    9:59 AM 02/10/2024   10:25 AM 01/22/2024    1:39 PM  Diabetic Labs  HbA1c 4.0 - 5.6 % 7.3   7.4     Microalbumin mg/dL     9.9   Micro/Creat Ratio 0 - 29 mg/g creat  49    90   Chol 100 - 199 mg/dL  840      HDL >60 mg/dL  80      Calc LDL 0 - 99 mg/dL  68      Triglycerides 0 - 149 mg/dL  54      Creatinine 9.42 - 1.00 mg/dL  8.88   8.87        06/11/7972   10:01 AM 09/09/2024   10:42 AM 05/13/2024   10:18 AM 05/03/2024   12:40 PM 04/27/2024    9:50 AM 02/10/2024    8:56 AM 02/10/2024    8:54 AM  BP/Weight  Systolic BP 124 132 120 -- 122 136 145  Diastolic BP 72 82 71 -- 60 73 76  Wt. (Lbs) 126 127.13 126.08 126.12 126.8  126  BMI 19.73 kg/m2 19.91 kg/m2 19.75 kg/m2 19.75 kg/m2 19.86 kg/m2  19.73 kg/m2      Latest Ref Rng & Units 09/19/2023   12:00 AM 11/21/2022    2:20 PM  Foot/eye exam completion dates  Eye Exam No Retinopathy Retinopathy       Foot Form Completion   Done     This result is from an external source.      Improved managed by Endo

## 2024-11-22 ENCOUNTER — Telehealth: Payer: Self-pay | Admitting: Endocrinology

## 2024-11-22 NOTE — Telephone Encounter (Signed)
 Patient is calling stating that per her pharmacy her insurance is only covering Accu Chek products.  Patient states that she needs prescriptions sent in for:  1)  Accu Check Aviva Glucose Meter  2)  Accu Check Lancets  3)  Accu Chek Test Strips  Patient states that she uses   [facility] - [place], [place] - [address] (Ph: [phone])

## 2024-11-23 ENCOUNTER — Other Ambulatory Visit: Payer: Self-pay

## 2024-11-23 ENCOUNTER — Ambulatory Visit: Admitting: Podiatry

## 2024-11-23 DIAGNOSIS — E1140 Type 2 diabetes mellitus with diabetic neuropathy, unspecified: Secondary | ICD-10-CM

## 2024-11-23 MED ORDER — ACCU-CHEK GUIDE W/DEVICE KIT: PACK | 0 refills | Status: DC

## 2024-11-23 MED ORDER — ACCU-CHEK GUIDE TEST VI STRP: ORAL_STRIP | 12 refills | Status: DC

## 2024-11-23 MED ORDER — ACCU-CHEK SOFTCLIX LANCETS MISC: 12 refills | Status: DC

## 2024-11-23 MED ORDER — ACCU-CHEK SOFTCLIX LANCETS MISC: 12 refills | Status: AC

## 2024-11-23 MED ORDER — ACCU-CHEK GUIDE W/DEVICE KIT: PACK | 0 refills | Status: AC

## 2024-11-23 MED ORDER — ACCU-CHEK GUIDE TEST VI STRP: ORAL_STRIP | 12 refills | Status: AC

## 2024-11-30 LAB — OPHTHALMOLOGY REPORT-SCANNED

## 2024-12-08 ENCOUNTER — Other Ambulatory Visit (HOSPITAL_COMMUNITY)

## 2024-12-14 ENCOUNTER — Telehealth: Admitting: Endocrinology

## 2024-12-14 ENCOUNTER — Encounter: Payer: Self-pay | Admitting: Endocrinology

## 2024-12-14 DIAGNOSIS — E1065 Type 1 diabetes mellitus with hyperglycemia: Secondary | ICD-10-CM

## 2024-12-14 MED ORDER — NOVOLOG FLEXPEN 100 UNIT/ML ~~LOC~~ SOPN: PEN_INJECTOR | SUBCUTANEOUS | 4 refills | Status: AC

## 2024-12-14 MED ORDER — TOUJEO MAX SOLOSTAR 300 UNIT/ML ~~LOC~~ SOPN: PEN_INJECTOR | SUBCUTANEOUS | 4 refills | Status: AC

## 2025-03-29 ENCOUNTER — Ambulatory Visit: Admitting: Endocrinology

## 2025-05-04 ENCOUNTER — Ambulatory Visit

## 2025-06-14 ENCOUNTER — Encounter: Payer: Self-pay | Admitting: Family Medicine
